# Patient Record
Sex: Female | Born: 1955
Health system: Southern US, Community
[De-identification: ages and names within clinical notes are randomized; demographics above are authoritative.]

## PROBLEM LIST (undated history)

## (undated) DIAGNOSIS — Z0000 Encounter for general adult medical examination without abnormal findings: Secondary | ICD-10-CM

## (undated) DIAGNOSIS — M1990 Unspecified osteoarthritis, unspecified site: Secondary | ICD-10-CM

## (undated) DIAGNOSIS — T4145XA Adverse effect of unspecified anesthetic, initial encounter: Secondary | ICD-10-CM

## (undated) DIAGNOSIS — E785 Hyperlipidemia, unspecified: Secondary | ICD-10-CM

## (undated) DIAGNOSIS — M797 Fibromyalgia: Secondary | ICD-10-CM

## (undated) DIAGNOSIS — K219 Gastro-esophageal reflux disease without esophagitis: Secondary | ICD-10-CM

## (undated) DIAGNOSIS — F32A Depression, unspecified: Secondary | ICD-10-CM

## (undated) DIAGNOSIS — M5430 Sciatica, unspecified side: Secondary | ICD-10-CM

## (undated) DIAGNOSIS — F329 Major depressive disorder, single episode, unspecified: Secondary | ICD-10-CM

## (undated) DIAGNOSIS — K649 Unspecified hemorrhoids: Secondary | ICD-10-CM

## (undated) DIAGNOSIS — I10 Essential (primary) hypertension: Secondary | ICD-10-CM

## (undated) DIAGNOSIS — T8859XA Other complications of anesthesia, initial encounter: Secondary | ICD-10-CM

## (undated) DIAGNOSIS — Z8719 Personal history of other diseases of the digestive system: Secondary | ICD-10-CM

## (undated) DIAGNOSIS — K760 Fatty (change of) liver, not elsewhere classified: Secondary | ICD-10-CM

## (undated) HISTORY — DX: Encounter for general adult medical examination without abnormal findings: Z00.00

## (undated) HISTORY — DX: Major depressive disorder, single episode, unspecified: F32.9

## (undated) HISTORY — PX: SPINE SURGERY: SHX786

## (undated) HISTORY — DX: Essential (primary) hypertension: I10

## (undated) HISTORY — DX: Fibromyalgia: M79.7

## (undated) HISTORY — PX: TONSILLECTOMY: SUR1361

## (undated) HISTORY — DX: Other complications of anesthesia, initial encounter: T88.59XA

## (undated) HISTORY — DX: Unspecified osteoarthritis, unspecified site: M19.90

## (undated) HISTORY — DX: Adverse effect of unspecified anesthetic, initial encounter: T41.45XA

## (undated) HISTORY — DX: Depression, unspecified: F32.A

## (undated) HISTORY — DX: Unspecified hemorrhoids: K64.9

## (undated) HISTORY — PX: ABDOMINAL HYSTERECTOMY: SHX81

## (undated) HISTORY — DX: Sciatica, unspecified side: M54.30

## (undated) HISTORY — DX: Hyperlipidemia, unspecified: E78.5

## (undated) HISTORY — PX: APPENDECTOMY: SHX54

## (undated) HISTORY — PX: RECTAL PROLAPSE REPAIR: SHX759

---

## 1978-06-08 HISTORY — PX: OVARIAN CYST SURGERY: SHX726

## 1985-06-08 HISTORY — PX: TOTAL ABDOMINAL HYSTERECTOMY: SHX209

## 1985-06-08 HISTORY — PX: OTHER SURGICAL HISTORY: SHX169

## 1995-06-09 HISTORY — PX: LUMBAR FUSION: SHX111

## 2000-10-07 ENCOUNTER — Encounter: Admission: RE | Admit: 2000-10-07 | Discharge: 2000-10-07 | Payer: Self-pay | Admitting: Neurosurgery

## 2000-10-07 ENCOUNTER — Encounter: Payer: Self-pay | Admitting: Neurosurgery

## 2000-10-26 ENCOUNTER — Encounter: Payer: Self-pay | Admitting: Neurosurgery

## 2000-10-26 ENCOUNTER — Encounter: Admission: RE | Admit: 2000-10-26 | Discharge: 2000-10-26 | Payer: Self-pay | Admitting: Neurosurgery

## 2000-11-09 ENCOUNTER — Encounter: Admission: RE | Admit: 2000-11-09 | Discharge: 2000-11-09 | Payer: Self-pay | Admitting: Neurosurgery

## 2000-11-09 ENCOUNTER — Encounter: Payer: Self-pay | Admitting: Neurosurgery

## 2000-11-21 ENCOUNTER — Ambulatory Visit (HOSPITAL_COMMUNITY): Admission: RE | Admit: 2000-11-21 | Discharge: 2000-11-21 | Payer: Self-pay | Admitting: Neurosurgery

## 2000-11-21 ENCOUNTER — Encounter: Payer: Self-pay | Admitting: Neurosurgery

## 2003-03-26 ENCOUNTER — Encounter: Payer: Self-pay | Admitting: Neurosurgery

## 2003-03-26 ENCOUNTER — Encounter: Admission: RE | Admit: 2003-03-26 | Discharge: 2003-03-26 | Payer: Self-pay | Admitting: Neurosurgery

## 2003-04-24 ENCOUNTER — Inpatient Hospital Stay (HOSPITAL_COMMUNITY): Admission: RE | Admit: 2003-04-24 | Discharge: 2003-04-27 | Payer: Self-pay | Admitting: Neurosurgery

## 2003-08-26 ENCOUNTER — Ambulatory Visit (HOSPITAL_COMMUNITY): Admission: RE | Admit: 2003-08-26 | Discharge: 2003-08-26 | Payer: Self-pay | Admitting: Neurosurgery

## 2003-10-09 ENCOUNTER — Inpatient Hospital Stay (HOSPITAL_COMMUNITY): Admission: RE | Admit: 2003-10-09 | Discharge: 2003-10-10 | Payer: Self-pay | Admitting: Neurosurgery

## 2004-03-31 ENCOUNTER — Emergency Department (HOSPITAL_COMMUNITY): Admission: EM | Admit: 2004-03-31 | Discharge: 2004-03-31 | Payer: Self-pay | Admitting: Family Medicine

## 2004-04-14 ENCOUNTER — Ambulatory Visit: Payer: Self-pay | Admitting: Internal Medicine

## 2004-04-21 ENCOUNTER — Ambulatory Visit: Payer: Self-pay | Admitting: Internal Medicine

## 2004-06-10 ENCOUNTER — Ambulatory Visit: Payer: Self-pay | Admitting: Internal Medicine

## 2004-06-13 ENCOUNTER — Ambulatory Visit: Payer: Self-pay | Admitting: Internal Medicine

## 2004-06-24 ENCOUNTER — Ambulatory Visit (HOSPITAL_COMMUNITY): Admission: RE | Admit: 2004-06-24 | Discharge: 2004-06-24 | Payer: Self-pay | Admitting: Internal Medicine

## 2004-09-22 ENCOUNTER — Ambulatory Visit: Payer: Self-pay | Admitting: Internal Medicine

## 2004-10-07 ENCOUNTER — Ambulatory Visit (HOSPITAL_COMMUNITY): Admission: RE | Admit: 2004-10-07 | Discharge: 2004-10-07 | Payer: Self-pay | Admitting: Specialist

## 2004-11-05 ENCOUNTER — Ambulatory Visit (HOSPITAL_BASED_OUTPATIENT_CLINIC_OR_DEPARTMENT_OTHER): Admission: RE | Admit: 2004-11-05 | Discharge: 2004-11-05 | Payer: Self-pay | Admitting: Specialist

## 2004-11-05 ENCOUNTER — Ambulatory Visit (HOSPITAL_COMMUNITY): Admission: RE | Admit: 2004-11-05 | Discharge: 2004-11-05 | Payer: Self-pay | Admitting: Specialist

## 2004-11-18 ENCOUNTER — Encounter: Admission: RE | Admit: 2004-11-18 | Discharge: 2004-11-18 | Payer: Self-pay | Admitting: Specialist

## 2004-12-10 ENCOUNTER — Ambulatory Visit: Payer: Self-pay | Admitting: Internal Medicine

## 2004-12-25 ENCOUNTER — Ambulatory Visit: Payer: Self-pay | Admitting: Internal Medicine

## 2005-01-14 ENCOUNTER — Ambulatory Visit: Payer: Self-pay | Admitting: Internal Medicine

## 2005-07-03 ENCOUNTER — Ambulatory Visit: Payer: Self-pay | Admitting: Internal Medicine

## 2005-07-17 ENCOUNTER — Ambulatory Visit: Payer: Self-pay | Admitting: Internal Medicine

## 2005-07-20 ENCOUNTER — Ambulatory Visit (HOSPITAL_COMMUNITY): Admission: RE | Admit: 2005-07-20 | Discharge: 2005-07-20 | Payer: Self-pay | Admitting: Internal Medicine

## 2005-08-19 ENCOUNTER — Ambulatory Visit: Payer: Self-pay | Admitting: Internal Medicine

## 2006-03-11 ENCOUNTER — Ambulatory Visit: Payer: Self-pay | Admitting: Internal Medicine

## 2006-07-01 ENCOUNTER — Telehealth: Payer: Self-pay | Admitting: *Deleted

## 2006-07-22 ENCOUNTER — Telehealth (INDEPENDENT_AMBULATORY_CARE_PROVIDER_SITE_OTHER): Payer: Self-pay | Admitting: *Deleted

## 2006-07-27 ENCOUNTER — Ambulatory Visit (HOSPITAL_COMMUNITY): Admission: RE | Admit: 2006-07-27 | Discharge: 2006-07-27 | Payer: Self-pay | Admitting: Internal Medicine

## 2006-08-11 ENCOUNTER — Encounter: Admission: RE | Admit: 2006-08-11 | Discharge: 2006-09-01 | Payer: Self-pay | Admitting: Orthopedic Surgery

## 2006-08-18 ENCOUNTER — Telehealth: Payer: Self-pay | Admitting: *Deleted

## 2006-09-23 ENCOUNTER — Ambulatory Visit: Payer: Self-pay | Admitting: Internal Medicine

## 2006-09-23 ENCOUNTER — Encounter (INDEPENDENT_AMBULATORY_CARE_PROVIDER_SITE_OTHER): Payer: Self-pay | Admitting: Pulmonary Disease

## 2006-09-23 DIAGNOSIS — M1990 Unspecified osteoarthritis, unspecified site: Secondary | ICD-10-CM | POA: Insufficient documentation

## 2006-09-23 DIAGNOSIS — Z9079 Acquired absence of other genital organ(s): Secondary | ICD-10-CM | POA: Insufficient documentation

## 2006-09-23 DIAGNOSIS — E782 Mixed hyperlipidemia: Secondary | ICD-10-CM | POA: Insufficient documentation

## 2006-09-23 DIAGNOSIS — I10 Essential (primary) hypertension: Secondary | ICD-10-CM | POA: Insufficient documentation

## 2006-09-23 LAB — CONVERTED CEMR LAB
CO2: 26 meq/L (ref 19–32)
Calcium: 9.4 mg/dL (ref 8.4–10.5)
Glucose, Bld: 97 mg/dL (ref 70–99)
Potassium: 4.2 meq/L (ref 3.5–5.3)
Sodium: 142 meq/L (ref 135–145)

## 2006-09-30 ENCOUNTER — Encounter (INDEPENDENT_AMBULATORY_CARE_PROVIDER_SITE_OTHER): Payer: Self-pay | Admitting: Pulmonary Disease

## 2006-09-30 ENCOUNTER — Ambulatory Visit: Payer: Self-pay | Admitting: Internal Medicine

## 2006-09-30 LAB — CONVERTED CEMR LAB
HDL: 47 mg/dL (ref >39)
Total CHOL/HDL Ratio: 3.1
VLDL: 24 mg/dL (ref 0–40)

## 2006-10-07 ENCOUNTER — Telehealth (INDEPENDENT_AMBULATORY_CARE_PROVIDER_SITE_OTHER): Payer: Self-pay | Admitting: Pulmonary Disease

## 2006-10-07 ENCOUNTER — Ambulatory Visit: Payer: Self-pay | Admitting: Internal Medicine

## 2006-10-07 ENCOUNTER — Encounter (INDEPENDENT_AMBULATORY_CARE_PROVIDER_SITE_OTHER): Payer: Self-pay | Admitting: Pulmonary Disease

## 2006-10-07 LAB — CONVERTED CEMR LAB
BUN: 23 mg/dL (ref 6–23)
Calcium: 9.9 mg/dL (ref 8.4–10.5)
Glucose, Bld: 106 mg/dL — ABNORMAL HIGH (ref 70–99)
Sodium: 141 meq/L (ref 135–145)

## 2006-12-27 ENCOUNTER — Telehealth (INDEPENDENT_AMBULATORY_CARE_PROVIDER_SITE_OTHER): Payer: Self-pay | Admitting: *Deleted

## 2007-01-28 ENCOUNTER — Telehealth: Payer: Self-pay | Admitting: *Deleted

## 2007-02-03 ENCOUNTER — Encounter (INDEPENDENT_AMBULATORY_CARE_PROVIDER_SITE_OTHER): Payer: Self-pay | Admitting: *Deleted

## 2007-02-18 ENCOUNTER — Encounter (INDEPENDENT_AMBULATORY_CARE_PROVIDER_SITE_OTHER): Payer: Self-pay | Admitting: *Deleted

## 2007-02-18 ENCOUNTER — Ambulatory Visit: Payer: Self-pay | Admitting: Internal Medicine

## 2007-02-18 LAB — CONVERTED CEMR LAB
ALT: 16 units/L (ref 0–35)
Albumin: 4.9 g/dL (ref 3.5–5.2)
Alkaline Phosphatase: 56 units/L (ref 39–117)
CO2: 27 meq/L (ref 19–32)
Potassium: 3.7 meq/L (ref 3.5–5.3)
Sodium: 140 meq/L (ref 135–145)
Total Bilirubin: 0.5 mg/dL (ref 0.3–1.2)
Total Protein: 7.7 g/dL (ref 6.0–8.3)

## 2007-02-25 ENCOUNTER — Ambulatory Visit: Payer: Self-pay | Admitting: Hospitalist

## 2007-02-25 ENCOUNTER — Encounter (INDEPENDENT_AMBULATORY_CARE_PROVIDER_SITE_OTHER): Payer: Self-pay | Admitting: *Deleted

## 2007-03-01 LAB — CONVERTED CEMR LAB
LDL Cholesterol: 64 mg/dL (ref 0–99)
VLDL: 20 mg/dL (ref 0–40)

## 2007-05-09 ENCOUNTER — Encounter (INDEPENDENT_AMBULATORY_CARE_PROVIDER_SITE_OTHER): Payer: Self-pay | Admitting: *Deleted

## 2007-05-09 ENCOUNTER — Ambulatory Visit: Payer: Self-pay | Admitting: Hospitalist

## 2007-05-09 LAB — CONVERTED CEMR LAB
ALT: 17 units/L (ref 0–35)
AST: 21 units/L (ref 0–37)
Albumin: 4.8 g/dL (ref 3.5–5.2)
Calcium: 9.9 mg/dL (ref 8.4–10.5)
Chloride: 103 meq/L (ref 96–112)
Potassium: 4.3 meq/L (ref 3.5–5.3)
Total Protein: 7.7 g/dL (ref 6.0–8.3)

## 2007-05-13 ENCOUNTER — Ambulatory Visit: Payer: Self-pay | Admitting: Hospitalist

## 2007-05-13 ENCOUNTER — Encounter (INDEPENDENT_AMBULATORY_CARE_PROVIDER_SITE_OTHER): Payer: Self-pay | Admitting: *Deleted

## 2007-05-13 LAB — CONVERTED CEMR LAB
BUN: 20 mg/dL (ref 6–23)
CO2: 26 meq/L (ref 19–32)
Calcium: 9.1 mg/dL (ref 8.4–10.5)
Creatinine, Ser: 0.81 mg/dL (ref 0.40–1.20)
Glucose, Bld: 79 mg/dL (ref 70–99)

## 2007-05-16 ENCOUNTER — Encounter (INDEPENDENT_AMBULATORY_CARE_PROVIDER_SITE_OTHER): Payer: Self-pay | Admitting: *Deleted

## 2007-05-31 ENCOUNTER — Telehealth: Payer: Self-pay | Admitting: *Deleted

## 2007-08-01 ENCOUNTER — Ambulatory Visit (HOSPITAL_COMMUNITY): Admission: RE | Admit: 2007-08-01 | Discharge: 2007-08-01 | Payer: Self-pay | Admitting: Gynecology

## 2007-08-15 ENCOUNTER — Encounter (INDEPENDENT_AMBULATORY_CARE_PROVIDER_SITE_OTHER): Payer: Self-pay | Admitting: *Deleted

## 2007-08-15 ENCOUNTER — Ambulatory Visit: Payer: Self-pay | Admitting: *Deleted

## 2007-08-15 LAB — CONVERTED CEMR LAB
ALT: 21 units/L (ref 0–35)
CO2: 27 meq/L (ref 19–32)
Creatinine, Ser: 0.85 mg/dL (ref 0.40–1.20)
Total Bilirubin: 0.6 mg/dL (ref 0.3–1.2)

## 2007-09-13 ENCOUNTER — Encounter (INDEPENDENT_AMBULATORY_CARE_PROVIDER_SITE_OTHER): Payer: Self-pay | Admitting: *Deleted

## 2007-09-13 ENCOUNTER — Ambulatory Visit (HOSPITAL_COMMUNITY): Admission: RE | Admit: 2007-09-13 | Discharge: 2007-09-13 | Payer: Self-pay | Admitting: *Deleted

## 2007-09-19 ENCOUNTER — Ambulatory Visit: Payer: Self-pay | Admitting: Gastroenterology

## 2007-10-03 ENCOUNTER — Ambulatory Visit: Payer: Self-pay | Admitting: Gastroenterology

## 2007-10-03 ENCOUNTER — Encounter: Payer: Self-pay | Admitting: Internal Medicine

## 2007-10-03 LAB — HM COLONOSCOPY: HM Colonoscopy: NORMAL

## 2007-10-19 ENCOUNTER — Telehealth (INDEPENDENT_AMBULATORY_CARE_PROVIDER_SITE_OTHER): Payer: Self-pay | Admitting: *Deleted

## 2007-10-25 ENCOUNTER — Telehealth (INDEPENDENT_AMBULATORY_CARE_PROVIDER_SITE_OTHER): Payer: Self-pay | Admitting: *Deleted

## 2007-11-08 ENCOUNTER — Encounter (INDEPENDENT_AMBULATORY_CARE_PROVIDER_SITE_OTHER): Payer: Self-pay | Admitting: *Deleted

## 2007-11-08 ENCOUNTER — Ambulatory Visit: Payer: Self-pay | Admitting: Internal Medicine

## 2007-11-08 LAB — CONVERTED CEMR LAB
AST: 23 units/L (ref 0–37)
Alkaline Phosphatase: 58 units/L (ref 39–117)
BUN: 13 mg/dL (ref 6–23)
Creatinine, Ser: 0.77 mg/dL (ref 0.40–1.20)
Potassium: 4.2 meq/L (ref 3.5–5.3)
Total Bilirubin: 0.4 mg/dL (ref 0.3–1.2)

## 2007-12-12 ENCOUNTER — Encounter: Payer: Self-pay | Admitting: Internal Medicine

## 2007-12-19 ENCOUNTER — Encounter: Admission: RE | Admit: 2007-12-19 | Discharge: 2007-12-19 | Payer: Self-pay | Admitting: Neurosurgery

## 2008-01-13 ENCOUNTER — Encounter: Payer: Self-pay | Admitting: Internal Medicine

## 2008-02-23 ENCOUNTER — Encounter: Payer: Self-pay | Admitting: Internal Medicine

## 2008-02-23 ENCOUNTER — Ambulatory Visit: Payer: Self-pay | Admitting: Internal Medicine

## 2008-04-05 LAB — CONVERTED CEMR LAB
HDL: 59 mg/dL (ref >39)
LDL Cholesterol: 93 mg/dL (ref 0–99)
Total CHOL/HDL Ratio: 3.2
Triglycerides: 168 mg/dL — ABNORMAL HIGH (ref <150)
VLDL: 34 mg/dL (ref 0–40)

## 2008-04-25 ENCOUNTER — Encounter: Payer: Self-pay | Admitting: Internal Medicine

## 2008-06-08 HISTORY — PX: CERVICAL FUSION: SHX112

## 2008-07-25 ENCOUNTER — Ambulatory Visit: Payer: Self-pay | Admitting: Internal Medicine

## 2008-07-25 ENCOUNTER — Encounter: Payer: Self-pay | Admitting: Internal Medicine

## 2008-08-02 ENCOUNTER — Encounter: Payer: Self-pay | Admitting: Internal Medicine

## 2008-08-02 ENCOUNTER — Ambulatory Visit (HOSPITAL_COMMUNITY): Admission: RE | Admit: 2008-08-02 | Discharge: 2008-08-02 | Payer: Self-pay | Admitting: Family Medicine

## 2008-08-08 ENCOUNTER — Encounter: Payer: Self-pay | Admitting: Internal Medicine

## 2008-08-08 ENCOUNTER — Encounter (INDEPENDENT_AMBULATORY_CARE_PROVIDER_SITE_OTHER): Payer: Self-pay | Admitting: *Deleted

## 2008-08-08 ENCOUNTER — Ambulatory Visit: Payer: Self-pay | Admitting: *Deleted

## 2008-08-08 LAB — CONVERTED CEMR LAB
Calcium: 9.4 mg/dL (ref 8.4–10.5)
Creatinine, Ser: 0.77 mg/dL (ref 0.40–1.20)
Sodium: 141 meq/L (ref 135–145)

## 2008-09-07 ENCOUNTER — Telehealth: Payer: Self-pay | Admitting: Internal Medicine

## 2008-10-04 ENCOUNTER — Ambulatory Visit: Payer: Self-pay | Admitting: Infectious Disease

## 2008-11-26 ENCOUNTER — Encounter: Payer: Self-pay | Admitting: Internal Medicine

## 2008-12-14 ENCOUNTER — Telehealth: Payer: Self-pay | Admitting: Internal Medicine

## 2008-12-21 ENCOUNTER — Ambulatory Visit: Payer: Self-pay | Admitting: Internal Medicine

## 2008-12-21 ENCOUNTER — Encounter: Payer: Self-pay | Admitting: Internal Medicine

## 2008-12-21 LAB — CONVERTED CEMR LAB
ALT: 22 units/L (ref 0–35)
AST: 36 units/L (ref 0–37)
CO2: 24 meq/L (ref 19–32)
Calcium: 9.9 mg/dL (ref 8.4–10.5)
Chloride: 105 meq/L (ref 96–112)
Cholesterol: 159 mg/dL (ref 0–200)
HCT: 45.9 % (ref 36.0–46.0)
HDL: 53 mg/dL (ref >39)
MCV: 96.8 fL (ref 78.0–100.0)
Platelets: 198 10*3/uL (ref 150–400)
RDW: 12.8 % (ref 11.5–15.5)
Sodium: 143 meq/L (ref 135–145)
TSH: 1.037 microintl units/mL (ref 0.350–4.500)
Total CHOL/HDL Ratio: 3
Total Protein: 7.6 g/dL (ref 6.0–8.3)
WBC: 7.2 10*3/uL (ref 4.0–10.5)

## 2009-01-30 ENCOUNTER — Telehealth: Payer: Self-pay | Admitting: Internal Medicine

## 2009-03-13 ENCOUNTER — Encounter: Payer: Self-pay | Admitting: Internal Medicine

## 2009-04-12 ENCOUNTER — Telehealth: Payer: Self-pay | Admitting: Internal Medicine

## 2009-05-16 ENCOUNTER — Ambulatory Visit: Payer: Self-pay | Admitting: Infectious Diseases

## 2009-05-16 DIAGNOSIS — M797 Fibromyalgia: Secondary | ICD-10-CM | POA: Insufficient documentation

## 2009-05-16 DIAGNOSIS — IMO0001 Reserved for inherently not codable concepts without codable children: Secondary | ICD-10-CM

## 2009-06-19 ENCOUNTER — Encounter: Payer: Self-pay | Admitting: Internal Medicine

## 2009-07-12 ENCOUNTER — Telehealth: Payer: Self-pay | Admitting: Internal Medicine

## 2009-07-15 ENCOUNTER — Telehealth (INDEPENDENT_AMBULATORY_CARE_PROVIDER_SITE_OTHER): Payer: Self-pay | Admitting: *Deleted

## 2009-07-30 ENCOUNTER — Telehealth: Payer: Self-pay | Admitting: Internal Medicine

## 2009-08-05 ENCOUNTER — Ambulatory Visit (HOSPITAL_COMMUNITY): Admission: RE | Admit: 2009-08-05 | Discharge: 2009-08-05 | Payer: Self-pay | Admitting: Internal Medicine

## 2009-09-17 ENCOUNTER — Telehealth: Payer: Self-pay | Admitting: Internal Medicine

## 2009-10-07 ENCOUNTER — Encounter: Payer: Self-pay | Admitting: Internal Medicine

## 2009-10-23 ENCOUNTER — Ambulatory Visit: Payer: Self-pay | Admitting: Infectious Disease

## 2009-10-23 DIAGNOSIS — F341 Dysthymic disorder: Secondary | ICD-10-CM | POA: Insufficient documentation

## 2009-10-23 LAB — CONVERTED CEMR LAB
ALT: 30 units/L (ref 0–35)
CO2: 25 meq/L (ref 19–32)
Calcium: 9.7 mg/dL (ref 8.4–10.5)
Chloride: 101 meq/L (ref 96–112)
Glucose, Bld: 97 mg/dL (ref 70–99)
Sodium: 140 meq/L (ref 135–145)
TSH: 1.439 microintl units/mL (ref 0.350–4.5)
Total Bilirubin: 0.3 mg/dL (ref 0.3–1.2)
Total Protein: 7.1 g/dL (ref 6.0–8.3)

## 2009-10-24 ENCOUNTER — Telehealth: Payer: Self-pay | Admitting: Internal Medicine

## 2009-10-24 ENCOUNTER — Telehealth: Payer: Self-pay | Admitting: *Deleted

## 2009-11-07 ENCOUNTER — Telehealth: Payer: Self-pay | Admitting: Internal Medicine

## 2009-11-26 ENCOUNTER — Telehealth: Payer: Self-pay | Admitting: Internal Medicine

## 2010-01-23 ENCOUNTER — Ambulatory Visit: Payer: Self-pay | Admitting: Internal Medicine

## 2010-01-23 DIAGNOSIS — M5430 Sciatica, unspecified side: Secondary | ICD-10-CM | POA: Insufficient documentation

## 2010-01-23 LAB — CONVERTED CEMR LAB
ALT: 31 units/L (ref 0–35)
AST: 30 units/L (ref 0–37)
Alkaline Phosphatase: 72 units/L (ref 39–117)
CO2: 26 meq/L (ref 19–32)
Creatinine, Ser: 0.81 mg/dL (ref 0.40–1.20)
HCT: 45.9 % (ref 36.0–46.0)
MCHC: 34 g/dL (ref 30.0–36.0)
MCV: 97 fL (ref >=78.0)
Sodium: 141 meq/L (ref 135–145)
Total Bilirubin: 0.6 mg/dL (ref 0.3–1.2)
Total Protein: 7.4 g/dL (ref 6.0–8.3)

## 2010-01-27 ENCOUNTER — Encounter: Payer: Self-pay | Admitting: Internal Medicine

## 2010-01-30 ENCOUNTER — Encounter: Admission: RE | Admit: 2010-01-30 | Discharge: 2010-01-30 | Payer: Self-pay | Admitting: Neurosurgery

## 2010-02-08 ENCOUNTER — Emergency Department (HOSPITAL_COMMUNITY): Admission: EM | Admit: 2010-02-08 | Discharge: 2010-02-09 | Payer: Self-pay | Admitting: Emergency Medicine

## 2010-02-08 ENCOUNTER — Emergency Department (HOSPITAL_COMMUNITY): Admission: EM | Admit: 2010-02-08 | Discharge: 2010-02-08 | Payer: Self-pay

## 2010-02-08 ENCOUNTER — Encounter (INDEPENDENT_AMBULATORY_CARE_PROVIDER_SITE_OTHER): Payer: Self-pay | Admitting: *Deleted

## 2010-02-09 ENCOUNTER — Encounter (INDEPENDENT_AMBULATORY_CARE_PROVIDER_SITE_OTHER): Payer: Self-pay | Admitting: *Deleted

## 2010-02-11 ENCOUNTER — Telehealth: Payer: Self-pay | Admitting: Gastroenterology

## 2010-02-13 ENCOUNTER — Ambulatory Visit: Payer: Self-pay | Admitting: Gastroenterology

## 2010-02-13 DIAGNOSIS — R935 Abnormal findings on diagnostic imaging of other abdominal regions, including retroperitoneum: Secondary | ICD-10-CM | POA: Insufficient documentation

## 2010-02-19 ENCOUNTER — Ambulatory Visit: Payer: Self-pay | Admitting: Internal Medicine

## 2010-02-20 ENCOUNTER — Ambulatory Visit: Payer: Self-pay | Admitting: Gastroenterology

## 2010-02-24 ENCOUNTER — Encounter (INDEPENDENT_AMBULATORY_CARE_PROVIDER_SITE_OTHER): Payer: Self-pay | Admitting: *Deleted

## 2010-02-25 ENCOUNTER — Encounter: Payer: Self-pay | Admitting: Gastroenterology

## 2010-02-28 ENCOUNTER — Telehealth: Payer: Self-pay | Admitting: Gastroenterology

## 2010-03-19 ENCOUNTER — Telehealth (INDEPENDENT_AMBULATORY_CARE_PROVIDER_SITE_OTHER): Payer: Self-pay | Admitting: *Deleted

## 2010-03-31 ENCOUNTER — Ambulatory Visit: Payer: Self-pay | Admitting: Gastroenterology

## 2010-05-23 ENCOUNTER — Encounter: Payer: Self-pay | Admitting: Internal Medicine

## 2010-05-28 ENCOUNTER — Telehealth (INDEPENDENT_AMBULATORY_CARE_PROVIDER_SITE_OTHER): Payer: Self-pay

## 2010-06-24 ENCOUNTER — Telehealth: Payer: Self-pay | Admitting: Internal Medicine

## 2010-07-04 ENCOUNTER — Other Ambulatory Visit (HOSPITAL_COMMUNITY): Payer: Self-pay | Admitting: Emergency Medicine

## 2010-07-04 DIAGNOSIS — Z1239 Encounter for other screening for malignant neoplasm of breast: Secondary | ICD-10-CM

## 2010-07-04 DIAGNOSIS — Z1231 Encounter for screening mammogram for malignant neoplasm of breast: Secondary | ICD-10-CM

## 2010-07-10 NOTE — Letter (Signed)
Summary: Results Letter  [facility]  [address] [place], [place] [address]   Phone: [phone]  Fax: [phone]        February 25, 2010 MRN: [id number]    Misty Decker [address] [place], [place]  [address]    Dear Ms. Goin,   Your biopsies demonstrated inflammatory changes only.    Please follow the recommendations previously discussed.  Should you have any immediate concerns or questions, feel free to contact me at the office.    Sincerely,  Barbette Hair. Arlyce Dice, M.D., [facility]          Sincerely,  Louis Meckel MD  This letter has been electronically signed by your physician.  Appended Document: Results Letter letter mailed.

## 2010-07-10 NOTE — Procedures (Signed)
Summary: Upper Endoscopy  Patient: Misty Decker Note: All result statuses are Final unless otherwise noted.  Tests: (1) Upper Endoscopy (EGD)   EGD Upper Endoscopy       DONE     [facility]     [address]     [place], [place]  [address]           ENDOSCOPY PROCEDURE REPORT           PATIENT:  Misty, Decker  MR#:  [id number]     BIRTHDATE:  Dec 24, 1955, 53 yrs. old  GENDER:  female           ENDOSCOPIST:  Barbette Hair. Arlyce Dice, MD     Referred by:           PROCEDURE DATE:  02/20/2010     PROCEDURE:  EGD with biopsy     ASA CLASS:  Class II     INDICATIONS:  abdominal pain, abnormal imaging CT shows thickened     gastric wall           MEDICATIONS:   Fentanyl 50 mcg IV, Versed 5 mg IV, glycopyrrolate     (Robinal) 0.2 mg IV, 0.6cc simethancone 0.6 cc PO     TOPICAL ANESTHETIC:  Exactacain Spray           DESCRIPTION OF PROCEDURE:   After the risks benefits and     alternatives of the procedure were thoroughly explained, informed     consent was obtained.  The LB GIF-H180 [id number] endoscope was     introduced through the mouth and advanced to the third portion of     the duodenum, without limitations.  The instrument was slowly     withdrawn as the mucosa was fully examined.     <<PROCEDUREIMAGES>>           Multiple erosions were found in the antrum. Multiple superficial     erosions and moderate erythema in antrum. Bxs taken (see image1,     image2, image3, and image4).  Otherwise the examination was     normal.    Retroflexed views revealed no abnormalities.    The     scope was then withdrawn from the patient and the procedure     completed.           COMPLICATIONS:  None           ENDOSCOPIC IMPRESSION:     1) Erosions, multiple in the antrum - probably NSAID-related     2) Otherwise normal examination     RECOMMENDATIONS:     1) continue current medications; hold NSAIDS     2) Call office next 2-3 days to schedule an office appointment     for 1  month           REPEAT EXAM:  No           ______________________________     Barbette Hair. Arlyce Dice, MD           CC:  Gearlean Alf MD           n.     Misty DoctorBarbette Hair. Kaplan at 02/20/2010 03:22 PM           Misty Decker, [id number]  Note: An exclamation mark (!) indicates a result that was not dispersed into the flowsheet. Document Creation Date: 02/20/2010 3:23 PM _______________________________________________________________________  (1) Order result status: Final Collection or observation date-time: 02/20/2010 15:15 Requested date-time:  Receipt  date-time:  Reported date-time:  Referring Physician:   Ordering Physician: Melvia Heaps [phone]) Specimen Source:  Source: Launa Grill Order Number: [id number] Lab site:

## 2010-07-10 NOTE — Progress Notes (Signed)
Summary: med refill/gp  Phone Note Refill Request Message from:  Fax from Pharmacy on March 19, 2010 1:44 PM  Refills Requested: Medication #1:  NEURONTIN 300 MG CAPS Take 2  tablets by mouth three times a day   Last Refilled: 02/18/2010  Medication #2:  AMITRIPTYLINE HCL 50 MG TABS Take 1 tablet by mouth once at bedtime   Last Refilled: 03/21/2010 Last appt. 8/18.   Method Requested: Electronic Initial call taken by: Chinita Pester RN,  March 19, 2010 1:44 PM    Prescriptions: AMITRIPTYLINE HCL 50 MG TABS (AMITRIPTYLINE HCL) Take 1 tablet by mouth once at bedtime  #30 x 3   Entered and Authorized by:   Zoila Shutter MD   Signed by:   Zoila Shutter MD on 03/20/2010   Method used:   [facility]        [facility] [address] [phone]* (retail)       [address]       [place], [place]  [address]       Ph: [phone] or [phone]       Fax: [phone]   RxID:   [id number] NEURONTIN 300 MG CAPS (GABAPENTIN) Take 2  tablets by mouth three times a day  #180 Capsule x 3   Entered and Authorized by:   Zoila Shutter MD   Signed by:   Zoila Shutter MD on 03/20/2010   Method used:   [facility]        [facility]  [address] [phone]* (retail)       [address]       [place], [place]  [id number]       Ph: [phone] or [phone]       Fax: [phone]   RxID:   [id number]

## 2010-07-10 NOTE — Consult Note (Signed)
Summary: [facility]   [facility]   Imported By: Margie Billet 06/09/2010 14:00:52  _____________________________________________________________________  External Attachment:    Type:   Image     Comment:   External Document

## 2010-07-10 NOTE — Progress Notes (Signed)
Summary: Patient Update   Phone Note Other Incoming   Summary of Call: Patient called looking for Milford Cage but call was sent to me. Patient wanted  to let us know that the Omeprazole is working and her stomach is feeling better. I advised patient to continue to take the Omeprazole and to come for her one month follow up appointment and to call back if she has any other problems. Initial call taken by: Ok Anis CMA,  February 28, 2010 10:38 AM

## 2010-07-10 NOTE — Consult Note (Signed)
Summary: [facility]  [facility]   Imported By: Florinda Marker 07/02/2009 14:38:41  _____________________________________________________________________  External Attachment:    Type:   Image     Comment:   External Document

## 2010-07-10 NOTE — Progress Notes (Signed)
Summary: REfill/gh  Phone Note Refill Request Message from:  Pharmacy on July 12, 2009 12:00 PM  Refills Requested: Medication #1:  HYDROCHLOROTHIAZIDE 25 MG TABS take 1 tablet by mouth once daily   Last Refilled: 06/13/2009  Medication #2:  NEURONTIN 300 MG CAPS Take 2  tablets by mouth three times a day   Last Refilled: 12/31/2008  Method Requested: Electronic Initial call taken by: Angelina Ok RN,  July 12, 2009 12:00 PM    Prescriptions: NEURONTIN 300 MG CAPS (GABAPENTIN) Take 2  tablets by mouth three times a day  #180 Capsule x 3   Entered and Authorized by:   Laren Everts MD   Signed by:   Laren Everts MD on 07/12/2009   Method used:   [facility]        [facility]  [address] [phone][facility] (retail)       [address]       [place], [place]  [id number]       Ph: [phone] or [phone]       Fax: [phone]   RxID:   [id number] HYDROCHLOROTHIAZIDE 25 MG TABS (HYDROCHLOROTHIAZIDE) take 1 tablet by mouth once daily  #31 x 6   Entered and Authorized by:   Laren Everts MD   Signed by:   Laren Everts MD on 07/12/2009   Method used:   [facility]        [facility]  [address] [phone]* (retail)       [address]       [place], [place]  [address]       Ph: [phone] or [phone]       Fax: [phone]   RxID:   [id number]

## 2010-07-10 NOTE — Assessment & Plan Note (Signed)
Summary: abd. pain /seen in Er.   History of Present Illness Visit Type: Initial Visit Primary GI MD: Melvia Heaps MD [facility] Primary Provider: Laren Everts MD Chief Complaint: abdominal pain post E.R. visit History of Present Illness:   55 YO FEMALE KNOWN TO DR. KAPLAN. SHE HAD A COLONOSCOPY IN 4/09 FOR SCREENING WHICH WAS NORMAL.  SHE COMES IN NOW WITH  ACUTE ABDOMINAL PAIN ONSET AROUND 02/09/10. SHE WENT TO THE E.R. ON 9/5 . KUB WAS NEGATIVE EXCEPT INCREASED STOOL. LABS UNREMARKABLE.CT ABDOMEN/PELVIS  WAS NEGATIVE EXCEPT FOR GASTRIC ANTRAL WALL THICKENING.  PT DESCRIBES PAIN AS NAGGING,CRAMPY EPIGASTRIC PAIN,,NAUSEA,NO VOMITING. APPETITE DECREASED AND EATING VERY BLAND. BM'S NORMAL, NO MELENA OR HEME.  SHE HAD BEEN ON MELOXICAM, AND ON 8/18 STARTED NAPROXEN INSTEAD. SHE WAS TOLD TO START ON PRILOSEC VIA THE ER, BUT SAYS UNABLE TO AFFORD IT OTC. NO ETOH USE.   GI Review of Systems    Reports abdominal pain, acid reflux, belching, bloating, dysphagia with liquids, and  nausea.     Location of  Abdominal pain: upper abdomen.    Denies chest pain, dysphagia with solids, heartburn, loss of appetite, vomiting, vomiting blood, weight loss, and  weight gain.        Denies anal fissure, black tarry stools, change in bowel habit, constipation, diarrhea, diverticulosis, fecal incontinence, heme positive stool, hemorrhoids, irritable bowel syndrome, jaundice, light color stool, liver problems, rectal bleeding, and  rectal pain. Preventive Screening-Counseling & Management  Alcohol-Tobacco     Smoking Status: quit      Drug Use:  no.      Current Medications (verified): 1)  Neurontin 300 Mg Caps (Gabapentin) .... Take 2  Tablets By Mouth Three Times A Day 2)  Hydrochlorothiazide 25 Mg Tabs (Hydrochlorothiazide) .... Take 1 Tablet By Mouth Once Daily 3)  Flexeril 5 Mg Tabs (Cyclobenzaprine Hcl) .... Take 1 Tablet By Mouth At Bedtime As Needed 4)  Multivitamins  Tabs (Multiple Vitamin)  .... Take 1 By Mouth Once Daily 5)  Fish Oil 300 Mg Caps (Omega-3 Fatty Acids) .... Take 1 Tablet By Mouth Once Daily 6)  Calcium-Vitamin D 250-125 Mg-Unit Tabs (Calcium Carbonate-Vitamin D) .... Take 1 Tablet By Mouth Three Times A Day 7)  Lovastatin 20 Mg Tabs (Lovastatin) .... Take 1 Tablet By Mouth Once Daily 8)  Meloxicam 15 Mg  Tabs (Meloxicam) .... Take 1 Tablet By Mouth Once A Day With Food. 9)  Amitriptyline Hcl 50 Mg Tabs (Amitriptyline Hcl) .... Take 1 Tablet By Mouth Once At Bedtime 10)  Co Q-10 150 Mg Caps (Coenzyme Q10) .Marland Kitchen.. 1 By Mouth Once Daily 11)  Vitamin E .... 1 By Mouth Once Daily  Allergies: 1)  ! Codeine 2)  ! Pcn 3)  ! Morphine 4)  ! * Latex 5)  ! * Tramadol  Past History:  Past Medical History: Degenerative Joint Disease-S/P LUMBAR AND CERVICAL FUSIONS Hypertension Hyperlipidemia  Past Surgical History: Cervical fusion x3 Lumbar fusion x 2 Hysterectomy Tonsillectomy Rectal Repair  Family History: Reviewed history and no changes required. No FH of Colon Cancer: Family History of Breast Cancer:mother Family History of Diabetes:   Social History: Reviewed history and no changes required. Married 2 girls Disabled Patient is a former smoker.  quit 2004 Alcohol Use - no Illicit Drug Use - no Drug Use:  no  Review of Systems       The patient complains of arthritis/joint pain and back pain.  The patient denies allergy/sinus, anemia, anxiety-new, blood in urine,  breast changes/lumps, change in vision, confusion, cough, coughing up blood, depression-new, fainting, fatigue, fever, headaches-new, hearing problems, heart murmur, heart rhythm changes, itching, menstrual pain, muscle pains/cramps, night sweats, nosebleeds, pregnancy symptoms, shortness of breath, skin rash, sleeping problems, sore throat, swelling of feet/legs, swollen lymph glands, thirst - excessive , urination - excessive , urination changes/pain, urine leakage, vision changes, and voice  change.         SEE HPI  Vital Signs:  Patient profile:   55 year old female Height:      63.5 inches Weight:      180 pounds BMI:     31.50 Pulse rate:   76 / minute Pulse rhythm:   irregular BP sitting:   116 / 74  (left arm)  Vitals Entered By: Milford Cage NCMA (February 13, 2010 10:15 AM)  Physical Exam  General:  Well developed, well nourished, no acute distress. Head:  Normocephalic and atraumatic. Eyes:  PERRLA, no icterus. Lungs:  Clear throughout to auscultation. Heart:  Regular rate and rhythm; no murmurs, rubs,  or bruits. Abdomen:  SOFT, TENDER EPIGASTRIUM, NO GUARDING OR REBOUND, NO MASS OR HSM,BS+ Rectal:  NOT DONE Extremities:  No clubbing, cyanosis, edema or deformities noted. Neurologic:  Alert and  oriented x4;  grossly normal neurologically. Psych:  Alert and cooperative. Normal mood and affect.   Impression & Recommendations:  Problem # 1:  ABDOMINAL PAIN, EPIGASTRIC (ICD-789.06) Assessment New 55 YO FEMALE WITH 5 DAY HX OF EPIGASTRIC PAIN AND NAUSEA,ABNORMAL CT OF ABDOMEN 02/10/10 WITH GASTRIC ANTRAL THICKENING. R/O NSAID INDUCED PUD,GASTRIC LESION.  START PRILOSEC 20 MG TWICE DAILY X 2 WEEKS (SAMPLES GIVEN),THEN START RX FOR PRILOSEC 20 MG DAILY IN AM SCHEDULE FOR UPPER ENDOSCOPY WITH DR. KAPLAN. PROCEDURE DISCUSSED IN DETAIL WITH PT AND SHE IS AGREEABLE. STOP NSAID USE Orders: EGD (EGD)  Problem # 2:  ANXIETY DEPRESSION (ICD-300.4) Assessment: Comment Only  Problem # 3:  HYPERLIPIDEMIA (ICD-272.4) Assessment: Comment Only  Problem # 4:  HYPERTENSION, ESSENTIAL NOS (ICD-401.9) Assessment: Comment Only  Problem # 5:  DEGENERATIVE JOINT DISEASE (ICD-715.90) Assessment: Comment Only  Patient Instructions: 1)  We have given  you samples off Prilosec 20 mg twice daily, 30 min prior to breakfast and dinner. Take twice daily until the procedure until 02-20-10. 2)  I will sent refills for the Omeprazole. 3)  We scheduled the Endoscopy with Dr  Arlyce Dice on 02-20-10. 4)  Directions and brochure provided. 5)  [facility] Patient Information Guide given to patient. 6)  Stop the Naproxen and Maloxicam. 7)  The medication list was reviewed and reconciled.  All changed / newly prescribed medications were explained.  A complete medication list was provided to the patient / caregiver. Prescriptions: PRILOSEC 20 MG CPDR (OMEPRAZOLE) take 1 cap twice daily, 30 min  before breakfast and dinner  #30 x 3   Entered by:   Lowry Ram NCMA   Authorized by:   Sammuel Cooper PA-c   Signed by:   Lowry Ram NCMA on 02/13/2010   Method used:   [facility]        [facility]  [address] [phone]* (retail)       [address]       [place], [place]  [address]       Ph: [phone] or [phone]       Fax: [phone]   RxID:   [id number]

## 2010-07-10 NOTE — Assessment & Plan Note (Signed)
Summary: default/cfb   Vital Signs:  Patient profile:   55 year old female Height:      63.5 inches Weight:      179.2 pounds BMI:     31.36 Temp:     97.7 degrees F oral Pulse rate:   85 / minute BP sitting:   140 / 70  (right arm)  Vitals Entered By: Filomena Jungling NT II (Oct 23, 2009 1:46 PM) CC: check up Is Patient Diabetic? No Pain Assessment Patient in pain? no      Nutritional Status BMI of > 30 = obese  Have you ever been in a relationship where you felt threatened, hurt or afraid?No   Does patient need assistance? Functional Status Self care Ambulation Normal   Primary Care Provider:  Laren Everts MD  CC:  check up.  History of Present Illness: 55 yr old woman with pmhx as described below comes to the clinic for follow up. Reports that meloxicam prescription has been taken over by Dr. Channing Mutters. Patient going to some stress/anxiety because of her husband medical conditions. Denies any suicidal or homicidal ideation. No other complains.   Preventive Screening-Counseling & Management  Alcohol-Tobacco     Alcohol type: Holidays - wine     Smoking Status: quit     Year Quit: 2004  Oct. 26     Passive Smoke Exposure: yes  Caffeine-Diet-Exercise     Does Patient Exercise: yes     Type of exercise: WEIGHT / PILATES     Times/week: 5  Problems Prior to Update: 1)  Fibromyalgia  (ICD-729.1) 2)  Preventive Health Care  (ICD-V70.0) 3)  Aftercare, Long-term Use, Medications Nec  (ICD-V58.69) 4)  Hysterectomy, Total, Hx of  (ICD-V45.77) 5)  Hyperlipidemia  (ICD-272.4) 6)  Hypertension, Essential Nos  (ICD-401.9) 7)  Degenerative Joint Disease  (ICD-715.90)  Medications Prior to Update: 1)  Neurontin 300 Mg Caps (Gabapentin) .... Take 2  Tablets By Mouth Three Times A Day 2)  Hydrochlorothiazide 25 Mg Tabs (Hydrochlorothiazide) .... Take 1 Tablet By Mouth Once Daily 3)  Flexeril 5 Mg Tabs (Cyclobenzaprine Hcl) .... Take 1 Tablet By Mouth At Bedtime As  Needed 4)  Multivitamins  Tabs (Multiple Vitamin) .... Take 1 By Mouth Once Daily 5)  Fish Oil 300 Mg Caps (Omega-3 Fatty Acids) .... Take 1 Tablet By Mouth Once Daily 6)  Calcium-Vitamin D 250-125 Mg-Unit Tabs (Calcium Carbonate-Vitamin D) .... Take 1 Tablet By Mouth Three Times A Day 7)  Lovastatin 20 Mg Tabs (Lovastatin) .... Take 1 Tablet By Mouth Once Daily 8)  Meloxicam 15 Mg  Tabs (Meloxicam) .... Take 1 Tablet By Mouth Once A Day With Food. 9)  Ultram 50 Mg Tabs (Tramadol Hcl) .... Take 1 Tablet By Mouth Every 6 Hours As Needed For Pain  Current Medications (verified): 1)  Neurontin 300 Mg Caps (Gabapentin) .... Take 2  Tablets By Mouth Three Times A Day 2)  Hydrochlorothiazide 25 Mg Tabs (Hydrochlorothiazide) .... Take 1 Tablet By Mouth Once Daily 3)  Flexeril 5 Mg Tabs (Cyclobenzaprine Hcl) .... Take 1 Tablet By Mouth At Bedtime As Needed 4)  Multivitamins  Tabs (Multiple Vitamin) .... Take 1 By Mouth Once Daily 5)  Fish Oil 300 Mg Caps (Omega-3 Fatty Acids) .... Take 1 Tablet By Mouth Once Daily 6)  Calcium-Vitamin D 250-125 Mg-Unit Tabs (Calcium Carbonate-Vitamin D) .... Take 1 Tablet By Mouth Three Times A Day 7)  Lovastatin 20 Mg Tabs (Lovastatin) .... Take 1 Tablet By Mouth  Once Daily 8)  Meloxicam 15 Mg  Tabs (Meloxicam) .... Take 1 Tablet By Mouth Once A Day With Food. 9)  Ultram 50 Mg Tabs (Tramadol Hcl) .... Take 1 Tablet By Mouth Every 6 Hours As Needed For Pain  Allergies: 1)  ! Codeine 2)  ! Pcn 3)  ! Morphine 4)  ! * Latex  Past History:  Past Medical History: Last updated: 02/23/2008 Degenerative Joint Disease Hypertension Hyperlipidemia  Past Surgical History: Last updated: 02/23/2008 Cervical fusion Lumbar fusion  Risk Factors: Exercise: yes (10/23/2009)  Risk Factors: Smoking Status: quit (10/23/2009) Passive Smoke Exposure: yes (10/23/2009)  Review of Systems  The patient denies fever, chest pain, dyspnea on exertion, peripheral edema,  prolonged cough, headaches, hemoptysis, abdominal pain, melena, hematochezia, hematuria, incontinence, and difficulty walking.    Physical Exam  General:  NAD Mouth:  MMM Lungs:  Normal respiratory effort, chest expands symmetrically. Lungs are clear to auscultation, no crackles or wheezes. Heart:  Normal rate and regular rhythm. S1 and S2 normal without gallop, murmur, click, rub or other extra sounds. Abdomen:  soft, non-tender, and normal bowel sounds.   Extremities:  no edema Neurologic:  alert & oriented X3.     Impression & Recommendations:  Problem # 1:  ANXIETY DEPRESSION (ICD-300.4) No SSI/HSI. Start patient on Amitriptyline. Reasses on follow up.  Problem # 2:  FIBROMYALGIA (ICD-729.1) Continue current regimen.  Her updated medication list for this problem includes:    Flexeril 5 Mg Tabs (Cyclobenzaprine hcl) .Marland Kitchen... Take 1 tablet by mouth at bedtime as needed    Meloxicam 15 Mg Tabs (Meloxicam) .Marland Kitchen... Take 1 tablet by mouth once a day with food.    Ultram 50 Mg Tabs (Tramadol hcl) .Marland Kitchen... Take 1 tablet by mouth every 6 hours as needed for pain  Problem # 3:  HYPERTENSION, ESSENTIAL NOS (ICD-401.9) Stable. Continue current regimen.  Her updated medication list for this problem includes:    Hydrochlorothiazide 25 Mg Tabs (Hydrochlorothiazide) .Marland Kitchen... Take 1 tablet by mouth once daily  Orders: T-Comprehensive Metabolic Panel ([id number])  BP today: 140/70 Prior BP: 150/83 (05/16/2009)  Labs Reviewed: K+: 4.6 (12/21/2008) Creat: : 0.76 (12/21/2008)   Chol: 159 (12/21/2008)   HDL: 53 (12/21/2008)   LDL: 88 (12/21/2008)   TG: 91 (12/21/2008)  Problem # 4:  HYPERLIPIDEMIA (ICD-272.4) At goal. Recheck FLP on follow up.  Her updated medication list for this problem includes:    Lovastatin 20 Mg Tabs (Lovastatin) .Marland Kitchen... Take 1 tablet by mouth once daily  Labs Reviewed: SGOT: 36 (12/21/2008)   SGPT: 22 (12/21/2008)   HDL:53 (12/21/2008), 59 (02/23/2008)  LDL:88  (12/21/2008), 93 (56/21/3086)  Chol:159 (12/21/2008), 186 (02/23/2008)  Trig:91 (12/21/2008), 168 (02/23/2008)  Problem # 5:  PREVENTIVE HEALTH CARE (ICD-V70.0) TSH wnl.  Complete Medication List: 1)  Neurontin 300 Mg Caps (Gabapentin) .... Take 2  tablets by mouth three times a day 2)  Hydrochlorothiazide 25 Mg Tabs (Hydrochlorothiazide) .... Take 1 tablet by mouth once daily 3)  Flexeril 5 Mg Tabs (Cyclobenzaprine hcl) .... Take 1 tablet by mouth at bedtime as needed 4)  Multivitamins Tabs (Multiple vitamin) .... Take 1 by mouth once daily 5)  Fish Oil 300 Mg Caps (Omega-3 fatty acids) .... Take 1 tablet by mouth once daily 6)  Calcium-vitamin D 250-125 Mg-unit Tabs (Calcium carbonate-vitamin d) .... Take 1 tablet by mouth three times a day 7)  Lovastatin 20 Mg Tabs (Lovastatin) .... Take 1 tablet by mouth once daily 8)  Meloxicam 15 Mg Tabs (  Meloxicam) .... Take 1 tablet by mouth once a day with food. 9)  Ultram 50 Mg Tabs (Tramadol hcl) .... Take 1 tablet by mouth every 6 hours as needed for pain 10)  Amitriptyline Hcl 50 Mg Tabs (Amitriptyline hcl) .... Take 1 tablet by mouth once at bedtime  Other Orders: T-TSH ([id number])  Patient Instructions: 1)  Please schedule a follow-up appointment in 3 months. 2)  Take all medication as directed. 3)  Start taking amitriptyline as directed. 4)  You will be called with any abnormalities in the tests scheduled or performed today.  If you don't hear from [place] within a week from when the test was performed, you can assume that your test was normal.  Prescriptions: AMITRIPTYLINE HCL 50 MG TABS (AMITRIPTYLINE HCL) Take 1 tablet by mouth once at bedtime  #30 x 3   Entered by:   Marin Roberts RN   Authorized by:   Laren Everts MD   Signed by:   Laren Everts MD on 10/26/2009   Method used:   [facility]        [facility]  [address] [phone]* (retail)       [address]       [place],  [place]  [id number]       Ph: [phone] or [phone]       Fax: [phone]   RxID:   [id number]   Prevention & Chronic Care Immunizations   Influenza vaccine: Fluvax MCR  (05/16/2009)    Tetanus booster: Not documented    Pneumococcal vaccine: Not documented  Colorectal Screening   Hemoccult: Not documented    Colonoscopy: Results: Normal.   (10/03/2007)   Colonoscopy action/deferral: Repeat colonoscopy in 10 years.    (10/03/2007)  Other Screening   Pap smear: Not documented   Pap smear action/deferral: Not indicated S/P hysterectomy  (12/21/2008)    Mammogram: ASSESSMENT: Negative - BI-RADS 1^MM DIGITAL SCREENING  (08/05/2009)   Mammogram action/deferral: Screening mammogram in 1 year.     (08/02/2008)   Smoking status: quit  (10/23/2009)  Lipids   Total Cholesterol: 159  (12/21/2008)   Lipid panel action/deferral: Lipid Panel ordered   LDL: 88  (12/21/2008)   LDL Direct: Not documented   HDL: 53  (12/21/2008)   Triglycerides: 91  (12/21/2008)    SGOT (AST): 36  (12/21/2008)   BMP action: Ordered   SGPT (ALT): 22  (12/21/2008) CMP ordered    Alkaline phosphatase: 55  (12/21/2008)   Total bilirubin: 0.8  (12/21/2008)    Lipid flowsheet reviewed?: Yes   Progress toward LDL goal: At goal  Hypertension   Last Blood Pressure: 140 / 70  (10/23/2009)   Serum creatinine: 0.76  (12/21/2008)   BMP action: Ordered   Serum potassium 4.6  (12/21/2008) CMP ordered     Hypertension flowsheet reviewed?: Yes   Progress toward BP goal: Unchanged  Self-Management Support :   Personal Goals (by the next clinic visit) :      Personal blood pressure goal: 140/90  (12/21/2008)     Personal LDL goal: 130  (12/21/2008)    Patient will work on the following items until the next clinic visit to reach self-care goals:     Medications and monitoring: take my medicines every day  (10/23/2009)     Eating: drink diet soda or water instead of juice or soda, eat more  vegetables, use fresh or frozen vegetables, eat foods that are low  in salt, eat baked foods instead of fried foods, eat fruit for snacks and desserts  (10/23/2009)     Activity: take a 30 minute walk every day  (10/23/2009)    Hypertension self-management support: Written self-care plan  (10/23/2009)   Hypertension self-care plan printed.    Lipid self-management support: Written self-care plan  (10/23/2009)   Lipid self-care plan printed.   Process Orders Check Orders Results:     [facility]: Check successful Tests Sent for requisitioning (Oct 26, 2009 11:18 AM):     10/23/2009: [facility] -- T-Comprehensive Metabolic Panel [[id number]] (signed)     10/23/2009: [facility] -- T-TSH [id number] (signed)

## 2010-07-10 NOTE — Assessment & Plan Note (Signed)
Summary: FLU SHOT/CH  Nurse Visit   Allergies: 1)  ! Codeine 2)  ! Pcn 3)  ! Morphine 4)  ! * Latex 5)  ! * Tramadol  Immunizations Administered:  Influenza Vaccine # 1:    Vaccine Type: Fluvax MCR    Site: left deltoid    Mfr: Gl[employer]oSmith[employer]    Dose: 0.5 ml    Route: IM    Given by: Stanton Kidney Ditzler RN    Exp. Date: 12/06/2010    Lot #: [id number]    VIS given: 12/31/09 version given February 19, 2010.  Flu Vaccine Consent Questions:    Do you have a history of severe allergic reactions to this vaccine? no    Any prior history of allergic reactions to egg and/or gelatin? no    Do you have a sensitivity to the preservative Thimersol? no    Do you have a past history of Guillan-Barre Syndrome? no    Do you currently have an acute febrile illness? no    Have you ever had a severe reaction to latex? no    Vaccine information given and explained to patient? yes    Are you currently pregnant? no  Orders Added: 1)  Influenza Vaccine MCR [[id number]]

## 2010-07-10 NOTE — Assessment & Plan Note (Signed)
Summary: f/u from endo--ch.   History of Present Illness Visit Type: Follow-up Visit Primary GI MD: Melvia Heaps MD [facility] Primary Provider: Laren Everts MD Chief Complaint: follow-up from EGD History of Present Illness:   Misty Decker has returned following upper endoscopy for upper abdominal pain.  NSAID-related erosions were seen in the gastric antrum.  Since stopping Naprosyn and Mobic her abdominal complaints have subsided.  She remains on omeprazole twice a day.   GI Review of Systems      Denies abdominal pain, acid reflux, belching, bloating, chest pain, dysphagia with liquids, dysphagia with solids, heartburn, loss of appetite, nausea, vomiting, vomiting blood, weight loss, and  weight gain.        Denies anal fissure, black tarry stools, change in bowel habit, constipation, diarrhea, diverticulosis, fecal incontinence, heme positive stool, hemorrhoids, irritable bowel syndrome, jaundice, light color stool, liver problems, rectal bleeding, and  rectal pain.    Current Medications (verified): 1)  Neurontin 300 Mg Caps (Gabapentin) .... Take 2  Tablets By Mouth Three Times A Day 2)  Hydrochlorothiazide 25 Mg Tabs (Hydrochlorothiazide) .... Take 1 Tablet By Mouth Once Daily 3)  Flexeril 5 Mg Tabs (Cyclobenzaprine Hcl) .... Take 1 Tablet By Mouth At Bedtime As Needed 4)  Multivitamins  Tabs (Multiple Vitamin) .... Take 1 By Mouth Once Daily 5)  Fish Oil 300 Mg Caps (Omega-3 Fatty Acids) .... Take 1 Tablet By Mouth Once Daily 6)  Calcium-Vitamin D 250-125 Mg-Unit Tabs (Calcium Carbonate-Vitamin D) .... Take 1 Tablet By Mouth Three Times A Day 7)  Lovastatin 20 Mg Tabs (Lovastatin) .... Take 1 Tablet By Mouth Once Daily 8)  Amitriptyline Hcl 50 Mg Tabs (Amitriptyline Hcl) .... Take 1 Tablet By Mouth Once At Bedtime 9)  Co Q-10 150 Mg Caps (Coenzyme Q10) .Marland Kitchen.. 1 By Mouth Once Daily 10)  Vitamin E .... 1 By Mouth Once Daily 11)  Prilosec 20 Mg Cpdr (Omeprazole) .... Take 1 Cap  Twice Daily, 30 Min  Before Breakfast and Dinner  Allergies (verified): 1)  ! Codeine 2)  ! Pcn 3)  ! Morphine 4)  ! * Latex 5)  ! * Tramadol  Past History:  Past Medical History: Reviewed history from 02/13/2010 and no changes required. Degenerative Joint Disease-S/P LUMBAR AND CERVICAL FUSIONS Hypertension Hyperlipidemia  Past Surgical History: Reviewed history from 02/13/2010 and no changes required. Cervical fusion x3 Lumbar fusion x 2 Hysterectomy Tonsillectomy Rectal Repair  Family History: Reviewed history from 02/13/2010 and no changes required. No FH of Colon Cancer: Family History of Breast Cancer:mother Family History of Diabetes:   Social History: Reviewed history from 02/13/2010 and no changes required. Married 2 girls Disabled Patient is a former smoker.  quit 2004 Alcohol Use - no Illicit Drug Use - no  Review of Systems       The patient complains of arthritis/joint pain.  The patient denies allergy/sinus, anemia, anxiety-new, back pain, blood in urine, breast changes/lumps, change in vision, confusion, cough, coughing up blood, depression-new, fainting, fatigue, fever, headaches-new, hearing problems, heart murmur, heart rhythm changes, itching, menstrual pain, muscle pains/cramps, night sweats, nosebleeds, pregnancy symptoms, shortness of breath, skin rash, sleeping problems, sore throat, swelling of feet/legs, swollen lymph glands, thirst - excessive , urination - excessive , urination changes/pain, urine leakage, vision changes, and voice change.    Vital Signs:  Patient profile:   55 year old female Height:      63.5 inches Weight:      186 pounds BMI:  32.55 Pulse rate:   88 / minute Pulse rhythm:   regular BP sitting:   102 / 62  (left arm)  Vitals Entered By: Milford Cage NCMA (March 31, 2010 2:38 PM)   Impression & Recommendations:  Problem # 1:  ABDOMINAL PAIN, EPIGASTRIC (ICD-789.06) Dyspepsia was due to her NSAIDs.  Findings  on endoscopy also coroborate with the CT findings.  Recommendations #1 decrease omeprazole to 20 mg a day #2 patient will try Celebrex 100 mg daily for her chronic back pain.  She was carefully instructed to discontinue this if she again developed abdominal pain.  Patient Instructions: 1)  Copy sent to : Laren Everts MD 2)  Your Med will be called into your pharmacy 3)  The medication list was reviewed and reconciled.  All changed / newly prescribed medications were explained.  A complete medication list was provided to the patient / caregiver. Prescriptions: CELEBREX 100 MG CAPS (CELECOXIB) take one tab daily with food  #7 x 5   Entered and Authorized by:   Louis Meckel MD   Signed by:   Louis Meckel MD on 03/31/2010   Method used:   [facility]  [address] [phone]* (retail)       [address]       [place], [place]  [id number]       Ph: [phone] or [phone]       Fax: [phone]   RxID:   [id number]

## 2010-07-10 NOTE — Progress Notes (Signed)
Summary: refill/gg  Phone Note Refill Request  on June 24, 2010 10:42 AM  Refills Requested: Medication #1:  FLEXERIL 5 MG TABS take 1 tablet by mouth at bedtime as needed   Last Refilled: 05/24/2010 Pt has scheduled appointment in Feb.   Method Requested: Electronic Initial call taken by: Merrie Roof RN,  June 24, 2010 10:42 AM    Prescriptions: FLEXERIL 5 MG TABS (CYCLOBENZAPRINE HCL) take 1 tablet by mouth at bedtime as needed  #30 Tablet x 3   Entered and Authorized by:   Ulyess Mort MD   Signed by:   Ulyess Mort MD on 06/24/2010   Method used:   [facility]        [facility]  [address] [phone]* (retail)       [address]       [place], [place]  [id number]       Ph: [phone] or [phone]       Fax: [phone]   RxID:   [id number]

## 2010-07-10 NOTE — Letter (Signed)
Summary: New Patient letter  [facility]  [address] [place], [place] [address]   Phone: [phone]  Fax: [phone]       02/24/2010 MRN: [id number]  Misty Decker [address] [place], [place]  [address]  Dear [name],  Welcome to the Gastroenterology Division at [facility].    You are scheduled to see Dr.  Arlyce Dice on 03-31-10 at 3:00p.m. on the 3rd floor at [facility], [address]  We ask that you try to arrive at our office 15 minutes prior to your appointment time to allow for check-in.  We would like you to complete the enclosed self-administered evaluation form prior to your visit and bring it with you on the day of your appointment.  We will review it with you.  Also, please bring a complete list of all your medications or, if you prefer, bring the medication bottles and we will list them.  Please bring your insurance card so that we may make a copy of it.  If your insurance requires a referral to see a specialist, please bring your referral form from your primary care physician.  Co-payments are due at the time of your visit and may be paid by cash, check or credit card.     Your office visit will consist of a consult with your physician (includes a physical exam), any laboratory testing he/she may order, scheduling of any necessary diagnostic testing (e.g. x-ray, ultrasound, CT-scan), and scheduling of a procedure (e.g. Endoscopy, Colonoscopy) if required.  Please allow enough time on your schedule to allow for any/all of these possibilities.    If you cannot keep your appointment, please call [phone] to cancel or reschedule prior to your appointment date.  This allows [place] the opportunity to schedule an appointment for another patient in need of care.  If you do not cancel or reschedule by 5 p.m. the business day prior to your appointment date, you will be charged a $50.00 late cancellation/no-show fee.    Thank you for choosing   Gastroenterology for your medical needs.  We appreciate the opportunity to care for you.  Please visit [place] at our website  to learn more about our practice.                     Sincerely,                                                             The Gastroenterology Division

## 2010-07-10 NOTE — Assessment & Plan Note (Signed)
Summary: est-ck/fu/meds/cfb   Vital Signs:  Patient Profile:   55 Years Old Female Height:     63.5 inches (161.29 cm) Weight:      174.0 pounds (79.09 kg) BMI:     30.45 Temp:     98.0 degrees F (36.67 degrees C) oral Pulse rate:   91 / minute BP sitting:   123 / 87  (right arm)  Pt. in pain?   no  Vitals Entered By: Filomena Jungling (August 15, 2007 3:48 PM)              Is Patient Diabetic? No  Have you ever been in a relationship where you felt threatened, hurt or afraid?No   Does patient need assistance? Functional Status Self care Ambulation Normal     PCP:  Yetta Barre, MD  Chief Complaint:  check-up.  History of Present Illness: Misty Decker is a 55 y/o white woman with a h/o DJD, HTN and HL presenting to the clinic today for a routine follow up. She had been started on Lisinopril last visit and she reports no adverse events. She still sees Dr. Thurston Hole and Dr. Channing Mutters regularly for her Rt shoulder pain and Left knee pain. She uses a brace for her knee now and it has helped with mobilization and also decrease pain. Pt has regular mammograms (last 2/09) which have consistently been negative. She has no complaints for today.     Prior Medication List:  NEURONTIN 300 MG CAPS (GABAPENTIN) take 1 tablet by mouth every morning, one tablet at lunch and 2 tablets at bedtime HYDROCHLOROTHIAZIDE 25 MG TABS (HYDROCHLOROTHIAZIDE) take 1 tablet by mouth once daily FLEXERIL 5 MG TABS (CYCLOBENZAPRINE HCL) take 1 tablet by mouth at bedtime as needed MULTIVITAMINS  TABS (MULTIPLE VITAMIN) take 1 by mouth once daily ASPIRIN 81 MG TBEC (ASPIRIN) take 1 tablet by mouth once daily FISH OIL 300 MG CAPS (OMEGA-3 FATTY ACIDS) take 1 tablet by mouth once daily CALCIUM-VITAMIN D 250-125 MG-UNIT TABS (CALCIUM CARBONATE-VITAMIN D) take 1 tablet by mouth three times a day LOVASTATIN 20 MG TABS (LOVASTATIN) take 1 tablet by mouth once daily MELOXICAM 15 MG  TABS (MELOXICAM) Take 1 tablet by mouth  once a day with food. LISINOPRIL 2.5 MG  TABS (LISINOPRIL) Take 1 tablet by mouth once a day   Current Allergies: ! CODEINE ! PCN ! MORPHINE    Risk Factors: Tobacco use:  quit    Year quit:  4 years Alcohol use:  yes Exercise:  no Seatbelt use:  100 %   Review of Systems  The patient denies fever, chest pain, syncope, peripheral edema, hemoptysis, and abdominal pain.         Pt has recently recovered from the flu.   Physical Exam  General:     alert, well-developed, and well-nourished.   Head:     atraumatic.   Eyes:     pupils equal, pupils round, and pupils reactive to light.   Mouth:     pharynx pink and moist.   Neck:     supple.   Lungs:     normal respiratory effort, normal breath sounds, no crackles, and no wheezes.   Heart:     normal rate, regular rhythm, no murmur, no gallop, and no rub.   Abdomen:     soft, non-tender, normal bowel sounds, and no distention.   Msk:     Left knee is in a brace with minimal restriction in flexion. Rt knee normal.  Extremities:     No clubbing, cyanosis, edema, or deformity noted with normal full range of motion of all joints.   Skin:     No rashes Cervical Nodes:     No lymphadenopathy noted    Impression & Recommendations:  Problem # 1:  DEGENERATIVE JOINT DISEASE (ICD-715.90) We will check a DEXA scan per pt's request. She has known DJD and is on calcium already. However, the DEXA scan results may not change current management unless osteoporosis is noted.  Her updated medication list for this problem includes:    Aspirin 81 Mg Tbec (Aspirin) .Marland Kitchen... Take 1 tablet by mouth once daily    Meloxicam 15 Mg Tabs (Meloxicam) .Marland Kitchen... Take 1 tablet by mouth once a day with food.  Orders: Dexa scan (Dexa scan)   Problem # 2:  HYPERTENSION, ESSENTIAL NOS (ICD-401.9) Better control after starting on Lisinopril with BMET wnl. Check CMET today. F/u in 3 months.   Her updated medication list for this problem includes:     Hydrochlorothiazide 25 Mg Tabs (Hydrochlorothiazide) .Marland Kitchen... Take 1 tablet by mouth once daily    Lisinopril 2.5 Mg Tabs (Lisinopril) .Marland Kitchen... Take 1 tablet by mouth once a day  Orders: T-Comprehensive Metabolic Panel ([id number])  BP today: 123/87 Prior BP: 144/94 (05/09/2007)  Labs Reviewed: Creat: 0.81 (05/13/2007) Chol: 141 (02/25/2007)   HDL: 57 (02/25/2007)   LDL: 64 (02/25/2007)   TG: 100 (02/25/2007)   Problem # 3:  HYPERLIPIDEMIA (ICD-272.4) Well controlled. Will check LFTs today. F/u FLP 9/09.  Her updated medication list for this problem includes:    Lovastatin 20 Mg Tabs (Lovastatin) .Marland Kitchen... Take 1 tablet by mouth once daily  Labs Reviewed: Chol: 141 (02/25/2007)   HDL: 57 (02/25/2007)   LDL: 64 (02/25/2007)   TG: 100 (02/25/2007) SGOT: 21 (05/09/2007)   SGPT: 17 (05/09/2007)   Problem # 4:  PREVENTIVE HEALTH CARE (ICD-V70.0) Pt had a normal mammogram in 2/09. Pt does not need a Pap smear since she has had a hysterectomy. Pt said only her Lt ovary remains.  Pt will be scheduled for a screening colonoscopy. She currently does not report melena, blood in stools etc, recent change in bowel habits.  Orders: Gastroenterology Referral (GI)  .    Complete Medication List: 1)  Neurontin 300 Mg Caps (Gabapentin) .... Take 1 tablet by mouth every morning, one tablet at lunch and 2 tablets at bedtime 2)  Hydrochlorothiazide 25 Mg Tabs (Hydrochlorothiazide) .... Take 1 tablet by mouth once daily 3)  Flexeril 5 Mg Tabs (Cyclobenzaprine hcl) .... Take 1 tablet by mouth at bedtime as needed 4)  Multivitamins Tabs (Multiple vitamin) .... Take 1 by mouth once daily 5)  Aspirin 81 Mg Tbec (Aspirin) .... Take 1 tablet by mouth once daily 6)  Fish Oil 300 Mg Caps (Omega-3 fatty acids) .... Take 1 tablet by mouth once daily 7)  Calcium-vitamin D 250-125 Mg-unit Tabs (Calcium carbonate-vitamin d) .... Take 1 tablet by mouth three times a day 8)  Lovastatin 20 Mg Tabs (Lovastatin) .... Take  1 tablet by mouth once daily 9)  Meloxicam 15 Mg Tabs (Meloxicam) .... Take 1 tablet by mouth once a day with food. 10)  Lisinopril 2.5 Mg Tabs (Lisinopril) .... Take 1 tablet by mouth once a day   Patient Instructions: 1)  Please schedule a follow-up appointment in 3 months. 2)  Please keep your appointment for the colonoscopy and the bone scan(DEXA scan)    ]

## 2010-07-10 NOTE — Progress Notes (Signed)
Summary: refill/gg  Phone Note Refill Request  on July 30, 2009 2:40 PM  Refills Requested: Medication #1:  ULTRAM 50 MG TABS Take 1 tablet by mouth every 6 hours as needed for pain.   Last Refilled: 05/16/2009  Method Requested: Electronic Initial call taken by: Merrie Roof RN,  July 30, 2009 2:40 PM    Prescriptions: ULTRAM 50 MG TABS (TRAMADOL HCL) Take 1 tablet by mouth every 6 hours as needed for pain  #30 x 0   Entered and Authorized by:   Laren Everts MD   Signed by:   Laren Everts MD on 07/30/2009   Method used:   [facility] to        [facility]  [address] [phone][facility] (retail)       [address]       [place], [place]  [address]       Ph: [phone] or [phone]       Fax: [phone]   RxID:   [id number]

## 2010-07-10 NOTE — Progress Notes (Signed)
Summary: refill/GG  Phone Note Refill Request  on November 07, 2009 11:38 AM  Refills Requested: Medication #1:  FLEXERIL 5 MG TABS take 1 tablet by mouth at bedtime as needed   Last Refilled: 10/09/2009  Method Requested: Electronic Initial call taken by: Merrie Roof RN,  November 07, 2009 11:38 AM    Prescriptions: FLEXERIL 5 MG TABS (CYCLOBENZAPRINE HCL) take 1 tablet by mouth at bedtime as needed  #30 Tablet x 3   Entered and Authorized by:   Laren Everts MD   Signed by:   Laren Everts MD on 11/07/2009   Method used:   [facility]        [facility]  [address] [phone][facility] (retail)       [address]       [place], [place]  [id number]       Ph: [phone] or [phone]       Fax: [phone]   RxID:   [id number]

## 2010-07-10 NOTE — Progress Notes (Signed)
  Phone Note Call from Patient   Summary of Call: pt calls states pharm has not rec'd script for amitryptiline, [facility]  Initial call taken by: Marin Roberts RN,  Oct 24, 2009 2:51 PM

## 2010-07-10 NOTE — Progress Notes (Signed)
Summary: Celebrex refill       Additional Follow-up for Phone Call Additional follow up Details #2::    Dr. Arlyce Dice please refer to office note dated 03/31/10. Patient was given prescription for Celebrex 100mg  #7 with 5 refills. She has used all of the refills and is requesting another prescription. Do you want to refill this, Please advise. Selinda Michaels, RN  Additional Follow-up for Phone Call Additional follow up Details #3:: Details for Additional Follow-up Action Taken: ok  Additional Follow-up by: Louis Meckel MD,  May 28, 2010 9:49 AM  Prescriptions: CELEBREX 100 MG CAPS (CELECOXIB) take one tab daily with food  #7 x 5   Entered by:   Selinda Michaels RN   Authorized by:   Louis Meckel MD   Signed by:   Selinda Michaels RN on 05/28/2010   Method used:   [facility]        [facility]  [address] [phone][facility] (retail)       [address]       [place], [place]  [address]       Ph: [phone] or [phone]       Fax: [phone]   RxID:   [id number]

## 2010-07-10 NOTE — Progress Notes (Signed)
Summary: meloxicam/ hla  Phone Note Call from Patient   Summary of Call: pt calls asking why she didn't get a refill on meloxicam, informed it was too early, that she is to get 15/month. she states that the directions say 1 daily, i told her that  the dr was only prescribing 15/m and had been doing so for quite awhile, she states she can get dr Channing Mutters to prescribe it. i informed her that is up to her who she wants to have prescribe it but both dr's cannot prescribe it and if dr Channing Mutters takes over filling it either his office or she must inform the internal med ctr. she is agreeable. Initial call taken by: Marin Roberts RN,  September 17, 2009 11:43 AM    Noted. Thank you.

## 2010-07-10 NOTE — Progress Notes (Signed)
Summary: Refill/gh  Phone Note Refill Request Message from:  Fax from Pharmacy on July 12, 2009 9:10 AM  Refills Requested: Medication #1:  FLEXERIL 5 MG TABS take 1 tablet by mouth at bedtime as needed   Last Refilled: 06/12/2009  Method Requested: Electronic Initial call taken by: Angelina Ok RN,  July 12, 2009 9:10 AM    Prescriptions: FLEXERIL 5 MG TABS (CYCLOBENZAPRINE HCL) take 1 tablet by mouth at bedtime as needed  #30 Tablet x 3   Entered and Authorized by:   Laren Everts MD   Signed by:   Laren Everts MD on 07/12/2009   Method used:   [facility]        [facility]  [address] [phone]* (retail)       [address]       [place], [place]  [id number]       Ph: [phone] or [phone]       Fax: [phone]   RxID:   [id number]

## 2010-07-10 NOTE — Assessment & Plan Note (Signed)
Summary: FU VISIT/DS   Vital Signs:  Patient profile:   55 year old female Height:      63.5 inches Weight:      181.03 pounds BMI:     31.68 Temp:     98.3 degrees F oral Pulse rate:   94 / minute BP sitting:   141 / 90  (left arm)  Vitals Entered By: Angelina Ok RN (January 23, 2010 1:31 PM) CC: Depression Is Patient Diabetic? No Pain Assessment Patient in pain? yes     Location: neck, shoulder, head Intensity: 9 Type: pressure,aching Onset of pain  Constant Nutritional Status BMI of > 30 = obese  Have you ever been in a relationship where you felt threatened, hurt or afraid?No   Does patient need assistance? Functional Status Self care Ambulation Normal Comments Pain starts in neck and radiates to her arm and hand.  Larey Seat several times this summer.   Primary Care Charlayne Vultaggio:  Laren Everts MD  CC:  Depression.  History of Present Illness: 55 yr old woman with pmhx as described below comes to the clinic for follow up. Patient reports to have sciatica on both lower extremities, which is chronic. Denies any new weakness, bladder or bowel incontinence, or new numbness or tingling. Patient has an appointment with Dr. Channing Mutters on monday. In the past patient has gotten steroids shot but relief has not lasted for more than one week.   Patient says she is responding well to amytryptiline.   She is going through alot of stress.   Depression History:      The patient denies a depressed mood most of the day and a diminished interest in her usual daily activities.         Preventive Screening-Counseling & Management  Alcohol-Tobacco     Alcohol type: Holidays - wine     Smoking Status: quit     Year Quit: 2004  Oct. 26     Passive Smoke Exposure: yes  Problems Prior to Update: 1)  Anxiety Depression  (ICD-300.4) 2)  Fibromyalgia  (ICD-729.1) 3)  Preventive Health Care  (ICD-V70.0) 4)  Aftercare, Long-term Use, Medications Nec  (ICD-V58.69) 5)  Hysterectomy, Total,  Hx of  (ICD-V45.77) 6)  Hyperlipidemia  (ICD-272.4) 7)  Hypertension, Essential Nos  (ICD-401.9) 8)  Degenerative Joint Disease  (ICD-715.90)  Medications Prior to Update: 1)  Neurontin 300 Mg Caps (Gabapentin) .... Take 2  Tablets By Mouth Three Times A Day 2)  Hydrochlorothiazide 25 Mg Tabs (Hydrochlorothiazide) .... Take 1 Tablet By Mouth Once Daily 3)  Flexeril 5 Mg Tabs (Cyclobenzaprine Hcl) .... Take 1 Tablet By Mouth At Bedtime As Needed 4)  Multivitamins  Tabs (Multiple Vitamin) .... Take 1 By Mouth Once Daily 5)  Fish Oil 300 Mg Caps (Omega-3 Fatty Acids) .... Take 1 Tablet By Mouth Once Daily 6)  Calcium-Vitamin D 250-125 Mg-Unit Tabs (Calcium Carbonate-Vitamin D) .... Take 1 Tablet By Mouth Three Times A Day 7)  Lovastatin 20 Mg Tabs (Lovastatin) .... Take 1 Tablet By Mouth Once Daily 8)  Meloxicam 15 Mg  Tabs (Meloxicam) .... Take 1 Tablet By Mouth Once A Day With Food. 9)  Ultram 50 Mg Tabs (Tramadol Hcl) .... Take 1 Tablet By Mouth Every 6 Hours As Needed For Pain 10)  Amitriptyline Hcl 50 Mg Tabs (Amitriptyline Hcl) .... Take 1 Tablet By Mouth Once At Bedtime  Current Medications (verified): 1)  Neurontin 300 Mg Caps (Gabapentin) .... Take 2  Tablets By Mouth Three Times  A Day 2)  Hydrochlorothiazide 25 Mg Tabs (Hydrochlorothiazide) .... Take 1 Tablet By Mouth Once Daily 3)  Flexeril 5 Mg Tabs (Cyclobenzaprine Hcl) .... Take 1 Tablet By Mouth At Bedtime As Needed 4)  Multivitamins  Tabs (Multiple Vitamin) .... Take 1 By Mouth Once Daily 5)  Fish Oil 300 Mg Caps (Omega-3 Fatty Acids) .... Take 1 Tablet By Mouth Once Daily 6)  Calcium-Vitamin D 250-125 Mg-Unit Tabs (Calcium Carbonate-Vitamin D) .... Take 1 Tablet By Mouth Three Times A Day 7)  Lovastatin 20 Mg Tabs (Lovastatin) .... Take 1 Tablet By Mouth Once Daily 8)  Meloxicam 15 Mg  Tabs (Meloxicam) .... Take 1 Tablet By Mouth Once A Day With Food. 9)  Amitriptyline Hcl 50 Mg Tabs (Amitriptyline Hcl) .... Take 1 Tablet By  Mouth Once At Bedtime  Allergies: 1)  ! Codeine 2)  ! Pcn 3)  ! Morphine 4)  ! * Latex  Past History:  Past Medical History: Last updated: 02/23/2008 Degenerative Joint Disease Hypertension Hyperlipidemia  Past Surgical History: Last updated: 02/23/2008 Cervical fusion Lumbar fusion  Risk Factors: Exercise: yes (10/23/2009)  Risk Factors: Smoking Status: quit (01/23/2010) Passive Smoke Exposure: yes (01/23/2010)  Review of Systems  The patient denies fever, chest pain, dyspnea on exertion, peripheral edema, hemoptysis, abdominal pain, melena, hematochezia, hematuria, and unusual weight change.    Physical Exam  General:  NAD Mouth:  MMM Neck:  supple and full ROM.   Lungs:  Normal respiratory effort, chest expands symmetrically. Lungs are clear to auscultation, no crackles or wheezes. Heart:  Normal rate and regular rhythm. S1 and S2 normal without gallop, murmur, click, rub or other extra sounds. Abdomen:  soft, non-tender, and normal bowel sounds.   Msk:  ttp multiple areas of her back Extremities:  no edema Neurologic:  alert & oriented X3.  cranial nerves II-XII intact, strength normal in all extremities, sensation intact to light touch, and gait normal.     Impression & Recommendations:  Problem # 1:  DEGENERATIVE JOINT DISEASE (ICD-715.90) Patient will be given some naproxen for pain. Instructed not to take meloxicam while taking naproxen. Reports that tramadol caused restless leg syndrome, so discontinued. Patient will see Dr. Channing Mutters on monday. Will defer further pain regimen per him.   The following medications were removed from the medication list:    Ultram 50 Mg Tabs (Tramadol hcl) .Marland Kitchen... Take 1 tablet by mouth every 6 hours as needed for pain Her updated medication list for this problem includes:    Meloxicam 15 Mg Tabs (Meloxicam) .Marland Kitchen... Take 1 tablet by mouth once a day with food.    Naproxen Sodium 550 Mg Tabs (Naproxen sodium) .Marland Kitchen... Take 1 tablet by  mouth two times a day as needed pain  Problem # 2:  SCIATICA (ICD-724.3) Continue neurontin. No new symptoms concerning for cauda equina syndrome. Will have patient follow up with Dr. Channing Mutters Spine specialist on monday and follow up.  The following medications were removed from the medication list:    Ultram 50 Mg Tabs (Tramadol hcl) .Marland Kitchen... Take 1 tablet by mouth every 6 hours as needed for pain Her updated medication list for this problem includes:    Flexeril 5 Mg Tabs (Cyclobenzaprine hcl) .Marland Kitchen... Take 1 tablet by mouth at bedtime as needed    Meloxicam 15 Mg Tabs (Meloxicam) .Marland Kitchen... Take 1 tablet by mouth once a day with food.    Naproxen Sodium 550 Mg Tabs (Naproxen sodium) .Marland Kitchen... Take 1 tablet by mouth two times a  day as needed pain  Problem # 3:  HYPERTENSION, ESSENTIAL NOS (ICD-401.9) Continue current regimen. Check labs and reassess.  Her updated medication list for this problem includes:    Hydrochlorothiazide 25 Mg Tabs (Hydrochlorothiazide) .Marland Kitchen... Take 1 tablet by mouth once daily  Orders: T-CMP with Estimated GFR ([id number]) T-CBC No Diff ([id number])  BP today: 141/90 Prior BP: 140/70 (10/23/2009)  Labs Reviewed: K+: 3.8 (10/23/2009) Creat: : 0.70 (10/23/2009)   Chol: 159 (12/21/2008)   HDL: 53 (12/21/2008)   LDL: 88 (12/21/2008)   TG: 91 (12/21/2008)  Problem # 4:  ANXIETY DEPRESSION (ICD-300.4) Continue amitrytyline. Check TSH and reassess.  Orders: T-TSH ([id number])  Complete Medication List: 1)  Neurontin 300 Mg Caps (Gabapentin) .... Take 2  tablets by mouth three times a day 2)  Hydrochlorothiazide 25 Mg Tabs (Hydrochlorothiazide) .... Take 1 tablet by mouth once daily 3)  Flexeril 5 Mg Tabs (Cyclobenzaprine hcl) .... Take 1 tablet by mouth at bedtime as needed 4)  Multivitamins Tabs (Multiple vitamin) .... Take 1 by mouth once daily 5)  Fish Oil 300 Mg Caps (Omega-3 fatty acids) .... Take 1 tablet by mouth once daily 6)  Calcium-vitamin D 250-125 Mg-unit Tabs  (Calcium carbonate-vitamin d) .... Take 1 tablet by mouth three times a day 7)  Lovastatin 20 Mg Tabs (Lovastatin) .... Take 1 tablet by mouth once daily 8)  Meloxicam 15 Mg Tabs (Meloxicam) .... Take 1 tablet by mouth once a day with food. 9)  Amitriptyline Hcl 50 Mg Tabs (Amitriptyline hcl) .... Take 1 tablet by mouth once at bedtime 10)  Naproxen Sodium 550 Mg Tabs (Naproxen sodium) .... Take 1 tablet by mouth two times a day as needed pain  Patient Instructions: 1)  Please schedule a follow-up appointment in 3 months. 2)  Remember not to take Meloxicam if you take Naproxen. 3)  Take medication as directed. Prescriptions: NAPROXEN SODIUM 550 MG TABS (NAPROXEN SODIUM) Take 1 tablet by mouth two times a day as needed pain  #30 x 0   Entered and Authorized by:   Laren Everts MD   Signed by:   Laren Everts MD on 01/23/2010   Method used:   Print then Give to Patient   RxID:   [id number]    Vital Signs:  Patient profile:   55 year old female Height:      63.5 inches Weight:      181.03 pounds BMI:     31.68 Temp:     98.3 degrees F oral Pulse rate:   94 / minute BP sitting:   141 / 90  (left arm)  Vitals Entered By: Angelina Ok RN (January 23, 2010 1:31 PM)   Prevention & Chronic Care Immunizations   Influenza vaccine: Fluvax MCR  (05/16/2009)    Tetanus booster: Not documented    Pneumococcal vaccine: Not documented  Colorectal Screening   Hemoccult: Not documented    Colonoscopy: Results: Normal.   (10/03/2007)   Colonoscopy action/deferral: Repeat colonoscopy in 10 years.    (10/03/2007)  Other Screening   Pap smear: Not documented   Pap smear action/deferral: Not indicated S/P hysterectomy  (12/21/2008)    Mammogram: ASSESSMENT: Negative - BI-RADS 1^MM DIGITAL SCREENING  (08/05/2009)   Mammogram action/deferral: Screening mammogram in 1 year.     (08/02/2008)   Smoking status: quit  (01/23/2010)  Lipids   Total Cholesterol: 159   (12/21/2008)   Lipid panel action/deferral: Lipid Panel ordered   LDL: 88  (12/21/2008)   LDL Direct:  Not documented   HDL: 53  (12/21/2008)   Triglycerides: 91  (12/21/2008)    SGOT (AST): 35  (10/23/2009)   BMP action: Ordered   SGPT (ALT): 30  (10/23/2009)   Alkaline phosphatase: 78  (10/23/2009)   Total bilirubin: 0.3  (10/23/2009)    Lipid flowsheet reviewed?: Yes   Progress toward LDL goal: At goal  Hypertension   Last Blood Pressure: 141 / 90  (01/23/2010)   Serum creatinine: 0.70  (10/23/2009)   BMP action: Ordered   Serum potassium 3.8  (10/23/2009)    Hypertension flowsheet reviewed?: Yes   Progress toward BP goal: Unchanged  Self-Management Support :   Personal Goals (by the next clinic visit) :      Personal blood pressure goal: 140/90  (12/21/2008)     Personal LDL goal: 130  (12/21/2008)    Patient will work on the following items until the next clinic visit to reach self-care goals:     Medications and monitoring: take my medicines every day, bring all of my medications to every visit  (01/23/2010)     Eating: drink diet soda or water instead of juice or soda, eat more vegetables, use fresh or frozen vegetables, eat foods that are low in salt, eat baked foods instead of fried foods, eat fruit for snacks and desserts, limit or avoid alcohol  (01/23/2010)     Activity: take a 30 minute walk every day  (01/23/2010)    Hypertension self-management support: Written self-care plan, Education handout, Pre-printed educational material, Resources for patients handout  (01/23/2010)   Hypertension self-care plan printed.   Hypertension education handout printed    Lipid self-management support: Written self-care plan, Education handout, Pre-printed educational material, Resources for patients handout  (01/23/2010)   Lipid self-care plan printed.   Lipid education handout printed      Resource handout printed.    Process Orders Check Orders Results:     [facility]: Check successful Tests Sent for requisitioning (January 23, 2010 3:33 PM):     01/23/2010: [facility] -- T-CMP with Estimated GFR [[id number]] (signed)     01/23/2010: [facility] -- T-CBC No Diff [[id number]] (signed)     01/23/2010: [facility] -- T-TSH [phone] (signed)

## 2010-07-10 NOTE — Progress Notes (Signed)
Summary: Abd Pain- wants sooner appt  Phone Note Call from Patient Call back at Home Phone [phone]   Call For: Dr Arlyce Dice Summary of Call: Was in the Emergency roomfor severe abd pain and was told she needs to see Dr Arlyce Dice to screen for an Endo. Next available appt is 03-18-10 would like to be seen sooner. Initial call taken by: Leanor Kail [facility],  February 11, 2010 8:47 AM  Follow-up for Phone Call        Given appt. for Thursday with PA as Dr.Kaplan is supervising dr. Follow-up by: Teryl Lucy RN,  February 11, 2010 12:37 PM

## 2010-07-10 NOTE — Progress Notes (Signed)
Summary: med refill/gp  Phone Note Refill Request Message from:  Fax from Pharmacy on July 15, 2009 11:42 AM  Refills Requested: Medication #1:  MELOXICAM 15 MG  TABS Take 1 tablet by mouth once a day with food.   Last Refilled: 06/30/2009 Last appt. 05/16/09.   Method Requested: Electronic Initial call taken by: Chinita Pester RN,  July 15, 2009 11:43 AM    Prescriptions: MELOXICAM 15 MG  TABS (MELOXICAM) Take 1 tablet by mouth once a day with food.  #15 x 2   Entered and Authorized by:   Zoila Shutter MD   Signed by:   Zoila Shutter MD on 07/16/2009   Method used:   [facility]        [facility]  [address] [phone]* (retail)       [address]       [place], [place]  [id number]       Ph: [phone] or [phone]       Fax: [phone]   RxID:   [id number]

## 2010-07-10 NOTE — Letter (Signed)
Summary: EGD Instructions  [facility]  [address] [place], [place] [address]   Phone: [phone]  Fax: [phone]       Misty Decker    [birth year]    MRN: [id number]       Procedure Day /Date:02-20-10     Arrival Time:  2:00 PM      Procedure Time: 3:00 PM     Location of Procedure:                    X     [facility] (4th Floor)   PREPARATION FOR ENDOSCOPY   On 02-20-10 THE DAY OF THE PROCEDURE:  1.   No solid foods, milk or milk products are allowed after midnight the night before your procedure.  2.   Do not drink anything colored red or purple.  Avoid juices with pulp.  No orange juice.  3.  You may drink clear liquids until 1:00 PM , which is 2 hours before your procedure.                                                                                                CLEAR LIQUIDS INCLUDE: Water Jello Ice Popsicles Tea (sugar ok, no milk/cream) Powdered fruit flavored drinks Coffee (sugar ok, no milk/cream) Gatorade Juice: apple, white grape, white cranberry  Lemonade Clear bullion, consomm, broth Carbonated beverages (any kind) Strained chicken noodle soup Hard Candy   MEDICATION INSTRUCTIONS  Unless otherwise instructed, you should take regular prescription medications with a small sip of water as early as possible the morning of your procedure.        OTHER INSTRUCTIONS  You will need a responsible adult at least 55 years of age to accompany you and drive you home.   This person must remain in the waiting room during your procedure.  Wear loose fitting clothing that is easily removed.  Leave jewelry and other valuables at home.  However, you may wish to bring a book to read or an iPod/MP3 player to listen to music as you wait for your procedure to start.  Remove all body piercing jewelry and leave at home.  Total time from sign-in until discharge is approximately 2-3 hours.  You should go home directly after  your procedure and rest.  You can resume normal activities the day after your procedure.  The day of your procedure you should not:   Drive   Make legal decisions   Operate machinery   Drink alcohol   Return to work  You will receive specific instructions about eating, activities and medications before you leave.    The above instructions have been reviewed and explained to me by   _______________________    I fully understand and can verbalize these instructions _____________________________ Date _________

## 2010-07-10 NOTE — Progress Notes (Signed)
Summary: refill/gg  Phone Note Refill Request  on Oct 24, 2009 11:17 AM  Refills Requested: Medication #1:  LOVASTATIN 20 MG TABS take 1 tablet by mouth once daily   Last Refilled: 09/23/2009  Method Requested: Electronic Initial call taken by: Merrie Roof RN,  Oct 24, 2009 11:17 AM

## 2010-07-10 NOTE — Progress Notes (Signed)
Summary: Refill/gh  Phone Note Refill Request Message from:  Fax from Pharmacy on November 26, 2009 5:19 PM  Refills Requested: Medication #1:  NEURONTIN 300 MG CAPS Take 2  tablets by mouth three times a day   Last Refilled: 10/19/2009  Method Requested: Electronic Initial call taken by: Angelina Ok RN,  November 26, 2009 5:20 PM    Prescriptions: NEURONTIN 300 MG CAPS (GABAPENTIN) Take 2  tablets by mouth three times a day  #180 Capsule x 3   Entered and Authorized by:   Laren Everts MD   Signed by:   Laren Everts MD on 11/28/2009   Method used:   Electronically to        [facility]  [address] [phone]* (retail)       [address]       [place], [place]  [id number]       Ph: [phone] or [phone]       Fax: [phone]   RxID:   [id number]

## 2010-07-10 NOTE — Letter (Signed)
Summary: [facility]  [facility]   Imported By: Margie Billet 10/29/2009 11:34:06  _____________________________________________________________________  External Attachment:    Type:   Image     Comment:   External Document

## 2010-07-10 NOTE — Consult Note (Signed)
Summary: [facility]  [facility]   Imported By: Margie Billet 03/27/2010 12:18:05  _____________________________________________________________________  External Attachment:    Type:   Image     Comment:   External Document

## 2010-07-21 ENCOUNTER — Other Ambulatory Visit: Payer: Self-pay | Admitting: Internal Medicine

## 2010-07-21 MED ORDER — GABAPENTIN 600 MG PO TABS: 600.0000 mg | ORAL_TABLET | Freq: Three times a day (TID) | ORAL | Status: DC

## 2010-07-31 ENCOUNTER — Ambulatory Visit (INDEPENDENT_AMBULATORY_CARE_PROVIDER_SITE_OTHER): Payer: Medicaid Other | Admitting: Internal Medicine

## 2010-07-31 ENCOUNTER — Encounter: Payer: Self-pay | Admitting: Internal Medicine

## 2010-07-31 DIAGNOSIS — E785 Hyperlipidemia, unspecified: Secondary | ICD-10-CM

## 2010-07-31 DIAGNOSIS — F341 Dysthymic disorder: Secondary | ICD-10-CM

## 2010-07-31 DIAGNOSIS — K219 Gastro-esophageal reflux disease without esophagitis: Secondary | ICD-10-CM

## 2010-07-31 DIAGNOSIS — I10 Essential (primary) hypertension: Secondary | ICD-10-CM

## 2010-07-31 MED ORDER — OMEPRAZOLE 20 MG PO CPDR: DELAYED_RELEASE_CAPSULE | ORAL | Status: DC

## 2010-07-31 NOTE — Progress Notes (Signed)
  Subjective:    Patient ID: Misty Decker, female    DOB: [birth year], 55 y.o.   MRN: [id number]  HPI  55 yr old woman with  Past Medical History  Diagnosis Date  . Sciatica   . Depression   . Fibromyalgia   . Encounter for preventive health examination     Next mammogram in 07/2009. Next colonoscopy in 09/2017. DEXA 4/09>Normal  . Encounter for screening colonoscopy 10/03/2007    Normal  . Hyperlipidemia   . Hypertension   . Degenerative joint disease     s/p cervical and lumbar fusions   comes to the clinic for 6 month follow up. Patient was feeling bloating and epigastric abdominal pain. Patient went to Dr. Arlyce Dice which instructed her to discontinue meloxicam and naproxen because symptoms was related to GERD. Dr. Arlyce Dice placed patient on celebrex and instructed to continue Omeprazole 20mg  po bid. Denies hematochezia, or melena.   Back pain stable. Will see Dr. Channing Mutters March.  Review of Systems  All other systems reviewed and are negative.       Objective:   Physical Exam  Vitals reviewed. Constitutional: She is oriented to person, place, and time. She appears well-developed and well-nourished.  HENT:  Mouth/Throat: Oropharynx is clear and moist.  Eyes: EOM are normal.  Neck: Neck supple.  Cardiovascular: Normal rate, regular rhythm and normal heart sounds.   Pulmonary/Chest: Effort normal and breath sounds normal.  Abdominal: Soft. Bowel sounds are normal.  Musculoskeletal: Normal range of motion.  Neurological: She is alert and oriented to person, place, and time.  Skin: No rash noted. No erythema.  Psychiatric: She has a normal mood and affect.          Assessment & Plan:

## 2010-07-31 NOTE — Patient Instructions (Signed)
Schedule a follow up appointment 3 months. Take all medications as directed. Stop taking NSAIDs. Please return to clinic at your earliest convenience fasting to check Lipid panel.

## 2010-08-01 ENCOUNTER — Encounter: Payer: Self-pay | Admitting: Internal Medicine

## 2010-08-01 NOTE — Assessment & Plan Note (Signed)
Stable  Continue current regimen  

## 2010-08-01 NOTE — Assessment & Plan Note (Signed)
Stable. Continue current regimen. Future order for Fasting Lipid Panel placed.

## 2010-08-01 NOTE — Assessment & Plan Note (Signed)
CT Abdomen/Pelvis after ultrasound showed: 1. Wall thickening of the gastric antrum may represent focal  gastritis.  2. No other acute or focal abnormality to explain the patient's  symptoms.  3. Status post L3-4 and L4-5 PLIF

## 2010-08-06 ENCOUNTER — Ambulatory Visit (HOSPITAL_COMMUNITY)
Admission: RE | Admit: 2010-08-06 | Discharge: 2010-08-06 | Disposition: A | Discharge status: Home / Self Care | Payer: Medicare Other | Source: Ambulatory Visit | Attending: Emergency Medicine | Admitting: Emergency Medicine

## 2010-08-06 DIAGNOSIS — Z1231 Encounter for screening mammogram for malignant neoplasm of breast: Secondary | ICD-10-CM | POA: Insufficient documentation

## 2010-08-21 LAB — CBC
MCH: 33.3 pg (ref 26.0–34.0)
Platelets: 197 10*3/uL (ref 150–400)
RBC: 4.63 MIL/uL (ref 3.87–5.11)
WBC: 10.3 10*3/uL (ref 4.0–10.5)

## 2010-08-21 LAB — URINALYSIS, ROUTINE W REFLEX MICROSCOPIC
Bilirubin Urine: NEGATIVE
Glucose, UA: NEGATIVE mg/dL
Hgb urine dipstick: NEGATIVE
Specific Gravity, Urine: 1.016 (ref 1.005–1.030)
Urobilinogen, UA: 0.2 mg/dL (ref 0.0–1.0)

## 2010-08-21 LAB — DIFFERENTIAL
Basophils Absolute: 0.1 10*3/uL (ref 0.0–0.1)
Basophils Relative: 1 % (ref 0–1)
Eosinophils Relative: 3 % (ref 0–5)
Monocytes Absolute: 0.7 10*3/uL (ref 0.1–1.0)
Neutro Abs: 7.3 10*3/uL (ref 1.7–7.7)
Neutrophils Relative %: 71 % (ref 43–77)

## 2010-08-21 LAB — COMPREHENSIVE METABOLIC PANEL
ALT: 27 U/L (ref 0–35)
BUN: 19 mg/dL (ref 6–23)
CO2: 25 mEq/L (ref 19–32)
Calcium: 9.5 mg/dL (ref 8.4–10.5)
Creatinine, Ser: 0.76 mg/dL (ref 0.4–1.2)
GFR calc non Af Amer: >60 mL/min (ref >60)
Glucose, Bld: 109 mg/dL — ABNORMAL HIGH (ref 70–99)
Total Protein: 7.2 g/dL (ref 6.0–8.3)

## 2010-09-04 ENCOUNTER — Other Ambulatory Visit: Payer: Medicare Other

## 2010-09-04 DIAGNOSIS — E785 Hyperlipidemia, unspecified: Secondary | ICD-10-CM

## 2010-09-04 LAB — LIPID PANEL
LDL Cholesterol: 102 mg/dL — ABNORMAL HIGH (ref 0–99)
Total CHOL/HDL Ratio: 4.1 Ratio
Triglycerides: 206 mg/dL — ABNORMAL HIGH (ref <150)
VLDL: 41 mg/dL — ABNORMAL HIGH (ref 0–40)

## 2010-09-15 ENCOUNTER — Other Ambulatory Visit: Payer: Self-pay | Admitting: Physician Assistant

## 2010-09-15 NOTE — Telephone Encounter (Signed)
Refill sent in for pt for Omeprazole.

## 2010-09-15 NOTE — Telephone Encounter (Signed)
Amy Esterwood PA saw this pt in 02/13/2010 and prescribed Omeprazole.  She is a Dr. Arlyce Dice patient and Amy said to route it so that it can be filled under Dr. Arlyce Dice.   Dr Arlyce Dice did an EGD 9/15 and saw the pt in the office on 10/24.

## 2010-10-16 ENCOUNTER — Other Ambulatory Visit (INDEPENDENT_AMBULATORY_CARE_PROVIDER_SITE_OTHER): Payer: Medicare Other | Admitting: *Deleted

## 2010-10-16 DIAGNOSIS — I10 Essential (primary) hypertension: Secondary | ICD-10-CM

## 2010-10-16 DIAGNOSIS — M1990 Unspecified osteoarthritis, unspecified site: Secondary | ICD-10-CM

## 2010-10-16 MED ORDER — AMITRIPTYLINE HCL 50 MG PO TABS: 50.0000 mg | ORAL_TABLET | Freq: Every day | ORAL | Status: DC

## 2010-10-22 ENCOUNTER — Encounter: Payer: Self-pay | Admitting: Internal Medicine

## 2010-10-22 ENCOUNTER — Ambulatory Visit (INDEPENDENT_AMBULATORY_CARE_PROVIDER_SITE_OTHER): Payer: Medicare Other | Admitting: Internal Medicine

## 2010-10-22 DIAGNOSIS — E785 Hyperlipidemia, unspecified: Secondary | ICD-10-CM

## 2010-10-22 DIAGNOSIS — I10 Essential (primary) hypertension: Secondary | ICD-10-CM

## 2010-10-22 LAB — BASIC METABOLIC PANEL
Calcium: 9.8 mg/dL (ref 8.4–10.5)
Chloride: 103 mEq/L (ref 96–112)
Creat: 0.76 mg/dL (ref 0.40–1.20)

## 2010-10-22 MED ORDER — LOVASTATIN 20 MG PO TABS: 20.0000 mg | ORAL_TABLET | Freq: Every day | ORAL | Status: DC

## 2010-10-22 MED ORDER — HYDROCHLOROTHIAZIDE 25 MG PO TABS: 25.0000 mg | ORAL_TABLET | Freq: Every day | ORAL | Status: DC

## 2010-10-22 NOTE — Patient Instructions (Addendum)
Stop in the lab to have your blood drawn and if there is anything we need to follow up on I will call you.  Keep your appointment with Dr. Channing Mutters for your back pain follow up.  Follow up here in 3-6 months.

## 2010-10-22 NOTE — Progress Notes (Signed)
  Subjective:    Patient ID: Misty Decker, female    DOB: [birth year], 55 y.o.   MRN: [id number]  HPI  Misty Decker is a 55 year old woman here today for a refill of her HCTZ and lovastatin.  She has been taking her medications as directed and has no side effects that she has noted.  She has no other complaints today.    She last had her FLP panel done in March and her LDL was within goal at that time.  Since she has been at goal for sometime with the same dose of lovastatin we will monitor her lipids on a yearly basis.  Her last lab work for HCTZ was done in September 2011 so she is due for monitoring Bmet at this time.  Other health maintenance items are up to date except for her Tetanus.  She is not a candidate for Pap smears because she had a hysterectomy in 1988 because of uterine prolapse.  She had a mammogram in Feb 2012 and a colonoscopy in 2009.    Review of Systems    Constitutional: Denies fever, chills, diaphoresis, appetite change and fatigue.  HEENT: Denies photophobia, eye pain, redness, hearing loss, ear pain, congestion, sore throat, rhinorrhea, sneezing, mouth sores, trouble swallowing, neck pain, neck stiffness and tinnitus.   Respiratory: Denies SOB, DOE, cough, chest tightness,  and wheezing.   Cardiovascular: Denies chest pain, palpitations and leg swelling.  Gastrointestinal: Denies nausea, vomiting, abdominal pain, diarrhea, constipation, blood in stool and abdominal distention.  Genitourinary: Denies dysuria, urgency, frequency, hematuria, flank pain and difficulty urinating.  Musculoskeletal: Denies myalgias, back pain, joint swelling, arthralgias and gait problem.  Skin: Denies pallor, rash and wound.  Neurological: Denies dizziness, seizures, syncope, weakness, light-headedness, numbness and headaches.  Hematological: Denies adenopathy. Easy bruising, personal or family bleeding history  Psychiatric/Behavioral: Denies suicidal ideation, mood changes, confusion,  nervousness, sleep disturbance and agitation  Objective:   Physical Exam    Constitutional: Vital signs reviewed.  Patient is a well-developed and well-nourished woman in no acute distress and cooperative with exam. Alert and oriented x3.  Head: Normocephalic and atraumatic Ear: TM normal bilaterally Mouth: no erythema or exudates, MMM Eyes: PERRL, EOMI, conjunctivae normal, No scleral icterus.  Neck: Supple, Trachea midline normal ROM, No JVD, mass, thyromegaly, or carotid bruit present.  Cardiovascular: RRR, S1 normal, S2 normal, no MRG, pulses symmetric and intact bilaterally Pulmonary/Chest: CTAB, no wheezes, rales, or rhonchi Abdominal: Soft. Non-tender, non-distended, bowel sounds are normal, no masses, organomegaly, or guarding present.  GU: no CVA tenderness Musculoskeletal: No joint deformities, erythema, or stiffness, ROM full and no nontender.  Scarring in the cervical spine and lumbar spine from previous surgeries noted on exam.   Hematology: no cervical, inginal, or axillary adenopathy.  Neurological: A&O x3, Strength is normal and symmetric bilaterally, cranial nerve II-XII are grossly intact, no focal motor deficit, sensory intact to light touch bilaterally.  Skin: Warm, dry and intact. No rash, cyanosis, or clubbing.  Psychiatric: Normal mood and affect. speech and behavior is normal. Judgment and thought content normal. Cognition and memory are normal.    Assessment & Plan:

## 2010-10-22 NOTE — Assessment & Plan Note (Signed)
Lab Results  Component Value Date   CHOL 189 09/04/2010   CHOL 159 12/21/2008   CHOL 186 02/23/2008   Lab Results  Component Value Date   HDL 46 09/04/2010   HDL 53 01/16/9146   HDL 59 02/04/5620   Lab Results  Component Value Date   LDLCALC 102* 09/04/2010   LDLCALC 88 12/21/2008   LDLCALC 93 02/23/2008   Lab Results  Component Value Date   TRIG 206* 09/04/2010   TRIG 91 12/21/2008   TRIG 168* 02/23/2008   Lab Results  Component Value Date   CHOLHDL 4.1 09/04/2010   CHOLHDL 3.0 Ratio 12/21/2008   CHOLHDL 3.2 Ratio 02/23/2008   No results found for this basename: LDLDIRECT   Her LDL is well controlled and has been since 2009.  She has been on the same dose of lovastatin so she only needs yearly follow up which will be due in March 2013.

## 2010-10-22 NOTE — Assessment & Plan Note (Signed)
BP Readings from Last 3 Encounters:  10/22/10 124/86  07/31/10 139/89  03/31/10 102/62   Blood pressure is under excellent control today.  Given her risk factors her goal is <140/90.  She is due for a Bmet to monitor her electrolytes with the diuretic use and we will do that today.

## 2010-10-24 NOTE — Op Note (Signed)
NAME:  Misty Decker, Misty Decker                        ACCOUNT NO.:  [id number]   MEDICAL RECORD NO.:  [id number]                   PATIENT TYPE:  INP   LOCATION:  NA                                   FACILITY:  [facility]   PHYSICIAN:  Payton Doughty, M.D.                   DATE OF BIRTH:  06-22-1955   DATE OF PROCEDURE:  04/24/2003  DATE OF DISCHARGE:                                 OPERATIVE REPORT   PREOPERATIVE DIAGNOSIS:  Displaced Isola screw on the right side at the L4.   POSTOPERATIVE DIAGNOSIS:  Displaced Isola screw on the right side at the L4.   OPERATION/PROCEDURE:  Replacement of Isola hardware with 90 D  instrumentation.   SURGEON:  Payton Doughty, M.D.   ANESTHESIA:  General endotracheal.   PREP:  General prep with alcohol and wipe.   COMPLICATIONS:  None.   NURSE:  [place].   INDICATIONS/DESCRIPTION OF PROCEDURE:  A 55 year old woman with fusion  numerous years ago with a cut out screw and back pain on the right side at  L4, taken to the operating room and given general anesthesia and intubated.  Placed prone on the operating table.  Following shave, prep and drape in the  usual sterile fashion, the skin was infiltrated with 1% lidocaine, 1:100,000  epinephrine.  The old hardware was dissected free through the old incision.  The left-sided hardware was intact.  It was carefully inspected.  It was  replaced with 90 D instrumentation because it is made of titanium.  On the  right side, instrumentation was also removed.  The right-sided L4 screw had  cut out slightly.  There was no appreciable motion at 4-5.  The hardware was  removed.  New Isola instrumentation was placed at 5 and 1.  At L4, a new  trajectory was placed more within the pedicle.  Intraoperative x-ray showed  good placement of the right L4 screw.  It was all screws were attached to  the rod and locking caps attached and tightened.  Intraoperative x-rays  showed good placement of the screws.  There was no  appreciable motion and  the intertransverse bone appeared to be adequate.  The wound was irrigated  and hemostasis was assured.  The fascia was reapproximated with 0 Vicryl in  an interrupted fashion.  The subcutaneous tissues were reapproximated with 0  Vicryl in an interrupted fashion.  The subcutaneous tissues were  approximated with 3-0 Vicryl in an interrupted fashion.  The skin was closed  with 3-0 nylon in a running locked fashion.  Betadine and Telfa dressing was  applied, __________ with Op-Site and the patient returned to the recovery  room in good condition.  Payton Doughty, M.D.    MWR/MEDQ  D:  04/24/2003  T:  04/24/2003  Job:  [id number]

## 2010-10-24 NOTE — Op Note (Signed)
NAME:  SAHORY, NORDLING              ACCOUNT NO.:  [id number]   MEDICAL RECORD NO.:  [id number]          PATIENT TYPE:  AMB   LOCATION:  [facility]                         FACILITY:  [facility]   PHYSICIAN:  Jene Every, M.D.    DATE OF BIRTH:  21-Dec-1955   DATE OF PROCEDURE:  DATE OF DISCHARGE:                                 OPERATIVE REPORT   PREOPERATIVE DIAGNOSIS:  Medial meniscus tear, left knee.   POSTOPERATIVE DIAGNOSIS:  Medial meniscus tear, left knee.   PROCEDURE PERFORMED:  Left knee arthroscopy with posterior medial  meniscectomy.   ANESTHESIA:  General.   ASSISTANT:  None.   BRIEF HISTORY/INDICATIONS:  This is a 55 year old with pain, locking, and  giving way.  MRI indicating complex tear of the posterior horn of the medial  meniscus.  Operative intervention was indicated for a posterior medial  meniscectomy to alleviate mechanical obstruction.  Risks and benefits were  discussed, including bleeding, infection, DVT, PE, anesthetic complications,  no change in her symptoms, worsening symptoms, etc.   TECHNIQUE:  With the patient in supine position after the induction of  adequate general anesthesia and 1 gm of Kefzol, the left lower extremity was  prepped and draped in the usual sterile fashion.  Lateral parapatellar  portal and a superior medial parapatellar portal was fashioned with a #11  blade.  The cannula was inserted.  Irrigant utilized to insufflate the  joint.  Under direct visualization, an 18 gauge needle was utilized to  localize the medial portal, sparing the medial meniscus, fashioned with a  #11 blade.  Inspection revealed normal patellofemoral tracking.  There are  minor grade changes of the patella.  The gutters are unremarkable.  Lateral  meniscus is without evidence of tear.  Stable to probe palpation.  The  femoral condyle and tibial plateau there were unremarkable as well.  The ACL  and PCL were unremarkable.  Complex tear of the posterior third of the  medial meniscus is noted.  It was a complex tear.  Multiple planes unstable.  Introduced the basket and resected it to a stable base, further contoured  with a #4-2 __________  shaver.  The remnant was stable without displacement  but cleaved.  This was in the inner two-thirds.  __________  resection, the  remainder was stable to probe palpation and copiously lavaged.  The knee was  examined in all compartments, including the gutters, and there was no  residual loose debris or unstable meniscus.  All instrumentation was  removed.  Portals were closed with 4-0 nylon simple suture and 0.25%  Marcaine with epinephrine was infiltrated into the joint.  The wound was  dressed sterilely.  She was awakened without difficulty.  Transported to the  recovery room in satisfactory condition.   The patient tolerated the procedure well without any complications.    JB/MEDQ  D:  11/05/2004  T:  11/05/2004  Job:  [id number]
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
## 2010-10-24 NOTE — Op Note (Signed)
NAME:  Misty Decker, Misty Decker                        ACCOUNT NO.:  [id number]   MEDICAL RECORD NO.:  [id number]                   PATIENT TYPE:  INP   LOCATION:  3011                                 FACILITY:  [facility]   PHYSICIAN:  Payton Doughty, M.D.                   DATE OF BIRTH:  04-Oct-1955   DATE OF PROCEDURE:  10/09/2003  DATE OF DISCHARGE:                                 OPERATIVE REPORT   PREOPERATIVE DIAGNOSIS:  Herniated disk and spondylosis, C5-6 and C6-7.   POSTOPERATIVE DIAGNOSIS:  Herniated disk and spondylosis, C5-6 and C6-7.   OPERATION PERFORMED:  C5-6 and C6-7 anterior cervical decompression and  fusion with a Reflex Hybrid plate.   SURGEON:  Payton Doughty, M.D.   NURSE ASSISTANT:  [facility].   DOCTOR ASSISTANT:  Clydene Fake, M.D.   ANESTHESIA:  General endotracheal.   PREP:  Sterile Betadine prep and scrub with alcohol wipe.   COMPLICATIONS:  None.   INDICATIONS FOR PROCEDURE:  The patient is a 56 year old female with a right  C6 and 7 radiculopathy.   DESCRIPTION OF PROCEDURE:  The patient was taken to the operating room,  smoothly anesthetized, intubated, and placed supine on the operating table  in the halter head traction with the neck extended.  Following shave, prep  and drape in the usual sterile fashion, skin was incised from the midline to  the medial border of the sternocleidomastoid muscle on the left side.  The  platysma was identified, elevated, divided and undermined.  The  sternocleidomastoid was identified and medial dissection revealed the  carotid artery which we retract laterally to the left.  Trachea and  esophagus retracted laterally to the right exposing the bones of the  anterior spine.  A marker was placed.  Intraoperative x-ray was obtained to  confirm correctness of level.  Having confirmed correctness of level, the  longus colli was taken down bilaterally and the Shadowline retractor placed.  The C5-6 and C6-7 disks were then removed  under gross observation.  The  operating microscope was then brought in and we used microdissection  technique to remove the remaining disk, removed the posterior annulus,  divided the posterior longitudinal ligament and dissecting to explore the  anterior epidural space.  At 5-6 and 6-7 there was a fair amount of bone  spur as well as disk.  These were removed without difficulty.  They were  mostly off to the right side.  Having completed decompression of the right  C6 and C7 nerve root and exploring the left side, 7 mm bone grafts were  fashioned from patellar allograft and tapped into place.  A Reflex Hybrid  plate was then placed using 12 mm screws, two in C5, two in C6 and two in  C7.  Intraoperative x-ray showed good placement of bone graft, plate and  screws.  Wound was irrigated and hemostasis assured.  The  platysma was  reapproximated with 3-0 Vicryl in interrupted fashion,  subcutaneous tissues were reapproximated with 3-0 Vicryl in interrupted  fashion.  Skin was closed with 4-0 Vicryl in running subcuticular fashion.  Benzoin and Steri-Strips were placed and made occlusive with Telfa and  OpSite.  The patient was placed in an Aspen collar and returned to recovery  room in good condition.                                               Payton Doughty, M.D.    MWR/MEDQ  D:  10/09/2003  T:  10/10/2003  Job:  [id number]
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
## 2010-10-24 NOTE — Discharge Summary (Signed)
NAME:  Misty Decker, Misty Decker                        ACCOUNT NO.:  [id number]   MEDICAL RECORD NO.:  [id number]                   PATIENT TYPE:  INP   LOCATION:  3001                                 FACILITY:  [facility]   PHYSICIAN:  Payton Doughty, M.D.                   DATE OF BIRTH:  [birth year]   DATE OF ADMISSION:  04/24/2003  DATE OF DISCHARGE:  04/27/2003                                 DISCHARGE SUMMARY   ADMISSION DIAGNOSIS:  Displaced Isola screw on the right at L4.   DISCHARGE DIAGNOSIS:  Displaced Isola screw on the right at L4.   PROCEDURES:  Revision of hardware and replacement with 90D instrumentation.   COMPLICATIONS:  None.   DISCHARGE STATUS:  Alive and well.   HISTORY OF PRESENT ILLNESS:  A 55 year old, right-handed, white girl whose  history and physical has been recounted on the chart.  She had a fusion  numerous years ago with displaced right L4 screw.  She is now admitted for  revision.   PAST MEDICAL HISTORY:  Benign.   NEUROLOGIC EXAMINATION:  Intact.   HOSPITAL COURSE:  She was admitted after ascertainment of normal laboratory  values and underwent revision of her hardware.  Postoperatively she has done  well and convalesced for one day with the Foley out.  One day with the PCA.  She is now up and about on oral pain medications, eating, and voiding  normally.  The incision is dry and well healing.   DISPOSITION:  She is being discharged home in the care of her family.   DISCHARGE MEDICATIONS:  Darvocet for pain and five days of ciprofloxacin.   FOLLOWUP:  Her followup will be in the [facility]  office in about a week for suture removal.                                                Payton Doughty, M.D.    MWR/MEDQ  D:  04/27/2003  T:  04/28/2003  Job:  [id number]

## 2010-11-24 ENCOUNTER — Other Ambulatory Visit: Payer: Self-pay | Admitting: Internal Medicine

## 2010-11-25 MED ORDER — GABAPENTIN 600 MG PO TABS: 600.0000 mg | ORAL_TABLET | Freq: Three times a day (TID) | ORAL | Status: DC

## 2010-12-23 ENCOUNTER — Ambulatory Visit (HOSPITAL_COMMUNITY)
Admission: RE | Admit: 2010-12-23 | Discharge: 2010-12-23 | Disposition: A | Discharge status: Home / Self Care | Payer: Medicare Other | Source: Ambulatory Visit | Attending: Neurosurgery | Admitting: Neurosurgery

## 2010-12-23 ENCOUNTER — Other Ambulatory Visit (HOSPITAL_COMMUNITY): Payer: Self-pay | Admitting: Neurosurgery

## 2010-12-23 DIAGNOSIS — R52 Pain, unspecified: Secondary | ICD-10-CM

## 2010-12-23 DIAGNOSIS — M542 Cervicalgia: Secondary | ICD-10-CM | POA: Insufficient documentation

## 2010-12-24 ENCOUNTER — Other Ambulatory Visit: Payer: Self-pay | Admitting: Internal Medicine

## 2010-12-30 ENCOUNTER — Other Ambulatory Visit: Payer: Self-pay | Admitting: Gastroenterology

## 2011-01-28 ENCOUNTER — Ambulatory Visit (INDEPENDENT_AMBULATORY_CARE_PROVIDER_SITE_OTHER): Payer: Medicare Other | Admitting: Internal Medicine

## 2011-01-28 ENCOUNTER — Encounter: Payer: Medicare Other | Admitting: Internal Medicine

## 2011-01-28 ENCOUNTER — Other Ambulatory Visit: Payer: Self-pay | Admitting: *Deleted

## 2011-01-28 ENCOUNTER — Encounter: Payer: Self-pay | Admitting: Internal Medicine

## 2011-01-28 DIAGNOSIS — E785 Hyperlipidemia, unspecified: Secondary | ICD-10-CM

## 2011-01-28 DIAGNOSIS — IMO0001 Reserved for inherently not codable concepts without codable children: Secondary | ICD-10-CM

## 2011-01-28 DIAGNOSIS — M797 Fibromyalgia: Secondary | ICD-10-CM

## 2011-01-28 DIAGNOSIS — K219 Gastro-esophageal reflux disease without esophagitis: Secondary | ICD-10-CM

## 2011-01-28 DIAGNOSIS — I10 Essential (primary) hypertension: Secondary | ICD-10-CM

## 2011-01-28 MED ORDER — CELECOXIB 100 MG PO CAPS: 100.0000 mg | ORAL_CAPSULE | Freq: Every day | ORAL | Status: DC

## 2011-01-28 MED ORDER — GABAPENTIN 600 MG PO TABS: 600.0000 mg | ORAL_TABLET | Freq: Three times a day (TID) | ORAL | Status: DC

## 2011-01-28 MED ORDER — AMITRIPTYLINE HCL 50 MG PO TABS: 50.0000 mg | ORAL_TABLET | Freq: Every day | ORAL | Status: DC

## 2011-01-28 MED ORDER — CYCLOBENZAPRINE HCL 5 MG PO TABS: ORAL_TABLET | ORAL | Status: DC

## 2011-01-28 MED ORDER — OMEPRAZOLE 20 MG PO CPDR: 20.0000 mg | DELAYED_RELEASE_CAPSULE | Freq: Every day | ORAL | Status: DC

## 2011-01-28 NOTE — Patient Instructions (Signed)
Please schedule a follow up appointment with your PCP in 1-2 months. Please take your medicines as prescribed. 

## 2011-01-28 NOTE — Assessment & Plan Note (Signed)
Continue current regimen

## 2011-01-28 NOTE — Assessment & Plan Note (Signed)
Her prescription for Neurontin and amitriptyline were refilled.

## 2011-01-28 NOTE — Telephone Encounter (Signed)
Pt here for visit with md, medication refilled at visit.Misty Spittle Cassady8/22/20123:25 PM

## 2011-01-28 NOTE — Progress Notes (Signed)
  Subjective:    Patient ID: Misty Decker, female    DOB: [birth year], 55 y.o.   MRN: [id number]  HPI: 55 year old woman with past medical history significant for fibromyalgia, degenerative disc disease comes to the clinic for followup visit.   Denies any complaints including chest pain, palpitations, shortness of breath, nausea, vomiting or diarrhea.  She is requesting medication refill.    Review of Systems  Constitutional: Negative for fever, chills, activity change, appetite change and fatigue.  HENT: Negative for nosebleeds, congestion, sore throat, rhinorrhea, sneezing, trouble swallowing, postnasal drip and sinus pressure.   Eyes: Negative for photophobia and visual disturbance.  Respiratory: Negative for apnea, cough, choking, chest tightness, shortness of breath, wheezing and stridor.   Cardiovascular: Negative for chest pain, palpitations and leg swelling.  Gastrointestinal: Negative for nausea, abdominal pain, diarrhea and constipation.  Genitourinary: Negative for urgency, frequency and hematuria.  Musculoskeletal: Positive for arthralgias.  Neurological: Negative for facial asymmetry, light-headedness, numbness and headaches.  Hematological: Negative for adenopathy.  Psychiatric/Behavioral: Negative for agitation.       Objective:   Physical Exam  Constitutional: She is oriented to person, place, and time. She appears well-developed and well-nourished. No distress.  HENT:  Head: Normocephalic and atraumatic.  Eyes: Conjunctivae and EOM are normal. Pupils are equal, round, and reactive to light.  Neck: Normal range of motion. Neck supple. No JVD present. No tracheal deviation present. No thyromegaly present.  Cardiovascular: Normal rate, regular rhythm, normal heart sounds and intact distal pulses.  Exam reveals no gallop and no friction rub.   No murmur heard. Pulmonary/Chest: Effort normal and breath sounds normal. No stridor. No respiratory distress. She has no  wheezes. She has no rales. She exhibits no tenderness.  Abdominal: Soft. Bowel sounds are normal. She exhibits no distension and no mass. There is no tenderness. There is no rebound and no guarding.  Musculoskeletal: Normal range of motion. She exhibits no edema and no tenderness.  Lymphadenopathy:    She has no cervical adenopathy.  Neurological: She is alert and oriented to person, place, and time. She has normal reflexes. She displays normal reflexes. No cranial nerve deficit. Coordination normal.  Skin: Skin is warm. She is not diaphoretic.          Assessment & Plan:

## 2011-01-28 NOTE — Assessment & Plan Note (Signed)
Lab Results  Component Value Date   NA 141 10/22/2010   K 4.0 10/22/2010   CL 103 10/22/2010   CO2 26 10/22/2010   BUN 19 10/22/2010   CREATININE 0.76 10/22/2010   CREATININE 0.76 02/08/2010    BP Readings from Last 3 Encounters:  01/28/11 130/78  10/22/10 124/86  07/31/10 139/89    Assessment: Hypertension control:  controlled  Progress toward goals:  at goal Barriers to meeting goals:  no barriers identified  Plan: Hypertension treatment:  continue current medications

## 2011-02-27 ENCOUNTER — Ambulatory Visit (INDEPENDENT_AMBULATORY_CARE_PROVIDER_SITE_OTHER): Payer: Medicare Other | Admitting: *Deleted

## 2011-02-27 DIAGNOSIS — Z23 Encounter for immunization: Secondary | ICD-10-CM

## 2011-03-27 ENCOUNTER — Other Ambulatory Visit: Payer: Self-pay | Admitting: Internal Medicine

## 2011-03-27 NOTE — Telephone Encounter (Signed)
Pt was to have F/U this month but was never given appt. Sent to front desk to sch appt with PCP next 60 days.

## 2011-05-05 ENCOUNTER — Encounter: Payer: Self-pay | Admitting: Internal Medicine

## 2011-05-05 ENCOUNTER — Ambulatory Visit (INDEPENDENT_AMBULATORY_CARE_PROVIDER_SITE_OTHER): Payer: Medicare Other | Admitting: Internal Medicine

## 2011-05-05 DIAGNOSIS — M549 Dorsalgia, unspecified: Secondary | ICD-10-CM

## 2011-05-05 DIAGNOSIS — Z9889 Other specified postprocedural states: Secondary | ICD-10-CM | POA: Insufficient documentation

## 2011-05-05 DIAGNOSIS — M1990 Unspecified osteoarthritis, unspecified site: Secondary | ICD-10-CM

## 2011-05-05 DIAGNOSIS — F341 Dysthymic disorder: Secondary | ICD-10-CM

## 2011-05-05 DIAGNOSIS — I10 Essential (primary) hypertension: Secondary | ICD-10-CM

## 2011-05-05 DIAGNOSIS — Z23 Encounter for immunization: Secondary | ICD-10-CM

## 2011-05-05 DIAGNOSIS — E785 Hyperlipidemia, unspecified: Secondary | ICD-10-CM

## 2011-05-05 NOTE — Assessment & Plan Note (Signed)
Currently stable Will continue amitriptyline 50 mg each bedtime. Patient has consulted with Rosendo Gros  in the past and was advised to contact the clinic if we can be of further assistance.

## 2011-05-05 NOTE — Assessment & Plan Note (Signed)
Continue with current therapy of Flexeril and gabapentin.  Pt reports that celebrex causes flares of her GERD thus has stopped using it.  Pt will f/u with Dr. Channing Mutters (Neurosurgeon) if [facility] in January.  Will f/u with me in 3-6 months.

## 2011-05-05 NOTE — Assessment & Plan Note (Signed)
Currently under good control today 127/82 with pulse of 100. Will continue current therapy of hydrochlorothiazide 25 mg daily

## 2011-05-05 NOTE — Assessment & Plan Note (Addendum)
Will continue current therapy of lovastatin 20 mg daily, fish oil capsule daily,  in addition to diet and exercise. Last lipid panel 8 months ago with LDL of 102, HDL of 46, and triglycerides in the 180s. Will have another lipid panel at followup visit in 3-6 months.

## 2011-05-05 NOTE — Patient Instructions (Signed)
It was nice to meet you today.  Please continue to take your medications as prescribed.  Today you will receive the tetanus/whooping cough shot. Return for a follow-up with me in 3-6 months.  At that time we will check your cholesterol and other blood work.

## 2011-05-05 NOTE — Progress Notes (Signed)
  Subjective:    Patient ID: Misty Decker, female    DOB: 1956/04/16, 55 y.o.   MRN: [id number]  HPI  Patient presents with her husband for followup of chronic back pain, hypertension, hyperlipidemia, and depression and  anxiety. She reports no new complaints at this time. She is under the care of Dr. Channing Mutters of [facility] [place]. She has a history of prior cervical and lumbar vertebra fusion and has been advised that she needs repeat fusion surgery on her lower spine. Patient reports this has been delayed due to to her husband's current medical conditions. Of note he also has spinal disease and is currently wearing a hard neck brace. She reports that she is under continued stress due to to her husband's current condition and his inability to read nor write. This is compounded by the fact that they are having issues with their landlord. She reports that she is to meet with [employer] but is concerned they would try to [place] the country[place] into the city of which she is not familiar nor comfortable.  Review of Systems As per history of present illness otherwise negative    Objective:   Physical Exam  Constitutional: She is oriented to person, place, and time. She appears well-developed and well-nourished. No distress.  HENT:  Head: Normocephalic and atraumatic.  Eyes: Conjunctivae and EOM are normal. Pupils are equal, round, and reactive to light.  Neck: Neck supple. No muscular tenderness present. No rigidity. Normal range of motion present.  Neurological: She is alert and oriented to person, place, and time.  Skin: Skin is warm and dry.  Psychiatric: She has a normal mood and affect. Her speech is normal and behavior is normal. Judgment and thought content normal.       Patient expressions much concern over her home stressors.           Assessment & Plan:  #1 back pain: Patient is followed by Dr. Channing Mutters of [facility]  with a scheduled followup in January 2013. Will continue with current management of gabapentin and Flexeril. Of note patient reports that Celebrex causes her to have increased reflux thus has not been taking it  #2 anxiety/depression: Patient reports that her mood has been stable and but is stressed by financial concerns and issues with recurrent landlord. She is to meet with [employer] tomorrow. I told her to let the clinic notes if we can be of service in helping with documentation. Plan to continue with Elavil to assist with anxiety and sleep.  #3 hypertension: Is well-controlled on hydrochlorothiazide 25 mg daily. We'll continue to monitor and have patient followup in 36 months  #4 hyperlipidemia: Last lipid panel with LDL of 102. We'll continue current dosage of lovastatin 20 mg daily. Recheck lipid panel at next followup in 3-6 months.  #5 preventive health care: Patient will receive tDAP today. Received flu vaccination in September 2012.

## 2011-05-22 ENCOUNTER — Other Ambulatory Visit: Payer: Self-pay | Admitting: Internal Medicine

## 2011-05-22 DIAGNOSIS — M797 Fibromyalgia: Secondary | ICD-10-CM

## 2011-05-22 MED ORDER — GABAPENTIN 600 MG PO TABS: 600.0000 mg | ORAL_TABLET | Freq: Three times a day (TID) | ORAL | Status: DC

## 2011-06-10 ENCOUNTER — Other Ambulatory Visit: Payer: Self-pay | Admitting: Internal Medicine

## 2011-06-12 ENCOUNTER — Other Ambulatory Visit: Payer: Self-pay | Admitting: *Deleted

## 2011-06-12 DIAGNOSIS — M797 Fibromyalgia: Secondary | ICD-10-CM

## 2011-06-12 MED ORDER — AMITRIPTYLINE HCL 50 MG PO TABS: 50.0000 mg | ORAL_TABLET | Freq: Every day | ORAL | Status: DC

## 2011-07-08 ENCOUNTER — Other Ambulatory Visit: Payer: Self-pay | Admitting: Internal Medicine

## 2011-07-08 ENCOUNTER — Other Ambulatory Visit: Payer: Self-pay | Admitting: Family Medicine

## 2011-07-08 DIAGNOSIS — Z1231 Encounter for screening mammogram for malignant neoplasm of breast: Secondary | ICD-10-CM

## 2011-07-21 ENCOUNTER — Other Ambulatory Visit: Payer: Self-pay | Admitting: Internal Medicine

## 2011-08-06 ENCOUNTER — Ambulatory Visit (HOSPITAL_COMMUNITY)
Admission: RE | Admit: 2011-08-06 | Discharge: 2011-08-06 | Disposition: A | Discharge status: Home / Self Care | Payer: Medicare Other | Source: Ambulatory Visit | Attending: Family Medicine | Admitting: Family Medicine

## 2011-08-06 DIAGNOSIS — Z1231 Encounter for screening mammogram for malignant neoplasm of breast: Secondary | ICD-10-CM

## 2011-08-11 ENCOUNTER — Encounter: Payer: Self-pay | Admitting: Internal Medicine

## 2011-08-11 ENCOUNTER — Ambulatory Visit (INDEPENDENT_AMBULATORY_CARE_PROVIDER_SITE_OTHER): Payer: Medicare Other | Admitting: Internal Medicine

## 2011-08-11 DIAGNOSIS — Z9889 Other specified postprocedural states: Secondary | ICD-10-CM

## 2011-08-11 DIAGNOSIS — I10 Essential (primary) hypertension: Secondary | ICD-10-CM

## 2011-08-11 DIAGNOSIS — Z5181 Encounter for therapeutic drug level monitoring: Secondary | ICD-10-CM

## 2011-08-11 DIAGNOSIS — E785 Hyperlipidemia, unspecified: Secondary | ICD-10-CM

## 2011-08-11 DIAGNOSIS — M549 Dorsalgia, unspecified: Secondary | ICD-10-CM

## 2011-08-11 DIAGNOSIS — F341 Dysthymic disorder: Secondary | ICD-10-CM

## 2011-08-11 MED ORDER — GABAPENTIN 600 MG PO TABS: 600.0000 mg | ORAL_TABLET | Freq: Three times a day (TID) | ORAL | Status: DC

## 2011-08-11 MED ORDER — CYCLOBENZAPRINE HCL 5 MG PO TABS: 5.0000 mg | ORAL_TABLET | Freq: Every evening | ORAL | Status: DC | PRN

## 2011-08-11 NOTE — Patient Instructions (Signed)
It was nice to see you again , Misty Decker.  We will check your blood work today for cholesterol and electrolytes.  If we need to adjust your medications, we will contact you.  Keep your appointment with Dr. Channing Mutters.  Follow-up with me in 4 months.

## 2011-08-12 LAB — BASIC METABOLIC PANEL
BUN: 16 mg/dL (ref 6–23)
CO2: 28 mEq/L (ref 19–32)
Calcium: 9.9 mg/dL (ref 8.4–10.5)
Creat: 0.89 mg/dL (ref 0.50–1.10)
Glucose, Bld: 87 mg/dL (ref 70–99)

## 2011-08-12 LAB — LIPID PANEL
Cholesterol: 177 mg/dL (ref 0–200)
HDL: 53 mg/dL (ref >39)
LDL Cholesterol: 92 mg/dL (ref 0–99)
Total CHOL/HDL Ratio: 3.3 Ratio
Triglycerides: 162 mg/dL — ABNORMAL HIGH (ref <150)

## 2011-08-12 NOTE — Assessment & Plan Note (Signed)
Stable on flexeril and gabapentin

## 2011-08-12 NOTE — Progress Notes (Signed)
Subjective:     Patient ID: Misty Decker, female   DOB: 16-Nov-1955, 56 y.o.   MRN: [id number]  HPI Pt presents for f/u hypertension, hyperlipidemia, anxiety/depression and back pain.  She reports doing well with improvement in mood since starting OTC glycine.  Her back pain has been stable and she has an upcoming appointment with Orthopaedist Dr. Channing Mutters of [facility] in [place], [place].  No complaints today except feeling like "a bubble is in her head" since falling last July 2012.  Review of Systems  Constitutional: Negative for fever and unexpected weight change.  HENT: Negative for neck pain and neck stiffness.   Eyes: Negative for visual disturbance.  Respiratory: Negative for shortness of breath.   Cardiovascular: Negative for chest pain and leg swelling.  Genitourinary: Negative for dysuria and hematuria.  Musculoskeletal: Positive for back pain.  Neurological: Negative for headaches.  Psychiatric/Behavioral: Negative for dysphoric mood.       Objective:   Physical Exam  Constitutional: She is oriented to person, place, and time. She appears well-developed and well-nourished. No distress.       Pleasant, Husband present  HENT:  Head: Normocephalic and atraumatic.  Eyes: EOM are normal. Pupils are equal, round, and reactive to light.  Neck: Normal range of motion. Neck supple. No thyromegaly present.  Cardiovascular: Normal rate, regular rhythm, normal heart sounds and intact distal pulses.   No murmur heard. Pulmonary/Chest: Effort normal and breath sounds normal.  Abdominal: Soft. Bowel sounds are normal.  Musculoskeletal: Normal range of motion. She exhibits no edema and no tenderness.  Lymphadenopathy:    She has no cervical adenopathy.  Neurological: She is alert and oriented to person, place, and time. No cranial nerve deficit.  Skin: Skin is warm and dry.  Psychiatric: She has a normal mood and affect. Her behavior is normal. Thought content normal.       Assessment:       1. Hypertension: stable and controlled 138/84 pulse 88bpm today on hctz 25 mg qd 2. Hyperlipidemia: at goal with LDL 92 on lovastatin 20 mg qd and fish oil 3. Back pain: stable, cont flexeril and  Gabapentin per Dr. Lucina Mellow 4. Anxiety/Depression: improved and stable on amitriptyline and otc glycine    Plan:     See problem list

## 2011-08-12 NOTE — Assessment & Plan Note (Signed)
Controlled with mild elevation today 138 sbp.  Will continue single therapy with hctz 25 mg qd.

## 2011-08-12 NOTE — Assessment & Plan Note (Signed)
Lipid panel 08/11/2011 chol 177, TG 162, HDL 53, LDL 92, will continue current lovastatin therapy 20 mg qd with fish oil caps

## 2011-08-12 NOTE — Assessment & Plan Note (Signed)
Reports improved "spirits" since adding otc glycine.  Will continue with amitriptyline.

## 2011-08-12 NOTE — Assessment & Plan Note (Signed)
To flu with Dr. Channing Mutters in April

## 2011-09-14 ENCOUNTER — Other Ambulatory Visit: Payer: Self-pay | Admitting: Internal Medicine

## 2011-09-25 ENCOUNTER — Other Ambulatory Visit: Payer: Self-pay | Admitting: Internal Medicine

## 2011-09-28 ENCOUNTER — Other Ambulatory Visit: Payer: Self-pay | Admitting: *Deleted

## 2011-09-28 MED ORDER — GABAPENTIN 600 MG PO TABS: 600.0000 mg | ORAL_TABLET | Freq: Three times a day (TID) | ORAL | Status: DC

## 2011-10-05 ENCOUNTER — Other Ambulatory Visit: Payer: Self-pay | Admitting: Internal Medicine

## 2011-10-20 ENCOUNTER — Other Ambulatory Visit: Payer: Self-pay | Admitting: Internal Medicine

## 2011-11-20 ENCOUNTER — Other Ambulatory Visit: Payer: Self-pay | Admitting: Internal Medicine

## 2011-11-26 ENCOUNTER — Other Ambulatory Visit: Payer: Self-pay | Admitting: Internal Medicine

## 2011-11-27 ENCOUNTER — Other Ambulatory Visit: Payer: Self-pay | Admitting: Internal Medicine

## 2011-11-29 ENCOUNTER — Other Ambulatory Visit: Payer: Self-pay | Admitting: Internal Medicine

## 2011-12-07 ENCOUNTER — Encounter: Payer: Medicare Other | Admitting: Internal Medicine

## 2012-01-11 ENCOUNTER — Ambulatory Visit (INDEPENDENT_AMBULATORY_CARE_PROVIDER_SITE_OTHER): Payer: Medicare Other | Admitting: Internal Medicine

## 2012-01-11 ENCOUNTER — Encounter: Payer: Self-pay | Admitting: Internal Medicine

## 2012-01-11 VITALS — BP 130/79 | HR 101 | Temp 97.2°F | Ht 63.0 in | Wt 216.9 lb

## 2012-01-11 DIAGNOSIS — I10 Essential (primary) hypertension: Secondary | ICD-10-CM

## 2012-01-11 DIAGNOSIS — M549 Dorsalgia, unspecified: Secondary | ICD-10-CM

## 2012-01-11 DIAGNOSIS — F341 Dysthymic disorder: Secondary | ICD-10-CM

## 2012-01-11 MED ORDER — AMITRIPTYLINE HCL 50 MG PO TABS: 50.0000 mg | ORAL_TABLET | Freq: Every day | ORAL | Status: DC

## 2012-01-11 MED ORDER — GABAPENTIN 600 MG PO TABS: 600.0000 mg | ORAL_TABLET | Freq: Three times a day (TID) | ORAL | Status: DC

## 2012-01-11 MED ORDER — OMEPRAZOLE 20 MG PO CPDR: 20.0000 mg | DELAYED_RELEASE_CAPSULE | Freq: Every day | ORAL | Status: DC

## 2012-01-11 MED ORDER — CYCLOBENZAPRINE HCL 5 MG PO TABS: 5.0000 mg | ORAL_TABLET | Freq: Every evening | ORAL | Status: DC | PRN

## 2012-01-11 NOTE — Assessment & Plan Note (Signed)
Managing well with amytripyline qhs.

## 2012-01-11 NOTE — Assessment & Plan Note (Addendum)
Stable on Flexeril and Gabapentin.  States Celebrex irritated her stomach. To see Dr. Channing Mutters in September 2013.  Interested in acupuncture but limited by monetary issues.

## 2012-01-11 NOTE — Patient Instructions (Addendum)
It was good to see you again. I am sorry to hear about all of the passings among your family and friends. Continue to keep your appointment with Dr. Channing Mutters in September. I would like to see you back in 1 year.  Call the clinic if you need to be seen sooner.

## 2012-01-11 NOTE — Assessment & Plan Note (Addendum)
Well controlled today on hctz 25 mg qd. Bp 130/79 today

## 2012-01-11 NOTE — Progress Notes (Signed)
  Subjective:    Patient ID: Misty Decker, female    DOB: August 26, 1955, 56 y.o.   MRN: [id number]  HPI  Presents to clinic for f/u hypertension, depression and back pain. Reports Medicaid will run out this month.  Would like to try acupuncture for back pain. States that the OTC CoQ10 helped and is also using a mixture of cinnamon and honey that improves her overall well being and helps with weight management. Also using aloe vera on her hands to soften. Neighbor, dad and 2 others died recently which has depressed her mood a bit but she feels that she is managing fine without need for counseling or increased medication.  Review of Systems  Constitutional: Negative for fever and unexpected weight change.  HENT: Negative.   Gastrointestinal: Negative.        States that the cinnamon and honey mixture helps keep "smooth" bowel movements  Musculoskeletal: Positive for back pain.  Neurological: Negative for numbness and headaches.  Psychiatric/Behavioral: Positive for dysphoric mood. Negative for confusion and agitation.       Objective:   Physical Exam  Constitutional: She is oriented to person, place, and time. She appears well-developed and well-nourished. No distress.  HENT:  Head: Normocephalic and atraumatic.  Eyes: Conjunctivae and EOM are normal. Pupils are equal, round, and reactive to light.       +eye glasses  Neck: Neck supple.  Cardiovascular: Normal rate, regular rhythm, normal heart sounds and intact distal pulses.   Pulmonary/Chest: Effort normal and breath sounds normal.  Abdominal: Soft. Bowel sounds are normal.  Musculoskeletal: Normal range of motion. She exhibits no edema.  Neurological: She is alert and oriented to person, place, and time.  Skin: Skin is warm and dry.  Psychiatric: She has a normal mood and affect. Her behavior is normal. Judgment and thought content normal.       Seems to be adjusting adequately given the recent deaths          Assessment &  Plan:

## 2012-01-25 ENCOUNTER — Other Ambulatory Visit: Payer: Self-pay | Admitting: Internal Medicine

## 2012-03-22 ENCOUNTER — Other Ambulatory Visit: Payer: Self-pay | Admitting: Internal Medicine

## 2012-03-22 ENCOUNTER — Ambulatory Visit (INDEPENDENT_AMBULATORY_CARE_PROVIDER_SITE_OTHER): Payer: Medicare Other | Admitting: *Deleted

## 2012-03-22 DIAGNOSIS — Z23 Encounter for immunization: Secondary | ICD-10-CM

## 2012-04-05 ENCOUNTER — Other Ambulatory Visit: Payer: Self-pay | Admitting: Internal Medicine

## 2012-04-25 ENCOUNTER — Encounter: Payer: Self-pay | Admitting: Internal Medicine

## 2012-04-25 ENCOUNTER — Ambulatory Visit (INDEPENDENT_AMBULATORY_CARE_PROVIDER_SITE_OTHER): Payer: Medicare Other | Admitting: Internal Medicine

## 2012-04-25 VITALS — BP 132/84 | HR 85 | Temp 98.6°F | Ht 63.0 in | Wt 217.3 lb

## 2012-04-25 DIAGNOSIS — I10 Essential (primary) hypertension: Secondary | ICD-10-CM

## 2012-04-25 DIAGNOSIS — R21 Rash and other nonspecific skin eruption: Secondary | ICD-10-CM

## 2012-04-25 DIAGNOSIS — R51 Headache: Secondary | ICD-10-CM

## 2012-04-25 DIAGNOSIS — M79601 Pain in right arm: Secondary | ICD-10-CM

## 2012-04-25 DIAGNOSIS — M79609 Pain in unspecified limb: Secondary | ICD-10-CM

## 2012-04-25 DIAGNOSIS — Z0000 Encounter for general adult medical examination without abnormal findings: Secondary | ICD-10-CM

## 2012-04-25 DIAGNOSIS — G4486 Cervicogenic headache: Secondary | ICD-10-CM | POA: Insufficient documentation

## 2012-04-25 MED ORDER — HYDROCORTISONE 1 % EX CREA: TOPICAL_CREAM | CUTANEOUS | Status: DC

## 2012-04-25 MED ORDER — DIPHENHYDRAMINE HCL 25 MG PO CAPS: 25.0000 mg | ORAL_CAPSULE | ORAL | Status: DC | PRN

## 2012-04-25 NOTE — Patient Instructions (Addendum)
Please schedule a follow up appointment in 2 months with your PCP or sooner if needed . Please bring your medication bottles with your next appointment. Please take your medicines as prescribed. Call the clinic if your rash gets worse or becomes more painful.

## 2012-04-25 NOTE — Assessment & Plan Note (Signed)
Lab Results  Component Value Date   NA 141 08/11/2011   K 4.1 08/11/2011   CL 101 08/11/2011   CO2 28 08/11/2011   BUN 16 08/11/2011   CREATININE 0.89 08/11/2011   CREATININE 0.76 02/08/2010    BP Readings from Last 3 Encounters:  04/25/12 132/84  01/11/12 130/79  08/11/11 138/84    Assessment: Hypertension control:  controlled  Progress toward goals:  at goal Barriers to meeting goals:  no barriers identified  Plan: Hypertension treatment:  continue current medications

## 2012-04-25 NOTE — Assessment & Plan Note (Signed)
Patient presents with rash on the anterior aspect of her right side of the neck for last 2-3 days associated with itching and burning. On asking in retrospect she did say that she was doing some yard work over the weekend. On exam her rash is maculopapular in appearance. Differentials for her rash include contact dermatitis versus insect bite. The appearance and distribution is not consistent with shingles at this time. -Hydrocortisone cream and Benadryl for itching.

## 2012-04-25 NOTE — Assessment & Plan Note (Signed)
Reports having headaches which she describes as starting in the neck and radiates all the way up to the head associated with some funny sensations. Etiology is not clear but differentials include occipital neuralgia versus headaches related to cervical spine disease versus tension headaches. I think her description is likely consistent with occipital neuralgias. She is already on gabapentin. She has an upcoming appointment coming with her neurosurgeon.She may discuss these headaches with him. -Continue to monitor.

## 2012-04-25 NOTE — Progress Notes (Signed)
Subjective:   Patient ID: Misty Decker female   DOB: 1955-12-13 56 y.o.   MRN: [id number]  HPI: 56 year old woman with past medical history significant for hypertension, hyperlipidemia, fibromyalgia presents to the clinic for a followup visit  Rash: Reports having new onset rash on the right side of her neck starting the Saturday and progressively getting worse over this period of time. She states that she is allergic to poison ivy but didn't think that she had any contact with it. On asking in retrospect she did admit doing some yard work over the weekend. Denies using any new soaps, detergents or lotions. Her rash is associated with itching and burning.  Fell in spring- tripped over a stone and fell on her right arm and wrist. Since then she is having right hand pain, arm pain especially at night. Describes as throbbing pain, rates her pain 7-8/10 when it is worse.  Also reports having headache- describes as pressure like , located all over the head( sometimes worse on right side). Starts from neck and radiates up -feels like when you hit the funny bone. Also reports feeling lightheaded at times.On 11/19- she has appt with her neurosurgeon. Past Medical History  Diagnosis Date  . Sciatica   . Depression   . Fibromyalgia   . Encounter for preventive health examination     Next mammogram in 07/2009. Next colonoscopy in 09/2017. DEXA 4/09>Normal  . Encounter for screening colonoscopy 10/03/2007    Normal  . Hyperlipidemia   . Hypertension   . Degenerative joint disease     s/p cervical and lumbar fusions   Family History  Problem Relation Age of Onset  . Cancer Mother   . Diabetes Mother    History   Social History  . Marital Status: Married    Spouse Name: N/A    Number of Children: N/A  . Years of Education: N/A   Occupational History  . Not on file.   Social History Main Topics  . Smoking status: Former Smoker    Types: Cigarettes    Quit date: 04/03/2003  . Smokeless  tobacco: Not on file  . Alcohol Use: Yes     Comment: special occasions  . Drug Use: No  . Sexually Active: Not on file   Other Topics Concern  . Not on file   Social History Narrative  . No narrative on file   Review of Systems: General: Denies fever, chills, diaphoresis, appetite change and fatigue. HEENT: Denies photophobia, eye pain, redness, hearing loss, ear pain, congestion, sore throat, rhinorrhea, sneezing, mouth sores, trouble swallowing, neck pain, neck stiffness and tinnitus. Respiratory: Denies SOB, DOE, cough, chest tightness, and wheezing. Cardiovascular: Denies to chest pain, palpitations and leg swelling. Gastrointestinal: Denies nausea, vomiting, abdominal pain, diarrhea, constipation, blood in stool and abdominal distention. Genitourinary: Denies dysuria, urgency, frequency, hematuria, flank pain and difficulty urinating. Musculoskeletal: Denies myalgias, back pain, joint swelling, arthralgias and gait problem.  Skin: Denies pallor, + rash and wound. Neurological: Denies dizziness, seizures, syncope, weakness, light-headedness, numbness and headaches. Hematological: Denies adenopathy, easy bruising, personal or family bleeding history. Psychiatric/Behavioral: Denies suicidal ideation, mood changes, confusion, nervousness, sleep disturbance and agitation.     Current Outpatient Medications: Current Outpatient Prescriptions  Medication Sig Dispense Refill  . amitriptyline (ELAVIL) 50 MG tablet Take 1 tablet (50 mg total) by mouth at bedtime.  30 tablet  3  . calcium-vitamin D (OSCAL) 250-125 MG-UNIT per tablet Take 1 tablet by mouth 3 (three) times daily.        Marland Kitchen  celecoxib (CELEBREX) 100 MG capsule Take 1 capsule (100 mg total) by mouth daily.  30 capsule  0  . Coenzyme Q10 (COQ10) 150 MG CAPS Take 1 capsule by mouth daily.        . cyclobenzaprine (FLEXERIL) 5 MG tablet Take 1 tablet (5 mg total) by mouth at bedtime as needed for muscle spasms.  30 tablet  1  .  cyclobenzaprine (FLEXERIL) 5 MG tablet TAKE 1 TABLET (5 MG TOTAL) BY MOUTH AT BEDTIME AS NEEDED FOR MUSCLE SPASMS.  30 tablet  1  . gabapentin (NEURONTIN) 600 MG tablet TAKE 1 TABLET (600 MG TOTAL) BY MOUTH 3 (THREE) TIMES DAILY.  90 tablet  1  . hydrochlorothiazide (HYDRODIURIL) 25 MG tablet TAKE 1 TABLET BY MOUTH DAILY.  90 tablet  3  . lovastatin (MEVACOR) 20 MG tablet TAKE 1 TABLET BY MOUTH ONCE DAILY  31 tablet  11  . Multiple Vitamin (MULTIVITAMIN) capsule Take 1 capsule by mouth daily.        . Omega-3 Fatty Acids (FISH OIL) 300 MG CAPS Take 1 capsule by mouth daily.        Marland Kitchen omeprazole (PRILOSEC) 20 MG capsule Take 1 capsule (20 mg total) by mouth daily.  60 capsule  3    Allergies: Allergies  Allergen Reactions  . Codeine   . Latex     REACTION: rash  . Morphine   . Penicillins   . Tramadol       Objective:   Physical Exam: There were no vitals filed for this visit.  General: Vital signs reviewed and noted. Well-developed, well-nourished, in no acute distress; alert, appropriate and cooperative throughout examination. Head: Normocephalic, atraumatic Lungs: Normal respiratory effort. Clear to auscultation BL without crackles or wheezes. Heart: RRR. S1 and S2 normal without gallop, murmur, or rubs. Abdomen:BS normoactive. Soft, Nondistended, non-tender.  No masses or organomegaly. Extremities: No pretibial edema. Skin: scattered maculopapular rash on the right side of the neck, distributed in 5x 7 cm area    Assessment & Plan:

## 2012-04-25 NOTE — Assessment & Plan Note (Signed)
She reports having return of her right arm and hand pain ( were the symptoms before her cervical fusion surgery) when she fell back in the spring season. She states she has tried heat or ice and physical therapy but nothing is really helping her although she did say that she had difficulty holding objects and moving the arm more in the past but it has improved slightly. She is not willing to to enroll in physical therapy because of financial issues. -Continue to monitor for now with current medicines. -Continue heat or ice.

## 2012-05-12 ENCOUNTER — Other Ambulatory Visit: Payer: Self-pay | Admitting: Internal Medicine

## 2012-06-10 ENCOUNTER — Other Ambulatory Visit: Payer: Self-pay | Admitting: Internal Medicine

## 2012-07-14 ENCOUNTER — Other Ambulatory Visit: Payer: Self-pay | Admitting: Internal Medicine

## 2012-07-14 DIAGNOSIS — Z1231 Encounter for screening mammogram for malignant neoplasm of breast: Secondary | ICD-10-CM

## 2012-08-05 ENCOUNTER — Emergency Department (HOSPITAL_COMMUNITY)
Admission: EM | Admit: 2012-08-05 | Discharge: 2012-08-05 | Disposition: A | Discharge status: Home / Self Care | Payer: Medicare Other | Attending: Emergency Medicine | Admitting: Emergency Medicine

## 2012-08-05 ENCOUNTER — Emergency Department (HOSPITAL_COMMUNITY): Payer: Medicare Other

## 2012-08-05 ENCOUNTER — Encounter (HOSPITAL_COMMUNITY): Payer: Self-pay

## 2012-08-05 DIAGNOSIS — R42 Dizziness and giddiness: Secondary | ICD-10-CM

## 2012-08-05 DIAGNOSIS — Z87891 Personal history of nicotine dependence: Secondary | ICD-10-CM | POA: Insufficient documentation

## 2012-08-05 DIAGNOSIS — F3289 Other specified depressive episodes: Secondary | ICD-10-CM | POA: Insufficient documentation

## 2012-08-05 DIAGNOSIS — E785 Hyperlipidemia, unspecified: Secondary | ICD-10-CM | POA: Insufficient documentation

## 2012-08-05 DIAGNOSIS — Z79899 Other long term (current) drug therapy: Secondary | ICD-10-CM | POA: Insufficient documentation

## 2012-08-05 DIAGNOSIS — I10 Essential (primary) hypertension: Secondary | ICD-10-CM | POA: Insufficient documentation

## 2012-08-05 DIAGNOSIS — F329 Major depressive disorder, single episode, unspecified: Secondary | ICD-10-CM | POA: Insufficient documentation

## 2012-08-05 DIAGNOSIS — M5412 Radiculopathy, cervical region: Secondary | ICD-10-CM

## 2012-08-05 DIAGNOSIS — Z8739 Personal history of other diseases of the musculoskeletal system and connective tissue: Secondary | ICD-10-CM | POA: Insufficient documentation

## 2012-08-05 DIAGNOSIS — R51 Headache: Secondary | ICD-10-CM | POA: Insufficient documentation

## 2012-08-05 MED ORDER — OXYCODONE-ACETAMINOPHEN 5-325 MG PO TABS
2.0000 | ORAL_TABLET | Freq: Once | ORAL | Status: AC
  Administered 2012-08-05: 2 via ORAL
  Filled 2012-08-05: qty 2

## 2012-08-05 MED ORDER — OXYCODONE-ACETAMINOPHEN 5-325 MG PO TABS: 2.0000 | ORAL_TABLET | ORAL | Status: DC | PRN

## 2012-08-05 NOTE — ED Notes (Signed)
Patient transported to CT 

## 2012-08-05 NOTE — ED Notes (Signed)
Time advised

## 2012-08-05 NOTE — ED Provider Notes (Signed)
History     CSN: [id number]  Arrival date & time 08/05/12  1515   First MD Initiated Contact with Patient 08/05/12 2107      Chief Complaint  Patient presents with  . Neck Pain  . Dizziness    (Consider location/radiation/quality/duration/timing/severity/associated sxs/prior treatment) HPI Comments: Patient presents here with complaints of neck pain and headache.  She says that when she turns her head she becomes dizzy and her hands become numb.  She has a history of cervical fusions in the past.  She says that this has been worse since she fell two weeks ago when the snow came through.  No bowel or bladder complaints.  No lower extremity involvement.  Symptoms are worse with turning head and relieved with lying still.    The history is provided by the patient.    Past Medical History  Diagnosis Date  . Sciatica   . Depression   . Fibromyalgia   . Encounter for preventive health examination     Next mammogram in 07/2009. Next colonoscopy in 09/2017. DEXA 4/09>Normal  . Encounter for screening colonoscopy 10/03/2007    Normal  . Hyperlipidemia   . Hypertension   . Degenerative joint disease     s/p cervical and lumbar fusions    Past Surgical History  Procedure Laterality Date  . Total abdominal hysterectomy    . Cervical fusion      X 3  . Lumbar fusion      X 2  . Tonsillectomy    . Rectal prolapse repair      Family History  Problem Relation Age of Onset  . Cancer Mother   . Diabetes Mother     History  Substance Use Topics  . Smoking status: Former Smoker    Types: Cigarettes    Quit date: 04/03/2003  . Smokeless tobacco: Not on file  . Alcohol Use: Yes     Comment: special occasions    OB History   Grav Para Term Preterm Abortions TAB SAB Ect Mult Living                  Review of Systems  All other systems reviewed and are negative.    Allergies  Codeine; Latex; Morphine; Penicillins; and Tramadol  Home Medications   Current Outpatient  Rx  Name  Route  Sig  Dispense  Refill  . amitriptyline (ELAVIL) 50 MG tablet   Oral   Take 50 mg by mouth at bedtime.         . calcium-vitamin D (OSCAL) 250-125 MG-UNIT per tablet   Oral   Take 1 tablet by mouth 3 (three) times daily.           . cyclobenzaprine (FLEXERIL) 5 MG tablet   Oral   Take 1 tablet (5 mg total) by mouth at bedtime as needed for muscle spasms.   30 tablet   1   . gabapentin (NEURONTIN) 600 MG tablet   Oral   Take 600 mg by mouth 3 (three) times daily.         . hydrochlorothiazide (HYDRODIURIL) 25 MG tablet   Oral   Take 25 mg by mouth daily.         Marland Kitchen lovastatin (MEVACOR) 20 MG tablet   Oral   Take 20 mg by mouth at bedtime.         . Multiple Vitamin (MULTIVITAMIN) capsule   Oral   Take 1 capsule by mouth daily.           Marland Kitchen  Omega-3 Fatty Acids (FISH OIL) 300 MG CAPS   Oral   Take 1 capsule by mouth daily.           Marland Kitchen omeprazole (PRILOSEC) 20 MG capsule   Oral   Take 1 capsule (20 mg total) by mouth daily.   60 capsule   3     BP 129/86  Pulse 78  Temp(Src) 98 F (36.7 C) (Oral)  Resp 18  SpO2 96%  Physical Exam  Nursing note and vitals reviewed. Constitutional: She is oriented to person, place, and time. She appears well-developed and well-nourished. No distress.  HENT:  Head: Normocephalic and atraumatic.  Mouth/Throat: Oropharynx is clear and moist.  Neck: Normal range of motion. Neck supple.  Cardiovascular: Normal rate and regular rhythm.   No murmur heard. Pulmonary/Chest: Effort normal and breath sounds normal. No respiratory distress.  Abdominal: Soft. Bowel sounds are normal. She exhibits no distension. There is no tenderness.  Musculoskeletal: Normal range of motion.  There is ttp in the upper cervical spine.  No stepoffs.  She moves all four extremities.  Strength is 5/5 in the bue and ble.    Neurological: She is alert and oriented to person, place, and time.  Skin: Skin is warm and dry. She is not  diaphoretic.    ED Course  Procedures (including critical care time)  Labs Reviewed - No data to display No results found.   No diagnosis found.    MDM  The patient presents here with numbness of the hands, neck pain, and dizziness for the past two weeks.  She has had neck surgery in the past with Dr. Channing Mutters.  She is to see him next week to discuss.         Geoffery Lyons, MD 08/05/12 [phone]

## 2012-08-05 NOTE — ED Notes (Signed)
Pt c/o posterior neck pain and dizziness x2 months, increase dizziness when she turns her head.

## 2012-08-08 ENCOUNTER — Ambulatory Visit (HOSPITAL_COMMUNITY)
Admission: RE | Admit: 2012-08-08 | Discharge: 2012-08-08 | Disposition: A | Discharge status: Home / Self Care | Payer: Medicare Other | Source: Ambulatory Visit | Attending: Internal Medicine | Admitting: Internal Medicine

## 2012-08-08 DIAGNOSIS — Z1231 Encounter for screening mammogram for malignant neoplasm of breast: Secondary | ICD-10-CM | POA: Insufficient documentation

## 2012-08-10 ENCOUNTER — Encounter: Payer: Self-pay | Admitting: Internal Medicine

## 2012-08-10 ENCOUNTER — Ambulatory Visit (INDEPENDENT_AMBULATORY_CARE_PROVIDER_SITE_OTHER): Payer: Medicare Other | Admitting: Internal Medicine

## 2012-08-10 VITALS — BP 110/85 | HR 85 | Temp 99.0°F | Ht 63.0 in | Wt 213.3 lb

## 2012-08-10 DIAGNOSIS — R51 Headache: Secondary | ICD-10-CM

## 2012-08-10 DIAGNOSIS — G4486 Cervicogenic headache: Secondary | ICD-10-CM

## 2012-08-10 NOTE — Assessment & Plan Note (Addendum)
With her history of cervical spondylosis and cervical radiculopathy with associated radiographic findings, I think this is almost certainly cervicogenic headaches. One can certainly have blurred vision and dizziness/imbalance with these types of headaches. Her symptoms are not consistent with tension type, migrainous, or cluster-type headaches. Her symptoms are not consistent with vertigo. It would be nice if we could treat her with NSAIDs, but she says she is intolerant to these with a history of gastritis and ulcers. Other treatments to try include physical therapy, glucocorticoid injections to the cervical spine, and surgery. She is scheduled to see her surgeon, Dr. Trey Sailors, next Tuesday. Treatment will be deferred to him.

## 2012-08-10 NOTE — Patient Instructions (Signed)
Keep your appointment with her spine doctor.  Give [place] a call if symptoms worsen.  SEEK IMMEDIATE MEDICAL CARE IF:  You develop a high fever, chills, or repeated vomiting.  You faint or have difficulty with vision.  You develop unusual numbness or weakness of your arms or legs.  Relief of pain is inadequate with medication, or you develop severe pain.  You develop confusion, or neck stiffness.  You have a worsening of a headache or do not obtain relief.
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
## 2012-08-11 ENCOUNTER — Other Ambulatory Visit: Payer: Self-pay | Admitting: Internal Medicine

## 2012-09-14 ENCOUNTER — Encounter: Payer: Self-pay | Admitting: Internal Medicine

## 2012-09-14 DIAGNOSIS — R269 Unspecified abnormalities of gait and mobility: Secondary | ICD-10-CM | POA: Insufficient documentation

## 2012-09-16 ENCOUNTER — Other Ambulatory Visit: Payer: Self-pay | Admitting: Internal Medicine

## 2012-10-12 ENCOUNTER — Other Ambulatory Visit: Payer: Self-pay | Admitting: Internal Medicine

## 2012-12-08 ENCOUNTER — Other Ambulatory Visit: Payer: Self-pay | Admitting: Internal Medicine

## 2012-12-11 ENCOUNTER — Other Ambulatory Visit: Payer: Self-pay | Admitting: Internal Medicine

## 2012-12-20 ENCOUNTER — Other Ambulatory Visit: Payer: Self-pay | Admitting: Internal Medicine

## 2013-01-07 ENCOUNTER — Other Ambulatory Visit: Payer: Self-pay | Admitting: Internal Medicine

## 2013-02-05 ENCOUNTER — Other Ambulatory Visit: Payer: Self-pay | Admitting: Internal Medicine

## 2013-02-09 ENCOUNTER — Other Ambulatory Visit: Payer: Self-pay | Admitting: Internal Medicine

## 2013-02-17 NOTE — Telephone Encounter (Signed)
Pls make appt with Dr Bosie Clos Vanessa Kick thr March

## 2013-02-17 NOTE — Telephone Encounter (Signed)
Request if from 9/4, can you please refill for pt?

## 2013-02-20 ENCOUNTER — Encounter: Payer: Self-pay | Admitting: Internal Medicine

## 2013-03-07 ENCOUNTER — Other Ambulatory Visit: Payer: Self-pay | Admitting: Internal Medicine

## 2013-03-20 ENCOUNTER — Ambulatory Visit (INDEPENDENT_AMBULATORY_CARE_PROVIDER_SITE_OTHER): Payer: Medicare Other | Admitting: *Deleted

## 2013-03-20 DIAGNOSIS — Z23 Encounter for immunization: Secondary | ICD-10-CM

## 2013-03-22 ENCOUNTER — Other Ambulatory Visit: Payer: Self-pay | Admitting: Internal Medicine

## 2013-05-08 ENCOUNTER — Encounter: Payer: Self-pay | Admitting: Internal Medicine

## 2013-05-08 ENCOUNTER — Ambulatory Visit (INDEPENDENT_AMBULATORY_CARE_PROVIDER_SITE_OTHER): Payer: Medicare Other | Admitting: Internal Medicine

## 2013-05-08 VITALS — BP 138/81 | HR 98 | Temp 97.5°F | Ht 62.5 in | Wt 215.2 lb

## 2013-05-08 DIAGNOSIS — IMO0001 Reserved for inherently not codable concepts without codable children: Secondary | ICD-10-CM

## 2013-05-08 DIAGNOSIS — I10 Essential (primary) hypertension: Secondary | ICD-10-CM

## 2013-05-08 DIAGNOSIS — K219 Gastro-esophageal reflux disease without esophagitis: Secondary | ICD-10-CM | POA: Insufficient documentation

## 2013-05-08 DIAGNOSIS — E785 Hyperlipidemia, unspecified: Secondary | ICD-10-CM

## 2013-05-08 DIAGNOSIS — R51 Headache: Secondary | ICD-10-CM

## 2013-05-08 DIAGNOSIS — M549 Dorsalgia, unspecified: Secondary | ICD-10-CM

## 2013-05-08 DIAGNOSIS — Z9889 Other specified postprocedural states: Secondary | ICD-10-CM

## 2013-05-08 DIAGNOSIS — G4486 Cervicogenic headache: Secondary | ICD-10-CM

## 2013-05-08 DIAGNOSIS — G4700 Insomnia, unspecified: Secondary | ICD-10-CM

## 2013-05-08 MED ORDER — AMITRIPTYLINE HCL 50 MG PO TABS: ORAL_TABLET | ORAL | Status: DC

## 2013-05-08 MED ORDER — CYCLOBENZAPRINE HCL 5 MG PO TABS: ORAL_TABLET | ORAL | Status: DC

## 2013-05-08 MED ORDER — GABAPENTIN 600 MG PO TABS: ORAL_TABLET | ORAL | Status: DC

## 2013-05-08 MED ORDER — OMEPRAZOLE 20 MG PO CPDR: DELAYED_RELEASE_CAPSULE | ORAL | Status: DC

## 2013-05-08 NOTE — Progress Notes (Signed)
   Subjective:    Patient ID: Misty Decker, female    DOB: 06-27-1955, 57 y.o.   MRN: [id number]  HPI  [name] past medical history is significant for lumbar and cervical vertebral fusion (2004 & 2006), cervicogenic headaches, anxiety, depression, and hypertension. She reports that she had recent repeat cervical neck surgery Aug/Sept 2014 by Dr. Channing Mutters.  Pt expressing many family social stressors mainly centering on the distributing of her father's estate after his death and subsequent taxes.  Otherwise no acute medical complaints but states that she is still having neck pressure and cervicogenic headaches. She has concerns that the physical therapist, neurosurgeon, and orthopaedist do not agree on a plan and what is causing her ailments. She is requesting a referral to Dr. Nickola Major of [facility] as she has worked with her many years ago when Dr. Nickola Major was associated with Dr. Temple Pacini practice.  Review of Systems  Constitutional: Negative.   Eyes: Negative.   Respiratory: Negative.   Cardiovascular: Negative.   Gastrointestinal: Negative.   Endocrine: Negative.   Genitourinary: Negative.   Musculoskeletal: Negative.   Skin: Negative.   Allergic/Immunologic: Negative.   Neurological: Positive for dizziness and headaches. Negative for weakness, light-headedness and numbness.  Hematological: Negative.   Psychiatric/Behavioral: Negative.        Objective:   Physical Exam  Constitutional: She is oriented to person, place, and time. She appears well-developed and well-nourished. No distress.  HENT:  Head: Normocephalic and atraumatic.  Right Ear: External ear normal.  Left Ear: External ear normal.  Eyes: Conjunctivae and EOM are normal. Pupils are equal, round, and reactive to light.  Neck: Neck supple. Decreased range of motion present. No thyromegaly present.  Cardiovascular: Normal rate and regular rhythm.   Pulmonary/Chest: Effort normal and breath  sounds normal.  Abdominal: Soft. Bowel sounds are normal.  Musculoskeletal: She exhibits no edema and no tenderness.  Neurological: She is alert and oriented to person, place, and time. She has normal reflexes.  Skin: Skin is warm.          Assessment & Plan:  See separate problem list charting:  # chronic neck pain s/p spinal fusion: intolerant to several analgesics including morphine, codeine, and meloxicam. Currently manged with Percocet per Dr. Channing Mutters.  -Referal to  Dr. Nickola Major:  # hypertension: at goal on HCT -check BMET at next visit  # hyperlipidemia: on pravastain -check lipid panel at next visit

## 2013-05-08 NOTE — Patient Instructions (Signed)
Since there will be no more surgeries, we have referred your to Physical Medicine and Rehabiliation Dr. Nickola Major. We have refilled your medications today.

## 2013-05-11 NOTE — Progress Notes (Signed)
Case discussed with Dr. Schooler soon after the resident saw the patient.  We reviewed the resident's history and exam and pertinent patient test results.  I agree with the assessment, diagnosis, and plan of care documented in the resident's note. 

## 2013-05-11 NOTE — Assessment & Plan Note (Signed)
s/p spinal fusion: intolerant to several analgesics including morphine, codeine, and meloxicam. Currently manged with Percocet per Dr. Roy.  -Referal to  Dr. Dalton-Bethea:   

## 2013-05-11 NOTE — Assessment & Plan Note (Signed)
s/p spinal fusion: intolerant to several analgesics including morphine, codeine, and meloxicam. Currently manged with Percocet per Dr. Channing Mutters.  -Referal to  Dr. Nickola Major:

## 2013-05-11 NOTE — Assessment & Plan Note (Signed)
on pravastain -check lipid panel at next visit and cmet

## 2013-05-11 NOTE — Assessment & Plan Note (Signed)
Stable, States that meloxicam caused her to have worsened insomnia

## 2013-05-11 NOTE — Assessment & Plan Note (Signed)
BP Readings from Last 3 Encounters:  05/08/13 138/81  08/10/12 110/85  08/05/12 95/67    Lab Results  Component Value Date   NA 141 08/11/2011   K 4.1 08/11/2011   CREATININE 0.89 08/11/2011    Assessment: Blood pressure control: controlled Progress toward BP goal:  at goal Comments:   Plan: Medications:  continue current medications Educational resources provided: brochure;handout;video Self management tools provided:   Other plans: check bmet next visit given thiazide tx

## 2013-05-16 ENCOUNTER — Other Ambulatory Visit: Payer: Self-pay | Admitting: Internal Medicine

## 2013-05-29 ENCOUNTER — Other Ambulatory Visit: Payer: Self-pay | Admitting: Internal Medicine

## 2013-06-05 ENCOUNTER — Other Ambulatory Visit: Payer: Self-pay | Admitting: Internal Medicine

## 2013-06-05 DIAGNOSIS — M549 Dorsalgia, unspecified: Secondary | ICD-10-CM

## 2013-06-05 DIAGNOSIS — G4486 Cervicogenic headache: Secondary | ICD-10-CM

## 2013-07-05 ENCOUNTER — Other Ambulatory Visit: Payer: Self-pay | Admitting: Internal Medicine

## 2013-07-05 DIAGNOSIS — Z1231 Encounter for screening mammogram for malignant neoplasm of breast: Secondary | ICD-10-CM

## 2013-07-10 ENCOUNTER — Other Ambulatory Visit: Payer: Self-pay | Admitting: Internal Medicine

## 2013-07-10 ENCOUNTER — Ambulatory Visit
Admission: RE | Admit: 2013-07-10 | Discharge: 2013-07-10 | Disposition: A | Discharge status: Home / Self Care | Payer: Medicare Other | Source: Ambulatory Visit | Attending: Neurosurgery | Admitting: Neurosurgery

## 2013-07-10 ENCOUNTER — Other Ambulatory Visit: Payer: Self-pay | Admitting: Neurosurgery

## 2013-07-10 DIAGNOSIS — M47812 Spondylosis without myelopathy or radiculopathy, cervical region: Secondary | ICD-10-CM

## 2013-08-11 ENCOUNTER — Ambulatory Visit (HOSPITAL_COMMUNITY)
Admission: RE | Admit: 2013-08-11 | Discharge: 2013-08-11 | Disposition: A | Discharge status: Home / Self Care | Payer: Medicare Other | Source: Ambulatory Visit | Attending: Internal Medicine | Admitting: Internal Medicine

## 2013-08-11 DIAGNOSIS — Z1231 Encounter for screening mammogram for malignant neoplasm of breast: Secondary | ICD-10-CM | POA: Insufficient documentation

## 2013-09-03 ENCOUNTER — Other Ambulatory Visit: Payer: Self-pay | Admitting: Internal Medicine

## 2013-10-31 ENCOUNTER — Other Ambulatory Visit: Payer: Self-pay | Admitting: Internal Medicine

## 2013-11-21 ENCOUNTER — Ambulatory Visit (INDEPENDENT_AMBULATORY_CARE_PROVIDER_SITE_OTHER): Payer: Medicare Other | Admitting: Internal Medicine

## 2013-11-21 VITALS — BP 122/78 | HR 85 | Temp 97.5°F | Wt 229.8 lb

## 2013-11-21 DIAGNOSIS — L02419 Cutaneous abscess of limb, unspecified: Secondary | ICD-10-CM

## 2013-11-21 DIAGNOSIS — L03119 Cellulitis of unspecified part of limb: Secondary | ICD-10-CM

## 2013-11-21 DIAGNOSIS — L03116 Cellulitis of left lower limb: Secondary | ICD-10-CM

## 2013-11-21 MED ORDER — CLINDAMYCIN HCL 300 MG PO CAPS: 300.0000 mg | ORAL_CAPSULE | Freq: Three times a day (TID) | ORAL | Status: DC

## 2013-11-21 MED ORDER — DOXYCYCLINE HYCLATE 50 MG PO CAPS: 100.0000 mg | ORAL_CAPSULE | Freq: Two times a day (BID) | ORAL | Status: DC

## 2013-11-21 NOTE — Progress Notes (Signed)
Patient ID: Misty Decker, female   DOB: 1956-03-06, 58 y.o.   MRN: [id number]  Subjective:   Patient ID: Misty Decker female   DOB: 11/14/1955 58 y.o.   MRN: [id number]  HPI: [name] is a 58 y.o. F w/ PMH lumbar and cervical vertebral fusion (2004 & 2006), cervicogenic headaches, anxiety, depression, and HTN presents for an acute visit after a tick bite.   The patient states that she began with itching of her left inner thigh and noticed a tick on her leg 2 days ago. She states that it was about the size of her small finger nail. She removed the tick and states that she feels that it was completely removed. Since that time, she developed redness and itching at the site. She was afraid to take Benadryl b/c she was concerned that it may affect her blood pressure. She states that the redness has been increasing each day, and the site is now tender.    She denies fevers, myalgias, rash, or change in her chronic pain and headaches.   She did just shave her legs 3 days ago.     Past Medical History  Diagnosis Date  . Sciatica   . Depression   . Fibromyalgia   . Encounter for preventive health examination     Next mammogram in 07/2009. Next colonoscopy in 09/2017. DEXA 4/09>Normal  . Encounter for screening colonoscopy 10/03/2007    Normal  . Hyperlipidemia   . Hypertension   . Degenerative joint disease     s/p cervical and lumbar fusions   Current Outpatient Prescriptions  Medication Sig Dispense Refill  . amitriptyline (ELAVIL) 50 MG tablet TAKE 1 TABLET BY MOUTH EVERY DAY AT BEDTIME  30 tablet  1  . calcium-vitamin D (OSCAL) 250-125 MG-UNIT per tablet Take 1 tablet by mouth 3 (three) times daily.        . cyclobenzaprine (FLEXERIL) 5 MG tablet TAKE 1 TABLET (5 MG TOTAL) BY MOUTH AT BEDTIME AS NEEDED FOR MUSCLE SPASMS.  30 tablet  5  . gabapentin (NEURONTIN) 600 MG tablet Take 1 tablet (600 mg total) by mouth 3 (three) times daily.  90 tablet  11  . hydrochlorothiazide  (HYDRODIURIL) 25 MG tablet TAKE 1 TABLET BY MOUTH DAILY.  90 tablet  3  . lovastatin (MEVACOR) 20 MG tablet TAKE 1 TABLET BY MOUTH ONCE DAILY  31 tablet  11  . Multiple Vitamin (MULTIVITAMIN) capsule Take 1 capsule by mouth daily.        . Omega-3 Fatty Acids (FISH OIL) 300 MG CAPS Take 1 capsule by mouth daily.        Marland Kitchen omeprazole (PRILOSEC) 20 MG capsule TAKE 1 CAPSULE (20 MG TOTAL) BY MOUTH DAILY.  90 capsule  1  . oxyCODONE-acetaminophen (PERCOCET) 5-325 MG per tablet Take 2 tablets by mouth every 4 (four) hours as needed for pain.  20 tablet  0   No current facility-administered medications for this visit.   Family History  Problem Relation Age of Onset  . Cancer Mother   . Diabetes Mother    History   Social History  . Marital Status: Married    Spouse Name: N/A    Number of Children: N/A  . Years of Education: N/A   Social History Main Topics  . Smoking status: Former Smoker    Types: Cigarettes    Quit date: 04/03/2003  . Smokeless tobacco: Not on file  . Alcohol Use: Yes  Comment: special occasions  . Drug Use: No  . Sexual Activity: Not on file   Other Topics Concern  . Not on file   Social History Narrative  . No narrative on file   Review of Systems: Constitutional: Denies fever, chills, or fatigue.  Respiratory: Denies SOB, DOE Cardiovascular: Denies chest pain Gastrointestinal: Denies nausea, vomiting, abdominal pain, diarrhea, constipation Genitourinary: Denies dysuria,  Musculoskeletal: + chronic back pain, denies any change in her pain Skin: + redness and tenderness at her left medial thigh. Denies rash or redness on any other part of the body Neurological: + chronic dizziness and headache, denies any acute changes Psychiatric/Behavioral: Denies mood changes  Objective:  Physical Exam: Filed Vitals:   11/21/13 1035  BP: 126/81  Pulse: 85  Temp: 97 F (36.1 C)  TempSrc: Oral  Weight: 229 lb 12.8 oz (104.237 kg)  SpO2: 98%    Constitutional: Vital signs reviewed.  Patient is a well-developed and well-nourished female in no acute distress and cooperative with exam.   Head: Normocephalic and atraumatic Eyes: PERRL, EOMI, conjunctivae normal.  Cardiovascular: RRR, no MRG Pulmonary/Chest: normal respiratory effort, CTAB Abdominal: Soft. Non-tender, non-distended Musculoskeletal: No joint deformities or stiffness Neurological: A&O x3, cranial nerve II-XII are grossly intact, no focal motor deficit Skin: Left medial thigh with significant erythema, measuring 8x14cm with central excoriation with a small amount of serous drainage from the site and induration about 1-2cm in circumference around the excoriation. No fluctuance or petechiae, and non-papular. Psychiatric: Normal mood and affect.   Assessment & Plan:   Please refer to Problem List based Assessment and Plan

## 2013-11-21 NOTE — Addendum Note (Signed)
Addended by: Otho Bellows on: 11/21/2013 02:36 PM   Modules accepted: Orders

## 2013-11-21 NOTE — Assessment & Plan Note (Addendum)
Pt with what appears to be nonpurulent cellulitis of the left medial thigh. She does have recent tick exposure, which raises concerns for tick-related infection, including RMSF. However, she is not having any fevers, myalgias, or change in her chronic headaches or pain. Given that this does appear to be cellulitis, will treat as such. She does have a PCN allergy, but with her recent tick exposure, will treat with single agent of Doxycycline for 7 days, which will also cover for empirically RMFS, and have her return to the clinic later this week to see how she is doing.   She was advised to call the clinic or go to the ED if she developed a fever, a rash on her hands or other parts of her body, worsening of the redness on her thigh, or increased pain.   Addendum: Pt took the Doxycycline this morning after leaving the clinic and 15 minutes later became ill with nausea and vomiting. She has not had any SOB, swelling of the tongue or throat, or hives/rash occur. She arrived at the clinic today at 2pm and her vomiting has resolved. I suspect that this a reaction to the Doxycycline and have added it to her allergy list.  My suspicion for a tick-associated infection is low, so I am switching antibiotics to Clindamycin to treat for cellulitis, which will cover for MRSA. This was discussed with Dr. Lynnae January. - Stopping Doxycycline - Start Clindamycin 300mg  po TID x7 days

## 2013-11-21 NOTE — Patient Instructions (Addendum)
The redness and pain at your inner thigh seems to be an infection of the skin.   **STOP taking Doxycycline and begin taking the Clindamycin 3 times a day for the next 7 days. Please return to the clinic on Thursday (we are closed this Friday), so we can see how your infection is doing.   **If you begin to develop a fever, a rash on other parts of your body, worsening of the redness on your thigh, or increased pain, please call the clinic or go to the Emergency Room immediately.   General Instructions:   Please bring your medicines with you each time you come to clinic.  Medicines may include prescription medications, over-the-counter medications, herbal remedies, eye drops, vitamins, or other pills.   Progress Toward Treatment Goals:  Treatment Goal 05/08/2013  Blood pressure at goal    Self Care Goals & Plans:  Self Care Goal 05/08/2013  Manage my medications take my medicines as prescribed; bring my medications to every visit; refill my medications on time; follow the sick day instructions if I am sick  Monitor my health -  Eat healthy foods eat more vegetables; eat fruit for snacks and desserts; eat baked foods instead of fried foods; eat foods that are low in salt; eat smaller portions  Be physically active find an activity I enjoy    No flowsheet data found.   Care Management & Community Referrals:  No flowsheet data found.     Cellulitis Cellulitis is an infection of the skin and the tissue beneath it. The infected area is usually red and tender. Cellulitis occurs most often in the arms and lower legs.  CAUSES  Cellulitis is caused by bacteria that enter the skin through cracks or cuts in the skin. The most common types of bacteria that cause cellulitis are Staphylococcus and Streptococcus. SYMPTOMS   Redness and warmth.  Swelling.  Tenderness or pain.  Fever. DIAGNOSIS  Your caregiver can usually determine what is wrong based on a physical exam. Blood tests may  also be done. TREATMENT  Treatment usually involves taking an antibiotic medicine. HOME CARE INSTRUCTIONS   Take your antibiotics as directed. Finish them even if you start to feel better.  Keep the infected arm or leg elevated to reduce swelling.  Apply a warm cloth to the affected area up to 4 times per day to relieve pain.  Only take over-the-counter or prescription medicines for pain, discomfort, or fever as directed by your caregiver.  Keep all follow-up appointments as directed by your caregiver. SEEK MEDICAL CARE IF:   You notice red streaks coming from the infected area.  Your red area gets larger or turns dark in color.  Your bone or joint underneath the infected area becomes painful after the skin has healed.  Your infection returns in the same area or another area.  You notice a swollen bump in the infected area.  You develop new symptoms. SEEK IMMEDIATE MEDICAL CARE IF:   You have a fever.  You feel very sleepy.  You develop vomiting or diarrhea.  You have a general ill feeling (malaise) with muscle aches and pains. MAKE SURE YOU:   Understand these instructions.  Will watch your condition.  Will get help right away if you are not doing well or get worse. Document Released: 03/04/2005 Document Revised: 11/24/2011 Document Reviewed: 08/10/2011 [facility] Patient Information 2014 [place].

## 2013-11-22 NOTE — Progress Notes (Signed)
I saw and evaluated the patient.  I personally confirmed the key portions of the history and exam documented by Dr. Glenn and I reviewed pertinent patient test results.  The assessment, diagnosis, and plan were formulated together and I agree with the documentation in the resident's note. 

## 2013-11-23 ENCOUNTER — Ambulatory Visit (INDEPENDENT_AMBULATORY_CARE_PROVIDER_SITE_OTHER): Payer: Medicare Other | Admitting: Internal Medicine

## 2013-11-23 ENCOUNTER — Encounter: Payer: Self-pay | Admitting: Internal Medicine

## 2013-11-23 VITALS — BP 127/80 | HR 88 | Temp 97.0°F | Ht 63.0 in | Wt 228.6 lb

## 2013-11-23 DIAGNOSIS — L03116 Cellulitis of left lower limb: Secondary | ICD-10-CM

## 2013-11-23 DIAGNOSIS — L02419 Cutaneous abscess of limb, unspecified: Secondary | ICD-10-CM

## 2013-11-23 DIAGNOSIS — L03119 Cellulitis of unspecified part of limb: Secondary | ICD-10-CM

## 2013-11-23 NOTE — Assessment & Plan Note (Signed)
The patient's cellulitis is improving. She is to continue the clindamycin to complete a 7 day course. She was asked to return to the clinic if the redness worsens, or if she begins to experience fevers, chills or worsening of her chronic pain or change in her chronic headaches.

## 2013-11-23 NOTE — Progress Notes (Signed)
Patient ID: Misty Decker, female   DOB: 06/23/1955, 58 y.o.   MRN: [id number]  Subjective:   Patient ID: Misty Decker female   DOB: 19-Oct-1955 58 y.o.   MRN: [id number]  HPI: [name] is a 58 y.o. F w/ PMH lumbar and cervical vertebral fusion (2004 & 2006), cervicogenic headaches, anxiety, depression, and HTN presents for a f/u visit after being treated for cellulitis associated with a tick bite.   The pt has been taking her Clindamycin as prescribed and is on day 3/7. She denies any further nausea/vomiting after stopping the Doxycycline. The redness has improved on her left posteromedial thigh, but it is still itchy.   She denies any fevers or chills, or change in her chronic pain or chronic headaches.   She has been feeling more fatigued with the antibiotics, but overall is doing better.    Past Medical History  Diagnosis Date  . Sciatica   . Depression   . Fibromyalgia   . Encounter for preventive health examination     Next mammogram in 07/2009. Next colonoscopy in 09/2017. DEXA 4/09>Normal  . Encounter for screening colonoscopy 10/03/2007    Normal  . Hyperlipidemia   . Hypertension   . Degenerative joint disease     s/p cervical and lumbar fusions   Current Outpatient Prescriptions  Medication Sig Dispense Refill  . amitriptyline (ELAVIL) 50 MG tablet TAKE 1 TABLET BY MOUTH EVERY DAY AT BEDTIME  30 tablet  1  . calcium-vitamin D (OSCAL) 250-125 MG-UNIT per tablet Take 1 tablet by mouth 3 (three) times daily.        . clindamycin (CLEOCIN) 300 MG capsule Take 1 capsule (300 mg total) by mouth 3 (three) times daily.  21 capsule  0  . cyclobenzaprine (FLEXERIL) 5 MG tablet TAKE 1 TABLET (5 MG TOTAL) BY MOUTH AT BEDTIME AS NEEDED FOR MUSCLE SPASMS.  30 tablet  5  . gabapentin (NEURONTIN) 600 MG tablet Take 1 tablet (600 mg total) by mouth 3 (three) times daily.  90 tablet  11  . hydrochlorothiazide (HYDRODIURIL) 25 MG tablet TAKE 1 TABLET BY MOUTH DAILY.  90 tablet   3  . lovastatin (MEVACOR) 20 MG tablet TAKE 1 TABLET BY MOUTH ONCE DAILY  31 tablet  11  . Multiple Vitamin (MULTIVITAMIN) capsule Take 1 capsule by mouth daily.        . Omega-3 Fatty Acids (FISH OIL) 300 MG CAPS Take 1 capsule by mouth daily.        Marland Kitchen omeprazole (PRILOSEC) 20 MG capsule TAKE 1 CAPSULE (20 MG TOTAL) BY MOUTH DAILY.  90 capsule  1   No current facility-administered medications for this visit.   Family History  Problem Relation Age of Onset  . Cancer Mother   . Diabetes Mother    History   Social History  . Marital Status: Married    Spouse Name: N/A    Number of Children: N/A  . Years of Education: N/A   Social History Main Topics  . Smoking status: Former Smoker    Types: Cigarettes    Quit date: 04/03/2003  . Smokeless tobacco: None  . Alcohol Use: Yes     Comment: special occasions  . Drug Use: No  . Sexual Activity: None   Other Topics Concern  . None   Social History Narrative  . None   Review of Systems: A 12 point ROS was performed; pertinent positives and negatives were noted in the HPI  Objective:  Physical Exam: Filed Vitals:   11/23/13 1322  BP: 127/80  Pulse: 88  Temp: 97 F (36.1 C)  TempSrc: Oral  Height: 5\' 3"  (1.6 m)  Weight: 228 lb 9.6 oz (103.692 kg)  SpO2: 99%   Constitutional: Vital signs reviewed. Patient is a well-developed and well-nourished female in no acute distress and cooperative with exam.  Head: Normocephalic and atraumatic  Eyes: PERRL, EOMI, conjunctivae normal.  Cardiovascular: RRR, no MRG  Pulmonary/Chest: normal respiratory effort, CTAB  Abdominal: Non-distended  Musculoskeletal: No joint deformities or stiffness Neurological: A&O x3, no focal neurological deficits  Skin: Left medial thigh with improved erythema with central excoriation now w/o drainage from the site and improved induration about 1/2cm in circumference around the excoriation. The area is w/o fluctuance or petechiae, and non-papular. No  rash or petechia on extremities.  Psychiatric: Normal mood and affect.   Assessment & Plan:   Please refer to Problem List based Assessment and Plan

## 2013-11-23 NOTE — Patient Instructions (Signed)
Continue to take the Clindamycin as prescribed.   **If you begin to develop a fever, a rash on other parts of your body, worsening of the redness on your thigh, or increased pain, please call the clinic or go to the Emergency Room immediately.

## 2013-11-24 NOTE — Progress Notes (Signed)
Attending physician note: Presenting complaints, physical findings, medications, medical history, reviewed with resident physician Dr. Lennart Pall on the day of the patient's visit and I concur with the management. Murriel Hopper, M.D., [place]

## 2013-11-30 ENCOUNTER — Other Ambulatory Visit: Payer: Self-pay | Admitting: Internal Medicine

## 2013-12-26 ENCOUNTER — Other Ambulatory Visit: Payer: Self-pay | Admitting: *Deleted

## 2013-12-26 MED ORDER — AMITRIPTYLINE HCL 50 MG PO TABS: 50.0000 mg | ORAL_TABLET | Freq: Every day | ORAL | Status: DC

## 2014-01-08 ENCOUNTER — Other Ambulatory Visit: Payer: Self-pay | Admitting: *Deleted

## 2014-01-08 MED ORDER — HYDROCHLOROTHIAZIDE 25 MG PO TABS: 25.0000 mg | ORAL_TABLET | Freq: Every day | ORAL | Status: DC

## 2014-01-30 ENCOUNTER — Other Ambulatory Visit: Payer: Self-pay | Admitting: *Deleted

## 2014-01-30 DIAGNOSIS — K219 Gastro-esophageal reflux disease without esophagitis: Secondary | ICD-10-CM

## 2014-01-30 MED ORDER — OMEPRAZOLE 20 MG PO CPDR
20.0000 mg | DELAYED_RELEASE_CAPSULE | Freq: Every day | ORAL | Status: DC
Start: 2014-01-30 — End: 2014-11-20

## 2014-02-02 ENCOUNTER — Other Ambulatory Visit: Payer: Self-pay | Admitting: *Deleted

## 2014-02-02 DIAGNOSIS — E785 Hyperlipidemia, unspecified: Secondary | ICD-10-CM

## 2014-02-02 MED ORDER — LOVASTATIN 20 MG PO TABS: 20.0000 mg | ORAL_TABLET | Freq: Every day | ORAL | Status: DC

## 2014-02-02 NOTE — Telephone Encounter (Addendum)
Please schedule an appointment with patient's PCP.  Also, please have patient come in for a lipid panel and CMP.

## 2014-02-02 NOTE — Telephone Encounter (Signed)
Please send in a 3 month supply

## 2014-02-02 NOTE — Telephone Encounter (Signed)
Pt scheduled with PCP and for labs.

## 2014-02-06 ENCOUNTER — Other Ambulatory Visit (INDEPENDENT_AMBULATORY_CARE_PROVIDER_SITE_OTHER): Payer: Medicare Other

## 2014-02-06 DIAGNOSIS — E785 Hyperlipidemia, unspecified: Secondary | ICD-10-CM

## 2014-02-06 LAB — COMPLETE METABOLIC PANEL WITH GFR
ALK PHOS: 106 U/L (ref 39–117)
ALT: 107 U/L — ABNORMAL HIGH (ref 0–35)
AST: 103 U/L — ABNORMAL HIGH (ref 0–37)
Albumin: 4.2 g/dL (ref 3.5–5.2)
BUN: 12 mg/dL (ref 6–23)
CALCIUM: 9.5 mg/dL (ref 8.4–10.5)
CHLORIDE: 102 meq/L (ref 96–112)
CO2: 25 mEq/L (ref 19–32)
CREATININE: 0.8 mg/dL (ref 0.50–1.10)
GFR, Est African American: >89 mL/min
GFR, Est Non African American: 82 mL/min
Glucose, Bld: 107 mg/dL — ABNORMAL HIGH (ref 70–99)
Potassium: 3.9 mEq/L (ref 3.5–5.3)
Sodium: 138 mEq/L (ref 135–145)
Total Bilirubin: 0.6 mg/dL (ref 0.2–1.2)
Total Protein: 7 g/dL (ref 6.0–8.3)

## 2014-02-06 LAB — LIPID PANEL
CHOL/HDL RATIO: 4.1 ratio
Cholesterol: 146 mg/dL (ref 0–200)
HDL: 36 mg/dL — ABNORMAL LOW (ref >39)
LDL CALC: 54 mg/dL (ref 0–99)
Triglycerides: 280 mg/dL — ABNORMAL HIGH (ref <150)
VLDL: 56 mg/dL — ABNORMAL HIGH (ref 0–40)

## 2014-02-27 ENCOUNTER — Encounter: Payer: Self-pay | Admitting: Internal Medicine

## 2014-02-27 DIAGNOSIS — K760 Fatty (change of) liver, not elsewhere classified: Secondary | ICD-10-CM | POA: Insufficient documentation

## 2014-03-06 ENCOUNTER — Ambulatory Visit: Payer: Medicare Other | Admitting: Internal Medicine

## 2014-03-09 ENCOUNTER — Other Ambulatory Visit: Payer: Self-pay | Admitting: *Deleted

## 2014-03-09 DIAGNOSIS — G4486 Cervicogenic headache: Secondary | ICD-10-CM

## 2014-03-09 DIAGNOSIS — R51 Headache: Principal | ICD-10-CM

## 2014-03-09 MED ORDER — CYCLOBENZAPRINE HCL 5 MG PO TABS: ORAL_TABLET | ORAL | Status: DC

## 2014-03-27 ENCOUNTER — Other Ambulatory Visit: Payer: Self-pay | Admitting: *Deleted

## 2014-03-27 MED ORDER — AMITRIPTYLINE HCL 50 MG PO TABS: 50.0000 mg | ORAL_TABLET | Freq: Every day | ORAL | Status: DC

## 2014-04-06 ENCOUNTER — Other Ambulatory Visit: Payer: Self-pay | Admitting: Internal Medicine

## 2014-04-13 ENCOUNTER — Telehealth: Payer: Self-pay | Admitting: Licensed Clinical Social Worker

## 2014-04-13 NOTE — Telephone Encounter (Signed)
Patient was getting her 3rd dose of vanc this morning and the RN called stating that the patient's hands starting itching, and a rash appeared on her back and tail bone. Would like to know the plan going forward. Please advise

## 2014-04-16 NOTE — Telephone Encounter (Signed)
Change over the daptomycin per our discussion. Orders signed and faxed

## 2014-04-23 ENCOUNTER — Ambulatory Visit: Payer: Medicare Other | Admitting: Internal Medicine

## 2014-04-26 ENCOUNTER — Ambulatory Visit (INDEPENDENT_AMBULATORY_CARE_PROVIDER_SITE_OTHER): Payer: Medicare Other | Admitting: Internal Medicine

## 2014-04-26 ENCOUNTER — Encounter: Payer: Self-pay | Admitting: Internal Medicine

## 2014-04-26 ENCOUNTER — Ambulatory Visit (INDEPENDENT_AMBULATORY_CARE_PROVIDER_SITE_OTHER): Payer: Medicare Other | Admitting: *Deleted

## 2014-04-26 VITALS — BP 136/79 | HR 84 | Temp 98.3°F | Ht 63.0 in | Wt 230.4 lb

## 2014-04-26 DIAGNOSIS — R945 Abnormal results of liver function studies: Secondary | ICD-10-CM

## 2014-04-26 DIAGNOSIS — Z23 Encounter for immunization: Secondary | ICD-10-CM

## 2014-04-26 DIAGNOSIS — E785 Hyperlipidemia, unspecified: Secondary | ICD-10-CM

## 2014-04-26 DIAGNOSIS — F418 Other specified anxiety disorders: Secondary | ICD-10-CM

## 2014-04-26 DIAGNOSIS — I10 Essential (primary) hypertension: Secondary | ICD-10-CM | POA: Diagnosis not present

## 2014-04-26 DIAGNOSIS — R7989 Other specified abnormal findings of blood chemistry: Secondary | ICD-10-CM | POA: Diagnosis not present

## 2014-04-26 DIAGNOSIS — F341 Dysthymic disorder: Secondary | ICD-10-CM

## 2014-04-26 DIAGNOSIS — F411 Generalized anxiety disorder: Secondary | ICD-10-CM

## 2014-04-26 DIAGNOSIS — L989 Disorder of the skin and subcutaneous tissue, unspecified: Secondary | ICD-10-CM

## 2014-04-26 DIAGNOSIS — Z0000 Encounter for general adult medical examination without abnormal findings: Secondary | ICD-10-CM

## 2014-04-26 LAB — HEPATIC FUNCTION PANEL
ALT: 125 U/L — ABNORMAL HIGH (ref 0–35)
AST: 132 U/L — AB (ref 0–37)
Albumin: 4.4 g/dL (ref 3.5–5.2)
Alkaline Phosphatase: 119 U/L — ABNORMAL HIGH (ref 39–117)
BILIRUBIN TOTAL: 0.6 mg/dL (ref 0.2–1.2)
Bilirubin, Direct: 0.1 mg/dL (ref 0.0–0.3)
Indirect Bilirubin: 0.5 mg/dL (ref 0.2–1.2)
Total Protein: 7.3 g/dL (ref 6.0–8.3)

## 2014-04-26 NOTE — Assessment & Plan Note (Addendum)
Patient with elevated liver enzymes in September 2015: AST 103, ALT 107, total bilirubin 0.6, alkaline phosphatase 106. Mildly elevated liver enzymes and a hepatocellular pattern. Patient denies any significant use of alcohol or any family history of liver disease. Patient is on lovastatin, and the computer system states that she was started on this in January 2012. Her last normal liver function tests were in 2011, which brings up the possibility of a statin-related elevation in liver enzymes. However, upon further questioning, patient states that she has been on lovastatin for much longer than this. Assuming that this is true, patient had normal LFTs status post significant course of lovastatin in 2011, which makes the possibility of lovastatin as the offending agents lower.   - Repeat LFTs today  - Consider further workup including hepatitis panel and liver ultrasound further down the road should the liver enzymes remain elevated.  Addendum 04/27/14: Persistently elevated LFTs.   - hepatitis panel (hepatitis B core and surface Ab, hep B surface Ag, hep C Ab)  - Fe and TIBC to rule out hemachromatosis  - RUQ ultrasound to evaluate for fatty liver disease  Addendum 04/30/2014: Hepatitis panel negative (hepatitis B core and surface antibody, hepatitis B surface antigen, hepatitis C antibody) ruling out hepatitis as an etiology for the elevated liver enzymes. However, this indicates that the patient has not been immunized to hepatitis B.  - If the ultrasound indicates nonalcoholic fatty liver disease, would suggest weight loss as well as vaccination for hepatitis A and B, Haemophilus influenza type B, pneumococcal (PPSV23), herpes zoster, MMR.

## 2014-04-26 NOTE — Progress Notes (Signed)
Internal Medicine Clinic Attending  Questionable melanoma like lesion on breast - urgent referral to derm.  I saw and evaluated the patient.  I personally confirmed the key portions of the history and exam documented by Dr. Raelene Bott and I reviewed pertinent patient test results.  The assessment, diagnosis, and plan were formulated together and I agree with the documentation in the resident's note.

## 2014-04-26 NOTE — Assessment & Plan Note (Addendum)
Patient reports noticing a lesion on her right breast. It is 1 cm in diameter, has a regular border, is asymmetric, and has multiple tinges of red. Patient denies any history of melanoma. This is concerning for melanoma. Patient also notes some persistent induration and pruritus around the site of her tick bite along the medial aspect of her left knee from June 2015. She states that the surrounding cellulitis had significantly resolved status post antibiotic treatment during that time. She states that the area is no longer as erythematous or tender as before. Patient denies any fevers, chills or progression of the lesion.  - Referral for dermatology for further evaluation.

## 2014-04-26 NOTE — Assessment & Plan Note (Addendum)
Patient states that she has been dealing with a lot of stressors, including all problems for her and her husband. She states that she has also been struggling with some cultural differences with her husband's family. She reports that the amitriptyline has been helping her adequately.  - Continue amitriptyline 50 mg daily at bedtime

## 2014-04-26 NOTE — Assessment & Plan Note (Signed)
BP Readings from Last 3 Encounters:  04/26/14 136/79  11/23/13 127/80  11/21/13 122/78    Lab Results  Component Value Date   NA 138 02/06/2014   K 3.9 02/06/2014   CREATININE 0.80 02/06/2014    Assessment: Blood pressure control:   Progress toward BP goal:    Comments: Patient has historically had good blood pressure control on hydrochlorothiazide 25 mg.  Plan: Medications:  continue current medications: Continue hydrochlorothiazide 25 mg daily

## 2014-04-26 NOTE — Patient Instructions (Signed)
We have given you a flu shot today. We have given you a referral for dermatology for your skin lesion. We will draw some blood tests today and I will update you on the results.   General Instructions:   Thank you for bringing your medicines today. This helps [place] keep you safe from mistakes.   Progress Toward Treatment Goals:  Treatment Goal 05/08/2013  Blood pressure at goal    Self Care Goals & Plans:  Self Care Goal 04/26/2014  Manage my medications take my medicines as prescribed; bring my medications to every visit; refill my medications on time  Monitor my health -  Eat healthy foods drink diet soda or water instead of juice or soda; eat more vegetables; eat foods that are low in salt; eat baked foods instead of fried foods; eat fruit for snacks and desserts  Be physically active -    No flowsheet data found.   Care Management & Community Referrals:  No flowsheet data found.

## 2014-04-26 NOTE — Assessment & Plan Note (Signed)
Lipid Panel     Component Value Date/Time   CHOL 146 02/06/2014 0932   TRIG 280* 02/06/2014 0932   HDL 36* 02/06/2014 0932   CHOLHDL 4.1 02/06/2014 0932   VLDL 56* 02/06/2014 0932   LDLCALC 54 02/06/2014 0932    Patient currently on lovastatin 20 mg. Patient states that she has been on this therapy for longer than an our computerized records may indicate (January 2012 at the earliest). There was some initial concern that her lovastatin may be contributing to her elevated LFTs, which was found September 2015. Patient's last normal LFTs were 2011, which the initiation of her lovastatin does predate. Patient with a persistent hypertriglyceridemia of 280 from September 2015.   - Will hold on further initiation of fibroids for hypertriglyceridemia at this point, given patient's low risk profile.  - Continue on lovastatin 20 mg.

## 2014-04-26 NOTE — Progress Notes (Signed)
   Subjective:    Patient ID: Misty Decker, female    DOB: 1956-04-19, 58 y.o.   MRN: [id number]  HPI  Patient is a 58 year old woman with a history of anxiety, depression, chronic back pain, fibromyalgia, GERD, hypertriglyceridemia, hypertension, and recently found to have elevated LFTs who presents to clinic for follow-up.   Please refer to separate problem-list charting for more details.  Review of Systems  Constitutional: Negative for fever and chills.  HENT: Negative for rhinorrhea and sore throat.   Eyes: Negative for visual disturbance.  Respiratory: Negative for cough and shortness of breath.   Cardiovascular: Negative for chest pain and palpitations.  Gastrointestinal: Negative for nausea, vomiting, abdominal pain, diarrhea, constipation and blood in stool.  Genitourinary: Negative for dysuria and hematuria.  Neurological: Negative for syncope.      Objective:   Physical Exam  Constitutional: She is oriented to person, place, and time. She appears well-developed and well-nourished. No distress.  HENT:  Head: Normocephalic and atraumatic.  Eyes: EOM are normal. Pupils are equal, round, and reactive to light. Left eye exhibits no discharge.  Neck: Normal range of motion. Neck supple. No thyromegaly present.  Cardiovascular: Normal rate and regular rhythm.  Exam reveals no gallop and no friction rub.   No murmur heard. Pulmonary/Chest: Effort normal and breath sounds normal. No respiratory distress. She has no wheezes. She has no rales.  Abdominal: Soft. Bowel sounds are normal. She exhibits no distension. There is no tenderness. There is no rebound.  Musculoskeletal: She exhibits no edema.  Neurological: She is alert and oriented to person, place, and time. No cranial nerve deficit.  Skin: Skin is warm and dry.  Skin lesion on right breast 1 cm in diameter, asymmetric, irregular border, red with darker tinge in central aspect.   Psychiatric: She has a normal mood and  affect. Thought content normal.      Assessment & Plan:  Please refer to separate problem-list charting for more details.

## 2014-04-26 NOTE — Assessment & Plan Note (Addendum)
Patient asking for a flu vaccine today. Also inquiring about a pneumonia vaccine.  - Flu vaccine today  - Patient counseled on the fact that she is below age 58 and does not have any risk factors that would warrant a pneumonia vaccine before that age at this point. Patient has no history of diabetes, immunosuppression, or other factors associated with early vaccination. She is agreeable to this plan.  - Patient is up-to-date on mammograms and colonoscopies.

## 2014-04-27 ENCOUNTER — Other Ambulatory Visit: Payer: Self-pay | Admitting: Internal Medicine

## 2014-04-27 ENCOUNTER — Telehealth: Payer: Self-pay | Admitting: *Deleted

## 2014-04-27 DIAGNOSIS — R945 Abnormal results of liver function studies: Secondary | ICD-10-CM

## 2014-04-27 DIAGNOSIS — R7989 Other specified abnormal findings of blood chemistry: Secondary | ICD-10-CM

## 2014-04-27 LAB — IRON AND TIBC
%SAT: 25 % (ref 20–55)
IRON: 82 ug/dL (ref 42–145)
TIBC: 329 ug/dL (ref 250–470)
UIBC: 247 ug/dL (ref 125–400)

## 2014-04-27 LAB — HEPATITIS B SURFACE ANTIBODY,QUALITATIVE: HEP B S AB: NEGATIVE

## 2014-04-27 LAB — HEPATITIS B CORE ANTIBODY, TOTAL: Hep B Core Total Ab: NONREACTIVE

## 2014-04-27 LAB — HEPATITIS B SURFACE ANTIGEN: HEP B S AG: NEGATIVE

## 2014-04-27 LAB — HEPATITIS C ANTIBODY: HCV Ab: NEGATIVE

## 2014-04-27 NOTE — Telephone Encounter (Signed)
Call to pt to given appointment for [place] on 05/07/2014 at 8:30 AM to arrive by 8:15 AM.  Pt to be NPO 6 hours prior to test.  Patient voiced understanding of plan and will come to the Clinics after [place] for lab work per DR. Ngo.  Sander Nephew, RN 04/27/2014 8:59 AM.

## 2014-04-27 NOTE — Addendum Note (Signed)
Addended by: Truddie Crumble on: 04/27/2014 11:23 AM   Modules accepted: Orders

## 2014-04-27 NOTE — Addendum Note (Signed)
Addended by: Truddie Crumble on: 04/27/2014 11:45 AM   Modules accepted: Orders

## 2014-04-30 ENCOUNTER — Telehealth: Payer: Self-pay | Admitting: Internal Medicine

## 2014-04-30 NOTE — Telephone Encounter (Signed)
Patient notified of negative hepatitis panel. Patient due for liver ultrasound in a week.

## 2014-05-07 ENCOUNTER — Ambulatory Visit: Payer: Medicare Other

## 2014-05-07 ENCOUNTER — Ambulatory Visit (HOSPITAL_COMMUNITY)
Admission: RE | Admit: 2014-05-07 | Discharge: 2014-05-07 | Disposition: A | Discharge status: Home / Self Care | Payer: Medicare Other | Source: Ambulatory Visit | Attending: Internal Medicine | Admitting: Internal Medicine

## 2014-05-07 ENCOUNTER — Other Ambulatory Visit: Payer: Self-pay | Admitting: Internal Medicine

## 2014-05-07 ENCOUNTER — Telehealth: Payer: Self-pay | Admitting: Internal Medicine

## 2014-05-07 DIAGNOSIS — R945 Abnormal results of liver function studies: Secondary | ICD-10-CM

## 2014-05-07 DIAGNOSIS — K760 Fatty (change of) liver, not elsewhere classified: Secondary | ICD-10-CM

## 2014-05-07 NOTE — Assessment & Plan Note (Signed)
Liver ultrasound remarkable for fatty infiltrate. Patient denies any history of alcohol use, CONSISTENT with nonalcoholic fatty liver disease.  - Encouraged diet and exercise  - Encourage continued avoidance of alcohol.  - Consider hepatitis a and B vaccinations at next visit.

## 2014-05-07 NOTE — Telephone Encounter (Signed)
Patient was notified of her ultrasound showing evidence consistent with fatty infiltration, consistent with nonalcoholic fatty liver disease. Patient was counseled on the importance of diet and exercise and losing weight to help address this problem. Patient voiced understanding.

## 2014-05-22 ENCOUNTER — Other Ambulatory Visit: Payer: Self-pay | Admitting: Internal Medicine

## 2014-05-23 ENCOUNTER — Other Ambulatory Visit: Payer: Self-pay | Admitting: Internal Medicine

## 2014-06-21 ENCOUNTER — Other Ambulatory Visit: Payer: Self-pay | Admitting: Internal Medicine

## 2014-07-02 ENCOUNTER — Telehealth: Payer: Self-pay | Admitting: *Deleted

## 2014-07-02 NOTE — Telephone Encounter (Signed)
Received message from pt that she will no longer be using [facility] [address]due to changes in her insurance coverage, she will now be using [facility] on [address]  Aletha Halim update pt's chart, phone call complete.Regenia Skeeter, [place] Hotz Cassady1/25/201612:00 PM

## 2014-07-13 ENCOUNTER — Other Ambulatory Visit: Payer: Self-pay | Admitting: Internal Medicine

## 2014-07-13 DIAGNOSIS — Z1231 Encounter for screening mammogram for malignant neoplasm of breast: Secondary | ICD-10-CM

## 2014-08-14 ENCOUNTER — Other Ambulatory Visit: Payer: Self-pay | Admitting: *Deleted

## 2014-08-14 ENCOUNTER — Ambulatory Visit (HOSPITAL_COMMUNITY)
Admission: RE | Admit: 2014-08-14 | Discharge: 2014-08-14 | Disposition: A | Discharge status: Home / Self Care | Payer: Medicare HMO | Source: Ambulatory Visit | Attending: Internal Medicine | Admitting: Internal Medicine

## 2014-08-14 DIAGNOSIS — Z1231 Encounter for screening mammogram for malignant neoplasm of breast: Secondary | ICD-10-CM

## 2014-08-15 ENCOUNTER — Other Ambulatory Visit: Payer: Self-pay | Admitting: Internal Medicine

## 2014-08-15 MED ORDER — GABAPENTIN 600 MG PO TABS: 600.0000 mg | ORAL_TABLET | Freq: Three times a day (TID) | ORAL | Status: DC

## 2014-08-17 ENCOUNTER — Other Ambulatory Visit: Payer: Self-pay | Admitting: Internal Medicine

## 2014-08-17 NOTE — Telephone Encounter (Signed)
Refill sent 08/15/14.

## 2014-10-04 ENCOUNTER — Ambulatory Visit (INDEPENDENT_AMBULATORY_CARE_PROVIDER_SITE_OTHER): Payer: Medicare HMO | Admitting: Internal Medicine

## 2014-10-04 ENCOUNTER — Encounter: Payer: Self-pay | Admitting: Internal Medicine

## 2014-10-04 VITALS — BP 132/70 | HR 85 | Temp 98.2°F | Ht 63.0 in | Wt 227.3 lb

## 2014-10-04 DIAGNOSIS — K760 Fatty (change of) liver, not elsewhere classified: Secondary | ICD-10-CM

## 2014-10-04 DIAGNOSIS — T7840XA Allergy, unspecified, initial encounter: Secondary | ICD-10-CM

## 2014-10-04 DIAGNOSIS — I10 Essential (primary) hypertension: Secondary | ICD-10-CM

## 2014-10-04 DIAGNOSIS — L910 Hypertrophic scar: Secondary | ICD-10-CM | POA: Insufficient documentation

## 2014-10-04 DIAGNOSIS — R6889 Other general symptoms and signs: Secondary | ICD-10-CM

## 2014-10-04 DIAGNOSIS — Z23 Encounter for immunization: Secondary | ICD-10-CM | POA: Diagnosis not present

## 2014-10-04 DIAGNOSIS — E785 Hyperlipidemia, unspecified: Secondary | ICD-10-CM

## 2014-10-04 DIAGNOSIS — Z0000 Encounter for general adult medical examination without abnormal findings: Secondary | ICD-10-CM

## 2014-10-04 LAB — HEPATIC FUNCTION PANEL
ALT: 156 U/L — ABNORMAL HIGH (ref 0–35)
AST: 145 U/L — AB (ref 0–37)
Albumin: 4.3 g/dL (ref 3.5–5.2)
Alkaline Phosphatase: 113 U/L (ref 39–117)
BILIRUBIN INDIRECT: 0.5 mg/dL (ref 0.2–1.2)
Bilirubin, Direct: 0.2 mg/dL (ref 0.0–0.3)
Total Bilirubin: 0.7 mg/dL (ref 0.2–1.2)
Total Protein: 7.7 g/dL (ref 6.0–8.3)

## 2014-10-04 NOTE — Assessment & Plan Note (Addendum)
Patient states that she has been working hard on diet and exercise modifications. It seems that patient has been on lovastatin throughout her workup. -Discontinue lovastatin (especially given patient only has history of hypertriglyceridemia) -Repeat LFTs today. -Plan to repeat LFTs in 3 months to assess for improvement after diet and exercise modification as well as discontinuation of statin. -Vaccinated for hepatitis A and hepatitis B today. -Consider further vaccinations with PCV 23, HiB, MMR, herpes zoster in the future.

## 2014-10-04 NOTE — Assessment & Plan Note (Signed)
Interestingly, patient does note that she may have been bitten by a [place] tick. She has also noted some gastrointestinal symptoms including nausea and vomiting after eating beef and pork since this bite last year. This brings up the possibility for alpha galactosidase allergy which can happen after a [place] tick inoculates a human host with a mammalian carbohydrate, alpha galactosidase. This can lead to the development of a hypersensitivity to the carbohydrate which leads to an allergy to ingestion of mammalian tissue. Patient denies having any reactions to the chicken or fish. -Counseled patient to avoid mammalian meat products.

## 2014-10-04 NOTE — Progress Notes (Signed)
   Subjective:    Patient ID: Misty Decker, female    DOB: 1955/10/13, 59 y.o.   MRN: [id number]  HPI  Patient is a 59 year old female with a history of nonalcoholic fatty liver disease, depression, hypertension, GERD, hypertriglyceridemia who presents to clinic for a routine follow-up.  Please see problem based charting for more details.  Review of Systems  Constitutional: Negative for fever and chills.  HENT: Negative for rhinorrhea and sore throat.   Eyes: Negative for visual disturbance.  Respiratory: Negative for cough and shortness of breath.   Cardiovascular: Negative for chest pain and palpitations.  Gastrointestinal: Positive for vomiting ( After eating beef). Negative for nausea, abdominal pain, diarrhea, constipation and blood in stool.  Genitourinary: Negative for dysuria and hematuria.  Skin:       Scar on the posterior medial left knee after tick bite  Neurological: Negative for syncope.       Objective:   Physical Exam  Constitutional: She is oriented to person, place, and time. She appears well-developed and well-nourished. No distress.  HENT:  Head: Normocephalic and atraumatic.  Eyes: EOM are normal. Pupils are equal, round, and reactive to light.  Neck: Normal range of motion. Neck supple. No thyromegaly present.  Cardiovascular: Normal rate and regular rhythm.  Exam reveals no gallop and no friction rub.   No murmur heard. Pulmonary/Chest: Effort normal and breath sounds normal. No respiratory distress. She has no wheezes. She has no rales.  Abdominal: Soft. Bowel sounds are normal. She exhibits no distension. There is no tenderness. There is no rebound.  Musculoskeletal: She exhibits no edema.  Neurological: She is alert and oriented to person, place, and time. No cranial nerve deficit.  Skin: Skin is warm and dry. No rash noted.  Slightly hyperpigmented scar on the medial posterior knee with no surrounding erythema.  Psychiatric: She has a normal mood  and affect. Thought content normal.          Assessment & Plan:  Please see problem based charting for more details.

## 2014-10-04 NOTE — Assessment & Plan Note (Signed)
Hepatitis B and a vaccines administered today as patient has no memory or evidence of vaccination in the past (no hepatitis A on file, hepatitis B surface antibody negative). History of nonalcoholic fatty liver disease increases the importance of these vaccines.

## 2014-10-04 NOTE — Assessment & Plan Note (Signed)
BP Readings from Last 3 Encounters:  10/04/14 132/70  04/26/14 136/79  11/23/13 127/80    Lab Results  Component Value Date   NA 138 02/06/2014   K 3.9 02/06/2014   CREATININE 0.80 02/06/2014    Assessment: Blood pressure control: controlled Progress toward BP goal:  at goal Comments: Patient states that she is compliant with her hydrochlorothiazide 25 mg daily.  Plan: Medications:  continue current medications Educational resources provided: brochure (denies) Self management tools provided:   Other plans: none

## 2014-10-04 NOTE — Assessment & Plan Note (Signed)
Patient with a keloid on the posterior medial left knee after a tick bite last year. There is very low suspicion that there is any retained foreign body 1 year out from initial incident. Patient states that she remembered being a multiple take out completely during the initial incident. She states that it is occasionally pruritic. -Advised that the patient recontact her dermatologist for consideration for intralesional corticosteroid injection should her symptoms persist.

## 2014-10-04 NOTE — Patient Instructions (Addendum)
You have received the hepatitis vaccines today to help protect her liver. I will call you with updates on your lab results from clinic. I have discontinued your lovastatin since this may affect your liver. Please try to avoid pork and beef if you are having reactions to it. Please contact her dermatologist again if your scar on her leg continues to bother you as they can potentially give you an injection for it.

## 2014-10-04 NOTE — Assessment & Plan Note (Addendum)
Patient noted to have a isolated hypertriglyceridemia and has been on fish oil and lovastatin.  -Discontinue lovastatin, especially in the setting of elevated liver enzymes. -Recheck LFTs in 3 months to see if discontinuation of lovastatin may have affected her LFTs.

## 2014-10-05 NOTE — Progress Notes (Signed)
Internal Medicine Clinic Attending  Case discussed with Dr. Raelene Bott at the time of the visit.  We reviewed the resident's history and exam and pertinent patient test results.  I agree with the assessment, diagnosis, and plan of care documented in the resident's note.

## 2014-10-16 ENCOUNTER — Encounter: Payer: Self-pay | Admitting: *Deleted

## 2014-11-02 ENCOUNTER — Ambulatory Visit (INDEPENDENT_AMBULATORY_CARE_PROVIDER_SITE_OTHER): Payer: Medicare HMO | Admitting: *Deleted

## 2014-11-02 DIAGNOSIS — K760 Fatty (change of) liver, not elsewhere classified: Secondary | ICD-10-CM

## 2014-11-02 DIAGNOSIS — Z23 Encounter for immunization: Secondary | ICD-10-CM | POA: Diagnosis not present

## 2014-11-06 ENCOUNTER — Other Ambulatory Visit: Payer: Self-pay | Admitting: Internal Medicine

## 2014-11-20 ENCOUNTER — Other Ambulatory Visit: Payer: Self-pay | Admitting: *Deleted

## 2014-11-20 DIAGNOSIS — K219 Gastro-esophageal reflux disease without esophagitis: Secondary | ICD-10-CM

## 2014-11-21 MED ORDER — OMEPRAZOLE 20 MG PO CPDR: 20.0000 mg | DELAYED_RELEASE_CAPSULE | Freq: Every day | ORAL | Status: DC

## 2014-12-20 ENCOUNTER — Other Ambulatory Visit: Payer: Self-pay | Admitting: Internal Medicine

## 2014-12-20 NOTE — Telephone Encounter (Signed)
Aug or sept appt with PCP pls - F/U lipids

## 2014-12-20 NOTE — Telephone Encounter (Signed)
Message sent to front desk

## 2015-01-08 ENCOUNTER — Other Ambulatory Visit: Payer: Self-pay | Admitting: Internal Medicine

## 2015-01-10 ENCOUNTER — Encounter: Payer: Medicare HMO | Admitting: Internal Medicine

## 2015-01-16 ENCOUNTER — Encounter: Payer: Self-pay | Admitting: Internal Medicine

## 2015-01-16 ENCOUNTER — Ambulatory Visit (INDEPENDENT_AMBULATORY_CARE_PROVIDER_SITE_OTHER): Payer: Medicare HMO | Admitting: Internal Medicine

## 2015-01-16 VITALS — BP 123/92 | HR 80 | Temp 97.8°F | Ht 63.0 in | Wt 228.2 lb

## 2015-01-16 DIAGNOSIS — Z9229 Personal history of other drug therapy: Secondary | ICD-10-CM

## 2015-01-16 DIAGNOSIS — I10 Essential (primary) hypertension: Secondary | ICD-10-CM | POA: Diagnosis not present

## 2015-01-16 DIAGNOSIS — D582 Other hemoglobinopathies: Secondary | ICD-10-CM

## 2015-01-16 DIAGNOSIS — Z0000 Encounter for general adult medical examination without abnormal findings: Secondary | ICD-10-CM

## 2015-01-16 DIAGNOSIS — M542 Cervicalgia: Secondary | ICD-10-CM

## 2015-01-16 DIAGNOSIS — E785 Hyperlipidemia, unspecified: Secondary | ICD-10-CM

## 2015-01-16 DIAGNOSIS — R718 Other abnormality of red blood cells: Secondary | ICD-10-CM | POA: Diagnosis not present

## 2015-01-16 MED ORDER — CYCLOBENZAPRINE HCL 5 MG PO TABS: ORAL_TABLET | ORAL | Status: DC

## 2015-01-16 NOTE — Patient Instructions (Addendum)
Ms. Donovan it was nice meeting you. I have refilled your Flexeril prescription today. You may get drowsy or dizzy while taking this medication. Do not drive, use machinery, or do anything that may be dangerous until you know how the medicine affects you. Stand or sit up slowly. Please come back for a follow up visit in 11 weeks so that you can receive your final dose of Hepatitis B vaccination.

## 2015-01-17 DIAGNOSIS — D582 Other hemoglobinopathies: Secondary | ICD-10-CM | POA: Insufficient documentation

## 2015-01-17 DIAGNOSIS — M542 Cervicalgia: Secondary | ICD-10-CM | POA: Insufficient documentation

## 2015-01-17 LAB — BASIC METABOLIC PANEL
BUN/Creatinine Ratio: 17 (ref 9–23)
BUN: 14 mg/dL (ref 6–24)
CALCIUM: 9.5 mg/dL (ref 8.7–10.2)
CO2: 22 mmol/L (ref 18–29)
Chloride: 100 mmol/L (ref 97–108)
Creatinine, Ser: 0.83 mg/dL (ref 0.57–1.00)
GFR calc Af Amer: 90 mL/min/{1.73_m2} (ref >59)
GFR calc non Af Amer: 78 mL/min/{1.73_m2} (ref >59)
Glucose: 111 mg/dL — ABNORMAL HIGH (ref 65–99)
POTASSIUM: 4 mmol/L (ref 3.5–5.2)
Sodium: 142 mmol/L (ref 134–144)

## 2015-01-17 LAB — HEPATIC FUNCTION PANEL
ALK PHOS: 118 IU/L — AB (ref 39–117)
ALT: 176 IU/L — AB (ref 0–32)
AST: 178 IU/L — AB (ref 0–40)
Albumin: 4.6 g/dL (ref 3.5–5.5)
Bilirubin Total: 0.6 mg/dL (ref 0.0–1.2)
Bilirubin, Direct: 0.2 mg/dL (ref 0.00–0.40)
Total Protein: 7.2 g/dL (ref 6.0–8.5)

## 2015-01-17 LAB — CBC
HEMATOCRIT: 44.9 % (ref 34.0–46.6)
Hemoglobin: 15.4 g/dL (ref 11.1–15.9)
MCH: 32.6 pg (ref 26.6–33.0)
MCHC: 34.3 g/dL (ref 31.5–35.7)
MCV: 95 fL (ref 79–97)
PLATELETS: 209 10*3/uL (ref 150–379)
RBC: 4.72 x10E6/uL (ref 3.77–5.28)
RDW: 13.6 % (ref 12.3–15.4)
WBC: 5.6 10*3/uL (ref 3.4–10.8)

## 2015-01-17 NOTE — Assessment & Plan Note (Signed)
Patient is a former smoker and has a hx of H/H >15/45 since 2010. This is concerning for polycythemia.  -f/u CBC

## 2015-01-17 NOTE — Assessment & Plan Note (Signed)
3rd dose of Hep B vaccination due in end of October. -f.u appt booked around that time

## 2015-01-17 NOTE — Progress Notes (Signed)
Internal Medicine Clinic Attending  I saw and evaluated the patient.  I personally confirmed the key portions of the history and exam documented by Dr. Rathore and I reviewed pertinent patient test results.  The assessment, diagnosis, and plan were formulated together and I agree with the documentation in the resident's note.  

## 2015-01-17 NOTE — Assessment & Plan Note (Addendum)
Muscle spasms/ pressure-like pain in her neck that extends to the parietal region of her skull bilaterally. Hx of cervical and lumbar fusions and chronic pain. Pain is intermittent and worse with movement. No new focal neurological symptoms such as weakness, numbness, tingling, or paralysis. She is currently on Gabapentin 600 mg TID at home and continues to have neck pain. Muscle spasms most likely due to chronic cervical instability.  -Flexeril 5 mg 1 tab qhs prn muscle spasms. Patient states that she has taken this medication in the past and understands it makes a person drowsy. She agrees to take it at bedtime only.  -F/u visit in end of October with PCP Dr. Benjamine Mola

## 2015-01-17 NOTE — Assessment & Plan Note (Addendum)
BP stable, 123/92 today. -cont current mgmt with HCTZ 25 mg qd -F/u BMP

## 2015-01-17 NOTE — Assessment & Plan Note (Signed)
As per 10/04/14 note from Dr. Raelene Bott, patient has isolated hypertriglyceridemia and Lovastatin was discontinued in the setting of elevated LFTs. -LFTs repeated today, please f/u results

## 2015-01-17 NOTE — Progress Notes (Signed)
Patient ID: NALAH MACIOCE, female   DOB: 05-28-1956, 59 y.o.   MRN: [id number]   Subjective:   Patient ID: KILA GODINA female   DOB: [birth year] 59 y.o.   MRN: [id number]  HPI: Ms.Tywanna NERIA PROCTER is a 59 y.o. F with a PMHx of HTN, HLD, degenerative joint dx s/p cervical and lumbar fusions, sciatica, and fibromyalgia presenting to the clinic today with a chief complaint of muscle spasms. Patient states she has muscle spasms/ pressure-like pain in her neck that extends to the parietal region of her skull bilaterally. States she has a history of cervical and lumbar fusions and has been having muscle spasms for the past 2 years. States the pain is intermittent, 8/10 in intensity, and worse with movement. As per patient, she has chronic numbness in her arms bilaterally since her surgery for cervical fusions. Denies any changes in vision, nausea, or vomiting. Denies any new focal neurological symptoms such as weakness, numbness, tingling, or paralysis. Patient states she has seen PT in the past and is currently following up with a pain clinic and Dr. Carloyn Manner from neurosurgery.     Past Medical History  Diagnosis Date  . Sciatica   . Depression   . Fibromyalgia   . Encounter for preventive health examination     Next mammogram in 07/2009. Next colonoscopy in 09/2017. DEXA 4/09>Normal  . Encounter for screening colonoscopy 10/03/2007    Normal  . Hyperlipidemia   . Hypertension   . Degenerative joint disease     s/p cervical and lumbar fusions   Current Outpatient Prescriptions  Medication Sig Dispense Refill  . amitriptyline (ELAVIL) 50 MG tablet TAKE 1 TABLET (50 MG TOTAL) BY MOUTH AT BEDTIME. 90 tablet 3  . calcium-vitamin D (OSCAL) 250-125 MG-UNIT per tablet Take 1 tablet by mouth 3 (three) times daily.      . clindamycin (CLEOCIN) 300 MG capsule Take 1 capsule (300 mg total) by mouth 3 (three) times daily. 21 capsule 0  . cyclobenzaprine (FLEXERIL) 5 MG tablet TAKE ONE TABLET BY MOUTH AT  BEDTIME AS NEEDED FOR MUSCLE SPASM 30 tablet 5  . gabapentin (NEURONTIN) 600 MG tablet TAKE ONE TABLET BY MOUTH THREE TIMES DAILY 270 tablet 5  . hydrochlorothiazide (HYDRODIURIL) 25 MG tablet Take 1 tablet (25 mg total) by mouth daily. 90 tablet 4  . Multiple Vitamin (MULTIVITAMIN) capsule Take 1 capsule by mouth daily.      . Omega-3 Fatty Acids (FISH OIL) 300 MG CAPS Take 1 capsule by mouth daily.      Marland Kitchen omeprazole (PRILOSEC) 20 MG capsule Take 1 capsule (20 mg total) by mouth daily. 90 capsule 3   No current facility-administered medications for this visit.   Family History  Problem Relation Age of Onset  . Cancer Mother   . Diabetes Mother    Social History   Social History  . Marital Status: Married    Spouse Name: N/A  . Number of Children: N/A  . Years of Education: N/A   Social History Main Topics  . Smoking status: Former Smoker    Types: Cigarettes    Quit date: 04/03/2003  . Smokeless tobacco: None  . Alcohol Use: 0.0 oz/week    0 Standard drinks or equivalent per week     Comment: special occasions  . Drug Use: No  . Sexual Activity: Not Asked   Other Topics Concern  . None   Social History Narrative   Review of Systems: Review of Systems  Constitutional: Negative for fever, chills and malaise/fatigue.  HENT: Negative for congestion and ear pain.   Eyes: Negative for blurred vision and pain.  Respiratory: Negative for cough, shortness of breath and wheezing.   Cardiovascular: Negative for chest pain and leg swelling.  Gastrointestinal: Negative for nausea, vomiting, abdominal pain, diarrhea and constipation.  Genitourinary: Negative.   Musculoskeletal: Positive for neck pain.       Neck pain extending to the sides of her skull (parietal region) bilaterally   Skin: Negative for itching and rash.  Neurological: Negative for dizziness, sensory change and focal weakness.    Objective:  Physical Exam: Filed Vitals:   01/16/15 1047  BP: 123/92  Pulse:  80  Temp: 97.8 F (36.6 C)  TempSrc: Oral  Height: '5\' 3"'$  (1.6 m)  Weight: 228 lb 3.2 oz (103.511 kg)  SpO2: 96%   Physical Exam  Constitutional: She is oriented to person, place, and time. She appears well-developed and well-nourished. No distress.  HENT:  Head: Normocephalic and atraumatic.  Eyes: EOM are normal. Pupils are equal, round, and reactive to light.  Cardiovascular: Normal rate, regular rhythm and intact distal pulses.   Pulmonary/Chest: Effort normal and breath sounds normal. No respiratory distress. She has no wheezes. She has no rales.  Abdominal: Soft. Bowel sounds are normal. She exhibits no distension. There is no tenderness.  Musculoskeletal: She exhibits tenderness.  Decreased ROM and discomfort upon active extension, lateral rotation, and lateral flexion of the neck. Passive motions not performed due to patient discomfort.   Neurological: She is alert and oriented to person, place, and time. No cranial nerve deficit.  Patient has chronic numbness in her upper extremities bilaterally. Strength grossly intact in bilateral upper and lower extremities. Sensation grossly intact in lower extremities.   Skin: Skin is warm and dry.     Assessment & Plan:

## 2015-02-20 ENCOUNTER — Encounter: Payer: Self-pay | Admitting: Gastroenterology

## 2015-04-04 ENCOUNTER — Ambulatory Visit (INDEPENDENT_AMBULATORY_CARE_PROVIDER_SITE_OTHER): Payer: Medicare HMO | Admitting: Internal Medicine

## 2015-04-04 ENCOUNTER — Ambulatory Visit (HOSPITAL_COMMUNITY)
Admission: RE | Admit: 2015-04-04 | Discharge: 2015-04-04 | Disposition: A | Discharge status: Home / Self Care | Payer: Medicare HMO | Source: Ambulatory Visit | Attending: Internal Medicine | Admitting: Internal Medicine

## 2015-04-04 ENCOUNTER — Encounter: Payer: Self-pay | Admitting: Internal Medicine

## 2015-04-04 VITALS — BP 138/77 | HR 90 | Temp 97.7°F | Ht 63.0 in | Wt 230.4 lb

## 2015-04-04 DIAGNOSIS — E785 Hyperlipidemia, unspecified: Secondary | ICD-10-CM | POA: Diagnosis not present

## 2015-04-04 DIAGNOSIS — R002 Palpitations: Secondary | ICD-10-CM | POA: Diagnosis not present

## 2015-04-04 DIAGNOSIS — Z23 Encounter for immunization: Secondary | ICD-10-CM

## 2015-04-04 DIAGNOSIS — F341 Dysthymic disorder: Secondary | ICD-10-CM

## 2015-04-04 DIAGNOSIS — Z0000 Encounter for general adult medical examination without abnormal findings: Secondary | ICD-10-CM

## 2015-04-04 DIAGNOSIS — R69 Illness, unspecified: Secondary | ICD-10-CM | POA: Diagnosis not present

## 2015-04-04 DIAGNOSIS — F418 Other specified anxiety disorders: Secondary | ICD-10-CM

## 2015-04-04 MED ORDER — HYDROCHLOROTHIAZIDE 25 MG PO TABS: 25.0000 mg | ORAL_TABLET | Freq: Every day | ORAL | Status: DC

## 2015-04-04 NOTE — Patient Instructions (Signed)
Ms. Mccampbell it was nice seeing you today.  -I have ordered some labs and will call you when the results come back.

## 2015-04-05 LAB — CMP14 + ANION GAP
ALBUMIN: 4.6 g/dL (ref 3.5–5.5)
ALK PHOS: 124 IU/L — AB (ref 39–117)
ALT: 173 IU/L — AB (ref 0–32)
AST: 157 IU/L — ABNORMAL HIGH (ref 0–40)
Albumin/Globulin Ratio: 1.6 (ref 1.1–2.5)
Anion Gap: 22 mmol/L — ABNORMAL HIGH (ref 10.0–18.0)
BILIRUBIN TOTAL: 0.7 mg/dL (ref 0.0–1.2)
BUN / CREAT RATIO: 16 (ref 9–23)
BUN: 14 mg/dL (ref 6–24)
CHLORIDE: 100 mmol/L (ref 97–106)
CO2: 20 mmol/L (ref 18–29)
Calcium: 9.9 mg/dL (ref 8.7–10.2)
Creatinine, Ser: 0.89 mg/dL (ref 0.57–1.00)
GFR calc Af Amer: 83 mL/min/{1.73_m2} (ref >59)
GFR calc non Af Amer: 72 mL/min/{1.73_m2} (ref >59)
Globulin, Total: 2.9 g/dL (ref 1.5–4.5)
Glucose: 113 mg/dL — ABNORMAL HIGH (ref 65–99)
POTASSIUM: 4 mmol/L (ref 3.5–5.2)
SODIUM: 142 mmol/L (ref 136–144)
Total Protein: 7.5 g/dL (ref 6.0–8.5)

## 2015-04-05 LAB — TSH: TSH: 2.94 u[IU]/mL (ref 0.450–4.500)

## 2015-04-07 NOTE — Assessment & Plan Note (Addendum)
Patient reports having occasional palpitations lasting a few seconds since childhood. States she gets these episodes whenever she is stressed and overwhelmed by chores at home. As per patient, episodes happen once every few months with most recent being 1 month ago. States she drinks 1 cup of coffee in the morning and no other caffeinated beverages during the day. Denies any associated CP, diaphoresis, or SOB. However, does endorse lightheadedness during these episodes. Denies any skin or hair changes. Patient's symptoms are likely from anxiety/ panic attacks, however, she does report having associated lightheadedness which is concerning for a cardiogenic cause. EKG at this visit showing normal sinus rhythm with a rate of 71 bpm. TSH normal.  -If she continues to have these symptoms in the future, consider cardiology referral for a possible Holter monitor.

## 2015-04-07 NOTE — Progress Notes (Signed)
Patient ID: Misty Decker, female   DOB: 1955/06/27, 59 y.o.   MRN: [id number]   Subjective:   Patient ID: Misty Decker female   DOB: 1955-10-20 59 y.o.   MRN: [id number]  HPI: [name] is a 59 y.o. F with a PMHx of conditions listed below presenting for a follow up of her anxiety and HLD. Please see assessment and plan for details about the patient's chronic medical conditions.     Past Medical History  Diagnosis Date  . Sciatica   . Depression   . Fibromyalgia   . Encounter for preventive health examination     Next mammogram in 07/2009. Next colonoscopy in 09/2017. DEXA 4/09>Normal  . Encounter for screening colonoscopy 10/03/2007    Normal  . Hyperlipidemia   . Hypertension   . Degenerative joint disease     s/p cervical and lumbar fusions   Current Outpatient Prescriptions  Medication Sig Dispense Refill  . amitriptyline (ELAVIL) 50 MG tablet TAKE 1 TABLET (50 MG TOTAL) BY MOUTH AT BEDTIME. 90 tablet 3  . calcium-vitamin D (OSCAL) 250-125 MG-UNIT per tablet Take 1 tablet by mouth 3 (three) times daily.      . cyclobenzaprine (FLEXERIL) 5 MG tablet TAKE ONE TABLET BY MOUTH AT BEDTIME AS NEEDED FOR MUSCLE SPASM 30 tablet 5  . gabapentin (NEURONTIN) 600 MG tablet TAKE ONE TABLET BY MOUTH THREE TIMES DAILY 270 tablet 5  . hydrochlorothiazide (HYDRODIURIL) 25 MG tablet Take 1 tablet (25 mg total) by mouth daily. 90 tablet 3  . Multiple Vitamin (MULTIVITAMIN) capsule Take 1 capsule by mouth daily.      . Omega-3 Fatty Acids (FISH OIL) 300 MG CAPS Take 1 capsule by mouth daily.      Marland Kitchen omeprazole (PRILOSEC) 20 MG capsule Take 1 capsule (20 mg total) by mouth daily. 90 capsule 3  . clindamycin (CLEOCIN) 300 MG capsule Take 1 capsule (300 mg total) by mouth 3 (three) times daily. (Patient not taking: Reported on 04/04/2015) 21 capsule 0   No current facility-administered medications for this visit.   Family History  Problem Relation Age of Onset  . Cancer Mother   .  Diabetes Mother    Social History   Social History  . Marital Status: Married    Spouse Name: N/A  . Number of Children: N/A  . Years of Education: N/A   Social History Main Topics  . Smoking status: Former Smoker    Types: Cigarettes    Quit date: 04/03/2003  . Smokeless tobacco: None  . Alcohol Use: 0.0 oz/week    0 Standard drinks or equivalent per week     Comment: special occasions  . Drug Use: No  . Sexual Activity: Not Asked   Other Topics Concern  . None   Social History Narrative   Review of Systems: Review of Systems  Constitutional: Negative for fever and chills.  HENT: Negative for ear pain.   Eyes: Negative for blurred vision and pain.  Respiratory: Negative for cough, shortness of breath and wheezing.   Cardiovascular: Positive for palpitations. Negative for chest pain and leg swelling.  Gastrointestinal: Negative for nausea, vomiting and abdominal pain.  Musculoskeletal: Negative for myalgias.  Skin: Negative for itching and rash.  Neurological: Negative for sensory change, focal weakness and headaches.  Psychiatric/Behavioral: The patient is nervous/anxious.    Objective:  Physical Exam: Filed Vitals:   04/04/15 1010  BP: 138/77  Pulse: 90  Temp: 97.7 F (36.5 C)  TempSrc:  Oral  Height: '5\' 3"'$  (1.6 m)  Weight: 230 lb 6.4 oz (104.509 kg)  SpO2: 96%   Physical Exam  Constitutional: She is oriented to person, place, and time. She appears well-developed and well-nourished.  HENT:  Head: Normocephalic and atraumatic.  Eyes: EOM are normal. Pupils are equal, round, and reactive to light.  Neck: Neck supple. No tracheal deviation present.  Cardiovascular: Normal rate, regular rhythm and intact distal pulses.   Pulmonary/Chest: Effort normal. No respiratory distress. She has no wheezes. She has no rales.  Abdominal: Soft. Bowel sounds are normal. She exhibits no distension. There is no tenderness.  Musculoskeletal: She exhibits no edema.    Neurological: She is alert and oriented to person, place, and time.  Skin: Skin is warm and dry.   Assessment & Plan:

## 2015-04-07 NOTE — Assessment & Plan Note (Addendum)
3rd dose of Hep B vaccine and flu vaccine administered at this visit.

## 2015-04-07 NOTE — Assessment & Plan Note (Addendum)
Lovastatin was discontinued in 09/2014 in the setting of elevated LFTs. LFTs continue to be elevated as per labs from 01/2015 (AST 178, ALT 176, Alk phos 118). Hepatitis panel negative and iron studies normal in 04/2014. Patient denies any symptoms of jaundice, excessive itching, nausea, vomiting, or abdominal pain. Denies any history of regular alcohol use. Repeat LFTS at this visit showing AST 157, ALT 173, and Alk phos 124.  -RUQ ultrasound -Repeat hepatitis panel at next visit    Addendum (04/08/15 at 1:47pm) - informed patient of the labs and need for an ultrasound. She expressed her understanding and agreed with the plan.

## 2015-04-09 NOTE — Progress Notes (Signed)
Internal Medicine Clinic Attending  I saw and evaluated the patient.  I personally confirmed the key portions of the history and exam documented by Dr. Rathore and I reviewed pertinent patient test results.  The assessment, diagnosis, and plan were formulated together and I agree with the documentation in the resident's note.  

## 2015-04-12 ENCOUNTER — Other Ambulatory Visit: Payer: Self-pay | Admitting: Internal Medicine

## 2015-04-12 NOTE — Telephone Encounter (Signed)
This script was sent in and rec'd by [facility] 10/27 and put on hold, they are filling it now

## 2015-04-12 NOTE — Telephone Encounter (Signed)
Pt requesting hydrochlorothiazide to be filled @ [facility].

## 2015-04-29 ENCOUNTER — Ambulatory Visit (HOSPITAL_COMMUNITY)
Admission: RE | Admit: 2015-04-29 | Discharge: 2015-04-29 | Disposition: A | Discharge status: Home / Self Care | Payer: Medicare HMO | Source: Ambulatory Visit | Attending: Internal Medicine | Admitting: Internal Medicine

## 2015-04-29 DIAGNOSIS — R7989 Other specified abnormal findings of blood chemistry: Secondary | ICD-10-CM | POA: Diagnosis not present

## 2015-04-29 DIAGNOSIS — E785 Hyperlipidemia, unspecified: Secondary | ICD-10-CM | POA: Diagnosis not present

## 2015-05-14 ENCOUNTER — Ambulatory Visit (INDEPENDENT_AMBULATORY_CARE_PROVIDER_SITE_OTHER): Payer: Medicare HMO | Admitting: Internal Medicine

## 2015-05-14 DIAGNOSIS — K760 Fatty (change of) liver, not elsewhere classified: Secondary | ICD-10-CM

## 2015-05-14 DIAGNOSIS — R002 Palpitations: Secondary | ICD-10-CM

## 2015-05-14 NOTE — Patient Instructions (Signed)
Ms. Rosen it was nice seeing you today.  -I have ordered a cardiac monitor for you. Our staff will call you to set up an appointment with cardiology as we discussed today.  -I encourage you to eat healthy and exercise.   -Return for a follow-up visit in 1 month.

## 2015-05-16 DIAGNOSIS — R002 Palpitations: Secondary | ICD-10-CM | POA: Insufficient documentation

## 2015-05-16 NOTE — Assessment & Plan Note (Signed)
04/04/2015 Office Visit Edited 04/08/2015 1:48 PM by Shela Leff, MD   Lovastatin was discontinued in 09/2014 in the setting of elevated LFTs. LFTs continue to be elevated as per labs from 01/2015 (AST 178, ALT 176, Alk phos 118). Hepatitis panel negative and iron studies normal in 04/2014. Patient denies any symptoms of jaundice, excessive itching, nausea, vomiting, or abdominal pain. Denies any history of regular alcohol use. Repeat LFTS at this visit showing AST 157, ALT 173, and Alk phos 124.  -RUQ ultrasound -Repeat hepatitis panel at next visit   Addendum (04/08/15 at 1:47pm) - informed patient of the labs and need for an ultrasound. She expressed her understanding and agreed with the plan.            At present, patient again denies having any symptoms of jaundice, pruritis, nausea, vomiting, or abdominal pain. RUQ US done 04/29/15 showing fatty infiltration of the liver, tiny amount of sludge, and common bile duct mildly prominent at 7.3 mm. No prominent intrahepatic biliary ductal dilation.  -Counseled patient on diet, exercise, and weight loss.  -Repeat LFTs in 6 months

## 2015-05-16 NOTE — Assessment & Plan Note (Addendum)
04/04/2015 Office Visit Edited 04/07/2015 11:03 PM by Shela Leff, MD   Patient reports having occasional palpitations lasting a few seconds since childhood. States she gets these episodes whenever she is stressed and overwhelmed by chores at home. As per patient, episodes happen once every few months with most recent being 1 month ago. States she drinks 1 cup of coffee in the morning and no other caffeinated beverages during the day. Denies any associated CP, diaphoresis, or SOB. However, does endorse lightheadedness during these episodes. Denies any skin or hair changes. Patient's symptoms are likely from anxiety/ panic attacks, however, she does report having associated lightheadedness which is concerning for a cardiogenic cause. EKG at this visit showing normal sinus rhythm with a rate of 71 bpm. TSH normal.  -If she continues to have these symptoms in the future, consider cardiology referral for a possible Holter monitor.            At present, patient still reports having heart palpitations which now occur every 2 weeks. States the palpitations only occur when she is stressed/ overwhelmed by chores. At this visit patient states the palpitations are not associated with lightheadedness. Instead she reports having associated SOB and a "choking sensation." States her symptoms improve after she oraganizes items at home.   -Order for ambulatory cardiac monitor placed today to r/o cardiac cause for her palpitations  -F/u visit in 1 month - if the cardiac monitor does not show any events, consider ordering an Echo.

## 2015-05-16 NOTE — Progress Notes (Signed)
Patient ID: Misty Decker, female   DOB: [birth year], 59 y.o.   MRN: [id number]   Subjective:   Patient ID: Misty Decker female   DOB: [birth year] 59 y.o.   MRN: [id number]  HPI: Ms.Drema YVONNIE SCHINKE is a 59 y.o. F with a PMHx of conditions listed below presenting to the clinic for a follow-up of her heart palpitations and hyperlipidemia. Please see assessment and plan for the status of the patient's chronic medical conditions.     Past Medical History  Diagnosis Date  . Sciatica   . Depression   . Fibromyalgia   . Encounter for preventive health examination     Next mammogram in 07/2009. Next colonoscopy in 09/2017. DEXA 4/09>Normal  . Encounter for screening colonoscopy 10/03/2007    Normal  . Hyperlipidemia   . Hypertension   . Degenerative joint disease     s/p cervical and lumbar fusions   Current Outpatient Prescriptions  Medication Sig Dispense Refill  . amitriptyline (ELAVIL) 50 MG tablet TAKE 1 TABLET (50 MG TOTAL) BY MOUTH AT BEDTIME. 90 tablet 3  . calcium-vitamin D (OSCAL) 250-125 MG-UNIT per tablet Take 1 tablet by mouth 3 (three) times daily.      . cyclobenzaprine (FLEXERIL) 5 MG tablet TAKE ONE TABLET BY MOUTH AT BEDTIME AS NEEDED FOR MUSCLE SPASM 30 tablet 5  . gabapentin (NEURONTIN) 600 MG tablet TAKE ONE TABLET BY MOUTH THREE TIMES DAILY 270 tablet 5  . hydrochlorothiazide (HYDRODIURIL) 25 MG tablet Take 1 tablet (25 mg total) by mouth daily. 90 tablet 3  . Multiple Vitamin (MULTIVITAMIN) capsule Take 1 capsule by mouth daily.      . Omega-3 Fatty Acids (FISH OIL) 300 MG CAPS Take 1 capsule by mouth daily.      Marland Kitchen omeprazole (PRILOSEC) 20 MG capsule Take 1 capsule (20 mg total) by mouth daily. 90 capsule 3  . clindamycin (CLEOCIN) 300 MG capsule Take 1 capsule (300 mg total) by mouth 3 (three) times daily. (Patient not taking: Reported on 04/04/2015) 21 capsule 0   No current facility-administered medications for this visit.   Family History  Problem Relation  Age of Onset  . Cancer Mother   . Diabetes Mother    Social History   Social History  . Marital Status: Married    Spouse Name: N/A  . Number of Children: N/A  . Years of Education: N/A   Social History Main Topics  . Smoking status: Former Smoker    Types: Cigarettes    Quit date: 04/03/2003  . Smokeless tobacco: Not on file  . Alcohol Use: 0.0 oz/week    0 Standard drinks or equivalent per week     Comment: special occasions  . Drug Use: No  . Sexual Activity: Not on file   Other Topics Concern  . Not on file   Social History Narrative   Review of Systems: Constitutional: Negative for fever and chills.  HENT: Negative for ear pain.  Eyes: Negative for blurred vision and pain.  Respiratory: Negative for cough, shortness of breath and wheezing.  Cardiovascular: Positive for palpitations. Negative for chest pain and leg swelling.  Gastrointestinal: Negative for nausea, vomiting and abdominal pain.  Musculoskeletal: Negative for myalgias.  Skin: Negative for itching and rash.  Neurological: Negative for sensory change, focal weakness and headaches.   Objective:  Physical Exam: There were no vitals filed for this visit. Constitutional: She is oriented to person, place, and time. She appears well-developed and well-nourished.  HENT:  Head: Normocephalic and atraumatic.  Eyes: EOM are normal. Pupils are equal, round, and reactive to light.  Neck: Neck supple. No tracheal deviation present.  Cardiovascular: Normal rate, regular rhythm and intact distal pulses.  Pulmonary/Chest: Effort normal. No respiratory distress. She has no wheezes. She has no rales.  Abdominal: Soft. Bowel sounds are normal. She exhibits no distension. There is no tenderness.  Musculoskeletal: She exhibits no edema.  Neurological: She is alert and oriented to person, place, and time.  Skin: Skin is warm and dry.   Assessment & Plan:

## 2015-05-17 NOTE — Progress Notes (Signed)
Internal Medicine Clinic Attending  I saw and evaluated the patient.  I personally confirmed the key portions of the history and exam documented by Dr. Rathore and I reviewed pertinent patient test results.  The assessment, diagnosis, and plan were formulated together and I agree with the documentation in the resident's note.  

## 2015-05-17 NOTE — Addendum Note (Signed)
Addended by: Lalla Brothers T on: 05/17/2015 01:46 PM   Modules accepted: Level of Service

## 2015-06-11 ENCOUNTER — Ambulatory Visit: Payer: Medicare HMO | Admitting: Internal Medicine

## 2015-07-09 ENCOUNTER — Other Ambulatory Visit: Payer: Self-pay | Admitting: Internal Medicine

## 2015-07-10 ENCOUNTER — Other Ambulatory Visit: Payer: Self-pay

## 2015-07-10 DIAGNOSIS — Z1231 Encounter for screening mammogram for malignant neoplasm of breast: Secondary | ICD-10-CM

## 2015-07-10 DIAGNOSIS — Z803 Family history of malignant neoplasm of breast: Secondary | ICD-10-CM

## 2015-07-30 DIAGNOSIS — M47816 Spondylosis without myelopathy or radiculopathy, lumbar region: Secondary | ICD-10-CM | POA: Diagnosis not present

## 2015-07-30 DIAGNOSIS — E669 Obesity, unspecified: Secondary | ICD-10-CM | POA: Diagnosis not present

## 2015-07-30 DIAGNOSIS — M4302 Spondylolysis, cervical region: Secondary | ICD-10-CM | POA: Diagnosis not present

## 2015-08-07 ENCOUNTER — Other Ambulatory Visit: Payer: Self-pay | Admitting: Internal Medicine

## 2015-08-15 ENCOUNTER — Ambulatory Visit
Admission: RE | Admit: 2015-08-15 | Discharge: 2015-08-15 | Disposition: A | Discharge status: Home / Self Care | Payer: Medicare HMO | Source: Ambulatory Visit

## 2015-08-15 DIAGNOSIS — Z803 Family history of malignant neoplasm of breast: Secondary | ICD-10-CM

## 2015-08-15 DIAGNOSIS — Z1231 Encounter for screening mammogram for malignant neoplasm of breast: Secondary | ICD-10-CM

## 2015-09-09 ENCOUNTER — Other Ambulatory Visit: Payer: Self-pay | Admitting: Student in an Organized Health Care Education/Training Program

## 2015-09-21 ENCOUNTER — Emergency Department (HOSPITAL_COMMUNITY)
Admission: EM | Admit: 2015-09-21 | Discharge: 2015-09-21 | Disposition: A | Discharge status: Home / Self Care | Payer: Medicare HMO | Attending: Emergency Medicine | Admitting: Emergency Medicine

## 2015-09-21 ENCOUNTER — Encounter (HOSPITAL_COMMUNITY): Payer: Self-pay | Admitting: *Deleted

## 2015-09-21 ENCOUNTER — Emergency Department (HOSPITAL_COMMUNITY): Payer: Medicare HMO

## 2015-09-21 DIAGNOSIS — Z91040 Latex allergy status: Secondary | ICD-10-CM | POA: Diagnosis not present

## 2015-09-21 DIAGNOSIS — M797 Fibromyalgia: Secondary | ICD-10-CM | POA: Insufficient documentation

## 2015-09-21 DIAGNOSIS — W010XXA Fall on same level from slipping, tripping and stumbling without subsequent striking against object, initial encounter: Secondary | ICD-10-CM | POA: Diagnosis not present

## 2015-09-21 DIAGNOSIS — E785 Hyperlipidemia, unspecified: Secondary | ICD-10-CM | POA: Diagnosis not present

## 2015-09-21 DIAGNOSIS — Z79899 Other long term (current) drug therapy: Secondary | ICD-10-CM | POA: Diagnosis not present

## 2015-09-21 DIAGNOSIS — Y9289 Other specified places as the place of occurrence of the external cause: Secondary | ICD-10-CM | POA: Diagnosis not present

## 2015-09-21 DIAGNOSIS — Y9389 Activity, other specified: Secondary | ICD-10-CM | POA: Diagnosis not present

## 2015-09-21 DIAGNOSIS — S62316A Displaced fracture of base of fifth metacarpal bone, right hand, initial encounter for closed fracture: Secondary | ICD-10-CM | POA: Diagnosis not present

## 2015-09-21 DIAGNOSIS — S62396A Other fracture of fifth metacarpal bone, right hand, initial encounter for closed fracture: Secondary | ICD-10-CM | POA: Diagnosis not present

## 2015-09-21 DIAGNOSIS — Z87891 Personal history of nicotine dependence: Secondary | ICD-10-CM | POA: Insufficient documentation

## 2015-09-21 DIAGNOSIS — F329 Major depressive disorder, single episode, unspecified: Secondary | ICD-10-CM | POA: Insufficient documentation

## 2015-09-21 DIAGNOSIS — Y990 Civilian activity done for income or pay: Secondary | ICD-10-CM | POA: Diagnosis not present

## 2015-09-21 DIAGNOSIS — Z880 Allergy status to penicillin: Secondary | ICD-10-CM | POA: Diagnosis not present

## 2015-09-21 DIAGNOSIS — I10 Essential (primary) hypertension: Secondary | ICD-10-CM | POA: Insufficient documentation

## 2015-09-21 DIAGNOSIS — S6991XA Unspecified injury of right wrist, hand and finger(s), initial encounter: Secondary | ICD-10-CM | POA: Diagnosis present

## 2015-09-21 DIAGNOSIS — R69 Illness, unspecified: Secondary | ICD-10-CM | POA: Diagnosis not present

## 2015-09-21 DIAGNOSIS — S62306A Unspecified fracture of fifth metacarpal bone, right hand, initial encounter for closed fracture: Secondary | ICD-10-CM

## 2015-09-21 MED ORDER — HYDROCODONE-ACETAMINOPHEN 5-325 MG PO TABS
1.0000 | ORAL_TABLET | Freq: Once | ORAL | Status: AC
  Administered 2015-09-21: 1 via ORAL
  Filled 2015-09-21: qty 1

## 2015-09-21 MED ORDER — HYDROCODONE-ACETAMINOPHEN 5-325 MG PO TABS: 1.0000 | ORAL_TABLET | ORAL | Status: DC | PRN

## 2015-09-21 NOTE — ED Provider Notes (Signed)
CSN: [id number]     Arrival date & time 09/21/15  4268 History  By signing my name below, I, Julien Nordmann, attest that this documentation has been prepared under the direction and in the presence of Bastian Andreoli Y Jovontae Banko, [place]. Electronically Signed: Julien Nordmann, ED Scribe. 09/21/2015. 10:30 AM.    Chief Complaint  Patient presents with  . Hand Injury  . Wrist Pain      The history is provided by the patient. No language interpreter was used.   HPI Comments: Misty Decker is a 60 y.o. female who has a PMHx of HTN, HLD, and fibromyalgia presents to the Emergency Department complaining of a fall that occurred yesterday. She complains of sudden onset, gradual worsening, right wrist pain with associated swelling. Pt states she was working outside when she tripped and fell slightly downhill. She notes falling on her right hand and injuring her right wrist. Pt says that twisting of her wrist increases the pain. Pt is unable to take NSAIDs due to having abdominal lesions so she has not taken any medication to alleviate her pain. Denies numbness and tingling.   Past Medical History  Diagnosis Date  . Sciatica   . Depression   . Fibromyalgia   . Encounter for preventive health examination     Next mammogram in 07/2009. Next colonoscopy in 09/2017. DEXA 4/09>Normal  . Encounter for screening colonoscopy 10/03/2007    Normal  . Hyperlipidemia   . Hypertension   . Degenerative joint disease     s/p cervical and lumbar fusions   Past Surgical History  Procedure Laterality Date  . Total abdominal hysterectomy    . Cervical fusion      X 3  . Lumbar fusion      X 2  . Tonsillectomy    . Rectal prolapse repair     Family History  Problem Relation Age of Onset  . Cancer Mother   . Diabetes Mother    Social History  Substance Use Topics  . Smoking status: Former Smoker    Types: Cigarettes    Quit date: 04/03/2003  . Smokeless tobacco: Not on file  . Alcohol Use: 0.0 oz/week    0  Standard drinks or equivalent per week     Comment: special occasions   OB History    No data available     Review of Systems  Musculoskeletal: Positive for arthralgias (right wrist pain).  Neurological: Negative for numbness.  All other systems reviewed and are negative.     Allergies  Codeine; Doxycycline; Latex; Morphine; Penicillins; and Tramadol  Home Medications   Prior to Admission medications   Medication Sig Start Date End Date Taking? Authorizing Provider  amitriptyline (ELAVIL) 50 MG tablet TAKE ONE TABLET BY MOUTH AT BEDTIME 07/10/15   Shela Leff, MD  calcium-vitamin D (OSCAL) 250-125 MG-UNIT per tablet Take 1 tablet by mouth 3 (three) times daily.      Historical Provider, MD  clindamycin (CLEOCIN) 300 MG capsule Take 1 capsule (300 mg total) by mouth 3 (three) times daily. Patient not taking: Reported on 04/04/2015 11/21/13   Otho Bellows, MD  cyclobenzaprine (FLEXERIL) 5 MG tablet TAKE ONE TABLET BY MOUTH AT BEDTIME AS NEEDED FOR MUSCLE SPASM 09/09/15   Shela Leff, MD  gabapentin (NEURONTIN) 600 MG tablet TAKE ONE TABLET BY MOUTH THREE TIMES DAILY 12/20/14   Bartholomew Crews, MD  hydrochlorothiazide (HYDRODIURIL) 25 MG tablet Take 1 tablet (25 mg total) by mouth daily. 04/04/15  Shela Leff, MD  Multiple Vitamin (MULTIVITAMIN) capsule Take 1 capsule by mouth daily.      Historical Provider, MD  Omega-3 Fatty Acids (FISH OIL) 300 MG CAPS Take 1 capsule by mouth daily.      Historical Provider, MD  omeprazole (PRILOSEC) 20 MG capsule Take 1 capsule (20 mg total) by mouth daily. 11/21/14   Luan Moore, MD   Triage vitals: BP 115/58 mmHg  Pulse 81  Temp(Src) 98 F (36.7 C) (Oral)  Resp 16  Ht '5\' 5"'$  (1.651 m)  Wt 220 lb (99.791 kg)  BMI 36.61 kg/m2  SpO2 98% Physical Exam  Constitutional: She appears well-developed and well-nourished. No distress.  HENT:  Head: Normocephalic and atraumatic.  Eyes: Right eye exhibits no discharge. Left eye  exhibits no discharge.  Pulmonary/Chest: Effort normal. No respiratory distress.  Musculoskeletal:  Right hand has bruising on medial dorsum and palm. Focal tenderness at the base of 5th MCP. Tenderness on radial wrist. Full ROM of wrist. Brisk cap refill x5. Sensation intact. No stuff box tenderness.   Neurological: She is alert. Coordination normal.  Skin: No rash noted. She is not diaphoretic.  Psychiatric: She has a normal mood and affect. Her behavior is normal.  Nursing note and vitals reviewed.   ED Course  Procedures  DIAGNOSTIC STUDIES: Oxygen Saturation is 98% on RA, normal by my interpretation.  COORDINATION OF CARE:  10:30 AM Will order x-ray of right wrist. Discussed treatment plan with pt at bedside and pt agreed to plan.  Labs Review Labs Reviewed - No data to display  Imaging Review Dg Wrist Complete Right  09/21/2015  CLINICAL DATA:  Initial encounter for Patient states she fell on wrist. Bruising and swelling by 5th digit and wrist area. Best possible film on navicular patient unable to move wrist EXAM: RIGHT WRIST - COMPLETE 3+ VIEW COMPARISON:  Hand films, dictated separately. FINDINGS: Soft tissue swelling about the carpal bones, primarily ulnarly. A minimally comminuted fracture at the base of the fifth metacarpal is better visualized on hand films. No other fracture identified. Scaphoid intact. IMPRESSION: Minimally comminuted fracture at the base of the fifth metacarpal. Electronically Signed   By: Abigail Miyamoto M.D.   On: 09/21/2015 11:30   Dg Hand Complete Right  09/21/2015  CLINICAL DATA:  Initial encounter for Patient states she fell on wrist. Bruising and swelling by 5th digit and wrist area. Best possible film on navicular patient unable to move wrist EXAM: RIGHT HAND - COMPLETE 3+ VIEW COMPARISON:  Wrist films same date FINDINGS: Transverse, minimally comminuted fracture at the base of the fifth metacarpal. No intra-articular extension. Soft tissue swelling.  IMPRESSION: Fracture at the base of the fifth metacarpal, with minimal comminution. Electronically Signed   By: Abigail Miyamoto M.D.   On: 09/21/2015 11:31   I have personally reviewed and evaluated these images and lab results as part of my medical decision-making.   EKG Interpretation None      MDM   Final diagnoses:  Fracture of fifth metacarpal bone of right hand, closed, initial encounter   X-ray reveals closed, minimally comminuted transverse fracture at base of the fifth metacarpal with associated soft tissue swelling. Pt is neurovascularly intact. Placed in a splint with instructions to f/u with hand/Dr. Fredna Dow. Rx given for pain meds as needed. Pt has multiple medication intolerances but states she can take norco. ER return precautions given.   I personally performed the services described in this documentation, which was scribed in my  presence. The recorded information has been reviewed and is accurate.   Anne Ng, PA-C 09/21/15 [place], MD 09/24/15 [phone]

## 2015-09-21 NOTE — Progress Notes (Signed)
Orthopedic Tech Progress Note Patient Details:  Misty Decker 15-Jul-1955 [id number]  Ortho Devices Type of Ortho Device: Ace wrap, Ulna gutter splint Ortho Device/Splint Location: rue Ortho Device/Splint Interventions: Application   [name] 09/21/2015, 12:15 PM

## 2015-09-21 NOTE — Discharge Instructions (Signed)
Please call Dr. Levell July office to schedule a follow up appointment. In the meantime you may take Norco as needed for pain. Keep your hand elevated when possible. Return to the ER for new or worsening symptoms.   Metacarpal Fracture A metacarpal fracture is a break (fracture) of a bone in the hand. Metacarpals are the bones that extend from your knuckles to your wrist. In each hand, you have five metacarpal bones that connect your fingers and your thumb to your wrist. Some hand fractures have bone pieces that are close together and stable (simple). These fractures may be treated with only a splint or cast. Hand fractures that have many pieces of broken bone (comminuted), unstable bone pieces (displaced), or a bone that breaks through the skin (compound) usually require surgery. CAUSES This injury may be caused by:  A fall.  A hard, direct hit to your hand.  An injury that squeezes your knuckle, stretches your finger out of place, or crushes your hand. RISK FACTORS This injury is more likely to occur if:  You play contact sports.  You have certain bone diseases. SYMPTOMS  Symptoms of this type of fracture develop soon after the injury. Symptoms may include:  Swelling.  Pain.  Stiffness.  Increased pain with movement.  Bruising.  Inability to move a finger.  A shortened finger.  A finger knuckle that looks sunken in.  Unusual appearance of the hand or finger (deformity). DIAGNOSIS  This injury may be diagnosed based on your signs and symptoms, especially if you had a recent hand injury. Your health care provider will perform a physical exam. He or she may also order X-rays to confirm the diagnosis.  TREATMENT  Treatment for this injury depends on the type of fracture you have and how severe it is. Possible treatments include:  Non-reduction. This can be done if the bone does not need to be moved back into place. The fracture can be casted or splinted as it is.   Closed  reduction. If your bone is stable and can be moved back into place, you may only need to wear a cast or splint or have buddy taping.  Closed reduction with internal fixation (CRIF). This is the most common treatment. You may have this procedure if your bone can be moved back into place but needs more support. Wires, pins, or screws may be inserted through your skin to stabilize the fracture.  Open reduction with internal fixation (ORIF). This may be needed if your fracture is severe and unstable. It involves surgery to move your bone back into the right position. Screws, wires, or plates are used to stabilize the fracture. After all procedures, you may need to wear a cast or a splint for several weeks. You will also need to have follow-up X-rays to make sure that the bone is healing well and staying in position. After you no longer need your cast or splint, you may need physical therapy. This will help you to regain full movement and strength in your hand.  HOME CARE INSTRUCTIONS  If You Have a Cast:  Do not stick anything inside the cast to scratch your skin. Doing that increases your risk of infection.  Check the skin around the cast every day. Report any concerns to your health care provider. You may put lotion on dry skin around the edges of the cast. Do not apply lotion to the skin underneath the cast. If You Have a Splint:  Wear it as directed by your health  care provider. Remove it only as directed by your health care provider.  Loosen the splint if your fingers become numb and tingle, or if they turn cold and blue. Bathing  Cover the cast or splint with a watertight plastic bag to protect it from water while you take a bath or a shower. Do not let the cast or splint get wet. Managing Pain, Stiffness, and Swelling  If directed, apply ice to the injured area (if you have a splint, not a cast):  Put ice in a plastic bag.  Place a towel between your skin and the bag.  Leave the ice on  for 20 minutes, 2-3 times a day.  Move your fingers often to avoid stiffness and to lessen swelling.  Raise the injured area above the level of your heart while you are sitting or lying down. Driving  Do not drive or operate heavy machinery while taking pain medicine.  Do not drive while wearing a cast or splint on a hand that you use for driving. Activity  Return to your normal activities as directed by your health care provider. Ask your health care provider what activities are safe for you. General Instructions  Do not put pressure on any part of the cast or splint until it is fully hardened. This may take several hours.  Keep the cast or splint clean and dry.  Do not use any tobacco products, including cigarettes, chewing tobacco, or electronic cigarettes. Tobacco can delay bone healing. If you need help quitting, ask your health care provider.  Take medicines only as directed by your health care provider.  Keep all follow-up visits as directed by your health care provider. This is important. SEEK MEDICAL CARE IF:   Your pain is getting worse.  You have redness, swelling, or pain in the injured area.   You have fluid, blood, or pus coming from under your cast or splint.   You notice a bad smell coming from under your cast or splint.   You have a fever.  SEEK IMMEDIATE MEDICAL CARE IF:   You develop a rash.   You have trouble breathing.   Your skin or nails on your injured hand turn blue or gray even after you loosen your splint.  Your injured hand feels cold or becomes numb even after you loosen your splint.   You develop severe pain under the cast or in your hand.   This information is not intended to replace advice given to you by your health care provider. Make sure you discuss any questions you have with your health care provider.   Document Released: 05/25/2005 Document Revised: 02/13/2015 Document Reviewed: 03/14/2014 Elsevier Interactive Patient  Education [employer].

## 2015-09-21 NOTE — ED Notes (Signed)
Declined W/C at D/C and was escorted to lobby by RN. 

## 2015-09-21 NOTE — ED Notes (Signed)
PT reports she tripped and fell on Friday while working outside. Pt fell onto the RT hand and wrist.

## 2015-09-28 DIAGNOSIS — S62366A Nondisplaced fracture of neck of fifth metacarpal bone, right hand, initial encounter for closed fracture: Secondary | ICD-10-CM | POA: Diagnosis not present

## 2015-09-28 DIAGNOSIS — M79644 Pain in right finger(s): Secondary | ICD-10-CM | POA: Diagnosis not present

## 2015-10-07 ENCOUNTER — Ambulatory Visit (INDEPENDENT_AMBULATORY_CARE_PROVIDER_SITE_OTHER): Payer: Medicare HMO | Admitting: Internal Medicine

## 2015-10-07 ENCOUNTER — Encounter: Payer: Self-pay | Admitting: Internal Medicine

## 2015-10-07 ENCOUNTER — Ambulatory Visit (HOSPITAL_COMMUNITY)
Admission: RE | Admit: 2015-10-07 | Discharge: 2015-10-07 | Disposition: A | Discharge status: Home / Self Care | Payer: Medicare HMO | Source: Ambulatory Visit | Attending: Internal Medicine | Admitting: Internal Medicine

## 2015-10-07 VITALS — Temp 92.2°F | Wt 231.7 lb

## 2015-10-07 DIAGNOSIS — R42 Dizziness and giddiness: Secondary | ICD-10-CM | POA: Diagnosis not present

## 2015-10-07 DIAGNOSIS — R51 Headache: Secondary | ICD-10-CM

## 2015-10-07 DIAGNOSIS — G4486 Cervicogenic headache: Secondary | ICD-10-CM

## 2015-10-07 LAB — BASIC METABOLIC PANEL
Anion gap: 12 (ref 5–15)
BUN: 18 mg/dL (ref 6–20)
CALCIUM: 9.7 mg/dL (ref 8.9–10.3)
CO2: 23 mmol/L (ref 22–32)
CREATININE: 0.78 mg/dL (ref 0.44–1.00)
Chloride: 105 mmol/L (ref 101–111)
GFR calc Af Amer: >60 mL/min (ref >60)
GLUCOSE: 120 mg/dL — AB (ref 65–99)
POTASSIUM: 3.6 mmol/L (ref 3.5–5.1)
SODIUM: 140 mmol/L (ref 135–145)

## 2015-10-07 MED ORDER — GABAPENTIN 400 MG PO CAPS: 400.0000 mg | ORAL_CAPSULE | Freq: Three times a day (TID) | ORAL | Status: DC

## 2015-10-07 MED ORDER — AMITRIPTYLINE HCL 50 MG PO TABS: 50.0000 mg | ORAL_TABLET | Freq: Every day | ORAL | Status: DC

## 2015-10-07 MED ORDER — CYCLOBENZAPRINE HCL 5 MG PO TABS: ORAL_TABLET | ORAL | Status: DC

## 2015-10-07 NOTE — Assessment & Plan Note (Signed)
A: Patient requesting cyclobenzaprine and amitriptyline refill.  P: Refilled these medications.

## 2015-10-07 NOTE — Progress Notes (Signed)
Subjective:    Patient ID: Misty Decker, female    DOB: 01/05/1956, 60 y.o.   MRN: [id number]  HPI  Misty Decker is 60 year old woman with a PMH as below who presents with a dizziness sensation after a recent fall. Her fall occurred two weeks ago. She denies any prodromal symptoms, did not lose consciousness, and attributed the fall due to a simple trip over a mound of dirt. The patient fell and hurt her right hand, which was found to be a comminuted fracture seen on Xray in the ED. She also hit the back of her head, but did not mention this in the ED visit. She's has an occipital headache since then. Since last Friday, she also describes a new onset "swimmy" sensation whenever she has a quick movement or stands from rest. She also notices that she walks slower. She is more irritable and feels easily confused. She denies any vision changes. She has never felt this sensation before. She takes multiple anticholinergic medications at home. She is also requesting refills on her medications.   Active Ambulatory Problems    Diagnosis Date Noted  . Hyperlipidemia 09/23/2006  . ANXIETY DEPRESSION 10/23/2009  . Essential hypertension 09/23/2006  . DEGENERATIVE JOINT DISEASE 09/23/2006  . SCIATICA 01/23/2010  . FIBROMYALGIA 05/16/2009  . HYSTERECTOMY, TOTAL, HX OF 09/23/2006  . History of spinal surgery 05/05/2011  . Back pain 05/05/2011  . Rash of neck 04/25/2012  . Right arm pain 04/25/2012  . Cervicogenic headache 04/25/2012  . Abnormality of gait 09/14/2012  . Gastroesophageal reflux disease without esophagitis 05/08/2013  . Nonalcoholic fatty liver disease 02/27/2014  . Health care maintenance 04/26/2014  . Keloid 10/04/2014  . Allergy 10/04/2014  . Neck pain 01/17/2015  . Elevated hemoglobin ([place]) 01/17/2015  . Heart palpitations 05/16/2015  . Dizziness and giddiness 10/07/2015   Resolved Ambulatory Problems    Diagnosis Date Noted  . ABDOMINAL ULTRASOUND, ABNORMAL 02/13/2010  .  Cellulitis of leg, left 11/21/2013  . Skin lesion 04/26/2014   Past Medical History  Diagnosis Date  . Depression   . Fibromyalgia   . Encounter for preventive health examination   . Encounter for screening colonoscopy 10/03/2007  . Hypertension   . Degenerative joint disease      Review of Systems  Constitutional: Negative for chills and fatigue.  HENT: Negative for congestion, sore throat and tinnitus.   Eyes: Positive for photophobia. Negative for visual disturbance.  Respiratory: Negative for shortness of breath and wheezing.   Cardiovascular: Negative for chest pain and palpitations.  Genitourinary: Negative for dysuria.  Musculoskeletal: Positive for back pain and arthralgias.  Neurological: Positive for dizziness and headaches.       Objective:   Physical Exam  Constitutional: She appears well-developed and well-nourished. No distress.  HENT:  Head: Normocephalic and atraumatic.  Mouth/Throat: Oropharynx is clear and moist. No oropharyngeal exudate.  Eyes: EOM are normal. Pupils are equal, round, and reactive to light. No scleral icterus.  No nystagmus.  Neck: Neck supple.  Limited rotation to left.  Cardiovascular: Normal rate, regular rhythm and normal heart sounds.   Pulmonary/Chest: Effort normal and breath sounds normal. No respiratory distress. She has no wheezes.  Abdominal: Soft. Bowel sounds are normal. She exhibits no distension. There is no tenderness.  Musculoskeletal:  Right wrist in cast.  Neurological: She is alert.  Negative Dix-Hallpike maneuver. Normal sensation in hands bilaterally.  Skin: Skin is warm and dry.  Psychiatric: She has a normal mood  and affect. Her behavior is normal.  Vitals reviewed.         Assessment & Plan:   Please see problem based assessment and plan for details.

## 2015-10-07 NOTE — Assessment & Plan Note (Signed)
A: DDx includes post-concussive syndrome, SDH, electrolyte abnormalities, polypharmacy. She had negative orthostatics in the clinic. BMET was reassuring. We will obtain a CT head to rule out a posttraumatic SDH, if negative, this likely represents a post-concussive syndrome exacerbated by her polypharmacy. Patient unwilling to decrease amitriptyline and cyclobenzaprine at this time bu she is agreeable to decreasing Gabapentin.   P: - CT Head - Decrease Gabapentin 600 mg TID to 400 mg TID - Referral to neurology if not improved in 2 months.

## 2015-10-07 NOTE — Patient Instructions (Signed)
Misty Decker,  It was a joy meeting you today.  For your dizziness and headache, we will obtain a CAT scan of your head. We will check some blood as well. We are also decreasing your Gabapentin dose from 600 mg to 400 mg three times a day. It is possible your symptoms are from concussion, which should resolve on its own. If it does not, we will set you up with a neurologist. We are investigating other causes, too, as described above.   Please follow up with [place] in 2 months to see if your symptoms have resolved.   Concussion, Adult A concussion is a brain injury. It is caused by:  A hit to the head.  A quick and sudden movement (jolt) of the head or neck. A concussion is usually not life threatening. Even so, it can cause serious problems. If you had a concussion before, you may have concussion-like problems after a hit to your head. HOME CARE General Instructions  Follow your doctor's directions carefully.  Take medicines only as told by your doctor.  Only take medicines your doctor says are safe.  Do not drink alcohol until your doctor says it is okay. Alcohol and some drugs can slow down healing. They can also put you at risk for further injury.  If you are having trouble remembering things, write them down.  Try to do one thing at a time if you get distracted easily. For example, do not watch TV while making dinner.  Talk to your family members or close friends when making important decisions.  Follow up with your doctor as told.  Watch your symptoms. Tell others to do the same. Serious problems can sometimes happen after a concussion. Older adults are more likely to have these problems.  Tell your teachers, school nurse, school counselor, coach, Product/process development scientist, or work Freight forwarder about your concussion. Tell them about what you can or cannot do. They should watch to see if:  It gets even harder for you to pay attention or concentrate.  It gets even harder for you to remember  things or learn new things.  You need more time than normal to finish things.  You become annoyed (irritable) more than before.  You are not able to deal with stress as well.  You have more problems than before.  Rest. Make sure you:  Get plenty of sleep at night.  Go to sleep early.  Go to bed at the same time every day. Try to wake up at the same time.  Rest during the day.  Take naps when you feel tired.  Limit activities where you have to think a lot or concentrate. These include:  Doing homework.  Doing work related to a job.  Watching TV.  Using the computer. Returning To Your Regular Activities Return to your normal activities slowly, not all at once. You must give your body and brain enough time to heal.   Do not play sports or do other athletic activities until your doctor says it is okay.  Ask your doctor when you can drive, ride a bicycle, or work other vehicles or machines. Never do these things if you feel dizzy.  Ask your doctor about when you can return to work or school. Preventing Another Concussion It is very important to avoid another brain injury, especially before you have healed. In rare cases, another injury can lead to permanent brain damage, brain swelling, or death. The risk of this is greatest during the first  7-10 days after your injury. Avoid injuries by:   Wearing a seat belt when riding in a car.  Not drinking too much alcohol.  Avoiding activities that could lead to a second concussion (such as contact sports).  Wearing a helmet when doing activities like:  Biking.  Skiing.  Skateboarding.  Skating.  Making your home safer by:  Removing things from the floor or stairways that could make you trip.  Using grab bars in bathrooms and handrails by stairs.  Placing non-slip mats on floors and in bathtubs.  Improve lighting in dark areas. GET HELP IF:  It gets even harder for you to pay attention or concentrate.  It gets  even harder for you to remember things or learn new things.  You need more time than normal to finish things.  You become annoyed (irritable) more than before.  You are not able to deal with stress as well.  You have more problems than before.  You have problems keeping your balance.  You are not able to react quickly when you should. Get help if you have any of these problems for more than 2 weeks:   Lasting (chronic) headaches.  Dizziness or trouble balancing.  Feeling sick to your stomach (nausea).  Seeing (vision) problems.  Being affected by noises or light more than normal.  Feeling sad, low, down in the dumps, blue, gloomy, or empty (depressed).  Mood changes (mood swings).  Feeling of fear or nervousness about what may happen (anxiety).  Feeling annoyed.  Memory problems.  Problems concentrating or paying attention.  Sleep problems.  Feeling tired all the time. GET HELP RIGHT AWAY IF:   You have bad headaches or your headaches get worse.  You have weakness (even if it is in one hand, leg, or part of the face).  You have loss of feeling (numbness).  You feel off balance.  You keep throwing up (vomiting).  You feel tired.  One black center of your eye (pupil) is larger than the other.  You twitch or shake violently (convulse).  Your speech is not clear (slurred).  You are more confused, easily angered (agitated), or annoyed than before.  You have more trouble resting than before.  You are unable to recognize people or places.  You have neck pain.  It is difficult to wake you up.  You have unusual behavior changes.  You pass out (lose consciousness). MAKE SURE YOU:   Understand these instructions.  Will watch your condition.  Will get help right away if you are not doing well or get worse.   This information is not intended to replace advice given to you by your health care provider. Make sure you discuss any questions you have  with your health care provider.   Document Released: 05/13/2009 Document Revised: 06/15/2014 Document Reviewed: 12/15/2012 Elsevier Interactive Patient Education [employer].

## 2015-10-07 NOTE — Progress Notes (Signed)
Internal Medicine Clinic Attending  Case discussed with Dr. Ford at the time of the visit.  We reviewed the resident's history and exam and pertinent patient test results.  I agree with the assessment, diagnosis, and plan of care documented in the resident's note.  

## 2015-10-14 DIAGNOSIS — S62366D Nondisplaced fracture of neck of fifth metacarpal bone, right hand, subsequent encounter for fracture with routine healing: Secondary | ICD-10-CM | POA: Diagnosis not present

## 2015-11-01 DIAGNOSIS — G5602 Carpal tunnel syndrome, left upper limb: Secondary | ICD-10-CM | POA: Diagnosis not present

## 2015-11-01 DIAGNOSIS — M79644 Pain in right finger(s): Secondary | ICD-10-CM | POA: Diagnosis not present

## 2015-11-01 DIAGNOSIS — S62366D Nondisplaced fracture of neck of fifth metacarpal bone, right hand, subsequent encounter for fracture with routine healing: Secondary | ICD-10-CM | POA: Diagnosis not present

## 2015-11-07 ENCOUNTER — Other Ambulatory Visit: Payer: Self-pay | Admitting: Internal Medicine

## 2015-12-06 DIAGNOSIS — S62366D Nondisplaced fracture of neck of fifth metacarpal bone, right hand, subsequent encounter for fracture with routine healing: Secondary | ICD-10-CM | POA: Diagnosis not present

## 2015-12-09 ENCOUNTER — Other Ambulatory Visit: Payer: Self-pay | Admitting: Internal Medicine

## 2015-12-17 ENCOUNTER — Encounter: Payer: Self-pay | Admitting: Internal Medicine

## 2015-12-17 ENCOUNTER — Ambulatory Visit (INDEPENDENT_AMBULATORY_CARE_PROVIDER_SITE_OTHER): Payer: Medicare HMO | Admitting: Internal Medicine

## 2015-12-17 VITALS — BP 145/67 | HR 86 | Temp 97.5°F | Ht 63.0 in | Wt 235.4 lb

## 2015-12-17 DIAGNOSIS — I10 Essential (primary) hypertension: Secondary | ICD-10-CM

## 2015-12-17 DIAGNOSIS — Z87891 Personal history of nicotine dependence: Secondary | ICD-10-CM | POA: Diagnosis not present

## 2015-12-17 DIAGNOSIS — R42 Dizziness and giddiness: Secondary | ICD-10-CM | POA: Diagnosis not present

## 2015-12-17 DIAGNOSIS — K760 Fatty (change of) liver, not elsewhere classified: Secondary | ICD-10-CM

## 2015-12-17 DIAGNOSIS — R002 Palpitations: Secondary | ICD-10-CM

## 2015-12-17 MED ORDER — HYDROCHLOROTHIAZIDE 25 MG PO TABS: 25.0000 mg | ORAL_TABLET | Freq: Every day | ORAL | Status: DC

## 2015-12-17 MED ORDER — SERTRALINE HCL 25 MG PO TABS: 25.0000 mg | ORAL_TABLET | Freq: Every day | ORAL | Status: DC

## 2015-12-17 MED ORDER — SERTRALINE HCL 50 MG PO TABS: 50.0000 mg | ORAL_TABLET | Freq: Every day | ORAL | Status: DC

## 2015-12-17 NOTE — Patient Instructions (Signed)
STOP taking Elavil  Start taking Zoloft:  Take 25 mg daily for 1 week, then take 50 mg daily thereafter.   You may take over-the-counter Melatonin for sleep.   Return for a follow-up visit in 1 month.

## 2015-12-18 LAB — CMP14 + ANION GAP
ALBUMIN: 4.5 g/dL (ref 3.5–5.5)
ALT: 158 IU/L — AB (ref 0–32)
ANION GAP: 23 mmol/L — AB (ref 10.0–18.0)
AST: 159 IU/L — ABNORMAL HIGH (ref 0–40)
Albumin/Globulin Ratio: 1.6 (ref 1.2–2.2)
Alkaline Phosphatase: 130 IU/L — ABNORMAL HIGH (ref 39–117)
BILIRUBIN TOTAL: 0.5 mg/dL (ref 0.0–1.2)
BUN/Creatinine Ratio: 14 (ref 9–23)
BUN: 12 mg/dL (ref 6–24)
CALCIUM: 9.8 mg/dL (ref 8.7–10.2)
CHLORIDE: 96 mmol/L (ref 96–106)
CO2: 21 mmol/L (ref 18–29)
CREATININE: 0.86 mg/dL (ref 0.57–1.00)
GFR, EST AFRICAN AMERICAN: 86 mL/min/{1.73_m2} (ref >59)
GFR, EST NON AFRICAN AMERICAN: 74 mL/min/{1.73_m2} (ref >59)
Globulin, Total: 2.8 g/dL (ref 1.5–4.5)
Glucose: 106 mg/dL — ABNORMAL HIGH (ref 65–99)
Potassium: 3.8 mmol/L (ref 3.5–5.2)
Sodium: 140 mmol/L (ref 134–144)
TOTAL PROTEIN: 7.3 g/dL (ref 6.0–8.5)

## 2015-12-19 NOTE — Assessment & Plan Note (Signed)
BP Readings from Last 3 Encounters:  12/17/15 145/67  09/21/15 122/46  04/04/15 138/77    Lab Results  Component Value Date   NA 140 12/17/2015   K 3.8 12/17/2015   CREATININE 0.86 12/17/2015    Assessment: Blood pressure control:  above goal (less than 140/90) Comments: Blood pressure was under control during the previous 2 visits, however, it is elevated at this visit. Patient is currently on hydrochlorothiazide 25 mg daily.  Plan: Medications:  continue current medications Educational resources provided:  educated patient about healthy eating and exercise Other plans:  -F/u visit in 1 month  -If blood pressure continues to be elevated, consider increasing dose of hydrochlorothiazide or adding a calcium channel blocker to her regimen

## 2015-12-19 NOTE — Progress Notes (Signed)
Internal Medicine Clinic Attending  I saw and evaluated the patient.  I personally confirmed the key portions of the history and exam documented by Dr. Marlowe Sax and I reviewed pertinent patient test results.  The assessment, diagnosis, and plan were formulated together and I agree with the documentation in the resident's note. Misty Decker was positive on right side, patient did have improvement of veritgo sensation following Eply maneuver suggesting likely BPPV as the cause, we printed home exercises for her to try.

## 2015-12-19 NOTE — Assessment & Plan Note (Signed)
HPI Patient continues to complain of dizziness that lasts a few seconds and occurs at least once a day. States it occurs whenever she moves her head and she also experiences pressure around her head. However, she does have a history of cervicogenic headaches. Denies any recent falls or loss of consciousness. Denies having any associated chest pain, palpitations, shortness breath, or diaphoresis. During her previous visit orthostatic vitals were checked and were negative and the BMET was reassuring. CT of head was ordered during previous visit and did not show any acute abnormality. The dose of her gabapentin was decreased. Patient states she is now taking gabapentin 400 mg 3 times a day and continues to use cyclobenzaprine 5 mg at bedtime only.  A Since her dizziness is associated with moving her head, BPPV is on the differential. Epley maneuver was performed during this visit - patient reported experiencing dizziness when her head was rotated to the right. Patient appeared anxious at this visit, and as such, anxiety could also be playing a role; she has a history of depression and anxiety.  P -Started patient on Zoloft (25 mg daily for 1 week, then 50 mg daily) which would address both her depression and anxiety -Continue lower dose of gabapentin and once daily cyclobenzaprine -Consider doing Epley maneuver again at future visits if she continues to have dizziness

## 2015-12-19 NOTE — Assessment & Plan Note (Addendum)
A Patient denies having any jaundice, pruritus, nausea, vomiting, or abdominal pain. Right upper quadrant ultrasound done in November 2016 showed fatty infiltration of liver, tiny amount of sludge, and common bile duct mildly prominent at 7.3 mm. No prominent intrahepatic biliary ductal dilation. LFTs ordered at this visit showing AST 159, ALT 158, and alkaline phosphatase 130. Labs from 03/2015 showing AST 157, ALT 173, and ALP 124.  P -Counseled patient on diet and exercise. Emphasized the importance of weight loss.

## 2015-12-19 NOTE — Progress Notes (Signed)
Patient ID: [name], female   DOB: [birth year], 60 y.o.   MRN: [id number]   CC: Dizziness  HPI:  [name] is a 60 y.o. female with a past medical history of conditions listed below presenting to the clinic to discuss her chronic medical conditions including dizziness, hypertension, and non-alcoholic fatty liver disease.  Please see assessment and plan for the status of the patient's chronic medical conditions  Past Medical History  Diagnosis Date  . Sciatica   . Depression   . Fibromyalgia   . Encounter for preventive health examination     Next mammogram in 07/2009. Next colonoscopy in 09/2017. DEXA 4/09>Normal  . Encounter for screening colonoscopy 10/03/2007    Normal  . Hyperlipidemia   . Hypertension   . Degenerative joint disease     s/p cervical and lumbar fusions    Review of Systems: All review of systems negative except as per history of present illness.  Physical Exam:  Filed Vitals:   12/17/15 1352 12/17/15 1353  BP:  145/67  Pulse:  86  Temp: 97.5 F (36.4 C)   TempSrc: Oral   Height: '5\' 3"'$  (1.6 m)   Weight: 235 lb 6.4 oz (106.777 kg)   SpO2:  97%   Physical Exam  Constitutional: She is oriented to person, place, and time. She appears well-developed and well-nourished.  HENT:  Head: Normocephalic and atraumatic.  Mouth/Throat: Oropharynx is clear and moist.  Eyes: EOM are normal. Pupils are equal, round, and reactive to light.  Neck: Neck supple. No tracheal deviation present.  Cardiovascular: Normal rate, regular rhythm and intact distal pulses.  Exam reveals no gallop and no friction rub.   No murmur heard. Pulmonary/Chest: Effort normal and breath sounds normal. No respiratory distress. She has no wheezes. She has no rales.  Abdominal: Soft. Bowel sounds are normal. She exhibits no distension. There is no tenderness. There is no guarding.  Musculoskeletal: She exhibits no edema.  Neurological: She is alert and oriented to person,  place, and time.  Skin: Skin is warm and dry.  Psychiatric:  Anxious, pressured speech    Assessment & Plan:   See encounters tab for problem based medical decision making.   Patient was seen with Dr. Angelia Mould

## 2015-12-19 NOTE — Assessment & Plan Note (Signed)
A Patient denies having any heart palpitations at this visit. During her previous visit she was given a referral for ambulatory cardiac monitoring and states she has not gone because she is not able to afford it.   P -Please refer to the note on dizziness and giddiness

## 2015-12-20 ENCOUNTER — Other Ambulatory Visit: Payer: Self-pay | Admitting: Internal Medicine

## 2015-12-20 NOTE — Telephone Encounter (Signed)
Last appt 12/17/15.  F/U appt 01/21/16.

## 2016-01-08 ENCOUNTER — Other Ambulatory Visit: Payer: Self-pay | Admitting: Internal Medicine

## 2016-01-08 DIAGNOSIS — R51 Headache: Principal | ICD-10-CM

## 2016-01-08 DIAGNOSIS — G4486 Cervicogenic headache: Secondary | ICD-10-CM

## 2016-01-08 NOTE — Telephone Encounter (Signed)
Last appt 12/17/15 Next appt 01/21/16.

## 2016-01-20 ENCOUNTER — Other Ambulatory Visit: Payer: Self-pay | Admitting: Internal Medicine

## 2016-01-21 ENCOUNTER — Encounter: Payer: Self-pay | Admitting: Internal Medicine

## 2016-01-21 ENCOUNTER — Encounter (INDEPENDENT_AMBULATORY_CARE_PROVIDER_SITE_OTHER): Payer: Self-pay

## 2016-01-21 ENCOUNTER — Ambulatory Visit (INDEPENDENT_AMBULATORY_CARE_PROVIDER_SITE_OTHER): Payer: Medicare HMO | Admitting: Internal Medicine

## 2016-01-21 VITALS — BP 137/71 | HR 76 | Temp 97.7°F | Ht 63.0 in | Wt 231.4 lb

## 2016-01-21 DIAGNOSIS — K219 Gastro-esophageal reflux disease without esophagitis: Secondary | ICD-10-CM

## 2016-01-21 DIAGNOSIS — D582 Other hemoglobinopathies: Secondary | ICD-10-CM

## 2016-01-21 DIAGNOSIS — G4486 Cervicogenic headache: Secondary | ICD-10-CM

## 2016-01-21 DIAGNOSIS — Z23 Encounter for immunization: Secondary | ICD-10-CM

## 2016-01-21 DIAGNOSIS — Z87891 Personal history of nicotine dependence: Secondary | ICD-10-CM

## 2016-01-21 DIAGNOSIS — R51 Headache: Secondary | ICD-10-CM | POA: Diagnosis not present

## 2016-01-21 DIAGNOSIS — R002 Palpitations: Secondary | ICD-10-CM

## 2016-01-21 DIAGNOSIS — I10 Essential (primary) hypertension: Secondary | ICD-10-CM

## 2016-01-21 DIAGNOSIS — K760 Fatty (change of) liver, not elsewhere classified: Secondary | ICD-10-CM

## 2016-01-21 DIAGNOSIS — F341 Dysthymic disorder: Secondary | ICD-10-CM

## 2016-01-21 DIAGNOSIS — F418 Other specified anxiety disorders: Secondary | ICD-10-CM

## 2016-01-21 NOTE — Patient Instructions (Signed)
I have given you a referral for a cardiac monitor. Our office will set it up for you.   Return for a follow-up visit in 1 month.

## 2016-01-21 NOTE — Progress Notes (Signed)
   CC: Patient is here to chronic medical problems including HTN, GERD, NAFLD, dizziness, and palpitations.   HPI:  [name] is a 60 y.o. F with a PMHx of conditions listed below presenting to the clinic to discuss her HTN, GERD, NAFLD, dizziness, and palpitations. Please see assessment and plan for the status of the patient's chronic medical conditions.   Past Medical History:  Diagnosis Date  . Degenerative joint disease    s/p cervical and lumbar fusions  . Depression   . Encounter for preventive health examination    Next mammogram in 07/2009. Next colonoscopy in 09/2017. DEXA 4/09>Normal  . Encounter for screening colonoscopy 10/03/2007   Normal  . Fibromyalgia   . Hyperlipidemia   . Hypertension   . Sciatica     Review of Systems:  Pertinent positives mentioned in HPI. Remainder of all ROS negative.  Physical Exam:  There were no vitals filed for this visit. Physical Exam  Constitutional: She is oriented to person, place, and time. She appears well-developed and well-nourished. No distress.  HENT:  Head: Normocephalic and atraumatic.  Eyes: EOM are normal. Pupils are equal, round, and reactive to light.  Neck: Neck supple. No tracheal deviation present.  Cardiovascular: Normal rate, regular rhythm and intact distal pulses.   Pulmonary/Chest: Effort normal and breath sounds normal. No respiratory distress. She has no wheezes. She has no rales.  Abdominal: Soft. Bowel sounds are normal. She exhibits no distension. There is no tenderness. There is no guarding.  Musculoskeletal: She exhibits no edema or deformity.  Neurological: She is alert and oriented to person, place, and time.  Skin: Skin is warm and dry.    Assessment & Plan:   See Encounters Tab for problem based charting.  Patient discussed with Dr. Eppie Gibson

## 2016-01-21 NOTE — Progress Notes (Signed)
Attempted to call [facility] to schedule Holter monitor. Stayed on hold for 6 minutes. Will try again later.

## 2016-01-22 ENCOUNTER — Telehealth: Payer: Self-pay | Admitting: *Deleted

## 2016-01-22 NOTE — Assessment & Plan Note (Addendum)
A Reports having a history of heart palpitations since childhood. She is not sure how often and when these episodes happen. Denies having any associated CP or dizziness. She was previously given a referral for ambulatory cardiac monitoring but patient has not gone for the study yet. However, she is now interested in going.  P -Refer to cardiology again  Addendum 03/09/16: 30 day event monitor did not show any arrhythmias or pauses.

## 2016-01-22 NOTE — Assessment & Plan Note (Signed)
A Patient states her dizziness and headaches have now resolved since she was started on Zoloft and dose of neurontin and flexeril were decreased. In addition, epley maneuver was conducted during previous visit.   P -Continue current mgmt and continue to monitor

## 2016-01-22 NOTE — Assessment & Plan Note (Signed)
A Former smoker (quit in 2004). Has history of H/H >15/45. CBC ordered during previous visit was not done. Patient appears stable - not having headaches and is not hypoxic (SpO2 95% on RA).  P -CTM or now. If Hgb >16 or patient becomes hypoxic, pursue further workup.

## 2016-01-22 NOTE — Telephone Encounter (Signed)
Spoke with Misty Decker who is going to contact patient to schedule, but she needs a new order stating where you would like the patient to go. Typically it is [facility] on [address]. Thanks!

## 2016-01-22 NOTE — Telephone Encounter (Signed)
Left voicemail with Davy Pique at [facility] with patient information and order. Secretary said that Davy Pique would contact patient to arrange.

## 2016-01-22 NOTE — Assessment & Plan Note (Signed)
BP Readings from Last 3 Encounters:  01/21/16 137/71  12/17/15 (!) 145/67  09/21/15 (!) 122/46    Lab Results  Component Value Date   NA 140 12/17/2015   K 3.8 12/17/2015   CREATININE 0.86 12/17/2015    Assessment: Blood pressure control:  well controlled  Comments: Currently on HCTZ 25 mg daily.   Plan: Medications:  continue current medications Educational resources provided:   Educated patient about healthy eating and exercise. Emphasized the importance of weight loss.

## 2016-01-22 NOTE — Assessment & Plan Note (Signed)
A Currently taking Zoloft 50 mg daily. Patient states her mood is good. She was accompanied by her husband and both seemed excited to leave for a vacation to the beach this week.   P -Continue current mgmt

## 2016-01-22 NOTE — Assessment & Plan Note (Signed)
A Stable; does not have any complaints. Currently on Omeprazole 20 mg daily.  P -Continue current mgmt

## 2016-01-22 NOTE — Assessment & Plan Note (Signed)
A LFTs checked a month ago were stable. Patient denies having any nausea, vomiting, abdominal pain, or jaundice. Reports having occasional pruritis which has now resolved since she started using soap/ lotion for sensitive skin.   P -Educated patient about healthy eating and exercise. Emphasized the importance of weight loss.  -PPSV 23 at this visit

## 2016-01-23 NOTE — Progress Notes (Signed)
Case discussed with Dr. Marlowe Sax at the time of the visit.  We reviewed the resident's history and exam and pertinent patient test results.  I agree with the assessment, diagnosis and plan of care documented in the resident's note.

## 2016-01-28 DIAGNOSIS — M47816 Spondylosis without myelopathy or radiculopathy, lumbar region: Secondary | ICD-10-CM | POA: Diagnosis not present

## 2016-01-28 DIAGNOSIS — M4302 Spondylolysis, cervical region: Secondary | ICD-10-CM | POA: Diagnosis not present

## 2016-01-29 ENCOUNTER — Encounter (INDEPENDENT_AMBULATORY_CARE_PROVIDER_SITE_OTHER): Payer: Self-pay

## 2016-01-29 ENCOUNTER — Ambulatory Visit (INDEPENDENT_AMBULATORY_CARE_PROVIDER_SITE_OTHER): Payer: Medicare HMO

## 2016-01-29 ENCOUNTER — Other Ambulatory Visit: Payer: Self-pay | Admitting: Internal Medicine

## 2016-01-29 DIAGNOSIS — R42 Dizziness and giddiness: Secondary | ICD-10-CM

## 2016-01-29 DIAGNOSIS — R002 Palpitations: Secondary | ICD-10-CM

## 2016-02-11 ENCOUNTER — Other Ambulatory Visit: Payer: Self-pay | Admitting: *Deleted

## 2016-02-11 DIAGNOSIS — K219 Gastro-esophageal reflux disease without esophagitis: Secondary | ICD-10-CM

## 2016-02-11 MED ORDER — OMEPRAZOLE 20 MG PO CPDR: 20.0000 mg | DELAYED_RELEASE_CAPSULE | Freq: Every day | ORAL | 0 refills | Status: DC

## 2016-02-18 ENCOUNTER — Other Ambulatory Visit: Payer: Self-pay | Admitting: Internal Medicine

## 2016-03-10 ENCOUNTER — Telehealth: Payer: Self-pay | Admitting: Internal Medicine

## 2016-03-10 ENCOUNTER — Encounter: Payer: Self-pay | Admitting: Internal Medicine

## 2016-03-10 ENCOUNTER — Ambulatory Visit (INDEPENDENT_AMBULATORY_CARE_PROVIDER_SITE_OTHER): Payer: Medicare HMO | Admitting: Internal Medicine

## 2016-03-10 VITALS — BP 130/70 | HR 84 | Temp 98.3°F | Ht 63.0 in | Wt 227.2 lb

## 2016-03-10 DIAGNOSIS — Z9889 Other specified postprocedural states: Secondary | ICD-10-CM

## 2016-03-10 DIAGNOSIS — Z79899 Other long term (current) drug therapy: Secondary | ICD-10-CM

## 2016-03-10 DIAGNOSIS — I10 Essential (primary) hypertension: Secondary | ICD-10-CM

## 2016-03-10 DIAGNOSIS — K219 Gastro-esophageal reflux disease without esophagitis: Secondary | ICD-10-CM

## 2016-03-10 DIAGNOSIS — G8928 Other chronic postprocedural pain: Secondary | ICD-10-CM | POA: Diagnosis not present

## 2016-03-10 DIAGNOSIS — Z23 Encounter for immunization: Secondary | ICD-10-CM

## 2016-03-10 DIAGNOSIS — M542 Cervicalgia: Secondary | ICD-10-CM

## 2016-03-10 DIAGNOSIS — Z87891 Personal history of nicotine dependence: Secondary | ICD-10-CM

## 2016-03-10 DIAGNOSIS — Z981 Arthrodesis status: Secondary | ICD-10-CM

## 2016-03-10 DIAGNOSIS — Z0000 Encounter for general adult medical examination without abnormal findings: Secondary | ICD-10-CM

## 2016-03-10 MED ORDER — GABAPENTIN 600 MG PO TABS: 600.0000 mg | ORAL_TABLET | Freq: Three times a day (TID) | ORAL | 2 refills | Status: DC

## 2016-03-10 MED ORDER — CYCLOBENZAPRINE HCL 5 MG PO TABS: ORAL_TABLET | ORAL | 0 refills | Status: DC

## 2016-03-10 NOTE — Patient Instructions (Signed)
Misty Decker it was nice seeing you today.   -Dose of Gabapentin has been increased: take 600 mg by mouth three times a day  -Continue Flexeril as needed

## 2016-03-10 NOTE — Telephone Encounter (Signed)
APT. REMINDER CALL, LMTCB °

## 2016-03-11 LAB — HIV ANTIBODY (ROUTINE TESTING W REFLEX): HIV Screen 4th Generation wRfx: NONREACTIVE

## 2016-03-11 NOTE — Assessment & Plan Note (Signed)
HIV screen negative. Patient received influenza vaccine at this visit.

## 2016-03-11 NOTE — Assessment & Plan Note (Signed)
BP Readings from Last 3 Encounters:  03/10/16 130/70  01/21/16 137/71  12/17/15 (!) 145/67    Lab Results  Component Value Date   NA 140 12/17/2015   K 3.8 12/17/2015   CREATININE 0.86 12/17/2015    Assessment: Blood pressure control:  below goal (less than 140/90) Progress toward BP goal:   improved Comments: Patient is currently taking hydrochlorothiazide 25 mg daily.  Plan: Medications:  Continue current management Educational resources provided:   Educated patient about healthy eating and exercise. Emphasized the importance of weight loss.

## 2016-03-11 NOTE — Assessment & Plan Note (Signed)
Assessment Symptoms are well controlled with omeprazole 20 mg daily and Tums as needed.  Plan -Continue current management

## 2016-03-11 NOTE — Assessment & Plan Note (Signed)
Assessment Patient currently takes gabapentin 400 mg 3 times a day and Flexeril 5 mg at bedtime as needed for neck pain which is secondary to history of cervical vertebra fusion surgeries in the past. Dose of gabapentin used to be 600 mg 3 times a day in the past and was reduced during a previous visit as patient was complaining of dizziness at that time. Her dizziness has not resolved and patient is requesting a higher dose of gabapentin because this dose is not adequately controlling her neck pain.  Plan -Increase gabapentin to 600 mg 3 times a day -Continue Flexeril 5 mg at bedtime as needed -Reassess symptoms at next visit

## 2016-03-11 NOTE — Progress Notes (Signed)
   CC: Patient is here to request a higher dose of her current medication for neck pain.   HPI:  [name] is a 60 y.o. female with a past medical history of conditions listed below presenting to the clinic requesting a higher dose of gabapentin for her neck pain which is secondary to history of cervical vertebra fusion surgeries. Her other medical conditions including GERD and hypertension were also discussed during this visit. Please see problem based charting for the status of the patient's chronic medical conditions.   Past Medical History:  Diagnosis Date  . Degenerative joint disease    s/p cervical and lumbar fusions  . Depression   . Encounter for preventive health examination    Next mammogram in 07/2009. Next colonoscopy in 09/2017. DEXA 4/09>Normal  . Encounter for screening colonoscopy 10/03/2007   Normal  . Fibromyalgia   . Hyperlipidemia   . Hypertension   . Sciatica     Review of Systems:  Pertinent positives mentioned in HPI. Remainder of all ROS negative.   Physical Exam:  Vitals:   03/10/16 1455  BP: 130/70  Pulse: 84  Temp: 98.3 F (36.8 C)  TempSrc: Oral  SpO2: 97%  Weight: 227 lb 3.2 oz (103.1 kg)  Height: '5\' 3"'$  (1.6 m)   Physical Exam  Constitutional: She is oriented to person, place, and time. She appears well-developed and well-nourished. No distress.  HENT:  Head: Normocephalic and atraumatic.  Eyes: EOM are normal.  Neck: Normal range of motion.  Cardiovascular: Normal rate, regular rhythm and intact distal pulses.   Pulmonary/Chest: Effort normal and breath sounds normal. No respiratory distress.  Abdominal: Soft. Bowel sounds are normal. She exhibits no distension. There is no tenderness. There is no guarding.  Musculoskeletal: She exhibits no edema or tenderness.  Neurological: She is alert and oriented to person, place, and time.  Skin: Skin is warm and dry.    Assessment & Plan:   See Encounters Tab for problem based  chartin2g.  Patient discussed with Dr. Lynnae January

## 2016-03-13 NOTE — Progress Notes (Signed)
Internal Medicine Clinic Attending  Case discussed with Dr. Rathoreat the time of the visit. We reviewed the resident's history and exam and pertinent patient test results. I agree with the assessment, diagnosis, and plan of care documented in the resident's note.  

## 2016-03-20 ENCOUNTER — Other Ambulatory Visit: Payer: Self-pay | Admitting: Internal Medicine

## 2016-04-18 DIAGNOSIS — E7800 Pure hypercholesterolemia, unspecified: Secondary | ICD-10-CM | POA: Diagnosis not present

## 2016-04-18 DIAGNOSIS — K219 Gastro-esophageal reflux disease without esophagitis: Secondary | ICD-10-CM | POA: Diagnosis not present

## 2016-04-18 DIAGNOSIS — Z0000 Encounter for general adult medical examination without abnormal findings: Secondary | ICD-10-CM | POA: Diagnosis not present

## 2016-04-18 DIAGNOSIS — I129 Hypertensive chronic kidney disease with stage 1 through stage 4 chronic kidney disease, or unspecified chronic kidney disease: Secondary | ICD-10-CM | POA: Diagnosis not present

## 2016-04-18 DIAGNOSIS — R69 Illness, unspecified: Secondary | ICD-10-CM | POA: Diagnosis not present

## 2016-04-20 ENCOUNTER — Other Ambulatory Visit: Payer: Self-pay | Admitting: Internal Medicine

## 2016-05-11 ENCOUNTER — Other Ambulatory Visit: Payer: Self-pay | Admitting: Internal Medicine

## 2016-05-11 DIAGNOSIS — K219 Gastro-esophageal reflux disease without esophagitis: Secondary | ICD-10-CM

## 2016-05-27 ENCOUNTER — Other Ambulatory Visit: Payer: Self-pay | Admitting: Internal Medicine

## 2016-06-09 ENCOUNTER — Telehealth: Payer: Self-pay

## 2016-06-09 NOTE — Telephone Encounter (Signed)
Pt states Gabapentin is now Tier 3 from tier 2 and will cost $47.00. And cannot afford b/c she and her husband are on same medication. Said the pharmacy told her to call his doctor. ?Prior Authorization. Will send message to Ulis Rias and Mannie Stabile also.

## 2016-06-09 NOTE — Telephone Encounter (Signed)
Needs to speak with a nurse regarding med.

## 2016-06-10 NOTE — Telephone Encounter (Signed)
Spoke to her pharmacist. He informed me this medication is covered by her insurance and does not require a prior authorization. The cost is 42.31 for a 30 day supply. Switching her to Lyrica would not a be a good option because it is more expensive. The pharmacist informed me that the patient needs to call her insurance company to discuss the cost further. She has medicare part D. Thanks.

## 2016-06-11 ENCOUNTER — Telehealth: Payer: Self-pay | Admitting: Pharmacist

## 2016-06-11 DIAGNOSIS — M797 Fibromyalgia: Secondary | ICD-10-CM

## 2016-06-11 MED ORDER — DULOXETINE HCL 30 MG PO CPEP: 30.0000 mg | ORAL_CAPSULE | Freq: Every day | ORAL | 3 refills | Status: DC

## 2016-06-11 NOTE — Telephone Encounter (Signed)
Called pt to inform her of Dr Elon Jester respond of Gabapentin cost of $42.31; switching to Lyrica is not an option d/t cost and calling her insurance co. Stated she had already called/talked to them.

## 2016-06-11 NOTE — Progress Notes (Signed)
Spoke to pharmacist and patient and explained to patient the gabapentin cost is high due to an insurance deductible. Once deductible is met, price should come down. She will need to contact insurance for further clarification and specifics. Contact clinic if further questions or concerns. Patient verbalized understanding.

## 2016-06-11 NOTE — Telephone Encounter (Signed)
I was able to resolve: Spoke to pharmacist and patient and explained to patient the gabapentin cost is high due to an insurance deductible. Once deductible is met, price should come down. She will need to contact insurance for further clarification and specifics. Contact clinic if further questions or concerns. Patient verbalized understanding.

## 2016-06-12 NOTE — Telephone Encounter (Signed)
Thanks Glenda and Dr. Maudie Mercury. I really appreciate your help.

## 2016-06-16 DIAGNOSIS — M4302 Spondylolysis, cervical region: Secondary | ICD-10-CM | POA: Diagnosis not present

## 2016-06-16 DIAGNOSIS — M47816 Spondylosis without myelopathy or radiculopathy, lumbar region: Secondary | ICD-10-CM | POA: Diagnosis not present

## 2016-06-23 ENCOUNTER — Other Ambulatory Visit: Payer: Self-pay | Admitting: Internal Medicine

## 2016-07-03 ENCOUNTER — Other Ambulatory Visit: Payer: Self-pay | Admitting: Internal Medicine

## 2016-07-03 DIAGNOSIS — Z1231 Encounter for screening mammogram for malignant neoplasm of breast: Secondary | ICD-10-CM

## 2016-08-10 ENCOUNTER — Other Ambulatory Visit: Payer: Self-pay | Admitting: Internal Medicine

## 2016-08-10 DIAGNOSIS — K219 Gastro-esophageal reflux disease without esophagitis: Secondary | ICD-10-CM

## 2016-08-17 ENCOUNTER — Ambulatory Visit: Payer: Medicare HMO

## 2016-09-04 ENCOUNTER — Ambulatory Visit
Admission: RE | Admit: 2016-09-04 | Discharge: 2016-09-04 | Disposition: A | Discharge status: Home / Self Care | Payer: Medicare HMO | Source: Ambulatory Visit | Attending: Family Medicine | Admitting: Family Medicine

## 2016-09-04 DIAGNOSIS — Z1231 Encounter for screening mammogram for malignant neoplasm of breast: Secondary | ICD-10-CM

## 2016-09-21 ENCOUNTER — Other Ambulatory Visit: Payer: Self-pay | Admitting: Internal Medicine

## 2016-09-22 ENCOUNTER — Telehealth: Payer: Self-pay | Admitting: *Deleted

## 2016-09-22 NOTE — Telephone Encounter (Signed)
Pt has May appt with new provider / clinic. Will fill but provide or refills,.

## 2016-09-22 NOTE — Telephone Encounter (Signed)
Pt calling nurse back

## 2016-09-22 NOTE — Telephone Encounter (Signed)
Spoke w/ pt she states her and spouse are going to try going to a [facility] office closer to home due to spouse getting worse. She does not want to leave [facility] but needs to see if this will be less stressful on spouse as he can get quite angry and impatient now about some things. If it doesn't work well then she states they will come back because she knows the care here is the best. I thanked her and told her we understood and that [facility] would fill their meds for 30 days, she was agreeable

## 2016-10-26 ENCOUNTER — Ambulatory Visit (INDEPENDENT_AMBULATORY_CARE_PROVIDER_SITE_OTHER): Payer: Medicare HMO | Admitting: Family Medicine

## 2016-10-26 ENCOUNTER — Encounter: Payer: Self-pay | Admitting: Family Medicine

## 2016-10-26 VITALS — BP 133/73 | HR 84 | Ht 63.0 in | Wt 220.5 lb

## 2016-10-26 DIAGNOSIS — I10 Essential (primary) hypertension: Secondary | ICD-10-CM | POA: Diagnosis not present

## 2016-10-26 DIAGNOSIS — R739 Hyperglycemia, unspecified: Secondary | ICD-10-CM | POA: Diagnosis not present

## 2016-10-26 DIAGNOSIS — K760 Fatty (change of) liver, not elsewhere classified: Secondary | ICD-10-CM

## 2016-10-26 DIAGNOSIS — R7303 Prediabetes: Secondary | ICD-10-CM | POA: Insufficient documentation

## 2016-10-26 DIAGNOSIS — E785 Hyperlipidemia, unspecified: Secondary | ICD-10-CM

## 2016-10-26 DIAGNOSIS — Z833 Family history of diabetes mellitus: Secondary | ICD-10-CM

## 2016-10-26 DIAGNOSIS — F341 Dysthymic disorder: Secondary | ICD-10-CM

## 2016-10-26 DIAGNOSIS — R69 Illness, unspecified: Secondary | ICD-10-CM | POA: Diagnosis not present

## 2016-10-26 DIAGNOSIS — Z789 Other specified health status: Secondary | ICD-10-CM

## 2016-10-26 DIAGNOSIS — K259 Gastric ulcer, unspecified as acute or chronic, without hemorrhage or perforation: Secondary | ICD-10-CM

## 2016-10-26 DIAGNOSIS — IMO0001 Reserved for inherently not codable concepts without codable children: Secondary | ICD-10-CM

## 2016-10-26 DIAGNOSIS — E669 Obesity, unspecified: Secondary | ICD-10-CM | POA: Diagnosis not present

## 2016-10-26 NOTE — Assessment & Plan Note (Signed)
Asked pt when last GYn exam was--> said not for 3-4 yrs.  Only 1 person- husband and no h/o abn PAP and h/o HYST

## 2016-10-26 NOTE — Assessment & Plan Note (Signed)
Will obtain records from pt's GI doc.  Per pt, has been worked up for elevated LFT's- unknown etiology.  ABD [place] results from recent past noted

## 2016-10-26 NOTE — Assessment & Plan Note (Addendum)
We'll obtain fasting lipid profile.  We will work on intensive lifestyle modifications

## 2016-10-26 NOTE — Assessment & Plan Note (Addendum)
Bp well controlled  Cont meds  Lifestyle mod d/c pt

## 2016-10-26 NOTE — Assessment & Plan Note (Signed)
Screen for DM near future as she has h/o elevated FBS

## 2016-10-26 NOTE — Assessment & Plan Note (Signed)
Patient understands what types of medicines are NSAIDs and what she needs to stay away from.

## 2016-10-26 NOTE — Assessment & Plan Note (Addendum)
Cont sertraline- current dose; denies need for dose change  Pt declines counseling due to cost;  I recommend free support groups for caregivers

## 2016-10-26 NOTE — Progress Notes (Signed)
New patient office visit note:  Impression and Recommendations:    1. Essential hypertension   2. Hyperlipidemia, unspecified hyperlipidemia type   3. Family history of diabetes mellitus (DM)- Mom and sister   68. Elevated blood sugar level   5. ANXIETY DEPRESSION   6. Nonalcoholic fatty liver disease- elevated LFT's   7. Obesity, Class II, BMI 35-39.9, with comorbidity   8. Statin intolerance   9. Gastric erosion, unspecified chronicity      Hyperlipidemia Obtain fasting labs prior to next office visit.  Handouts for weight loss and prudent diet were given and discussed with patient.  She knows to stay away from saturated and Transfats as well as sweets.  AHA exercise guidelines d/c pt  ANXIETY DEPRESSION Cont sertraline- current dose; denies need for dose change  Pt declines counseling due to cost;  I recommend free support groups for caregivers  Essential hypertension Bp well controlled  Cont meds  Lifestyle mod d/c pt  Statin intolerance- elevated LFT's and severe myalgias We'll obtain fasting lipid profile.  We will work on intensive lifestyle modifications  Gastric erosions--- NSAID-related erosions Patient understands what types of medicines are NSAIDs and what she needs to stay away from.  Family history of diabetes mellitus (DM)- Mom and sister Screen for DM near future as she has h/o elevated FBS  HYSTERECTOMY, TOTAL, HX OF Asked pt when last GYn exam was--> said not for 3-4 yrs.  Only 1 person- husband and no h/o abn PAP and h/o HYST  Nonalcoholic fatty liver disease- elevated LFT's Will obtain records from pt's GI doc.  Per pt, has been worked up for elevated LFT's- unknown etiology.  ABD [place] results from recent past noted  Obesity, Class II, BMI 35-39.9, with comorbidity Rec Weight [employer]chers  RTC if she wishes to work on diet/ start regimen of tracking foods, creating goals etc.    The patient was counseled, risk factors were discussed,  anticipatory guidance given.   Meds ordered this encounter  Medications  . B Complex-Biotin-FA (SUPER B-100 PO)    Sig: Take 1 tablet by mouth daily.  . APPLE CIDER VINEGAR PO    Sig: Take 2 tablets by mouth daily.  . Magnesium 125 MG CAPS    Sig: Take 0.5 capsules by mouth daily.     Discontinued Medications   GABAPENTIN (NEURONTIN) 600 MG TABLET    TAKE ONE TABLET BY MOUTH THREE TIMES DAILY    Gross side effects, risk and benefits, and alternatives of medications discussed with patient.  Patient is aware that all medications have potential side effects and we are unable to predict every side effect or drug-drug interaction that may occur.  Expresses verbal understanding and consents to current therapy plan and treatment regimen.  Return in about 4 months (around 02/26/2017) for f/up fasting labs - h/o elevated LFT's, HLD, FBS elevated.  Please see AVS handed out to patient at the end of our visit for further patient instructions/ counseling done pertaining to today's office visit.    Note: This document was prepared using Dragon voice recognition software and may include unintentional dictation errors.  ----------------------------------------------------------------------------------------------------------------------    Subjective:    Chief complaint:   Chief Complaint  Patient presents with  . Establish Care     HPI: Misty Decker is a pleasant 61 y.o. female who presents to [place] at [facility] today to review their medical history with me and establish care.   I asked  the patient to review their chronic problem list with me to ensure everything was updated and accurate.    All recent office visits with other providers, any medical records that patient brought in etc  - I reviewed today.     Also asked pt to get me medical records from [facility] providers/ specialists that they had seen within the past 3-5 years- if they are in private practice  and/or do not work for a [employer], [facility], [facility] or [employer] owned practice.  Told them to call their specialists to clarify this if they are not sure.    --> pt does not work and is "disabled".    1. HTN HPI:  -  dx with it roughly 2006.  Doesn't check it at home.  Exercise - walking 48mn 4-5 d/wk   - Patient reports good compliance with blood pressure medications  - Denies medication S-E   - Smoking Status noted   - She denies new onset of: chest pain, exercise intolerance, shortness of breath, dizziness, visual changes, headache, lower extremity swelling or claudication.   Today their BP is BP: 133/73   Last 3 blood pressure readings in our office are as follows: BP Readings from Last 3 Encounters:  10/26/16 133/73  03/10/16 130/70  01/21/16 137/71     Filed Weights   10/26/16 1034  Weight: 220 lb 8 oz (100 kg)      --> HLD-  dx chol prob yrs ago.  Can't tolerate statins b/c of fatty liver, stiff as a boars/ muscle aches etc.  Eats chicken and beans mostly-- got rid red meats   CHOL HPI:  - unable to tol statins -  She  is currently managed with:  See med list from today  Last lipid panel as follows:  Lab Results  Component Value Date   CHOL 146 02/06/2014   HDL 36 (L) 02/06/2014   LDLCALC 54 02/06/2014   TRIG 280 (H) 02/06/2014   CHOLHDL 4.1 02/06/2014    Hepatic Function Latest Ref Rng & Units 12/17/2015 04/04/2015 01/16/2015  Total Protein 6.0 - 8.5 g/dL 7.3 7.5 7.2  Albumin 3.5 - 5.5 g/dL 4.5 4.6 4.6  AST 0 - 40 IU/L 159(H) 157(H) 178(H)  ALT 0 - 32 IU/L 158(H) 173(H) 176(H)  Alk Phosphatase 39 - 117 IU/L 130(H) 124(H) 118(H)  Total Bilirubin 0.0 - 1.2 mg/dL 0.5 0.7 0.6  Bilirubin, Direct 0.00 - 0.40 mg/dL - - 0.20       --> GAD-  Been on meds due to excessive worry about husband.   zoloft- been on for about 1 yr now; on it for sleep/ anxiousness.       ( was on elavil prior).    No Counselor or support group    --> Sleep:  Used  ambien in past--> no help.   Elavil tried as well.  Zoloft helps her sleep.     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     GAD 7 : Generalized Anxiety Score 10/26/2016  Nervous, Anxious, on Edge 1  Control/stop worrying 1  Worry too much - different things 1  Trouble relaxing 0  Restless 0  Easily annoyed or irritable 1  Afraid - awful might happen 1  Total GAD 7 Score 5  Anxiety Difficulty Somewhat  difficult     Depression screen [facility] 2/9 10/26/2016 03/10/2016 03/10/2016 01/21/2016 12/17/2015  Decreased Interest 0 0 0 0 0  Down, Depressed, Hopeless 1 0 0 0 0  PHQ - 2 Score 1 0 0 0 0  Altered sleeping 1 - - - -  Tired, decreased energy 1 - - - -  Change in appetite 0 - - - -  Feeling bad or failure about yourself  0 - - - -  Trouble concentrating 0 - - - -  Moving slowly or fidgety/restless 0 - - - -  Suicidal thoughts 0 - - - -  PHQ-9 Score 3 - - - -      Wt Readings from Last 3 Encounters:  10/26/16 220 lb 8 oz (100 kg)  03/10/16 227 lb 3.2 oz (103.1 kg)  01/21/16 231 lb 6.4 oz (105 kg)   BP Readings from Last 3 Encounters:  10/26/16 133/73  03/10/16 130/70  01/21/16 137/71   Pulse Readings from Last 3 Encounters:  10/26/16 84  03/10/16 84  01/21/16 76   BMI Readings from Last 3 Encounters:  10/26/16 39.06 kg/m  03/10/16 40.25 kg/m  01/21/16 40.99 kg/m    Patient Care Team    Relationship Specialty Notifications Start End  Mellody Dance, DO PCP - General Family Medicine  10/26/16    Roseanne Kaufman, MD Consulting Physician Orthopedic Surgery  10/26/16      Druscilla Brownie, MD Consulting Physician Dermatology  10/26/16      Drema [name], MD  - cervical injections, lower back  -txs for chronic M-Sk pain  Physical Medicine and Rehabilitation  10/26/16       Glenna Fellows, MD Attending  Physician Neurosurgery  10/26/16         Patient Active Problem List   Diagnosis Date Noted  . Gastric erosions--- NSAID-related erosions 10/26/2016    Priority: High  . Statin intolerance- elevated LFT's and severe myalgias 10/26/2016    Priority: High  . ANXIETY DEPRESSION 10/23/2009    Priority: High  . Hyperlipidemia 09/23/2006    Priority: High  . Essential hypertension 09/23/2006    Priority: High  . Family history of diabetes mellitus (DM)- Mom and sister 10/26/2016    Priority: Medium  . FIBROMYALGIA 05/16/2009    Priority: Medium  . HYSTERECTOMY, TOTAL, HX OF 09/23/2006    Priority: Medium  . Elevated blood sugar level 10/26/2016    Priority: Low  . Nonalcoholic fatty liver disease- elevated LFT's 02/27/2014    Priority: Low  . Gastroesophageal reflux disease without esophagitis 05/08/2013    Priority: Low  . DEGENERATIVE JOINT DISEASE 09/23/2006    Priority: Low  . Obesity, Class II, BMI 35-39.9, with comorbidity 10/26/2016  . Elevated hemoglobin ([place]) 01/17/2015  . Health care maintenance 04/26/2014  . Cervicogenic headache 04/25/2012  . History of spinal surgery 05/05/2011  . Back pain 05/05/2011     Past Medical History:  Diagnosis Date  . Degenerative joint disease    s/p cervical and lumbar fusions  . Depression   . Encounter for preventive health examination    Next mammogram in 07/2009. Next colonoscopy in 09/2017. DEXA 4/09>Normal  . Encounter for screening colonoscopy 10/03/2007   Normal  . Fibromyalgia   . Hyperlipidemia   . Hypertension   . Sciatica      Past Medical History:  Diagnosis Date  . Degenerative joint disease    s/p cervical and lumbar fusions  . Depression   . Encounter for  preventive health examination    Next mammogram in 07/2009. Next colonoscopy in 09/2017. DEXA 4/09>Normal  . Encounter for screening colonoscopy 10/03/2007   Normal  . Fibromyalgia   . Hyperlipidemia   . Hypertension   . Sciatica      Past  Surgical History:  Procedure Laterality Date  . CERVICAL FUSION     X 3  . LUMBAR FUSION     X 2  . pelvic prolapse    . RECTAL PROLAPSE REPAIR    . TONSILLECTOMY    . TOTAL ABDOMINAL HYSTERECTOMY       Family History  Problem Relation Age of Onset  . Cancer Mother   . Diabetes Mother   . Breast cancer Mother   . Heart attack Mother   . Hyperlipidemia Mother   . Hyperlipidemia Father   . Hypertension Father   . Aneurysm Father   . Dementia Maternal Grandfather   . Cancer Paternal Grandmother        intestinal     History  Drug Use No     History  Alcohol Use  . 0.0 oz/week    Comment: special occasions     History  Smoking Status  . Former Smoker  . Packs/day: 2.00  . Years: 30.00  . Types: Cigarettes  . Quit date: 04/03/2003  Smokeless Tobacco  . Never Used     Outpatient Encounter Prescriptions as of 10/26/2016  Medication Sig  . APPLE CIDER VINEGAR PO Take 2 tablets by mouth daily.  . B Complex-Biotin-FA (SUPER B-100 PO) Take 1 tablet by mouth daily.  . calcium-vitamin D (OSCAL) 250-125 MG-UNIT per tablet Take 1 tablet by mouth 3 (three) times daily.    . cyclobenzaprine (FLEXERIL) 5 MG tablet TAKE ONE TABLET BY MOUTH AT BEDTIME AS NEEDED FOR MUSCLE SPASM  . hydrochlorothiazide (HYDRODIURIL) 25 MG tablet Take 1 tablet (25 mg total) by mouth daily.  . Magnesium 125 MG CAPS Take 0.5 capsules by mouth daily.  . Multiple Vitamin (MULTIVITAMIN) capsule Take 1 capsule by mouth daily.    . Omega-3 Fatty Acids (FISH OIL) 300 MG CAPS Take 1 capsule by mouth daily.    Marland Kitchen omeprazole (PRILOSEC) 20 MG capsule TAKE ONE CAPSULE BY MOUTH ONCE DAILY  . sertraline (ZOLOFT) 50 MG tablet TAKE ONE TABLET BY MOUTH ONCE DAILY  . [DISCONTINUED] gabapentin (NEURONTIN) 600 MG tablet TAKE ONE TABLET BY MOUTH THREE TIMES DAILY   No facility-administered encounter medications on file as of 10/26/2016.     Allergies: Beef-derived products; Pork-derived products; Codeine;  Doxycycline; Latex; Morphine; Nsaids; Penicillins; and Tramadol   ROS   Objective:   Blood pressure 133/73, pulse 84, height '5\' 3"'$  (1.6 m), weight 220 lb 8 oz (100 kg). Body mass index is 39.06 kg/m. General: Well Developed, well nourished, and in no acute distress.  Neuro: Alert and oriented x3, extra-ocular muscles intact, sensation grossly intact.  HEENT:[place]/AT, PERRLA, neck supple, No carotid bruits Skin: no gross rashes  Cardiac: Regular rate and rhythm Respiratory: Essentially clear to auscultation bilaterally. Not using accessory muscles, speaking in full sentences.  Abdominal: not grossly distended Musculoskeletal: Ambulates w/o diff, FROM * 4 ext.  Vasc: less 2 sec cap RF, warm and pink  Psych:  No HI/SI, judgement and insight good, Euthymic mood. Full Affect.    No results found for this or any previous visit (from the past 2160 hour(s)).

## 2016-10-26 NOTE — Assessment & Plan Note (Signed)
Obtain fasting labs prior to next office visit.  Handouts for weight loss and prudent diet were given and discussed with patient.  She knows to stay away from saturated and Transfats as well as sweets.  AHA exercise guidelines d/c pt

## 2016-10-26 NOTE — Patient Instructions (Addendum)
[facility]    Guidelines for Losing Weight   We want weight loss that will last so you should lose 1-2 pounds a week.  THAT IS IT! Please pick THREE things a month to change. Once it is a habit check off the item. Then pick another three items off the list to become habits.  If you are already doing a habit on the list GREAT!  Cross that item off!  Don't drink your calories. Ie, alcohol, soda, fruit juice, and sweet tea.   Drink more water. Drink a glass when you feel hungry or before each meal.   Eat breakfast - Complex carb and protein (likeDannon light and fit yogurt, oatmeal, fruit, eggs, [place] bacon).  Measure your cereal.  Eat no more than one cup a day. (ie Kashi)  Eat an apple a day.  Add a vegetable a day.  Try a new vegetable a month.  Use Pam! Stop using oil or butter to cook.  Don't finish your plate or use smaller plates.  Share your dessert.  Eat sugar free Jello for dessert or frozen grapes.  Don't eat 2-3 hours before bed.  Switch to whole wheat bread, pasta, and brown rice.  Make healthier choices when you eat out. No fries!  Pick baked chicken, NOT fried.  Don't forget to SLOW DOWN when you eat. It is not going anywhere.   Take the stairs.  Park far away in the parking lot  Lift soup cans (or weights) for 10 minutes while watching TV.  Walk at work for 10 minutes during break.  Walk outside 1 time a week with your friend, kids, dog, or significant other.  Start a walking group at church.  Walk the mall as much as you can tolerate.   Keep a food diary.  Weigh yourself daily.  Walk for 15 minutes 3 days per week.  Cook at home more often and eat out less. If life happens and you go back to old habits, it is okay.  Just start over. You can do it!  If you experience chest pain, get short of breath, or tired during the exercise, please stop immediately and inform your doctor.    Before you even begin to attack a weight-loss  plan, it pays to remember this: You are not fat. You have fat. Losing weight isn't about blame or shame; it's simply another achievement to accomplish. Dieting is like any other skill-you have to buckle down and work at it. As long as you act in a smart, reasonable way, you'll ultimately get where you want to be. Here are some weight loss pearls for you.   1. It's Not a Diet. It's a Lifestyle Thinking of a diet as something you're on and suffering through only for the short term doesn't work. To shed weight and keep it off, you need to make permanent changes to the way you eat. It's OK to indulge occasionally, of course, but if you cut calories temporarily and then revert to your old way of eating, you'll gain back the weight quicker than you can say yo-yo. Use it to lose it. Research shows that one of the best predictors of long-term weight loss is how many pounds you drop in the first month. For that reason, nutritionists often suggest being stricter for the first two weeks of your new eating strategy to build momentum. Cut out added sugar and alcohol and avoid unrefined carbs. After that, figure out how you can reincorporate them  in a way that's healthy and maintainable.  2. There's a Right Way to Exercise Working out burns calories and fat and boosts your metabolism by building muscle. But those trying to lose weight are notorious for overestimating the number of calories they burn and underestimating the amount they take in. Unfortunately, your system is biologically programmed to hold on to extra pounds and that means when you start exercising, your body senses the deficit and ramps up its hunger signals. If you're not diligent, you'll eat everything you burn and then some. Use it, to lose it. Cardio gets all the exercise glory, but strength and interval training are the real heroes. They help you build lean muscle, which in turn increases your metabolism and calorie-burning ability 3. Don't Overreact  to Mild Hunger Some people have a hard time losing weight because of hunger anxiety. To them, being hungry is bad-something to be avoided at all costs-so they carry snacks with them and eat when they don't need to. Others eat because they're stressed out or bored. While you never want to get to the point of being ravenous (that's when bingeing is likely to happen), a hunger pang, a craving, or the fact that it's 3:00 p.m. should not send you racing for the vending machine or obsessing about the energy bar in your purse. Ideally, you should put off eating until your stomach is growling and it's difficult to concentrate.  Use it to lose it. When you feel the urge to eat, use the HALT method. Ask yourself, Am I really hungry? Or am I angry or anxious, lonely or bored, or tired? If you're still not certain, try the apple test. If you're truly hungry, an apple should seem delicious; if it doesn't, something else is going on. Or you can try drinking water and making yourself busy, if you are still hungry try a healthy snack.  4. Not All Calories Are Created Equal The mechanics of weight loss are pretty simple: Take in fewer calories than you use for energy. But the kind of food you eat makes all the difference. Processed food that's high in saturated fat and refined starch or sugar can cause inflammation that disrupts the hormone signals that tell your brain you're full. The result: You eat a lot more.  Use it to lose it. Clean up your diet. Swap in whole, unprocessed foods, including vegetables, lean protein, and healthy fats that will fill you up and give you the biggest nutritional bang for your calorie buck. In a few weeks, as your brain starts receiving regular hunger and fullness signals once again, you'll notice that you feel less hungry overall and naturally start cutting back on the amount you eat.  5. Protein, Produce, and Plant-Based Fats Are Your Weight-Loss Trinity Here's why eating the three Ps  regularly will help you drop pounds. Protein fills you up. You need it to build lean muscle, which keeps your metabolism humming so that you can torch more fat. People in a weight-loss program who ate double the recommended daily allowance for protein (about 110 grams for a 150-pound woman) lost 70 percent of their weight from fat, while people who ate the RDA lost only about 40 percent, one study found. Produce is packed with filling fiber. "It's very difficult to consume too many calories if you're eating a lot of vegetables. Example: Three cups of broccoli is a lot of food, yet only 93 calories. (Fruit is another story. It can be easy to overeat and  can contain a lot of calories from sugar, so be sure to monitor your intake.) Plant-based fats like olive oil and those in avocados and nuts are healthy and extra satiating.  Use it to lose it. Aim to incorporate each of the three Ps into every meal and snack. People who eat protein throughout the day are able to keep weight off, according to a study in the [place] of Clinical Nutrition. In addition to meat, poultry and seafood, good sources are beans, lentils, eggs, tofu, and yogurt. As for fat, keep portion sizes in check by measuring out salad dressing, oil, and nut butters (shoot for one to two tablespoons). Finally, eat veggies or a little fruit at every meal. People who did that consumed 308 fewer calories but didn't feel any hungrier than when they didn't eat more produce.  7. How You Eat Is As Important As What You Eat In order for your brain to register that you're full, you need to focus on what you're eating. Sit down whenever you eat, preferably at a table. Turn off the TV or computer, put down your phone, and look at your food. Smell it. Chew slowly, and don't put another bite on your fork until you swallow. When women ate lunch this attentively, they consumed 30 percent less when snacking later than those who listened to an audiobook at  lunchtime, according to a study in the [place] of Nutrition. 8. Weighing Yourself Really Works The scale provides the best evidence about whether your efforts are paying off. Seeing the numbers tick up or down or stagnate is motivation to keep going-or to rethink your approach. A 2015 study at [facility] found that daily weigh-ins helped people lose more weight, keep it off, and maintain that loss, even after two years. Use it to lose it. Step on the scale at the same time every day for the best results. If your weight shoots up several pounds from one weigh-in to the next, don't freak out. Eating a lot of salt the night before or having your period is the likely culprit. The number should return to normal in a day or two. It's a steady climb that you need to do something about. 9. Too Much Stress and Too Little Sleep Are Your Enemies When you're tired and frazzled, your body cranks up the production of cortisol, the stress hormone that can cause carb cravings. Not getting enough sleep also boosts your levels of ghrelin, a hormone associated with hunger, while suppressing leptin, a hormone that signals fullness and satiety. People on a diet who slept only five and a half hours a night for two weeks lost 55 percent less fat and were hungrier than those who slept eight and a half hours, according to a study in the [place]. Use it to lose it. Prioritize sleep, aiming for seven hours or more a night, which research shows helps lower stress. And make sure you're getting quality zzz's. If a snoring spouse or a fidgety cat wakes you up frequently throughout the night, you may end up getting the equivalent of just four hours of sleep, according to a study from [facility]. Keep pets out of the bedroom, and use a white-noise app to drown out snoring. 10. You Will Hit a plateau-And You Can Bust Through It As you slim down, your body releases much less leptin, the  fullness hormone.  If you're not strength training, start right now. Building muscle can raise your  metabolism to help you overcome a plateau. To keep your body challenged and burning calories, incorporate new moves and more intense intervals into your workouts or add another sweat session to your weekly routine. Alternatively, cut an extra 100 calories or so a day from your diet. Now that you've lost weight, your body simply doesn't need as much fuel.      Since food equals calories, in order to lose weight you must either eat fewer calories, exercise more to burn off calories with activity, or both. Food that is not used to fuel the body is stored as fat. A major component of losing weight is to make smarter food choices. Here's how:  1)   Limit non-nutritious foods, such as: Sugar, honey, syrups and candy Pastries, donuts, pies, cakes and cookies Soft drinks, sweetened juices and alcoholic beverages  2)  Cut down on high-fat foods by: - Choosing poultry, fish or lean red meat - Choosing low-fat cooking methods, such as baking, broiling, steaming, grilling and boiling - Using low-fat or non-fat dairy products - Using vinaigrette, herbs, lemon or fat-free salad dressings - Avoiding fatty meats, such as bacon, sausage, franks, ribs and luncheon meats - Avoiding high-fat snacks like nuts, chips and chocolate - Avoiding fried foods - Using less butter, margarine, oil and mayonnaise - Avoiding high-fat gravies, cream sauces and cream-based soups  3) Eat a variety of foods, including: - Fruit and vegetables that are raw, steamed or baked - Whole grains, breads, cereal, rice and pasta - Dairy products, such as low-fat or non-fat milk or yogurt, low-fat cottage cheese and low-fat cheese - Protein-rich foods like chicken, [place], fish, lean meat and legumes, or beans  4) Change your eating habits by: - Eat three balanced meals a day to help control your hunger - Watch portion sizes and eat  small servings of a variety of foods - Choose low-calorie snacks - Eat only when you are hungry and stop when you are satisfied - Eat slowly and try not to perform other tasks while eating - Find other activities to distract you from food, such as walking, taking up a hobby or being involved in the community - Include regular exercise in your daily routine ( minimum of 20 min of moderate-intensity exercise at least 5 days/week)  - Find a support group, if necessary, for emotional support in your weight loss journey         Easy ways to cut 100 calories   1. Eat your eggs with hot sauce OR salsa instead of cheese.  Eggs are great for breakfast, but many people consider eggs and cheese to be BFFs. Instead of cheese-1 oz. of cheddar has 114 calories-top your eggs with hot sauce, which contains no calories and helps with satiety and metabolism. Salsa is also a great option!!  2. Top your toast, waffles or pancakes with fresh berries instead of jelly or syrup. Half a cup of berries-fresh, frozen or thawed-has about 40 calories, compared with 2 tbsp. of maple syrup or jelly, which both have about 100 calories. The berries will also give you a good punch of fiber, which helps keep you full and satisfied and won't spike blood sugar quickly like the jelly or syrup. 3. Swap the non-fat latte for black coffee with a splash of half-and-half. Contrary to its name, that non-fat latte has 130 calories and a startling 19g of carbohydrates per 16 oz. serving. Replacing that 'light' drinkable dessert with a black coffee with a splash of  half-and-half saves you more than 100 calories per 16 oz. serving. 4. Sprinkle salads with freeze-dried raspberries instead of dried cranberries. If you want a sweet addition to your nutritious salad, stay away from dried cranberries. They have a whopping 130 calories per  cup and 30g carbohydrates. Instead, sprinkle freeze-dried raspberries guilt-free and save more than 100  calories per  cup serving, adding 3g of belly-filling fiber. 5. Go for mustard in place of mayo on your sandwich. Mustard can add really nice flavor to any sandwich, and there are tons of varieties, from spicy to honey. A serving of mayo is 95 calories, versus 10 calories in a serving of mustard.  Or try an avocado mayo spread: You can find the recipe few click this link: https://www.californiaavocado.com/recipes/recipe-container/[place]-avocado-mayo 6. Choose a DIY salad dressing instead of the store-bought kind. Mix Dijon or whole grain mustard with low-fat Kefir or red wine vinegar and garlic. 7. Use hummus as a spread instead of a dip. Use hummus as a spread on a high-fiber cracker or tortilla with a sandwich and save on calories without sacrificing taste. 8. Pick just one salad "accessory." Salad isn't automatically a calorie winner. It's easy to over-accessorize with toppings. Instead of topping your salad with nuts, avocado and cranberries (all three will clock in at 313 calories), just pick one. The next day, choose a different accessory, which will also keep your salad interesting. You don't wear all your jewelry every day, right? 9. Ditch the white pasta in favor of spaghetti squash. One cup of cooked spaghetti squash has about 40 calories, compared with traditional spaghetti, which comes with more than 200. Spaghetti squash is also nutrient-dense. It's a good source of fiber and Vitamins A and C, and it can be eaten just like you would eat pasta-with a great tomato sauce and [place] meatballs or with pesto, tofu and spinach, for example. 10. Dress up your chili, soups and stews with non-fat [place] yogurt instead of sour cream. Just a 'dollop' of sour cream can set you back 115 calories and a whopping 12g of fat-seven of which are of the artery-clogging variety. Added bonus: [place] yogurt is packed with muscle-building protein, calcium and B Vitamins. 11. Mash cauliflower instead of mashed  potatoes. One cup of traditional mashed potatoes-in all their creamy goodness-has more than 200 calories, compared to mashed cauliflower, which you can typically eat for less than 100 calories per 1 cup serving. Cauliflower is a great source of the antioxidant indole-3-carbinol (I3C), which may help reduce the risk of some cancers, like breast cancer. 12. Ditch the ice cream sundae in favor of a [place] yogurt parfait. Instead of a cup of ice cream or fro-yo for dessert, try 1 cup of nonfat Greek yogurt topped with fresh berries and a sprinkle of cacao nibs. Both toppings are packed with antioxidants, which can help reduce cellular inflammation and oxidative damage. And the comparison is a no-brainer: One cup of ice cream has about 275 calories; one cup of frozen yogurt has about 230; and a cup of Greek yogurt has just 130, plus twice the protein, so you're less likely to return to the freezer for a second helping. 13. Put olive oil in a spray container instead of using it directly from the bottle. Each tablespoon of olive oil is 120 calories and 15g of fat. Use a mister instead of pouring it straight into the pan or onto a salad. This allows for portion control and will save you more than 100 calories. 14.  When baking, substitute canned pumpkin for butter or oil. Canned pumpkin-not pumpkin pie mix-is loaded with Vitamin A, which is important for skin and eye health, as well as immunity. And the comparisons are pretty crazy:  cup of canned pumpkin has about 40 calories, compared to butter or oil, which has more than 800 calories. Yes, 800 calories. Applesauce and mashed banana can also serve as good substitutions for butter or oil, usually in a 1:1 ratio. 15. Top casseroles with high-fiber cereal instead of breadcrumbs. Breadcrumbs are typically made with white bread, while breakfast cereals contain 5-9g of fiber per serving. Not only will you save more than 150 calories per  cup serving, the swap will also  keep you more full and you'll get a metabolism boost from the added fiber. 16. Snack on pistachios instead of macadamia nuts. Believe it or not, you get the same amount of calories from 35 pistachios (100 calories) as you would from only five macadamia nuts. 17. Chow down on kale chips rather than potato chips. This is my favorite 'don't knock it 'till you try it' swap. Kale chips are so easy to make at home, and you can spice them up with a little grated parmesan or chili powder. Plus, they're a mere fraction of the calories of potato chips, but with the same crunch factor we crave so often. 18. Add seltzer and some fruit slices to your cocktail instead of soda or fruit juice. One cup of soda or fruit juice can pack on as much as 140 calories. Instead, use seltzer and fruit slices. The fruit provides valuable phytochemicals, such as flavonoids and anthocyanins, which help to combat cancer and stave off the aging process.

## 2016-10-28 NOTE — Assessment & Plan Note (Signed)
[facility]  RTC if she wishes to work on diet/ start regimen of tracking foods, creating goals etc.

## 2016-12-04 DIAGNOSIS — K08409 Partial loss of teeth, unspecified cause, unspecified class: Secondary | ICD-10-CM | POA: Diagnosis not present

## 2016-12-04 DIAGNOSIS — G4700 Insomnia, unspecified: Secondary | ICD-10-CM | POA: Diagnosis not present

## 2016-12-04 DIAGNOSIS — R69 Illness, unspecified: Secondary | ICD-10-CM | POA: Diagnosis not present

## 2016-12-04 DIAGNOSIS — G5601 Carpal tunnel syndrome, right upper limb: Secondary | ICD-10-CM | POA: Diagnosis not present

## 2016-12-04 DIAGNOSIS — K219 Gastro-esophageal reflux disease without esophagitis: Secondary | ICD-10-CM | POA: Diagnosis not present

## 2016-12-04 DIAGNOSIS — M542 Cervicalgia: Secondary | ICD-10-CM | POA: Diagnosis not present

## 2016-12-04 DIAGNOSIS — M5440 Lumbago with sciatica, unspecified side: Secondary | ICD-10-CM | POA: Diagnosis not present

## 2016-12-04 DIAGNOSIS — M62830 Muscle spasm of back: Secondary | ICD-10-CM | POA: Diagnosis not present

## 2016-12-04 DIAGNOSIS — Z0000 Encounter for general adult medical examination without abnormal findings: Secondary | ICD-10-CM | POA: Diagnosis not present

## 2016-12-04 DIAGNOSIS — E7800 Pure hypercholesterolemia, unspecified: Secondary | ICD-10-CM | POA: Diagnosis not present

## 2016-12-04 DIAGNOSIS — R32 Unspecified urinary incontinence: Secondary | ICD-10-CM | POA: Diagnosis not present

## 2016-12-04 DIAGNOSIS — I10 Essential (primary) hypertension: Secondary | ICD-10-CM | POA: Diagnosis not present

## 2016-12-04 DIAGNOSIS — H6122 Impacted cerumen, left ear: Secondary | ICD-10-CM | POA: Diagnosis not present

## 2016-12-21 ENCOUNTER — Other Ambulatory Visit: Payer: Self-pay

## 2016-12-21 ENCOUNTER — Telehealth: Payer: Self-pay | Admitting: Family Medicine

## 2016-12-21 DIAGNOSIS — G8929 Other chronic pain: Secondary | ICD-10-CM

## 2016-12-21 DIAGNOSIS — F341 Dysthymic disorder: Secondary | ICD-10-CM

## 2016-12-21 DIAGNOSIS — I10 Essential (primary) hypertension: Secondary | ICD-10-CM

## 2016-12-21 DIAGNOSIS — M549 Dorsalgia, unspecified: Secondary | ICD-10-CM

## 2016-12-21 MED ORDER — CYCLOBENZAPRINE HCL 5 MG PO TABS: ORAL_TABLET | ORAL | 0 refills | Status: DC

## 2016-12-21 MED ORDER — SERTRALINE HCL 50 MG PO TABS: 50.0000 mg | ORAL_TABLET | Freq: Every day | ORAL | 0 refills | Status: DC

## 2016-12-21 NOTE — Telephone Encounter (Signed)
Patient is requesting a refill of her cyclobenzaprine and sertraline sent to [facility] on [address]

## 2016-12-21 NOTE — Telephone Encounter (Signed)
Medication sent into [facility] called patient and notified. MPulliam, CMA/RT(R)

## 2016-12-21 NOTE — Telephone Encounter (Signed)
Patient called for refills on Sertraline and Cyclobenzaprine - sent into [facility].  MPulliam, CMA/RT(R)

## 2016-12-30 ENCOUNTER — Other Ambulatory Visit: Payer: Self-pay

## 2016-12-30 ENCOUNTER — Other Ambulatory Visit: Payer: Self-pay | Admitting: Internal Medicine

## 2016-12-30 DIAGNOSIS — I10 Essential (primary) hypertension: Secondary | ICD-10-CM

## 2016-12-30 MED ORDER — HYDROCHLOROTHIAZIDE 25 MG PO TABS: 25.0000 mg | ORAL_TABLET | Freq: Every day | ORAL | 1 refills | Status: DC

## 2016-12-30 NOTE — Telephone Encounter (Signed)
Pharmacy sent over refill request for Hydrochlorothiazide  - refill sent in.  MPulliam, CMA/RT(R)

## 2017-02-18 ENCOUNTER — Other Ambulatory Visit: Payer: Medicare HMO

## 2017-02-18 DIAGNOSIS — Z1321 Encounter for screening for nutritional disorder: Secondary | ICD-10-CM | POA: Diagnosis not present

## 2017-02-18 DIAGNOSIS — Z833 Family history of diabetes mellitus: Secondary | ICD-10-CM | POA: Diagnosis not present

## 2017-02-18 DIAGNOSIS — I10 Essential (primary) hypertension: Secondary | ICD-10-CM | POA: Diagnosis not present

## 2017-02-18 DIAGNOSIS — K760 Fatty (change of) liver, not elsewhere classified: Secondary | ICD-10-CM | POA: Diagnosis not present

## 2017-02-18 DIAGNOSIS — R739 Hyperglycemia, unspecified: Secondary | ICD-10-CM | POA: Diagnosis not present

## 2017-02-18 DIAGNOSIS — K259 Gastric ulcer, unspecified as acute or chronic, without hemorrhage or perforation: Secondary | ICD-10-CM | POA: Diagnosis not present

## 2017-02-18 DIAGNOSIS — Z789 Other specified health status: Secondary | ICD-10-CM | POA: Diagnosis not present

## 2017-02-18 DIAGNOSIS — D582 Other hemoglobinopathies: Secondary | ICD-10-CM

## 2017-02-18 DIAGNOSIS — E784 Other hyperlipidemia: Secondary | ICD-10-CM | POA: Diagnosis not present

## 2017-02-18 DIAGNOSIS — E7849 Other hyperlipidemia: Secondary | ICD-10-CM

## 2017-02-18 DIAGNOSIS — R5383 Other fatigue: Secondary | ICD-10-CM | POA: Diagnosis not present

## 2017-02-19 LAB — CBC WITH DIFFERENTIAL/PLATELET
BASOS: 1 %
Basophils Absolute: 0.1 10*3/uL (ref 0.0–0.2)
EOS (ABSOLUTE): 0.3 10*3/uL (ref 0.0–0.4)
EOS: 4 %
HEMATOCRIT: 46.2 % (ref 34.0–46.6)
HEMOGLOBIN: 15.6 g/dL (ref 11.1–15.9)
IMMATURE GRANS (ABS): 0 10*3/uL (ref 0.0–0.1)
Immature Granulocytes: 0 %
LYMPHS: 28 %
Lymphocytes Absolute: 1.8 10*3/uL (ref 0.7–3.1)
MCH: 32.7 pg (ref 26.6–33.0)
MCHC: 33.8 g/dL (ref 31.5–35.7)
MCV: 97 fL (ref 79–97)
Monocytes Absolute: 0.5 10*3/uL (ref 0.1–0.9)
Monocytes: 8 %
NEUTROS ABS: 3.7 10*3/uL (ref 1.4–7.0)
Neutrophils: 59 %
Platelets: 228 10*3/uL (ref 150–379)
RBC: 4.77 x10E6/uL (ref 3.77–5.28)
RDW: 13.4 % (ref 12.3–15.4)
WBC: 6.3 10*3/uL (ref 3.4–10.8)

## 2017-02-19 LAB — COMPREHENSIVE METABOLIC PANEL
A/G RATIO: 1.6 (ref 1.2–2.2)
ALBUMIN: 4.4 g/dL (ref 3.6–4.8)
ALT: 48 IU/L — ABNORMAL HIGH (ref 0–32)
AST: 53 IU/L — ABNORMAL HIGH (ref 0–40)
Alkaline Phosphatase: 97 IU/L (ref 39–117)
BUN / CREAT RATIO: 30 — AB (ref 12–28)
BUN: 24 mg/dL (ref 8–27)
Bilirubin Total: 0.6 mg/dL (ref 0.0–1.2)
CALCIUM: 9.5 mg/dL (ref 8.7–10.3)
CO2: 22 mmol/L (ref 20–29)
Chloride: 102 mmol/L (ref 96–106)
Creatinine, Ser: 0.81 mg/dL (ref 0.57–1.00)
GFR, EST AFRICAN AMERICAN: 91 mL/min/{1.73_m2} (ref >59)
GFR, EST NON AFRICAN AMERICAN: 79 mL/min/{1.73_m2} (ref >59)
GLOBULIN, TOTAL: 2.8 g/dL (ref 1.5–4.5)
Glucose: 128 mg/dL — ABNORMAL HIGH (ref 65–99)
Potassium: 3.9 mmol/L (ref 3.5–5.2)
Sodium: 143 mmol/L (ref 134–144)
TOTAL PROTEIN: 7.2 g/dL (ref 6.0–8.5)

## 2017-02-19 LAB — LIPID PANEL
CHOLESTEROL TOTAL: 213 mg/dL — AB (ref 100–199)
Chol/HDL Ratio: 5.2 ratio — ABNORMAL HIGH (ref 0.0–4.4)
HDL: 41 mg/dL (ref >39)
LDL CALC: 132 mg/dL — AB (ref 0–99)
TRIGLYCERIDES: 198 mg/dL — AB (ref 0–149)
VLDL Cholesterol Cal: 40 mg/dL (ref 5–40)

## 2017-02-19 LAB — TSH: TSH: 3.57 u[IU]/mL (ref 0.450–4.500)

## 2017-02-19 LAB — HEMOGLOBIN A1C
ESTIMATED AVERAGE GLUCOSE: 117 mg/dL
HEMOGLOBIN A1C: 5.7 % — AB (ref 4.8–5.6)

## 2017-02-19 LAB — VITAMIN D 25 HYDROXY (VIT D DEFICIENCY, FRACTURES): VIT D 25 HYDROXY: 51 ng/mL (ref 30.0–100.0)

## 2017-02-19 LAB — VITAMIN B12: Vitamin B-12: 800 pg/mL (ref 232–1245)

## 2017-02-26 ENCOUNTER — Ambulatory Visit (INDEPENDENT_AMBULATORY_CARE_PROVIDER_SITE_OTHER): Payer: Medicare HMO | Admitting: Family Medicine

## 2017-02-26 ENCOUNTER — Encounter: Payer: Self-pay | Admitting: Family Medicine

## 2017-02-26 VITALS — BP 134/83 | HR 82 | Ht 63.0 in | Wt 208.4 lb

## 2017-02-26 DIAGNOSIS — E7849 Other hyperlipidemia: Secondary | ICD-10-CM

## 2017-02-26 DIAGNOSIS — Z87891 Personal history of nicotine dependence: Secondary | ICD-10-CM

## 2017-02-26 DIAGNOSIS — K760 Fatty (change of) liver, not elsewhere classified: Secondary | ICD-10-CM

## 2017-02-26 DIAGNOSIS — F341 Dysthymic disorder: Secondary | ICD-10-CM

## 2017-02-26 DIAGNOSIS — R7303 Prediabetes: Secondary | ICD-10-CM

## 2017-02-26 DIAGNOSIS — K259 Gastric ulcer, unspecified as acute or chronic, without hemorrhage or perforation: Secondary | ICD-10-CM

## 2017-02-26 DIAGNOSIS — Z789 Other specified health status: Secondary | ICD-10-CM

## 2017-02-26 DIAGNOSIS — I10 Essential (primary) hypertension: Secondary | ICD-10-CM

## 2017-02-26 DIAGNOSIS — E784 Other hyperlipidemia: Secondary | ICD-10-CM

## 2017-02-26 NOTE — Assessment & Plan Note (Addendum)
Pt declines nutritional counseling/ referral  - handouts given  Recheck A1c in 6 months.

## 2017-02-26 NOTE — Assessment & Plan Note (Addendum)
Still waiting for GI doc records.  She is being worked up and is currently seeing Dr. Havery Moros but was seeing Dr. Deatra Ina in the past.     Asked patient to please get me medical records from her physician.

## 2017-02-26 NOTE — Assessment & Plan Note (Signed)
Patient is unable to tolerate statins.    We will try intensive dietary and lifestyle modification.

## 2017-02-26 NOTE — Patient Instructions (Addendum)
Follow-up with your gastroenterologist Dr. Havery Moros regarding your elevated liver function tests.  Please make sure they send me your medical records after you are seen.  Last Pap smear was done in 2006 and was normal.  Patient had a full hysterectomy age 61 or so.  The only one ovary.  No need for further testing she was told.    Guidelines for a Low Cholesterol, Low Saturated Fat Diet   Fats - Limit total intake of fats and oils. - Avoid butter, stick margarine, shortening, lard, palm and coconut oils. - Limit mayonnaise, salad dressings, gravies and sauces, unless they are homemade with low-fat ingredients. - Limit chocolate. - Choose low-fat and nonfat products, such as low-fat mayonnaise, low-fat or non-hydrogenated peanut butter, low-fat or fat-free salad dressings and nonfat gravy. - Use vegetable oil, such as canola or olive oil. - Look for margarine that does not contain trans fatty acids. - Use nuts in moderate amounts. - Read ingredient labels carefully to determine both amount and type of fat present in foods. Limit saturated and trans fats! - Avoid high-fat processed and convenience foods.  Meats and Meat Alternatives - Choose fish, chicken, [place] and lean meats. - Use dried beans, peas, lentils and tofu. - Limit egg yolks to three to four per week. - If you eat red meat, limit to no more than three servings per week and choose loin or round cuts. - Avoid fatty meats, such as bacon, sausage, franks, luncheon meats and ribs. - Avoid all organ meats, including liver.  Dairy - Choose nonfat or low-fat milk, yogurt and cottage cheese. - Most cheeses are high in fat. Choose cheeses made from non-fat milk, such as mozzarella and ricotta cheese. - Choose light or fat-free cream cheese and sour cream. - Avoid cream and sauces made with cream.  Fruits and Vegetables - Eat a wide variety of fruits and vegetables. - Use lemon juice, vinegar or "mist" olive oil on  vegetables. - Avoid adding sauces, fat or oil to vegetables.  Breads, Cereals and Grains - Choose whole-grain breads, cereals, pastas and rice. - Avoid high-fat snack foods, such as granola, cookies, pies, pastries, doughnuts and croissants.  Cooking Tips - Avoid deep fried foods. - Trim visible fat off meats and remove skin from poultry before cooking. - Bake, broil, boil, poach or roast poultry, fish and lean meats. - Drain and discard fat that drains out of meat as you cook it. - Add little or no fat to foods. - Use vegetable oil sprays to grease pans for cooking or baking. - Steam vegetables. - Use herbs or no-oil marinades to flavor foods.      Risk factors for prediabetes and type 2 diabetes  Researchers don't fully understand why some people develop prediabetes and type 2 diabetes and others don't.  It's clear that certain factors increase the risk, however, including:  Weight. The more fatty tissue you have, the more resistant your cells become to insulin.  Inactivity. The less active you are, the greater your risk. Physical activity helps you control your weight, uses up glucose as energy and makes your cells more sensitive to insulin.  Family history. Your risk increases if a parent or sibling has type 2 diabetes.  Race. Although it's unclear why, people of certain races - including blacks, Hispanics, American Indians and Asian-Americans - are at higher risk.  Age. Your risk increases as you get older. This may be because you tend to exercise less, lose muscle mass and  gain weight as you age. But type 2 diabetes is also increasing dramatically among children, adolescents and younger adults.  Gestational diabetes. If you developed gestational diabetes when you were pregnant, your risk of developing prediabetes and type 2 diabetes later increases. If you gave birth to a baby weighing more than 9 pounds (4 kilograms), you're also at risk of type 2 diabetes.  Polycystic ovary  syndrome. For women, having polycystic ovary syndrome - a common condition characterized by irregular menstrual periods, excess hair growth and obesity - increases the risk of diabetes.  High blood pressure. Having blood pressure over 140/90 millimeters of mercury (mm Hg) is linked to an increased risk of type 2 diabetes.  Abnormal cholesterol and triglyceride levels. If you have low levels of high-density lipoprotein (HDL), or "good," cholesterol, your risk of type 2 diabetes is higher. Triglycerides are another type of fat carried in the blood. People with high levels of triglycerides have an increased risk of type 2 diabetes. Your doctor can let you know what your cholesterol and triglyceride levels are.  A good guide to good carbs: The glycemic index ---If you have diabetes, or at risk for diabetes, you know all too well that when you eat carbohydrates, your blood sugar goes up. The total amount of carbs you consume at a meal or in a snack mostly determines what your blood sugar will do. But the food itself also plays a role. A serving of white rice has almost the same effect as eating pure table sugar - a quick, high spike in blood sugar. A serving of lentils has a slower, smaller effect.  ---Picking good sources of carbs can help you control your blood sugar and your weight. Even if you don't have diabetes, eating healthier carbohydrate-rich foods can help ward off a host of chronic conditions, from heart disease to various cancers to, well, diabetes.  ---One way to choose foods is with the glycemic index (GI). This tool measures how much a food boosts blood sugar.  The glycemic index rates the effect of a specific amount of a food on blood sugar compared with the same amount of pure glucose. A food with a glycemic index of 28 boosts blood sugar only 28% as much as pure glucose. One with a GI of 95 acts like pure glucose.    High glycemic foods result in a quick spike in insulin and blood sugar  (also known as blood glucose).  Low glycemic foods have a slower, smaller effect- these are healthier for you.   Using the glycemic index Using the glycemic index is easy: choose foods in the low GI category instead of those in the high GI category (see below), and go easy on those in between. Low glycemic index (GI of 55 or less): Most fruits and vegetables, beans, minimally processed grains, pasta, low-fat dairy foods, and nuts.  Moderate glycemic index (GI 56 to 69): White and sweet potatoes, corn, white rice, couscous, breakfast cereals such as Cream of Wheat and Mini Wheats.  High glycemic index (GI of 70 or higher): White bread, rice cakes, most crackers, bagels, cakes, doughnuts, croissants, most packaged breakfast cereals. You can see the values for 100 commons foods and get links to more at www.health.CheapToothpicks.si.  Swaps for lowering glycemic index  Instead of this high-glycemic index food Eat this lower-glycemic index food  White rice Brown rice or converted rice  Instant oatmeal Steel-cut oats  Cornflakes Bran flakes  Baked potato Pasta, bulgur  White bread Whole-grain  bread  Corn Peas or leafy greens       Prediabetes Eating Plan  Prediabetes--also called impaired glucose tolerance or impaired fasting glucose--is a condition that causes blood sugar (blood glucose) levels to be higher than normal. Following a healthy diet can help to keep prediabetes under control. It can also help to lower the risk of type 2 diabetes and heart disease, which are increased in people who have prediabetes. Along with regular exercise, a healthy diet:  Promotes weight loss.  Helps to control blood sugar levels.  Helps to improve the way that the body uses insulin.   WHAT DO I NEED TO KNOW ABOUT THIS EATING PLAN?   Use the glycemic index (GI) to plan your meals. The index tells you how quickly a food will raise your blood sugar. Choose low-GI foods. These foods take a longer time  to raise blood sugar.  Pay close attention to the amount of carbohydrates in the food that you eat. Carbohydrates increase blood sugar levels.  Keep track of how many calories you take in. Eating the right amount of calories will help you to achieve a healthy weight. Losing about 7 percent of your starting weight can help to prevent type 2 diabetes.  You may want to follow a Mediterranean diet. This diet includes a lot of vegetables, lean meats or fish, whole grains, fruits, and healthy oils and fats.   WHAT FOODS CAN I EAT?  Grains Whole grains, such as whole-wheat or whole-grain breads, crackers, cereals, and pasta. Unsweetened oatmeal. Bulgur. Barley. Quinoa. Brown rice. Corn or whole-wheat flour tortillas or taco shells. Vegetables Lettuce. Spinach. Peas. Beets. Cauliflower. Cabbage. Broccoli. Carrots. Tomatoes. Squash. Eggplant. Herbs. Peppers. Onions. Cucumbers. Brussels sprouts. Fruits Berries. Bananas. Apples. Oranges. Grapes. Papaya. Mango. Pomegranate. Kiwi. Grapefruit. Cherries. Meats and Other Protein Sources Seafood. Lean meats, such as chicken and [place] or lean cuts of pork and beef. Tofu. Eggs. Nuts. Beans. Dairy Low-fat or fat-free dairy products, such as yogurt, cottage cheese, and cheese. Beverages Water. Tea. Coffee. Sugar-free or diet soda. Seltzer water. Milk. Milk alternatives, such as soy or almond milk. Condiments Mustard. Relish. Low-fat, low-sugar ketchup. Low-fat, low-sugar barbecue sauce. Low-fat or fat-free mayonnaise. Sweets and Desserts Sugar-free or low-fat pudding. Sugar-free or low-fat ice cream and other frozen treats. Fats and Oils Avocado. Walnuts. Olive oil. The items listed above may not be a complete list of recommended foods or beverages. Contact your dietitian for more options.    WHAT FOODS ARE NOT RECOMMENDED?  Grains Refined white flour and flour products, such as bread, pasta, snack foods, and cereals. Beverages Sweetened drinks,  such as sweet iced tea and soda. Sweets and Desserts Baked goods, such as cake, cupcakes, pastries, cookies, and cheesecake. The items listed above may not be a complete list of foods and beverages to avoid. Contact your dietitian for more information.   This information is not intended to replace advice given to you by your health care provider. Make sure you discuss any questions you have with your health care provider.   Document Released: 10/09/2014 Document Reviewed: 10/09/2014 Elsevier Interactive Patient Education [employer].

## 2017-02-26 NOTE — Progress Notes (Signed)
Assessment and plan:  1. Essential hypertension   2. ANXIETY DEPRESSION   3. Gastric erosion, unspecified chronicity   4. Other hyperlipidemia   5. Nonalcoholic fatty liver disease- elevated LFT's   6. Pre-diabetes   7. Stopped smoking with greater than 30 pack year history   8. Statin intolerance- elevated LFT's and severe myalgias      Essential hypertension  ANXIETY DEPRESSION  Gastric erosion, unspecified chronicity  Other hyperlipidemia  Nonalcoholic fatty liver disease- elevated LFT's  Pre-diabetes  Stopped smoking with greater than 30 pack year history  Statin intolerance- elevated LFT's and severe myalgias  Nonalcoholic fatty liver disease- elevated LFT's Still waiting for GI doc records.  She is being worked up and is currently seeing Dr. Havery Moros but was seeing Dr. Deatra Ina in the past.     Asked patient to please get me medical records from her physician.  Pre-diabetes Pt declines nutritional counseling/ referral  - handouts given  Recheck A1c in 6 months.  Hyperlipidemia Patient is unable to tolerate statins.    We will try intensive dietary and lifestyle modification.     Meds ordered this encounter  Medications  . TURMERIC PO    Sig: Take 1 tablet by mouth daily.  . Calcium Polycarbophil (FIBER-CAPS PO)    Sig: Take 1 capsule by mouth daily.    Discontinued Medications   No medications on file    Modified Medications   No medications on file     No orders of the defined types were placed in this encounter.    Return for f/up 7mo- chronic issues.  Anticipatory guidance and routine counseling done re: condition, txmnt options and need for follow up. All questions of patient's were answered.   Gross side effects, risk and benefits, and alternatives of medications discussed with patient.  Patient is aware that all medications have potential side effects and we are  unable to predict every sideeffect or drug-drug interaction that may occur.  Expresses verbal understanding and consents to current therapy plan and treatment regiment.  Please see AVS handed out to patient at the end of our visit for additional patient instructions/ counseling done pertaining to today's office visit.  Note: This document was prepared using Dragon voice recognition software and may include unintentional dictation errors.   ----------------------------------------------------------------------------------------------------------------------  Subjective:   CC:   Misty Decker is a 61 y.o. female who presents to [place] [facility] today for review and discussion of recent bloodwork that was done.  1. All recent blood work that we ordered was reviewed with patient today.  Patient was counseled on all abnormalities and we discussed dietary and lifestyle changes that could help those values (also medications when appropriate).  Extensive health counseling performed and all patient's concerns/ questions were addressed.  2. Obese >er 35:   Pt lost 12 lbs since last seen.  More active lately- making patio.   She and her husband have changed the way they eat secondary to what the labs showed.  She is here today to review her labs. 3. Pt and husband don't have a lot of money-- eats from [employer] only.  4. Mood- zoloft. Takes for anxiety/ sleep problems.    Wt Readings from Last 3 Encounters:  02/26/17 208 lb 6.4 oz (94.5 kg)  10/26/16 220 lb 8 oz (100 kg)  03/10/16 227 lb 3.2 oz (103.1 kg)   BP Readings from Last 3 Encounters:  02/26/17 134/83  10/26/16 133/73  03/10/16 130/70   Pulse Readings from Last 3 Encounters:  02/26/17 82  10/26/16 84  03/10/16 84   BMI Readings from Last 3 Encounters:  02/26/17 36.92 kg/m  10/26/16 39.06 kg/m  03/10/16 40.25 kg/m     Patient Care Team    Relationship Specialty Notifications Start End    Mellody Dance, DO PCP - General Family Medicine  10/26/16   Roseanne Kaufman, MD Consulting Physician Orthopedic Surgery  10/26/16   Druscilla Brownie, MD Consulting Physician Dermatology  10/26/16   Drema [name], MD  Physical Medicine and Rehabilitation  10/26/16   Glenna Fellows, MD Attending Physician Neurosurgery  10/26/16   Shela Leff, MD Resident Internal Medicine  10/26/16   Armbruster, Carlota Raspberry, MD Consulting Physician Gastroenterology  02/26/17     Full medical history updated and reviewed in the office today  Patient Active Problem List   Diagnosis Date Noted  . Stopped smoking with greater than 30 pack year history 02/26/2017    Priority: High  . Gastric erosions--- NSAID-related erosions 10/26/2016    Priority: High  . Pre-diabetes 10/26/2016    Priority: High  . Statin intolerance- elevated LFT's and severe myalgias 10/26/2016    Priority: High  . ANXIETY DEPRESSION 10/23/2009    Priority: High  . Hyperlipidemia 09/23/2006    Priority: High  . Essential hypertension 09/23/2006    Priority: High  . Family history of diabetes mellitus (DM)- Mom and sister 10/26/2016    Priority: Medium  . Obesity, Class II, BMI 35-39.9, with comorbidity 10/26/2016    Priority: Medium  . FIBROMYALGIA 05/16/2009    Priority: Medium  . HYSTERECTOMY, TOTAL, HX OF 09/23/2006    Priority: Medium  . Nonalcoholic fatty liver disease- elevated LFT's 02/27/2014    Priority: Low  . Gastroesophageal reflux disease without esophagitis 05/08/2013    Priority: Low  . DEGENERATIVE JOINT DISEASE 09/23/2006    Priority: Low  . Elevated hemoglobin ([place]) 01/17/2015  . Health care maintenance 04/26/2014  . Cervicogenic headache 04/25/2012  . History of spinal surgery 05/05/2011  . Back pain 05/05/2011    Past Medical History:  Diagnosis Date  . Degenerative joint disease    s/p cervical and lumbar fusions  . Depression   . Encounter for preventive health examination    Next  mammogram in 07/2009. Next colonoscopy in 09/2017. DEXA 4/09>Normal  . Encounter for screening colonoscopy 10/03/2007   Normal  . Fibromyalgia   . Hyperlipidemia   . Hypertension   . Sciatica     Past Surgical History:  Procedure Laterality Date  . CERVICAL FUSION     X 3  . LUMBAR FUSION     X 2  . pelvic prolapse    . RECTAL PROLAPSE REPAIR    . TONSILLECTOMY    . TOTAL ABDOMINAL HYSTERECTOMY      Social History  Substance Use Topics  . Smoking status: Former Smoker    Packs/day: 2.00    Years: 30.00    Types: Cigarettes    Quit date: 04/03/2003  . Smokeless tobacco: Never Used  . Alcohol use 0.0 oz/week     Comment: special occasions    Family Hx: Family History  Problem Relation Age of Onset  . Cancer Mother   . Diabetes Mother   . Breast cancer Mother   . Heart attack Mother   . Hyperlipidemia Mother   . Hyperlipidemia Father   . Hypertension Father   . Aneurysm  Father   . Dementia Maternal Grandfather   . Cancer Paternal Grandmother        intestinal     Medications: Current Outpatient Prescriptions  Medication Sig Dispense Refill  . APPLE CIDER VINEGAR PO Take 10 Units/mL by mouth daily.     . B Complex-Biotin-FA (SUPER B-100 PO) Take 1 tablet by mouth daily.    . Calcium Polycarbophil (FIBER-CAPS PO) Take 1 capsule by mouth daily.    . calcium-vitamin D (OSCAL) 250-125 MG-UNIT per tablet Take 2 tablets by mouth daily.     . cyclobenzaprine (FLEXERIL) 5 MG tablet TAKE ONE TABLET BY MOUTH AT BEDTIME AS NEEDED FOR MUSCLE SPASM 90 tablet 0  . hydrochlorothiazide (HYDRODIURIL) 25 MG tablet Take 1 tablet (25 mg total) by mouth daily. 90 tablet 1  . Magnesium 125 MG CAPS Take 0.5 capsules by mouth daily.    . Multiple Vitamin (MULTIVITAMIN) capsule Take 1 capsule by mouth daily.      . Omega-3 Fatty Acids (FISH OIL) 300 MG CAPS Take 1 capsule by mouth daily.      Marland Kitchen omeprazole (PRILOSEC) 20 MG capsule TAKE ONE CAPSULE BY MOUTH ONCE DAILY 90 capsule 1  .  sertraline (ZOLOFT) 50 MG tablet Take 1 tablet (50 mg total) by mouth daily. 90 tablet 0  . TURMERIC PO Take 1 tablet by mouth daily.     No current facility-administered medications for this visit.     Allergies:  Allergies  Allergen Reactions  . Beef-Derived Products Nausea And Vomiting  . Pork-Derived Products Nausea And Vomiting  . Codeine   . Doxycycline Nausea And Vomiting  . Latex     REACTION: rash  . Morphine   . Nsaids Other (See Comments)    PT has hx of ulcers.  . Penicillins   . Tramadol      Review of Systems: General:   No F/C, wt loss Pulm:   No DIB, SOB, pleuritic chest pain Card:  No CP, palpitations Abd:  No n/v/d or pain Ext:  No inc edema from baseline  Objective:  Blood pressure 134/83, pulse 82, height 5\' 3"  (1.6 m), weight 208 lb 6.4 oz (94.5 kg). Body mass index is 36.92 kg/m. Gen:   Well NAD, A and O *3 HEENT:    [place]/AT, EOMI,  MMM Lungs:   Normal work of breathing. CTA B/L, no Wh, rhonchi Heart:   RRR, S1, S2 WNL's, no MRG Abd:   No gross distention Exts:    warm, pink,  Brisk capillary refill, warm and well perfused.  Psych:    No HI/SI, judgement and insight good, Euthymic mood. Full Affect.   Recent Results (from the past 2160 hour(s))  CBC with Differential/Platelet     Status: None   Collection Time: 02/18/17  8:51 AM  Result Value Ref Range   WBC 6.3 3.4 - 10.8 x10E3/uL   RBC 4.77 3.77 - 5.28 x10E6/uL   Hemoglobin 15.6 11.1 - 15.9 g/dL   Hematocrit 46.2 34.0 - 46.6 %   MCV 97 79 - 97 fL   MCH 32.7 26.6 - 33.0 pg   MCHC 33.8 31.5 - 35.7 g/dL   RDW 13.4 12.3 - 15.4 %   Platelets 228 150 - 379 x10E3/uL   Neutrophils 59 Not Estab. %   Lymphs 28 Not Estab. %   Monocytes 8 Not Estab. %   Eos 4 Not Estab. %   Basos 1 Not Estab. %   Neutrophils Absolute 3.7 1.4 -  7.0 x10E3/uL   Lymphocytes Absolute 1.8 0.7 - 3.1 x10E3/uL   Monocytes Absolute 0.5 0.1 - 0.9 x10E3/uL   EOS (ABSOLUTE) 0.3 0.0 - 0.4 x10E3/uL   Basophils Absolute 0.1  0.0 - 0.2 x10E3/uL   Immature Granulocytes 0 Not Estab. %   Immature Grans (Abs) 0.0 0.0 - 0.1 x10E3/uL  Comprehensive metabolic panel     Status: Abnormal   Collection Time: 02/18/17  8:51 AM  Result Value Ref Range   Glucose 128 (H) 65 - 99 mg/dL   BUN 24 8 - 27 mg/dL   Creatinine, Ser 0.81 0.57 - 1.00 mg/dL   GFR calc non Af Amer 79 >59 mL/min/1.73   GFR calc Af Amer 91 >59 mL/min/1.73   BUN/Creatinine Ratio 30 (H) 12 - 28   Sodium 143 134 - 144 mmol/L   Potassium 3.9 3.5 - 5.2 mmol/L   Chloride 102 96 - 106 mmol/L   CO2 22 20 - 29 mmol/L   Calcium 9.5 8.7 - 10.3 mg/dL   Total Protein 7.2 6.0 - 8.5 g/dL   Albumin 4.4 3.6 - 4.8 g/dL   Globulin, Total 2.8 1.5 - 4.5 g/dL   Albumin/Globulin Ratio 1.6 1.2 - 2.2   Bilirubin Total 0.6 0.0 - 1.2 mg/dL   Alkaline Phosphatase 97 39 - 117 IU/L   AST 53 (H) 0 - 40 IU/L   ALT 48 (H) 0 - 32 IU/L  Hemoglobin A1c     Status: Abnormal   Collection Time: 02/18/17  8:51 AM  Result Value Ref Range   Hgb A1c MFr Bld 5.7 (H) 4.8 - 5.6 %    Comment:          Prediabetes: 5.7 - 6.4          Diabetes: >6.4          Glycemic control for adults with diabetes: <7.0    Est. average glucose Bld gHb Est-mCnc 117 mg/dL  VITAMIN D 25 Hydroxy (Vit-D Deficiency, Fractures)     Status: None   Collection Time: 02/18/17  8:51 AM  Result Value Ref Range   Vit D, 25-Hydroxy 51.0 30.0 - 100.0 ng/mL    Comment: Vitamin D deficiency has been defined by the Institute of Medicine and an Endocrine Society practice guideline as a level of serum 25-OH vitamin D less than 20 ng/mL (1,2). The Endocrine Society went on to further define vitamin D insufficiency as a level between 21 and 29 ng/mL (2). 1. IOM (Institute of Medicine). 2010. Dietary reference    intakes for calcium and D. [place]: The    [employer]. 2. Holick MF, [place], Bischoff-Ferrari HA, et al.    Evaluation, treatment, and prevention of vitamin D    deficiency: an Endocrine  Society clinical practice    guideline. JCEM. 2011 Jul; 96(7):1911-30.   TSH     Status: None   Collection Time: 02/18/17  8:51 AM  Result Value Ref Range   TSH 3.570 0.450 - 4.500 uIU/mL  Lipid panel     Status: Abnormal   Collection Time: 02/18/17  8:51 AM  Result Value Ref Range   Cholesterol, Total 213 (H) 100 - 199 mg/dL   Triglycerides 198 (H) 0 - 149 mg/dL   HDL 41 >39 mg/dL   VLDL Cholesterol Cal 40 5 - 40 mg/dL   LDL Calculated 132 (H) 0 - 99 mg/dL   Chol/HDL Ratio 5.2 (H) 0.0 - 4.4 ratio    Comment:  T. Chol/HDL Ratio                                             Men  Women                               1/2 Avg.Risk  3.4    3.3                                   Avg.Risk  5.0    4.4                                2X Avg.Risk  9.6    7.1                                3X Avg.Risk 23.4   11.0   Vitamin B12     Status: None   Collection Time: 02/18/17  8:51 AM  Result Value Ref Range   Vitamin B-12 800 232 - 1,245 pg/mL

## 2017-03-15 ENCOUNTER — Other Ambulatory Visit: Payer: Self-pay | Admitting: Family Medicine

## 2017-03-15 DIAGNOSIS — G8929 Other chronic pain: Secondary | ICD-10-CM

## 2017-03-15 DIAGNOSIS — M549 Dorsalgia, unspecified: Principal | ICD-10-CM

## 2017-03-23 ENCOUNTER — Ambulatory Visit (INDEPENDENT_AMBULATORY_CARE_PROVIDER_SITE_OTHER): Payer: Medicare HMO

## 2017-03-23 VITALS — BP 104/70 | HR 85 | Temp 98.5°F

## 2017-03-23 DIAGNOSIS — Z23 Encounter for immunization: Secondary | ICD-10-CM | POA: Diagnosis not present

## 2017-03-29 ENCOUNTER — Other Ambulatory Visit: Payer: Self-pay | Admitting: Family Medicine

## 2017-03-29 DIAGNOSIS — F341 Dysthymic disorder: Secondary | ICD-10-CM

## 2017-06-24 ENCOUNTER — Other Ambulatory Visit: Payer: Self-pay | Admitting: Family Medicine

## 2017-06-29 DIAGNOSIS — M4316 Spondylolisthesis, lumbar region: Secondary | ICD-10-CM | POA: Diagnosis not present

## 2017-07-22 ENCOUNTER — Other Ambulatory Visit: Payer: Self-pay | Admitting: Family Medicine

## 2017-07-22 DIAGNOSIS — Z1231 Encounter for screening mammogram for malignant neoplasm of breast: Secondary | ICD-10-CM

## 2017-08-25 ENCOUNTER — Encounter: Payer: Self-pay | Admitting: Gastroenterology

## 2017-09-06 ENCOUNTER — Ambulatory Visit
Admission: RE | Admit: 2017-09-06 | Discharge: 2017-09-06 | Disposition: A | Discharge status: Home / Self Care | Payer: Medicare HMO | Source: Ambulatory Visit | Attending: Family Medicine | Admitting: Family Medicine

## 2017-09-06 DIAGNOSIS — Z1231 Encounter for screening mammogram for malignant neoplasm of breast: Secondary | ICD-10-CM | POA: Diagnosis not present

## 2017-09-27 ENCOUNTER — Encounter: Payer: Self-pay | Admitting: Family Medicine

## 2017-09-27 ENCOUNTER — Ambulatory Visit (INDEPENDENT_AMBULATORY_CARE_PROVIDER_SITE_OTHER): Payer: Medicare HMO | Admitting: Family Medicine

## 2017-09-27 VITALS — BP 129/83 | HR 60 | Ht 63.0 in | Wt 179.2 lb

## 2017-09-27 DIAGNOSIS — K259 Gastric ulcer, unspecified as acute or chronic, without hemorrhage or perforation: Secondary | ICD-10-CM

## 2017-09-27 DIAGNOSIS — I10 Essential (primary) hypertension: Secondary | ICD-10-CM | POA: Diagnosis not present

## 2017-09-27 DIAGNOSIS — E669 Obesity, unspecified: Secondary | ICD-10-CM | POA: Diagnosis not present

## 2017-09-27 DIAGNOSIS — F341 Dysthymic disorder: Secondary | ICD-10-CM | POA: Diagnosis not present

## 2017-09-27 DIAGNOSIS — E782 Mixed hyperlipidemia: Secondary | ICD-10-CM

## 2017-09-27 DIAGNOSIS — R69 Illness, unspecified: Secondary | ICD-10-CM | POA: Diagnosis not present

## 2017-09-27 DIAGNOSIS — R7303 Prediabetes: Secondary | ICD-10-CM

## 2017-09-27 DIAGNOSIS — K219 Gastro-esophageal reflux disease without esophagitis: Secondary | ICD-10-CM | POA: Diagnosis not present

## 2017-09-27 NOTE — Patient Instructions (Signed)
Great job with the dietary and lifestyle changes that have been able to do to lose so much weight.  Your BMI is almost under 30 and into the overweight category which would significantly decrease your risk for disease.  Continue the great work.  We will see you in the near future for complete physical exam.  Come fasting as we will do blood work at that time    Please realize, EXERCISE IS MEDICINE!  -  American Heart Association [facility]) guidelines for exercise : If you are in good health, without any medical conditions, you should engage in 150 minutes of moderate intensity aerobic activity per week.  This means you should be huffing and puffing throughout your workout.   Engaging in regular exercise will improve brain function and memory, as well as improve mood, boost immune system and help with weight management.  As well as the other, more well-known effects of exercise such as decreasing blood sugar levels, decreasing blood pressure,  and decreasing bad cholesterol levels/ increasing good cholesterol levels.     -  The AHA strongly endorses consumption of a diet that contains a variety of foods from all the food categories with an emphasis on fruits and vegetables; fat-free and low-fat dairy products; cereal and grain products; legumes and nuts; and fish, poultry, and/or extra lean meats.    Excessive food intake, especially of foods high in saturated and trans fats, sugar, and salt, should be avoided.    Adequate water intake of roughly 1/2 of your weight in pounds, should equal the ounces of water per day you should drink.  So for instance, if you're 200 pounds, that would be 100 ounces of water per day.         Mediterranean Diet  Why follow it? Research shows. . Those who follow the Mediterranean diet have a reduced risk of heart disease  . The diet is associated with a reduced incidence of Parkinson's and Alzheimer's diseases . People following the diet may have longer life  expectancies and lower rates of chronic diseases  . The Dietary Guidelines for Americans recommends the Mediterranean diet as an eating plan to promote health and prevent disease  What Is the Mediterranean Diet?  . Healthy eating plan based on typical foods and recipes of Mediterranean-style cooking . The diet is primarily a plant based diet; these foods should make up a majority of meals   Starches - Plant based foods should make up a majority of meals - They are an important sources of vitamins, minerals, energy, antioxidants, and fiber - Choose whole grains, foods high in fiber and minimally processed items  - Typical grain sources include wheat, oats, barley, corn, brown rice, bulgar, farro, millet, polenta, couscous  - Various types of beans include chickpeas, lentils, fava beans, black beans, white beans   Fruits  Veggies - Large quantities of antioxidant rich fruits & veggies; 6 or more servings  - Vegetables can be eaten raw or lightly drizzled with oil and cooked  - Vegetables common to the traditional Mediterranean Diet include: artichokes, arugula, beets, broccoli, brussel sprouts, cabbage, carrots, celery, collard greens, cucumbers, eggplant, kale, leeks, lemons, lettuce, mushrooms, okra, onions, peas, peppers, potatoes, pumpkin, radishes, rutabaga, shallots, spinach, sweet potatoes, turnips, zucchini - Fruits common to the Mediterranean Diet include: apples, apricots, avocados, cherries, clementines, dates, figs, grapefruits, grapes, melons, nectarines, oranges, peaches, pears, pomegranates, strawberries, tangerines  Fats - Replace butter and margarine with healthy oils, such as olive oil, canola oil,  and tahini  - Limit nuts to no more than a handful a day  - Nuts include walnuts, almonds, pecans, pistachios, pine nuts  - Limit or avoid candied, honey roasted or heavily salted nuts - Olives are central to the Mediterranean diet - can be eaten whole or used in a variety of dishes    Meats Protein - Limiting red meat: no more than a few times a month - When eating red meat: choose lean cuts and keep the portion to the size of deck of cards - Eggs: approx. 0 to 4 times a week  - Fish and lean poultry: at least 2 a week  - Healthy protein sources include, chicken, [place], lean beef, lamb - Increase intake of seafood such as tuna, salmon, trout, mackerel, shrimp, scallops - Avoid or limit high fat processed meats such as sausage and bacon  Dairy - Include moderate amounts of low fat dairy products  - Focus on healthy dairy such as fat free yogurt, skim milk, low or reduced fat cheese - Limit dairy products higher in fat such as whole or 2% milk, cheese, ice cream  Alcohol - Moderate amounts of red wine is ok  - No more than 5 oz daily for women (all ages) and men older than age 90  - No more than 10 oz of wine daily for men younger than 67  Other - Limit sweets and other desserts  - Use herbs and spices instead of salt to flavor foods  - Herbs and spices common to the traditional Mediterranean Diet include: basil, bay leaves, chives, cloves, cumin, fennel, garlic, lavender, marjoram, mint, oregano, parsley, pepper, rosemary, sage, savory, sumac, tarragon, thyme   It's not just a diet, it's a lifestyle:  . The Mediterranean diet includes lifestyle factors typical of those in the region  . Foods, drinks and meals are best eaten with others and savored . Daily physical activity is important for overall good health . This could be strenuous exercise like running and aerobics . This could also be more leisurely activities such as walking, housework, yard-work, or taking the stairs . Moderation is the key; a balanced and healthy diet accommodates most foods and drinks . Consider portion sizes and frequency of consumption of certain foods   Meal Ideas & Options:  . Breakfast:  o Whole wheat toast or whole wheat English muffins with peanut butter & hard boiled egg o Steel cut  oats topped with apples & cinnamon and skim milk  o Fresh fruit: banana, strawberries, melon, berries, peaches  o Smoothies: strawberries, bananas, greek yogurt, peanut butter o Low fat greek yogurt with blueberries and granola  o Egg white omelet with spinach and mushrooms o Breakfast couscous: whole wheat couscous, apricots, skim milk, cranberries  . Sandwiches:  o Hummus and grilled vegetables (peppers, zucchini, squash) on whole wheat bread   o Grilled chicken on whole wheat pita with lettuce, tomatoes, cucumbers or tzatziki  o Tuna salad on whole wheat bread: tuna salad made with greek yogurt, olives, red peppers, capers, green onions o Garlic rosemary lamb pita: lamb sauted with garlic, rosemary, salt & pepper; add lettuce, cucumber, greek yogurt to pita - flavor with lemon juice and black pepper  . Seafood:  o Mediterranean grilled salmon, seasoned with garlic, basil, parsley, lemon juice and black pepper o Shrimp, lemon, and spinach whole-grain pasta salad made with low fat greek yogurt  o Seared scallops with lemon orzo  o Seared tuna steaks seasoned salt, pepper,  coriander topped with tomato mixture of olives, tomatoes, olive oil, minced garlic, parsley, green onions and cappers  . Meats:  o Herbed greek chicken salad with kalamata olives, cucumber, feta  o Red bell peppers stuffed with spinach, bulgur, lean ground beef (or lentils) & topped with feta   o Kebabs: skewers of chicken, tomatoes, onions, zucchini, squash  o [place] burgers: made with red onions, mint, dill, lemon juice, feta cheese topped with roasted red peppers . Vegetarian o Cucumber salad: cucumbers, artichoke hearts, celery, red onion, feta cheese, tossed in olive oil & lemon juice  o Hummus and whole grain pita points with a greek salad (lettuce, tomato, feta, olives, cucumbers, red onion) o Lentil soup with celery, carrots made with vegetable broth, garlic, salt and pepper  o Tabouli salad: parsley, bulgur,  mint, scallions, cucumbers, tomato, radishes, lemon juice, olive oil, salt and pepper.

## 2017-09-27 NOTE — Progress Notes (Signed)
Impression and Recommendations:    1. Essential hypertension   2. Obesity, Class I, BMI 30-34.9   3. Mixed hyperlipidemia   4. Pre-diabetes   5. ANXIETY DEPRESSION   6. Gastric erosion, unspecified chronicity   7. Gastroesophageal reflux disease, esophagitis presence not specified     1. Essential HTN -BP well-controlled at home. Pt is stable and asymptomatic. Continue meds as listed below.  -refill meds.  2. Obesity -Continue losing weight. She is down 30 lbs since last OV in Sept. 2018.  -Encouraged pt to lower her BMI to <30.  3. Mixed HLD -Pt could not afford krill oil and these gave her reflux. She has worked on cholesterol issues with diet/exercise. She recalls when she was 150 lb in the past she had HLD issues. Will recheck labs in near future.   4. Prediabetes -will check A1c at CPE. Continue diet/exercise.  5. Anxiety/depression -Per pt, she has stopped zoloft by herself. Mood is stable and improved from prior.  6. Gastric erosion/GERD -Pt reports after dietary changes her symptoms have resolved.    -Switch from benadryl to a daily OTC allergy medication like xyzal or zyrtec.   -Check labs and CPE in near future.  No orders of the defined types were placed in this encounter.   No orders of the defined types were placed in this encounter.   Gross side effects, risk and benefits, and alternatives of medications and treatment plan in general discussed with patient.  Patient is aware that all medications have potential side effects and we are unable to predict every side effect or drug-drug interaction that may occur.   Patient will call with any questions prior to using medication if they have concerns.  Expresses verbal understanding and consents to current therapy and treatment regimen.  No barriers to understanding were identified.  Red flag symptoms and signs discussed in detail.  Patient expressed understanding regarding what to do in case of  emergency\urgent symptoms  Please see AVS handed out to patient at the end of our visit for further patient instructions/ counseling done pertaining to today's office visit.   Return for next available CPE/ yrly physical, come fasting.    Note: This note was prepared with assistance of Dragon voice recognition software. Occasional wrong-word or sound-a-like substitutions may have occurred due to the inherent limitations of voice recognition software.  This document serves as a record of services personally performed by Mellody Dance, DO. It was created on her behalf by Mayer Masker, a trained medical scribe. The creation of this record is based on the scribe's personal observations and the provider's statements to them.   I have reviewed the above medical documentation for accuracy and completeness and I concur.  Mellody Dance 09/27/17 11:57 AM  ------------------------------------------------------------------------------------------------------------------------------------------------------------------------   Subjective:     HPI: Misty Decker is a 62 y.o. female who presents to [place] at [facility] today for follow up of HTN, prediabetes, and mood, as well as other issues as discussed below.   Mood She did not like the zoloft. She states she feels better and decided to come off it. She has no concerns.   Diet/exercise She has been working on diet/exercise. She walks 4-5 miles a day 4-5 times a week. She also works in the yard. She is down almost 30 lbs from 208 lb on 02-26-17. Today she is 179.    Env allergies She takes a benadryl at night because she is  itchy at the end of the day. She also states this occurs even hours after she bathes. She does not take a daily OTC medications.    Vaginal exam Today she complains of two nodules she feels on the inside of her vagina. She is unsure if they have been there as a result of her surgery. She states she  had a surgery for rectal and vaginal prolapse in the past with partial hysterectomy (age 69, she has one ovary). She does not have a gynecologist. She has lost 30 lbs since last OV.   GI She has a colonoscopy scheduled for next month.  She states she has a hemorrhoid and has been taking a stool softener. She also states her GERD symptoms have also improved after changing her diet.   HTN HPI:  -  Her blood pressure has been controlled at home.  Pt is checking it at home.   - Patient reports good compliance with blood pressure medications  - Denies medication S-E   - Smoking Status noted   - She denies new onset of: chest pain, exercise intolerance, shortness of breath, dizziness, visual changes, headache, lower extremity swelling or claudication.   Last 3 blood pressure readings in our office are as follows: BP Readings from Last 3 Encounters:  09/27/17 129/83  03/23/17 104/70  02/26/17 134/83    Filed Weights   09/27/17 0902  Weight: 179 lb 3.2 oz (81.3 kg)    PreDM HPI:  -  She has been working on diet and exercise for diabetes.   Denies polyuria/polydipsia.  Denies hypo/ hyperglycemia symptoms  Last A1C in the office was:  Lab Results  Component Value Date   HGBA1C 5.7 (H) 02/18/2017    Lab Results  Component Value Date   MICROALBUR 0.20 05/13/2007   LDLCALC 132 (H) 02/18/2017   CREATININE 0.81 02/18/2017    Wt Readings from Last 3 Encounters:  09/27/17 179 lb 3.2 oz (81.3 kg)  02/26/17 208 lb 6.4 oz (94.5 kg)  10/26/16 220 lb 8 oz (100 kg)    BP Readings from Last 3 Encounters:  09/27/17 129/83  03/23/17 104/70  02/26/17 134/83     Wt Readings from Last 3 Encounters:  09/27/17 179 lb 3.2 oz (81.3 kg)  02/26/17 208 lb 6.4 oz (94.5 kg)  10/26/16 220 lb 8 oz (100 kg)   BP Readings from Last 3 Encounters:  09/27/17 129/83  03/23/17 104/70  02/26/17 134/83   Pulse Readings from Last 3 Encounters:  09/27/17 60  03/23/17 85  02/26/17 82   BMI  Readings from Last 3 Encounters:  09/27/17 31.74 kg/m  02/26/17 36.92 kg/m  10/26/16 39.06 kg/m     Patient Care Team    Relationship Specialty Notifications Start End  Mellody Dance, DO PCP - General Family Medicine  10/26/16   Roseanne Kaufman, MD Consulting Physician Orthopedic Surgery  10/26/16   Druscilla Brownie, MD Consulting Physician Dermatology  10/26/16   Drema [name], MD  Physical Medicine and Rehabilitation  10/26/16   Glenna Fellows, MD Attending Physician Neurosurgery  10/26/16   Shela Leff, MD Resident Internal Medicine  10/26/16   Armbruster, Carlota Raspberry, MD Consulting Physician Gastroenterology  02/26/17      Patient Active Problem List   Diagnosis Date Noted  . Stopped smoking with greater than 30 pack year history 02/26/2017    Priority: High  . Gastric erosions--- NSAID-related erosions 10/26/2016    Priority: High  . Pre-diabetes 10/26/2016  Priority: High  . Statin intolerance- elevated LFT's and severe myalgias 10/26/2016    Priority: High  . ANXIETY DEPRESSION 10/23/2009    Priority: High  . Hyperlipidemia 09/23/2006    Priority: High  . Essential hypertension 09/23/2006    Priority: High  . Family history of diabetes mellitus (DM)- Mom and sister 10/26/2016    Priority: Medium  . Obesity, Class II, BMI 35-39.9, with comorbidity 10/26/2016    Priority: Medium  . FIBROMYALGIA 05/16/2009    Priority: Medium  . HYSTERECTOMY, TOTAL, HX OF 09/23/2006    Priority: Medium  . Nonalcoholic fatty liver disease- elevated LFT's 02/27/2014    Priority: Low  . Gastroesophageal reflux disease without esophagitis 05/08/2013    Priority: Low  . DEGENERATIVE JOINT DISEASE 09/23/2006    Priority: Low  . Gastroesophageal reflux disease 09/27/2017  . Obesity, Class I, BMI 30-34.9 09/27/2017  . Elevated hemoglobin ([place]) 01/17/2015  . Health care maintenance 04/26/2014  . Cervicogenic headache 04/25/2012  . History of spinal surgery 05/05/2011  .  Back pain 05/05/2011    Past Medical history, Surgical history, Family history, Social history, Allergies and Medications have been entered into the medical record, reviewed and changed as needed.    Current Meds  Medication Sig  . Calcium Carbonate-Vitamin D (CALCIUM-VITAMIN D) 600-125 MG-UNIT TABS Take 2 tablets by mouth daily.   . Cinnamon 500 MG capsule Take 1,000 mg by mouth daily.  . COLLAGEN-VITAMIN C PO Take 1 tablet by mouth daily.  . diphenhydrAMINE (BENADRYL) 25 MG tablet Take 25 mg by mouth at bedtime.  . hydrochlorothiazide (HYDRODIURIL) 25 MG tablet TAKE 1 TABLET BY MOUTH ONCE DAILY  . Magnesium 125 MG CAPS Take 0.5 capsules by mouth daily.  . Melatonin 5 MG CAPS Take 1 capsule by mouth at bedtime.  . Multiple Vitamin (MULTIVITAMIN) capsule Take 1 capsule by mouth daily.    . TURMERIC PO Take 1 tablet by mouth daily.  . Vitamins-Lipotropics (BALANCED B-50 COMPLEX PO) Take 1 tablet by mouth daily.    Allergies:  Allergies  Allergen Reactions  . Beef-Derived Products Nausea And Vomiting  . Pork-Derived Products Nausea And Vomiting  . Codeine   . Doxycycline Nausea And Vomiting  . Latex     REACTION: rash  . Morphine   . Nsaids Other (See Comments)    PT has hx of ulcers.  . Penicillins   . Tramadol      Review of Systems:  A fourteen system review of systems was performed and found to be positive as per HPI.   Objective:   Blood pressure 129/83, pulse 60, height 5\' 3"  (1.6 m), weight 179 lb 3.2 oz (81.3 kg), SpO2 98 %. Body mass index is 31.74 kg/m. General:  Well Developed, well nourished, appropriate for stated age.  Neuro:  Alert and oriented,  extra-ocular muscles intact  HEENT:  Normocephalic, atraumatic, neck supple, no carotid bruits appreciated  Skin:  no gross rash, warm, pink. Cardiac:  RRR, S1 S2 Respiratory:  ECTA B/L and A/P, Not using accessory muscles, speaking in full sentences- unlabored. Vascular:  Ext warm, no cyanosis apprec.; cap RF  less 2 sec. Psych:  No HI/SI, judgement and insight good, Euthymic mood. Full Affect.

## 2017-10-06 ENCOUNTER — Ambulatory Visit (AMBULATORY_SURGERY_CENTER): Payer: Self-pay

## 2017-10-06 VITALS — Ht 63.0 in | Wt 181.0 lb

## 2017-10-06 DIAGNOSIS — Z1211 Encounter for screening for malignant neoplasm of colon: Secondary | ICD-10-CM

## 2017-10-06 MED ORDER — PEG-KCL-NACL-NASULF-NA ASC-C 140 G PO SOLR: 1.0000 | Freq: Once | ORAL | Status: AC

## 2017-10-06 NOTE — Progress Notes (Signed)
Per pt, no allergies to soy or egg products.Pt not taking any weight loss meds or using  O2 at home.  Pt refused emmi video. 

## 2017-10-08 ENCOUNTER — Encounter: Payer: Self-pay | Admitting: Gastroenterology

## 2017-10-20 ENCOUNTER — Encounter: Payer: Self-pay | Admitting: Gastroenterology

## 2017-10-20 ENCOUNTER — Other Ambulatory Visit: Payer: Self-pay

## 2017-10-20 ENCOUNTER — Ambulatory Visit (AMBULATORY_SURGERY_CENTER): Payer: Medicare HMO | Admitting: Gastroenterology

## 2017-10-20 VITALS — BP 111/61 | HR 65 | Temp 97.5°F | Resp 9 | Ht 63.0 in | Wt 179.0 lb

## 2017-10-20 DIAGNOSIS — Z1211 Encounter for screening for malignant neoplasm of colon: Secondary | ICD-10-CM | POA: Diagnosis not present

## 2017-10-20 DIAGNOSIS — I10 Essential (primary) hypertension: Secondary | ICD-10-CM | POA: Diagnosis not present

## 2017-10-20 DIAGNOSIS — D123 Benign neoplasm of transverse colon: Secondary | ICD-10-CM

## 2017-10-20 DIAGNOSIS — M797 Fibromyalgia: Secondary | ICD-10-CM | POA: Diagnosis not present

## 2017-10-20 DIAGNOSIS — D128 Benign neoplasm of rectum: Secondary | ICD-10-CM

## 2017-10-20 DIAGNOSIS — K635 Polyp of colon: Secondary | ICD-10-CM | POA: Diagnosis not present

## 2017-10-20 MED ORDER — SODIUM CHLORIDE 0.9 % IV SOLN: 500.0000 mL | Freq: Once | INTRAVENOUS | Status: DC

## 2017-10-20 NOTE — Op Note (Signed)
[place] Patient Name: Misty Decker Procedure Date: 10/20/2017 7:54 AM MRN: [id number] Endoscopist: Remo Lipps P. Francelia Mclaren MD, MD Age: 62 Referring MD:  Date of Birth: [birth year] Gender: Female Account #: [id number] Procedure:                Colonoscopy Indications:              Screening for colorectal malignant neoplasm Medicines:                Monitored Anesthesia Care Procedure:                Pre-Anesthesia Assessment:                           - Prior to the procedure, a History and Physical                            was performed, and patient medications and                            allergies were reviewed. The patient's tolerance of                            previous anesthesia was also reviewed. The risks                            and benefits of the procedure and the sedation                            options and risks were discussed with the patient.                            All questions were answered, and informed consent                            was obtained. Prior Anticoagulants: The patient has                            taken no previous anticoagulant or antiplatelet                            agents. ASA Grade Assessment: II - A patient with                            mild systemic disease. After reviewing the risks                            and benefits, the patient was deemed in                            satisfactory condition to undergo the procedure.                           After obtaining informed consent, the colonoscope  was passed under direct vision. Throughout the                            procedure, the patient's blood pressure, pulse, and                            oxygen saturations were monitored continuously. The                            Model PCF-H190DL [id number]) scope was introduced                            through the anus and advanced to the the cecum,   identified by appendiceal orifice and ileocecal                            valve. The colonoscopy was performed without                            difficulty. The patient tolerated the procedure                            well. The quality of the bowel preparation was                            good. The ileocecal valve, appendiceal orifice, and                            rectum were photographed. Scope In: 8:02:02 AM Scope Out: 8:25:04 AM Scope Withdrawal Time: 0 hours 19 minutes 53 seconds  Total Procedure Duration: 0 hours 23 minutes 2 seconds  Findings:                 Skin tags were found on perianal exam.                           Three sessile polyps were found in the transverse                            colon. The polyps were 3 to 6 mm in size. These                            polyps were removed with a cold snare. Resection                            and retrieval were complete.                           Two sessile polyps were found in the rectum. The                            polyps were 3 to 4 mm in size. These polyps were  removed with a cold snare. Resection and retrieval                            were complete.                           Internal hemorrhoids were found during                            retroflexion. The hemorrhoids were small.                           The exam was otherwise without abnormality. Complications:            No immediate complications. Estimated blood loss:                            Minimal. Estimated Blood Loss:     Estimated blood loss was minimal. Impression:               - Perianal skin tags found on perianal exam.                           - Three 3 to 6 mm polyps in the transverse colon,                            removed with a cold snare. Resected and retrieved.                           - Two 3 to 4 mm polyps in the rectum, removed with                            a cold snare. Resected and retrieved.                            - Internal hemorrhoids.                           - The examination was otherwise normal. Recommendation:           - Patient has a contact number available for                            emergencies. The signs and symptoms of potential                            delayed complications were discussed with the                            patient. Return to normal activities tomorrow.                            Written discharge instructions were provided to the                            patient.                           -  Resume previous diet.                           - Continue present medications.                           - Await pathology results.                           - Repeat colonoscopy for surveillance based on                            pathology results. Remo Lipps P. Byren Pankow MD, MD 10/20/2017 8:28:37 AM This report has been signed electronically.

## 2017-10-20 NOTE — Progress Notes (Signed)
Pt's states no medical or surgical changes since previsit or office visit.  No egg or soy allergy  

## 2017-10-20 NOTE — Progress Notes (Signed)
Called to room to assist during endoscopic procedure.  Patient ID and intended procedure confirmed with present staff. Received instructions for my participation in the procedure from the performing physician.  

## 2017-10-20 NOTE — Patient Instructions (Signed)
YOU HAD AN ENDOSCOPIC PROCEDURE TODAY AT THE [facility]:   Refer to the procedure report that was given to you for any specific questions about what was found during the examination.  If the procedure report does not answer your questions, please call your gastroenterologist to clarify.  If you requested that your care partner not be given the details of your procedure findings, then the procedure report has been included in a sealed envelope for you to review at your convenience later.  YOU SHOULD EXPECT: Some feelings of bloating in the abdomen. Passage of more gas than usual.  Walking can help get rid of the air that was put into your GI tract during the procedure and reduce the bloating. If you had a lower endoscopy (such as a colonoscopy or flexible sigmoidoscopy) you may notice spotting of blood in your stool or on the toilet paper. If you underwent a bowel prep for your procedure, you may not have a normal bowel movement for a few days.  Please Note:  You might notice some irritation and congestion in your nose or some drainage.  This is from the oxygen used during your procedure.  There is no need for concern and it should clear up in a day or so.  SYMPTOMS TO REPORT IMMEDIATELY:   Following lower endoscopy (colonoscopy or flexible sigmoidoscopy):  Excessive amounts of blood in the stool  Significant tenderness or worsening of abdominal pains  Swelling of the abdomen that is new, acute  Fever of 100F or higher  Please see handouts given to you on Polyps and Hemorrhoids.  For urgent or emergent issues, a gastroenterologist can be reached at any hour by calling [phone].   DIET:  We do recommend a small meal at first, but then you may proceed to your regular diet.  Drink plenty of fluids but you should avoid alcoholic beverages for 24 hours.  ACTIVITY:  You should plan to take it easy for the rest of today and you should NOT DRIVE or use heavy machinery until tomorrow  (because of the sedation medicines used during the test).    FOLLOW UP: Our staff will call the number listed on your records the next business day following your procedure to check on you and address any questions or concerns that you may have regarding the information given to you following your procedure. If we do not reach you, we will leave a message.  However, if you are feeling well and you are not experiencing any problems, there is no need to return our call.  We will assume that you have returned to your regular daily activities without incident.  If any biopsies were taken you will be contacted by phone or by letter within the next 1-3 weeks.  Please call us at [phone] if you have not heard about the biopsies in 3 weeks.    SIGNATURES/CONFIDENTIALITY: You and/or your care partner have signed paperwork which will be entered into your electronic medical record.  These signatures attest to the fact that that the information above on your After Visit Summary has been reviewed and is understood.  Full responsibility of the confidentiality of this discharge information lies with you and/or your care-partner.  Thank you for letting us take care of your healthcare needs today. 

## 2017-10-21 ENCOUNTER — Telehealth: Payer: Self-pay

## 2017-10-21 NOTE — Telephone Encounter (Signed)
  Follow up Call-  Call back number 10/20/2017  Post procedure Call Back phone  # [phone]  Permission to leave phone message Yes  Some recent data might be hidden     Patient questions:  Do you have a fever, pain , or abdominal swelling? No. Pain Score  0 *  Have you tolerated food without any problems? Yes.    Have you been able to return to your normal activities? Yes.    Do you have any questions about your discharge instructions: Diet   No. Medications  No. Follow up visit  No.  Do you have questions or concerns about your Care? No.  Actions: * If pain score is 4 or above: No action needed, pain <4.

## 2017-10-26 ENCOUNTER — Encounter: Payer: Self-pay | Admitting: Gastroenterology

## 2017-11-02 ENCOUNTER — Encounter: Payer: Medicare HMO | Admitting: Family Medicine

## 2017-11-12 ENCOUNTER — Ambulatory Visit (INDEPENDENT_AMBULATORY_CARE_PROVIDER_SITE_OTHER): Payer: Medicare HMO | Admitting: Family Medicine

## 2017-11-12 ENCOUNTER — Encounter: Payer: Self-pay | Admitting: Family Medicine

## 2017-11-12 ENCOUNTER — Other Ambulatory Visit (HOSPITAL_COMMUNITY)
Admission: RE | Admit: 2017-11-12 | Discharge: 2017-11-12 | Disposition: A | Discharge status: Home / Self Care | Payer: Medicare HMO | Source: Ambulatory Visit | Attending: Family Medicine | Admitting: Family Medicine

## 2017-11-12 VITALS — BP 115/72 | HR 63 | Ht 63.0 in | Wt 173.5 lb

## 2017-11-12 DIAGNOSIS — E782 Mixed hyperlipidemia: Secondary | ICD-10-CM

## 2017-11-12 DIAGNOSIS — R739 Hyperglycemia, unspecified: Secondary | ICD-10-CM | POA: Diagnosis not present

## 2017-11-12 DIAGNOSIS — Z833 Family history of diabetes mellitus: Secondary | ICD-10-CM | POA: Diagnosis not present

## 2017-11-12 DIAGNOSIS — E538 Deficiency of other specified B group vitamins: Secondary | ICD-10-CM

## 2017-11-12 DIAGNOSIS — D582 Other hemoglobinopathies: Secondary | ICD-10-CM

## 2017-11-12 DIAGNOSIS — E66811 Obesity, class 1: Secondary | ICD-10-CM

## 2017-11-12 DIAGNOSIS — I10 Essential (primary) hypertension: Secondary | ICD-10-CM | POA: Diagnosis not present

## 2017-11-12 DIAGNOSIS — Z789 Other specified health status: Secondary | ICD-10-CM

## 2017-11-12 DIAGNOSIS — K760 Fatty (change of) liver, not elsewhere classified: Secondary | ICD-10-CM

## 2017-11-12 DIAGNOSIS — Z124 Encounter for screening for malignant neoplasm of cervix: Secondary | ICD-10-CM | POA: Diagnosis present

## 2017-11-12 DIAGNOSIS — E669 Obesity, unspecified: Secondary | ICD-10-CM

## 2017-11-12 DIAGNOSIS — R7303 Prediabetes: Secondary | ICD-10-CM

## 2017-11-12 DIAGNOSIS — Z87891 Personal history of nicotine dependence: Secondary | ICD-10-CM

## 2017-11-12 DIAGNOSIS — R69 Illness, unspecified: Secondary | ICD-10-CM | POA: Diagnosis not present

## 2017-11-12 DIAGNOSIS — F341 Dysthymic disorder: Secondary | ICD-10-CM | POA: Diagnosis not present

## 2017-11-12 DIAGNOSIS — Z803 Family history of malignant neoplasm of breast: Secondary | ICD-10-CM

## 2017-11-12 NOTE — Progress Notes (Signed)
Impression and Recommendations:    1. Stopped smoking with greater than 30 pack year history   2. Statin intolerance- elevated LFT's and severe myalgias   3. Pre-diabetes   4. Mixed hyperlipidemia   5. Essential hypertension   6. ANXIETY DEPRESSION   7. Obesity, Class I, BMI 30-34.9   8. Nonalcoholic fatty liver disease- elevated LFT's   9. Family history of diabetes mellitus (DM)- Mom and sister   72. Elevated hemoglobin (HCC)   11. Elevated blood sugar level   12. Papanicolaou smear for cervical cancer screening   13. Family history of breast cancer in mother   63. Screening for cervical cancer   15. B12 deficiency      1) Anticipatory Guidance: Discussed importance of wearing a seatbelt while driving, not texting while driving; sunscreen when outside along with yearly skin surveillance; eating a well balanced and modest diet; physical activity at least 25 minutes per day or 150 min/ week of moderate to intense activity.  2) Immunizations / Screenings / Labs:  All immunizations and screenings that patient agrees to, are up-to-date per recommendations or will be updated today.  Patient understands the needs for q 46mo dental and yearly vision screens which pt will schedule independently. Obtain CBC, CMP, HgA1c, Lipid panel, TSH and vit D when fasting if not already done recently.   3) Weight:   Discussed goal of losing even 5-10% of current body weight which would improve overall feelings of well being and improve objective health data significantly.   Improve nutrient density of diet through increasing intake of fruits and vegetables and decreasing saturated/trans fats, white flour products and refined sugar products.   Stopped smoking park year >30.  -continue not smoking. No CT chest necessary at this time.  Statin intolerance Prediabetes Mixed HLD HTN -check labs today  Anxiety/depression -stable at this time. Check labs  Obesity Pt is down 62 lbs since  12/2015. Keep up the good work! Continue losing weight  -Pap smear obtained today. Will await results  -stressed the importance of yearly mammograms  -declines shingrix due to cost.   Orders Placed This Encounter  Procedures  . CBC with Differential/Platelet  . Comprehensive metabolic panel  . Hemoglobin A1c  . Lipid panel  . TSH  . VITAMIN D 25 Hydroxy (Vit-D Deficiency, Fractures)  . T4, free  . Vitamin B12    Gross side effects, risk and benefits, and alternatives of medications discussed with patient.  Patient is aware that all medications have potential side effects and we are unable to predict every side effect or drug-drug interaction that may occur.  Expresses verbal understanding and consents to current therapy plan and treatment regimen.  F-up preventative CPE in 1 year. F/up sooner for chronic care management as discussed and/or prn.  Please see orders placed and AVS handed out to patient at the end of our visit for further patient instructions/ counseling done pertaining to today's office visit.   This document serves as a record of services personally performed by Mellody Dance, DO. It was created on her behalf by Mayer Masker, a trained medical scribe. The creation of this record is based on the scribe's personal observations and the provider's statements to them.   I have reviewed the above medical documentation for accuracy and completeness and I concur.  Mellody Dance 11/14/17 10:14 PM    Subjective:    Chief Complaint  Patient presents with  . Gynecologic Exam   CC: CPE  HPI: Misty Decker is a 62 y.o. female who presents to [place] [facility] today a yearly health maintenance exam.  Health Maintenance Summary Reviewed and updated, unless pt declines services.  Colonoscopy:  Last done May 2019 UTD. Results showed polyps, repeat 5 years.  Tobacco History Reviewed:   She quit smoking in 2004. She has >30 pack year history.  She does not need CT chest. CT scan for screening lung CA:   NA- unnecessary due to 15 year smoking cessation. Abdominal Ultrasound:   NA Alcohol:    No concerns, no excessive use Exercise Habits:   Not meeting AHA guidelines STD concerns:   none Drug Use:   None Birth control method:   n/a Menses regular:     n/a Lumps or breast concerns:      no Breast Cancer Family History:      Yes- age 37s Bone/ DEXA scan:   Last done 2009  Vaccinationes Declines shingrix due to cost.  Dental Last exam several years prior- cannot go due to cost.  Vision Wears glasses, has trouble with bifocals.   Skin Dr. Allyson Sabal is his dermatologist.   GI Since taking the probiotics, it has helped her bowels some. She eats a lot of fiber and thinks probiotics work better than fiber.  She denies CP, SOB, and dizziness.   Women's health Mammogram UTD.   She had hysterectomy (they took the cervix, she has one ovary) done 30 years ago. Last pap was 10-15 years ago.   She had a hysterectomy at age 53 because "it looked like I had 10 kids and my uterus was destroyed". She denies h/o cancer or precancerous conditions that led to the surgery.   She gets medicare wellness at home yearly from her insurance. She has a nurse come out to her house who does this.    Skin She complains of itchy areas on her skin on R arm and legs. She has been using gold bond which improves her itch. She has been using OTC hydrocortisone. She does not scratch the area.   Weight 12-17-2015 she was 235 lb, she is down to 173 today.   Immunization History  Administered Date(s) Administered  . Hepatitis A, Adult 10/04/2014, 11/02/2014  . Hepatitis B, ped/adol 10/04/2014, 11/02/2014, 04/04/2015  . Influenza Split 02/27/2011, 03/22/2012  . Influenza Whole 02/23/2008, 05/16/2009, 02/19/2010  . Influenza,inj,Quad PF,6+ Mos 03/20/2013, 04/26/2014, 04/04/2015, 03/10/2016, 03/23/2017  . Pneumococcal Polysaccharide-23 01/21/2016  .  Tdap 05/05/2011    Health Maintenance  Topic Date Due  . PAP SMEAR  10/21/2020 (Originally 05/15/1977)  . INFLUENZA VACCINE  01/06/2018  . MAMMOGRAM  09/07/2019  . TETANUS/TDAP  05/04/2021  . COLONOSCOPY  10/21/2022  . Hepatitis C Screening  Completed  . HIV Screening  Completed     Wt Readings from Last 3 Encounters:  11/12/17 173 lb 8 oz (78.7 kg)  10/20/17 179 lb (81.2 kg)  10/06/17 181 lb (82.1 kg)   BP Readings from Last 3 Encounters:  11/12/17 115/72  10/20/17 111/61  09/27/17 129/83   Pulse Readings from Last 3 Encounters:  11/12/17 63  10/20/17 65  09/27/17 60     Past Medical History:  Diagnosis Date  . Complication of anesthesia    "Hard to sedate", per pt  . Degenerative joint disease    s/p cervical and lumbar fusions  . Depression   . Encounter for preventive health examination    Next mammogram in 07/2009. Next colonoscopy  in 09/2017. DEXA 4/09>Normal  . Encounter for screening colonoscopy 10/03/2007   Normal  . Fibromyalgia   . Hemorrhoid    hx of  . Hyperlipidemia   . Hypertension   . Sciatica       Past Surgical History:  Procedure Laterality Date  . CERVICAL FUSION  2010   X 2  . LUMBAR FUSION  1997   X 2/has plate and screws and cage  . OVARIAN CYST SURGERY  1980  . pelvic prolapse  1987  . RECTAL PROLAPSE REPAIR    . TONSILLECTOMY     and adenoids  . TOTAL ABDOMINAL HYSTERECTOMY  1987      Family History  Problem Relation Age of Onset  . Cancer Mother   . Diabetes Mother   . Breast cancer Mother   . Heart attack Mother   . Hyperlipidemia Mother   . Hyperlipidemia Father   . Hypertension Father   . Aneurysm Father   . Dementia Maternal Grandfather   . Cancer Paternal Grandmother        intestinal      Social History   Substance and Sexual Activity  Drug Use No  ,   Social History   Substance and Sexual Activity  Alcohol Use Not Currently  . Alcohol/week: 0.0 oz  ,   Social History   Tobacco Use    Smoking Status Former Smoker  . Packs/day: 2.00  . Years: 30.00  . Pack years: 60.00  . Types: Cigarettes  . Last attempt to quit: 04/03/2003  . Years since quitting: 14.6  Smokeless Tobacco Never Used  ,   Social History   Substance and Sexual Activity  Sexual Activity Not Currently    Current Outpatient Medications on File Prior to Visit  Medication Sig Dispense Refill  . Calcium Carbonate-Vitamin D (CALCIUM-VITAMIN D) 600-125 MG-UNIT TABS Take 2 tablets by mouth 2 (two) times daily.     . Cinnamon 500 MG capsule Take 1,000 mg by mouth daily.    . COLLAGEN-VITAMIN C PO Take 1 tablet by mouth. Take 2 pills in am and 1 pill at night    . diphenhydrAMINE (BENADRYL) 25 MG tablet Take 25 mg by mouth at bedtime.    . hydrochlorothiazide (HYDRODIURIL) 25 MG tablet TAKE 1 TABLET BY MOUTH ONCE DAILY 90 tablet 1  . Magnesium 125 MG CAPS Take by mouth daily. Take  250 mg daily    . Melatonin 5 MG CAPS Take 1 capsule by mouth at bedtime as needed.     . Multiple Vitamin (MULTIVITAMIN) capsule Take 1 capsule by mouth daily.      . Probiotic Product (PROBIOTIC DAILY PO) Take 1 tablet by mouth daily.    . psyllium (METAMUCIL) 58.6 % powder Take 1 packet by mouth. Take 1 tsp daily    . TURMERIC PO Take 1 tablet by mouth daily.    . Vitamins-Lipotropics (BALANCED B-50 COMPLEX PO) Take 1 tablet by mouth daily.     Current Facility-Administered Medications on File Prior to Visit  Medication Dose Route Frequency Provider Last Rate Last Dose  . 0.9 %  sodium chloride infusion  500 mL Intravenous Once Armbruster, Carlota Raspberry, MD        Allergies: Codeine; Morphine; Beef-derived products; Doxycycline; Ibuprofen; Latex; Nsaids; Penicillins; Pork-derived products; and Tramadol  Review of Systems: General:   Denies fever, chills, unexplained weight loss.  Optho/Auditory:   Denies visual changes, blurred vision/LOV Respiratory:   Denies SOB, DOE more than baseline  levels.  Cardiovascular:   Denies  chest pain, palpitations, new onset peripheral edema  Gastrointestinal:   Denies nausea, vomiting, diarrhea.  Genitourinary: Denies dysuria, freq/ urgency, flank pain or discharge from genitals.  Endocrine:     Denies hot or cold intolerance, polyuria, polydipsia. Musculoskeletal:   Denies unexplained myalgias, joint swelling, unexplained arthralgias, gait problems.  Skin:  Denies rash, suspicious lesions Neurological:     Denies dizziness, unexplained weakness, numbness  Psychiatric/Behavioral:   Denies mood changes, suicidal or homicidal ideations, hallucinations    Objective:    Blood pressure 115/72, pulse 63, height 5\' 3"  (1.6 m), weight 173 lb 8 oz (78.7 kg), SpO2 98 %. Body mass index is 30.73 kg/m. General Appearance:    Alert, cooperative, no distress, appears stated age  Head:    Normocephalic, without obvious abnormality, atraumatic  Eyes:    PERRL, conjunctiva/corneas clear, EOM's intact, fundi    benign, both eyes  Ears:    Normal TM's and external ear canals, both ears. Cerumen present in both ears.  Nose:   Nares normal, septum midline, mucosa normal, no drainage    or sinus tenderness  Throat:   Lips w/o lesion, mucosa moist, and tongue normal; teeth and   gums normal  Neck:   Supple, symmetrical, trachea midline, no adenopathy;    thyroid:  no enlargement/tenderness/nodules; no carotid   bruit or JVD  Back:     Symmetric, no curvature, ROM normal, no CVA tenderness  Lungs:     Clear to auscultation bilaterally, respirations unlabored, no       Wh/ R/ R  Chest Wall:    No tenderness or gross deformity; normal excursion   Heart:    Regular rate and rhythm, S1 and S2 normal, no murmur, rub   or gallop  Breast Exam:    No tenderness, masses, or nipple abnormality b/l; no d/c. Fibrous tissue.  Abdomen:     Soft, non-tender, bowel sounds active all four quadrants, NO   G/R/R, no masses, no organomegaly  Genitalia:  Tissues slightly atrophic and dry but otherwise normal.  No discreet cervix palpated, bimanuals normal, no lumps, bumps, could not appreciate any masses in the adnexal spaces. Pap smear obtained on possible remnants tissue of cervix at cervical pouch site. Ext genitalia: without lesion, no rash or discharge, No         tenderness;    Adnexa:  No tenderness or palpable masses   Rectal:    deferred  Extremities:   Extremities normal, atraumatic, no cyanosis or gross edema  Pulses:   2+ and symmetric all extremities  Skin:   Warm, dry, Skin color, texture, turgor normal, no obvious rashes or lesions Psych: No HI/SI, judgement and insight good, Euthymic mood. Full Affect.  Neurologic:   CNII-XII intact, normal strength, sensation and reflexes    Throughout

## 2017-11-12 NOTE — Patient Instructions (Addendum)
Since 7 of 2017 at which time you weighed 235 pounds you currently are down 62 pounds!    This is fabulous!  Keep up the fantastic job you are doing.  -Lenna Sciara will give you information on Shingrix vaccine so you can read about that and make sure all your other vaccinations are up-to-date.  She will provide you with the vaccine information sheet on each 1 you need and then you can check with your insurance to see how much it is.  -We will see you in 4 months.     - Preventive Care for Adults, Female  A healthy lifestyle and preventive care can promote health and wellness. Preventive health guidelines for women include the following key practices.   A routine yearly physical is a good way to check with your health care provider about your health and preventive screening. It is a chance to share any concerns and updates on your health and to receive a thorough exam.   Visit your dentist for a routine exam and preventive care every 6 months. Brush your teeth twice a day and floss once a day. Good oral hygiene prevents tooth decay and gum disease.   The frequency of eye exams is based on your age, health, family medical history, use of contact lenses, and other factors. Follow your health care provider's recommendations for frequency of eye exams.   Eat a healthy diet. Foods like vegetables, fruits, whole grains, low-fat dairy products, and lean protein foods contain the nutrients you need without too many calories. Decrease your intake of foods high in solid fats, added sugars, and salt. Eat the right amount of calories for you.Get information about a proper diet from your health care provider, if necessary.   Regular physical exercise is one of the most important things you can do for your health. Most adults should get at least 150 minutes of moderate-intensity exercise (any activity that increases your heart rate and causes you to sweat) each week. In addition, most adults need  muscle-strengthening exercises on 2 or more days a week.   Maintain a healthy weight. The body mass index (BMI) is a screening tool to identify possible weight problems. It provides an estimate of body fat based on height and weight. Your health care provider can find your BMI, and can help you achieve or maintain a healthy weight.For adults 20 years and older:   - A BMI below 18.5 is considered underweight.   - A BMI of 18.5 to 24.9 is normal.   - A BMI of 25 to 29.9 is considered overweight.   - A BMI of 30 and above is considered obese.   Maintain normal blood lipids and cholesterol levels by exercising and minimizing your intake of trans and saturated fats.  Eat a balanced diet with plenty of fruit and vegetables. Blood tests for lipids and cholesterol should begin at age 80 and be repeated every 5 years minimum.  If your lipid or cholesterol levels are high, you are over 40, or you are at high risk for heart disease, you may need your cholesterol levels checked more frequently.Ongoing high lipid and cholesterol levels should be treated with medicines if diet and exercise are not working.   If you smoke, find out from your health care provider how to quit. If you do not use tobacco, do not start.   Lung cancer screening is recommended for adults aged 92-80 years who are at high risk for developing lung cancer because of a  history of smoking. A yearly low-dose CT scan of the lungs is recommended for people who have at least a 30-pack-year history of smoking and are a current smoker or have quit within the past 15 years. A pack year of smoking is smoking an average of 1 pack of cigarettes a day for 1 year (for example: 1 pack a day for 30 years or 2 packs a day for 15 years). Yearly screening should continue until the smoker has stopped smoking for at least 15 years. Yearly screening should be stopped for people who develop a health problem that would prevent them from having lung cancer  treatment.   If you are pregnant, do not drink alcohol. If you are breastfeeding, be very cautious about drinking alcohol. If you are not pregnant and choose to drink alcohol, do not have more than 1 drink per day. One drink is considered to be 12 ounces (355 mL) of beer, 5 ounces (148 mL) of wine, or 1.5 ounces (44 mL) of liquor.   Avoid use of street drugs. Do not share needles with anyone. Ask for help if you need support or instructions about stopping the use of drugs.   High blood pressure causes heart disease and increases the risk of stroke. Your blood pressure should be checked at least yearly.  Ongoing high blood pressure should be treated with medicines if weight loss and exercise do not work.   If you are 23-60 years old, ask your health care provider if you should take aspirin to prevent strokes.   Diabetes screening involves taking a blood sample to check your fasting blood sugar level. This should be done once every 3 years, after age 59, if you are within normal weight and without risk factors for diabetes. Testing should be considered at a younger age or be carried out more frequently if you are overweight and have at least 1 risk factor for diabetes.   Breast cancer screening is essential preventive care for women. You should practice "breast self-awareness."  This means understanding the normal appearance and feel of your breasts and may include breast self-examination.  Any changes detected, no matter how small, should be reported to a health care provider.  Women in their 60s and 30s should have a clinical breast exam (CBE) by a health care provider as part of a regular health exam every 1 to 3 years.  After age 36, women should have a CBE every year.  Starting at age 77, women should consider having a mammogram (breast X-ray test) every year.  Women who have a family history of breast cancer should talk to their health care provider about genetic screening.  Women at a high  risk of breast cancer should talk to their health care providers about having an MRI and a mammogram every year.   -Breast cancer gene (BRCA)-related cancer risk assessment is recommended for women who have family members with BRCA-related cancers. BRCA-related cancers include breast, ovarian, tubal, and peritoneal cancers. Having family members with these cancers may be associated with an increased risk for harmful changes (mutations) in the breast cancer genes BRCA1 and BRCA2. Results of the assessment will determine the need for genetic counseling and BRCA1 and BRCA2 testing.   The Pap test is a screening test for cervical cancer. A Pap test can show cell changes on the cervix that might become cervical cancer if left untreated. A Pap test is a procedure in which cells are obtained and examined from the lower end  of the uterus (cervix).   - Women should have a Pap test starting at age 19.   - Between ages 26 and 34, Pap tests should be repeated every 2 years.   - Beginning at age 21, you should have a Pap test every 3 years as long as the past 3 Pap tests have been normal.   - Some women have medical problems that increase the chance of getting cervical cancer. Talk to your health care provider about these problems. It is especially important to talk to your health care provider if a new problem develops soon after your last Pap test. In these cases, your health care provider may recommend more frequent screening and Pap tests.   - The above recommendations are the same for women who have or have not gotten the vaccine for human papillomavirus (HPV).   - If you had a hysterectomy for a problem that was not cancer or a condition that could lead to cancer, then you no longer need Pap tests. Even if you no longer need a Pap test, a regular exam is a good idea to make sure no other problems are starting.   - If you are between ages 35 and 36 years, and you have had normal Pap tests going back 10  years, you no longer need Pap tests. Even if you no longer need a Pap test, a regular exam is a good idea to make sure no other problems are starting.   - If you have had past treatment for cervical cancer or a condition that could lead to cancer, you need Pap tests and screening for cancer for at least 20 years after your treatment.   - If Pap tests have been discontinued, risk factors (such as a new sexual partner) need to be reassessed to determine if screening should be resumed.   - The HPV test is an additional test that may be used for cervical cancer screening. The HPV test looks for the virus that can cause the cell changes on the cervix. The cells collected during the Pap test can be tested for HPV. The HPV test could be used to screen women aged 65 years and older, and should be used in women of any age who have unclear Pap test results. After the age of 52, women should have HPV testing at the same frequency as a Pap test.   Colorectal cancer can be detected and often prevented. Most routine colorectal cancer screening begins at the age of 47 years and continues through age 49 years. However, your health care provider may recommend screening at an earlier age if you have risk factors for colon cancer. On a yearly basis, your health care provider may provide home test kits to check for hidden blood in the stool.  Use of a small camera at the end of a tube, to directly examine the colon (sigmoidoscopy or colonoscopy), can detect the earliest forms of colorectal cancer. Talk to your health care provider about this at age 32, when routine screening begins. Direct exam of the colon should be repeated every 5 -10 years through age 49 years, unless early forms of pre-cancerous polyps or small growths are found.   People who are at an increased risk for hepatitis B should be screened for this virus. You are considered at high risk for hepatitis B if:  -You were born in a country where hepatitis B  occurs often. Talk with your health care provider about which countries are  considered high risk.  - Your parents were born in a high-risk country and you have not received a shot to protect against hepatitis B (hepatitis B vaccine).  - You have HIV or AIDS.  - You use needles to inject street drugs.  - You live with, or have sex with, someone who has Hepatitis B.  - You get hemodialysis treatment.  - You take certain medicines for conditions like cancer, organ transplantation, and autoimmune conditions.   Hepatitis C blood testing is recommended for all people born from [birth year] through 1965 and any individual with known risks for hepatitis C.   Practice safe sex. Use condoms and avoid high-risk sexual practices to reduce the spread of sexually transmitted infections (STIs). STIs include gonorrhea, chlamydia, syphilis, trichomonas, herpes, HPV, and human immunodeficiency virus (HIV). Herpes, HIV, and HPV are viral illnesses that have no cure. They can result in disability, cancer, and death. Sexually active women aged 37 years and younger should be checked for chlamydia. Older women with new or multiple partners should also be tested for chlamydia. Testing for other STIs is recommended if you are sexually active and at increased risk.   Osteoporosis is a disease in which the bones lose minerals and strength with aging. This can result in serious bone fractures or breaks. The risk of osteoporosis can be identified using a bone density scan. Women ages 69 years and over and women at risk for fractures or osteoporosis should discuss screening with their health care providers. Ask your health care provider whether you should take a calcium supplement or vitamin D to There are also several preventive steps women can take to avoid osteoporosis and resulting fractures or to keep osteoporosis from worsening. -->Recommendations include:  Eat a balanced diet high in fruits, vegetables, calcium, and  vitamins.  Get enough calcium. The recommended total intake of is 1,200 mg daily; for best absorption, if taking supplements, divide doses into 250-500 mg doses throughout the day. Of the two types of calcium, calcium carbonate is best absorbed when taken with food but calcium citrate can be taken on an empty stomach.  Get enough vitamin D. [employer] and the [employer] recommend at least 1,000 IU per day for women age 41 and over who are at risk of vitamin D deficiency. Vitamin D deficiency can be caused by inadequate sun exposure (for example, those who live in [place]).  Avoid alcohol and smoking. Heavy alcohol intake (more than 7 drinks per week) increases the risk of falls and hip fracture and women smokers tend to lose bone more rapidly and have lower bone mass than nonsmokers. Stopping smoking is one of the most important changes women can make to improve their health and decrease risk for disease.  Be physically active every day. Weight-bearing exercise (for example, fast walking, hiking, jogging, and weight training) may strengthen bones or slow the rate of bone loss that comes with aging. Balancing and muscle-strengthening exercises can reduce the risk of falling and fracture.  Consider therapeutic medications. Currently, several types of effective drugs are available. Healthcare providers can recommend the type most appropriate for each woman.  Eliminate environmental factors that may contribute to accidents. Falls cause nearly 90% of all osteoporotic fractures, so reducing this risk is an important bone-health strategy. Measures include ample lighting, removing obstructions to walking, using nonskid rugs on floors, and placing mats and/or grab bars in showers.  Be aware of medication side effects. Some common medicines make bones weaker. These include a  type of steroid drug called glucocorticoids used for arthritis and asthma, some antiseizure drugs, certain  sleeping pills, treatments for endometriosis, and some cancer drugs. An overactive thyroid gland or using too much thyroid hormone for an underactive thyroid can also be a problem. If you are taking these medicines, talk to your doctor about what you can do to help protect your bones.reduce the rate of osteoporosis.    Menopause can be associated with physical symptoms and risks. Hormone replacement therapy is available to decrease symptoms and risks. You should talk to your health care provider about whether hormone replacement therapy is right for you.   Use sunscreen. Apply sunscreen liberally and repeatedly throughout the day. You should seek shade when your shadow is shorter than you. Protect yourself by wearing long sleeves, pants, a wide-brimmed hat, and sunglasses year round, whenever you are outdoors.   Once a month, do a whole body skin exam, using a mirror to look at the skin on your back. Tell your health care provider of new moles, moles that have irregular borders, moles that are larger than a pencil eraser, or moles that have changed in shape or color.   -Stay current with required vaccines (immunizations).   Influenza vaccine. All adults should be immunized every year.  Tetanus, diphtheria, and acellular pertussis (Td, Tdap) vaccine. Pregnant women should receive 1 dose of Tdap vaccine during each pregnancy. The dose should be obtained regardless of the length of time since the last dose. Immunization is preferred during the 27th 36th week of gestation. An adult who has not previously received Tdap or who does not know her vaccine status should receive 1 dose of Tdap. This initial dose should be followed by tetanus and diphtheria toxoids (Td) booster doses every 10 years. Adults with an unknown or incomplete history of completing a 3-dose immunization series with Td-containing vaccines should begin or complete a primary immunization series including a Tdap dose. Adults should  receive a Td booster every 10 years.  Varicella vaccine. An adult without evidence of immunity to varicella should receive 2 doses or a second dose if she has previously received 1 dose. Pregnant females who do not have evidence of immunity should receive the first dose after pregnancy. This first dose should be obtained before leaving the health care facility. The second dose should be obtained 4 8 weeks after the first dose.  Human papillomavirus (HPV) vaccine. Females aged 40 26 years who have not received the vaccine previously should obtain the 3-dose series. The vaccine is not recommended for use in pregnant females. However, pregnancy testing is not needed before receiving a dose. If a female is found to be pregnant after receiving a dose, no treatment is needed. In that case, the remaining doses should be delayed until after the pregnancy. Immunization is recommended for any person with an immunocompromised condition through the age of 6 years if she did not get any or all doses earlier. During the 3-dose series, the second dose should be obtained 4 8 weeks after the first dose. The third dose should be obtained 24 weeks after the first dose and 16 weeks after the second dose.  Zoster vaccine. One dose is recommended for adults aged 59 years or older unless certain conditions are present.  Measles, mumps, and rubella (MMR) vaccine. Adults born before 65 generally are considered immune to measles and mumps. Adults born in [birth year] or later should have 1 or more doses of MMR vaccine unless there is a contraindication  to the vaccine or there is laboratory evidence of immunity to each of the three diseases. A routine second dose of MMR vaccine should be obtained at least 28 days after the first dose for students attending postsecondary schools, health care workers, or international travelers. People who received inactivated measles vaccine or an unknown type of measles vaccine during 1963 1967 should  receive 2 doses of MMR vaccine. People who received inactivated mumps vaccine or an unknown type of mumps vaccine before 1979 and are at high risk for mumps infection should consider immunization with 2 doses of MMR vaccine. For females of childbearing age, rubella immunity should be determined. If there is no evidence of immunity, females who are not pregnant should be vaccinated. If there is no evidence of immunity, females who are pregnant should delay immunization until after pregnancy. Unvaccinated health care workers born before [birth year] who lack laboratory evidence of measles, mumps, or rubella immunity or laboratory confirmation of disease should consider measles and mumps immunization with 2 doses of MMR vaccine or rubella immunization with 1 dose of MMR vaccine.  Pneumococcal 13-valent conjugate (PCV13) vaccine. When indicated, a person who is uncertain of her immunization history and has no record of immunization should receive the PCV13 vaccine. An adult aged 65 years or older who has certain medical conditions and has not been previously immunized should receive 1 dose of PCV13 vaccine. This PCV13 should be followed with a dose of pneumococcal polysaccharide (PPSV23) vaccine. The PPSV23 vaccine dose should be obtained at least 8 weeks after the dose of PCV13 vaccine. An adult aged 6 years or older who has certain medical conditions and previously received 1 or more doses of PPSV23 vaccine should receive 1 dose of PCV13. The PCV13 vaccine dose should be obtained 1 or more years after the last PPSV23 vaccine dose.  Pneumococcal polysaccharide (PPSV23) vaccine. When PCV13 is also indicated, PCV13 should be obtained first. All adults aged 37 years and older should be immunized. An adult younger than age 16 years who has certain medical conditions should be immunized. Any person who resides in a nursing home or long-term care facility should be immunized. An adult smoker should be immunized. People with an  immunocompromised condition and certain other conditions should receive both PCV13 and PPSV23 vaccines. People with human immunodeficiency virus (HIV) infection should be immunized as soon as possible after diagnosis. Immunization during chemotherapy or radiation therapy should be avoided. Routine use of PPSV23 vaccine is not recommended for American Indians, [place] Natives, or people younger than 65 years unless there are medical conditions that require PPSV23 vaccine. When indicated, people who have unknown immunization and have no record of immunization should receive PPSV23 vaccine. One-time revaccination 5 years after the first dose of PPSV23 is recommended for people aged 6 64 years who have chronic kidney failure, nephrotic syndrome, asplenia, or immunocompromised conditions. People who received 1 2 doses of PPSV23 before age 71 years should receive another dose of PPSV23 vaccine at age 12 years or later if at least 5 years have passed since the previous dose. Doses of PPSV23 are not needed for people immunized with PPSV23 at or after age 44 years.  Meningococcal vaccine. Adults with asplenia or persistent complement component deficiencies should receive 2 doses of quadrivalent meningococcal conjugate (MenACWY-D) vaccine. The doses should be obtained at least 2 months apart. Microbiologists working with certain meningococcal bacteria, [place] recruits, people at risk during an outbreak, and people who travel to or live in countries with a  high rate of meningitis should be immunized. A first-year college student up through age 61 years who is living in a residence hall should receive a dose if she did not receive a dose on or after her 16th birthday. Adults who have certain high-risk conditions should receive one or more doses of vaccine.  Hepatitis A vaccine. Adults who wish to be protected from this disease, have certain high-risk conditions, work with hepatitis A-infected animals, work in hepatitis A  research labs, or travel to or work in countries with a high rate of hepatitis A should be immunized. Adults who were previously unvaccinated and who anticipate close contact with an international adoptee during the first 60 days after arrival in the [place] from a country with a high rate of hepatitis A should be immunized.  Hepatitis B vaccine.  Adults who wish to be protected from this disease, have certain high-risk conditions, may be exposed to blood or other infectious body fluids, are household contacts or sex partners of hepatitis B positive people, are clients or workers in certain care facilities, or travel to or work in countries with a high rate of hepatitis B should be immunized.  Haemophilus influenzae type b (Hib) vaccine. A previously unvaccinated person with asplenia or sickle cell disease or having a scheduled splenectomy should receive 1 dose of Hib vaccine. Regardless of previous immunization, a recipient of a hematopoietic stem cell transplant should receive a 3-dose series 6 12 months after her successful transplant. Hib vaccine is not recommended for adults with HIV infection.  Preventive Services / Frequency Ages 2 to 39years  Blood pressure check.** / Every 1 to 2 years.  Lipid and cholesterol check.** / Every 5 years beginning at age 37.  Clinical breast exam.** / Every 3 years for women in their 33s and 67s.  BRCA-related cancer risk assessment.** / For women who have family members with a BRCA-related cancer (breast, ovarian, tubal, or peritoneal cancers).  Pap test.** / Every 2 years from ages 83 through 61. Every 3 years starting at age 20 through age 62 or 52 with a history of 3 consecutive normal Pap tests.  HPV screening.** / Every 3 years from ages 72 through ages 34 to 76 with a history of 3 consecutive normal Pap tests.  Hepatitis C blood test.** / For any individual with known risks for hepatitis C.  Skin self-exam. / Monthly.  Influenza vaccine. /  Every year.  Tetanus, diphtheria, and acellular pertussis (Tdap, Td) vaccine.** / Consult your health care provider. Pregnant women should receive 1 dose of Tdap vaccine during each pregnancy. 1 dose of Td every 10 years.  Varicella vaccine.** / Consult your health care provider. Pregnant females who do not have evidence of immunity should receive the first dose after pregnancy.  HPV vaccine. / 3 doses over 6 months, if 61 and younger. The vaccine is not recommended for use in pregnant females. However, pregnancy testing is not needed before receiving a dose.  Measles, mumps, rubella (MMR) vaccine.** / You need at least 1 dose of MMR if you were born in 1957 or later. You may also need a 2nd dose. For females of childbearing age, rubella immunity should be determined. If there is no evidence of immunity, females who are not pregnant should be vaccinated. If there is no evidence of immunity, females who are pregnant should delay immunization until after pregnancy.  Pneumococcal 13-valent conjugate (PCV13) vaccine.** / Consult your health care provider.  Pneumococcal polysaccharide (PPSV23) vaccine.** /  1 to 2 doses if you smoke cigarettes or if you have certain conditions.  Meningococcal vaccine.** / 1 dose if you are age 29 to 63 years and a Market researcher living in a residence hall, or have one of several medical conditions, you need to get vaccinated against meningococcal disease. You may also need additional booster doses.  Hepatitis A vaccine.** / Consult your health care provider.  Hepatitis B vaccine.** / Consult your health care provider.  Haemophilus influenzae type b (Hib) vaccine.** / Consult your health care provider.  Ages 19 to 64years  Blood pressure check.** / Every 1 to 2 years.  Lipid and cholesterol check.** / Every 5 years beginning at age 30 years.  Lung cancer screening. / Every year if you are aged 19 80 years and have a 30-pack-year history of smoking  and currently smoke or have quit within the past 15 years. Yearly screening is stopped once you have quit smoking for at least 15 years or develop a health problem that would prevent you from having lung cancer treatment.  Clinical breast exam.** / Every year after age 76 years.  BRCA-related cancer risk assessment.** / For women who have family members with a BRCA-related cancer (breast, ovarian, tubal, or peritoneal cancers).  Mammogram.** / Every year beginning at age 56 years and continuing for as long as you are in good health. Consult with your health care provider.  Pap test.** / Every 3 years starting at age 41 years through age 47 or 53 years with a history of 3 consecutive normal Pap tests.  HPV screening.** / Every 3 years from ages 13 years through ages 3 to 53 years with a history of 3 consecutive normal Pap tests.  Fecal occult blood test (FOBT) of stool. / Every year beginning at age 90 years and continuing until age 43 years. You may not need to do this test if you get a colonoscopy every 10 years.  Flexible sigmoidoscopy or colonoscopy.** / Every 5 years for a flexible sigmoidoscopy or every 10 years for a colonoscopy beginning at age 47 years and continuing until age 26 years.  Hepatitis C blood test.** / For all people born from [birth year] through 1965 and any individual with known risks for hepatitis C.  Skin self-exam. / Monthly.  Influenza vaccine. / Every year.  Tetanus, diphtheria, and acellular pertussis (Tdap/Td) vaccine.** / Consult your health care provider. Pregnant women should receive 1 dose of Tdap vaccine during each pregnancy. 1 dose of Td every 10 years.  Varicella vaccine.** / Consult your health care provider. Pregnant females who do not have evidence of immunity should receive the first dose after pregnancy.  Zoster vaccine.** / 1 dose for adults aged 58 years or older.  Measles, mumps, rubella (MMR) vaccine.** / You need at least 1 dose of MMR if you  were born in 1957 or later. You may also need a 2nd dose. For females of childbearing age, rubella immunity should be determined. If there is no evidence of immunity, females who are not pregnant should be vaccinated. If there is no evidence of immunity, females who are pregnant should delay immunization until after pregnancy.  Pneumococcal 13-valent conjugate (PCV13) vaccine.** / Consult your health care provider.  Pneumococcal polysaccharide (PPSV23) vaccine.** / 1 to 2 doses if you smoke cigarettes or if you have certain conditions.  Meningococcal vaccine.** / Consult your health care provider.  Hepatitis A vaccine.** / Consult your health care provider.  Hepatitis B vaccine.** / Consult  your health care provider.  Haemophilus influenzae type b (Hib) vaccine.** / Consult your health care provider.  Ages 73 years and over  Blood pressure check.** / Every 1 to 2 years.  Lipid and cholesterol check.** / Every 5 years beginning at age 28 years.  Lung cancer screening. / Every year if you are aged 73 80 years and have a 30-pack-year history of smoking and currently smoke or have quit within the past 15 years. Yearly screening is stopped once you have quit smoking for at least 15 years or develop a health problem that would prevent you from having lung cancer treatment.  Clinical breast exam.** / Every year after age 38 years.  BRCA-related cancer risk assessment.** / For women who have family members with a BRCA-related cancer (breast, ovarian, tubal, or peritoneal cancers).  Mammogram.** / Every year beginning at age 10 years and continuing for as long as you are in good health. Consult with your health care provider.  Pap test.** / Every 3 years starting at age 15 years through age 28 or 63 years with 3 consecutive normal Pap tests. Testing can be stopped between 65 and 70 years with 3 consecutive normal Pap tests and no abnormal Pap or HPV tests in the past 10 years.  HPV screening.**  / Every 3 years from ages 28 years through ages 61 or 23 years with a history of 3 consecutive normal Pap tests. Testing can be stopped between 65 and 70 years with 3 consecutive normal Pap tests and no abnormal Pap or HPV tests in the past 10 years.  Fecal occult blood test (FOBT) of stool. / Every year beginning at age 28 years and continuing until age 40 years. You may not need to do this test if you get a colonoscopy every 10 years.  Flexible sigmoidoscopy or colonoscopy.** / Every 5 years for a flexible sigmoidoscopy or every 10 years for a colonoscopy beginning at age 71 years and continuing until age 35 years.  Hepatitis C blood test.** / For all people born from [birth year] through 1965 and any individual with known risks for hepatitis C.  Osteoporosis screening.** / A one-time screening for women ages 36 years and over and women at risk for fractures or osteoporosis.  Skin self-exam. / Monthly.  Influenza vaccine. / Every year.  Tetanus, diphtheria, and acellular pertussis (Tdap/Td) vaccine.** / 1 dose of Td every 10 years.  Varicella vaccine.** / Consult your health care provider.  Zoster vaccine.** / 1 dose for adults aged 28 years or older.  Pneumococcal 13-valent conjugate (PCV13) vaccine.** / Consult your health care provider.  Pneumococcal polysaccharide (PPSV23) vaccine.** / 1 dose for all adults aged 67 years and older.  Meningococcal vaccine.** / Consult your health care provider.  Hepatitis A vaccine.** / Consult your health care provider.  Hepatitis B vaccine.** / Consult your health care provider.  Haemophilus influenzae type b (Hib) vaccine.** / Consult your health care provider. ** Family history and personal history of risk and conditions may change your health care provider's recommendations. Document Released: 07/21/2001 Document Revised: 03/15/2013  [facility] Patient Information 2014 [place], [place].   EXERCISE AND DIET:  We recommended that you start or  continue a regular exercise program for good health. Regular exercise means any activity that makes your heart beat faster and makes you sweat.  We recommend exercising at least 30 minutes per day at least 3 days a week, preferably 5.  We also recommend a diet low in fat and sugar /  carbohydrates.  Inactivity, poor dietary choices and obesity can cause diabetes, heart attack, stroke, and kidney damage, among others.     ALCOHOL AND SMOKING:  Women should limit their alcohol intake to no more than 7 drinks/beers/glasses of wine (combined, not each!) per week. Moderation of alcohol intake to this level decreases your risk of breast cancer and liver damage.  ( And of course, no recreational drugs are part of a healthy lifestyle.)  Also, you should not be smoking at all or even being exposed to second hand smoke. Most people know smoking can cause cancer, and various heart and lung diseases, but did you know it also contributes to weakening of your bones?  Aging of your skin?  Yellowing of your teeth and nails?   CALCIUM AND VITAMIN D:  Adequate intake of calcium and Vitamin D are recommended.  The recommendations for exact amounts of these supplements seem to change often, but generally speaking 600 mg of calcium (either carbonate or citrate) and 800 units of Vitamin D per day seems prudent. Certain women may benefit from higher intake of Vitamin D.  If you are among these women, your doctor will have told you during your visit.     PAP SMEARS:  Pap smears, to check for cervical cancer or precancers,  have traditionally been done yearly, although recent scientific advances have shown that most women can have pap smears less often.  However, every woman still should have a physical exam from her gynecologist or primary care physician every year. It will include a breast check, inspection of the vulva and vagina to check for abnormal growths or skin changes, a visual exam of the cervix, and then an exam to  evaluate the size and shape of the uterus and ovaries.  And after 62 years of age, a rectal exam is indicated to check for rectal cancers. We will also provide age appropriate advice regarding health maintenance, like when you should have certain vaccines, screening for sexually transmitted diseases, bone density testing, colonoscopy, mammograms, etc.    MAMMOGRAMS:  All women over 52 years old should have a yearly mammogram. Many facilities now offer a "3D" mammogram, which may cost around $50 extra out of pocket. If possible,  we recommend you accept the option to have the 3D mammogram performed.  It both reduces the number of women who will be called back for extra views which then turn out to be normal, and it is better than the routine mammogram at detecting truly abnormal areas.     COLONOSCOPY:  Colonoscopy to screen for colon cancer is recommended for all women at age 53.  We know, you hate the idea of the prep.  We agree, BUT, having colon cancer and not knowing it is worse!!  Colon cancer so often starts as a polyp that can be seen and removed at colonscopy, which can quite literally save your life!  And if your first colonoscopy is normal and you have no family history of colon cancer, most women don't have to have it again for 10 years.  Once every ten years, you can do something that may end up saving your life, right?  We will be happy to help you get it scheduled when you are ready.  Be sure to check your insurance coverage so you understand how much it will cost.  It may be covered as a preventative service at no cost, but you should check your particular policy.

## 2017-11-13 LAB — COMPREHENSIVE METABOLIC PANEL
ALBUMIN: 4.6 g/dL (ref 3.6–4.8)
ALK PHOS: 90 IU/L (ref 39–117)
ALT: 18 IU/L (ref 0–32)
AST: 26 IU/L (ref 0–40)
Albumin/Globulin Ratio: 1.8 (ref 1.2–2.2)
BUN / CREAT RATIO: 19 (ref 12–28)
BUN: 15 mg/dL (ref 8–27)
Bilirubin Total: 0.7 mg/dL (ref 0.0–1.2)
CHLORIDE: 104 mmol/L (ref 96–106)
CO2: 24 mmol/L (ref 20–29)
Calcium: 9.8 mg/dL (ref 8.7–10.3)
Creatinine, Ser: 0.8 mg/dL (ref 0.57–1.00)
GFR calc Af Amer: 92 mL/min/{1.73_m2} (ref >59)
GFR calc non Af Amer: 80 mL/min/{1.73_m2} (ref >59)
GLOBULIN, TOTAL: 2.5 g/dL (ref 1.5–4.5)
GLUCOSE: 102 mg/dL — AB (ref 65–99)
Potassium: 4.4 mmol/L (ref 3.5–5.2)
SODIUM: 143 mmol/L (ref 134–144)
Total Protein: 7.1 g/dL (ref 6.0–8.5)

## 2017-11-13 LAB — CBC WITH DIFFERENTIAL/PLATELET
BASOS ABS: 0 10*3/uL (ref 0.0–0.2)
Basos: 1 %
EOS (ABSOLUTE): 0.2 10*3/uL (ref 0.0–0.4)
Eos: 4 %
HEMATOCRIT: 46.5 % (ref 34.0–46.6)
HEMOGLOBIN: 16.1 g/dL — AB (ref 11.1–15.9)
Immature Grans (Abs): 0 10*3/uL (ref 0.0–0.1)
Immature Granulocytes: 0 %
Lymphocytes Absolute: 1.8 10*3/uL (ref 0.7–3.1)
Lymphs: 32 %
MCH: 33.4 pg — AB (ref 26.6–33.0)
MCHC: 34.6 g/dL (ref 31.5–35.7)
MCV: 97 fL (ref 79–97)
MONOCYTES: 7 %
MONOS ABS: 0.4 10*3/uL (ref 0.1–0.9)
NEUTROS ABS: 3.3 10*3/uL (ref 1.4–7.0)
Neutrophils: 56 %
Platelets: 228 10*3/uL (ref 150–450)
RBC: 4.82 x10E6/uL (ref 3.77–5.28)
RDW: 13.5 % (ref 12.3–15.4)
WBC: 5.8 10*3/uL (ref 3.4–10.8)

## 2017-11-13 LAB — LIPID PANEL
CHOL/HDL RATIO: 3.6 ratio (ref 0.0–4.4)
Cholesterol, Total: 181 mg/dL (ref 100–199)
HDL: 50 mg/dL (ref >39)
LDL Calculated: 106 mg/dL — ABNORMAL HIGH (ref 0–99)
Triglycerides: 125 mg/dL (ref 0–149)
VLDL Cholesterol Cal: 25 mg/dL (ref 5–40)

## 2017-11-13 LAB — VITAMIN D 25 HYDROXY (VIT D DEFICIENCY, FRACTURES): VIT D 25 HYDROXY: 61 ng/mL (ref 30.0–100.0)

## 2017-11-13 LAB — HEMOGLOBIN A1C
Est. average glucose Bld gHb Est-mCnc: 108 mg/dL
HEMOGLOBIN A1C: 5.4 % (ref 4.8–5.6)

## 2017-11-13 LAB — TSH: TSH: 2.57 u[IU]/mL (ref 0.450–4.500)

## 2017-11-13 LAB — VITAMIN B12: Vitamin B-12: 683 pg/mL (ref 232–1245)

## 2017-11-13 LAB — T4, FREE: Free T4: 1.1 ng/dL (ref 0.82–1.77)

## 2017-11-15 LAB — CYTOLOGY - PAP
Diagnosis: NEGATIVE
HPV: NOT DETECTED

## 2017-12-15 DIAGNOSIS — M4316 Spondylolisthesis, lumbar region: Secondary | ICD-10-CM | POA: Insufficient documentation

## 2017-12-27 ENCOUNTER — Other Ambulatory Visit: Payer: Self-pay | Admitting: Family Medicine

## 2018-01-06 DIAGNOSIS — Z6830 Body mass index (BMI) 30.0-30.9, adult: Secondary | ICD-10-CM | POA: Diagnosis not present

## 2018-01-06 DIAGNOSIS — G8929 Other chronic pain: Secondary | ICD-10-CM | POA: Diagnosis not present

## 2018-01-06 DIAGNOSIS — Z823 Family history of stroke: Secondary | ICD-10-CM | POA: Diagnosis not present

## 2018-01-06 DIAGNOSIS — M792 Neuralgia and neuritis, unspecified: Secondary | ICD-10-CM | POA: Diagnosis not present

## 2018-01-06 DIAGNOSIS — K5900 Constipation, unspecified: Secondary | ICD-10-CM | POA: Diagnosis not present

## 2018-01-06 DIAGNOSIS — Z8249 Family history of ischemic heart disease and other diseases of the circulatory system: Secondary | ICD-10-CM | POA: Diagnosis not present

## 2018-01-06 DIAGNOSIS — I10 Essential (primary) hypertension: Secondary | ICD-10-CM | POA: Diagnosis not present

## 2018-01-06 DIAGNOSIS — E669 Obesity, unspecified: Secondary | ICD-10-CM | POA: Diagnosis not present

## 2018-01-06 DIAGNOSIS — G4700 Insomnia, unspecified: Secondary | ICD-10-CM | POA: Diagnosis not present

## 2018-01-06 DIAGNOSIS — Z803 Family history of malignant neoplasm of breast: Secondary | ICD-10-CM | POA: Diagnosis not present

## 2018-03-15 ENCOUNTER — Ambulatory Visit: Payer: Medicare HMO | Admitting: Adult Health

## 2018-03-22 IMAGING — CR DG HAND COMPLETE 3+V*R*
3 series · 3 of 3 positions shown · non-contrast
Comparison: Wrist films same date

CLINICAL DATA: Initial encounter for Patient states she fell on
wrist. Bruising and swelling by 5th digit and wrist area. Best
possible film on navicular patient unable to move wrist

EXAM:
RIGHT HAND - COMPLETE 3+ VIEW

[hand pa]
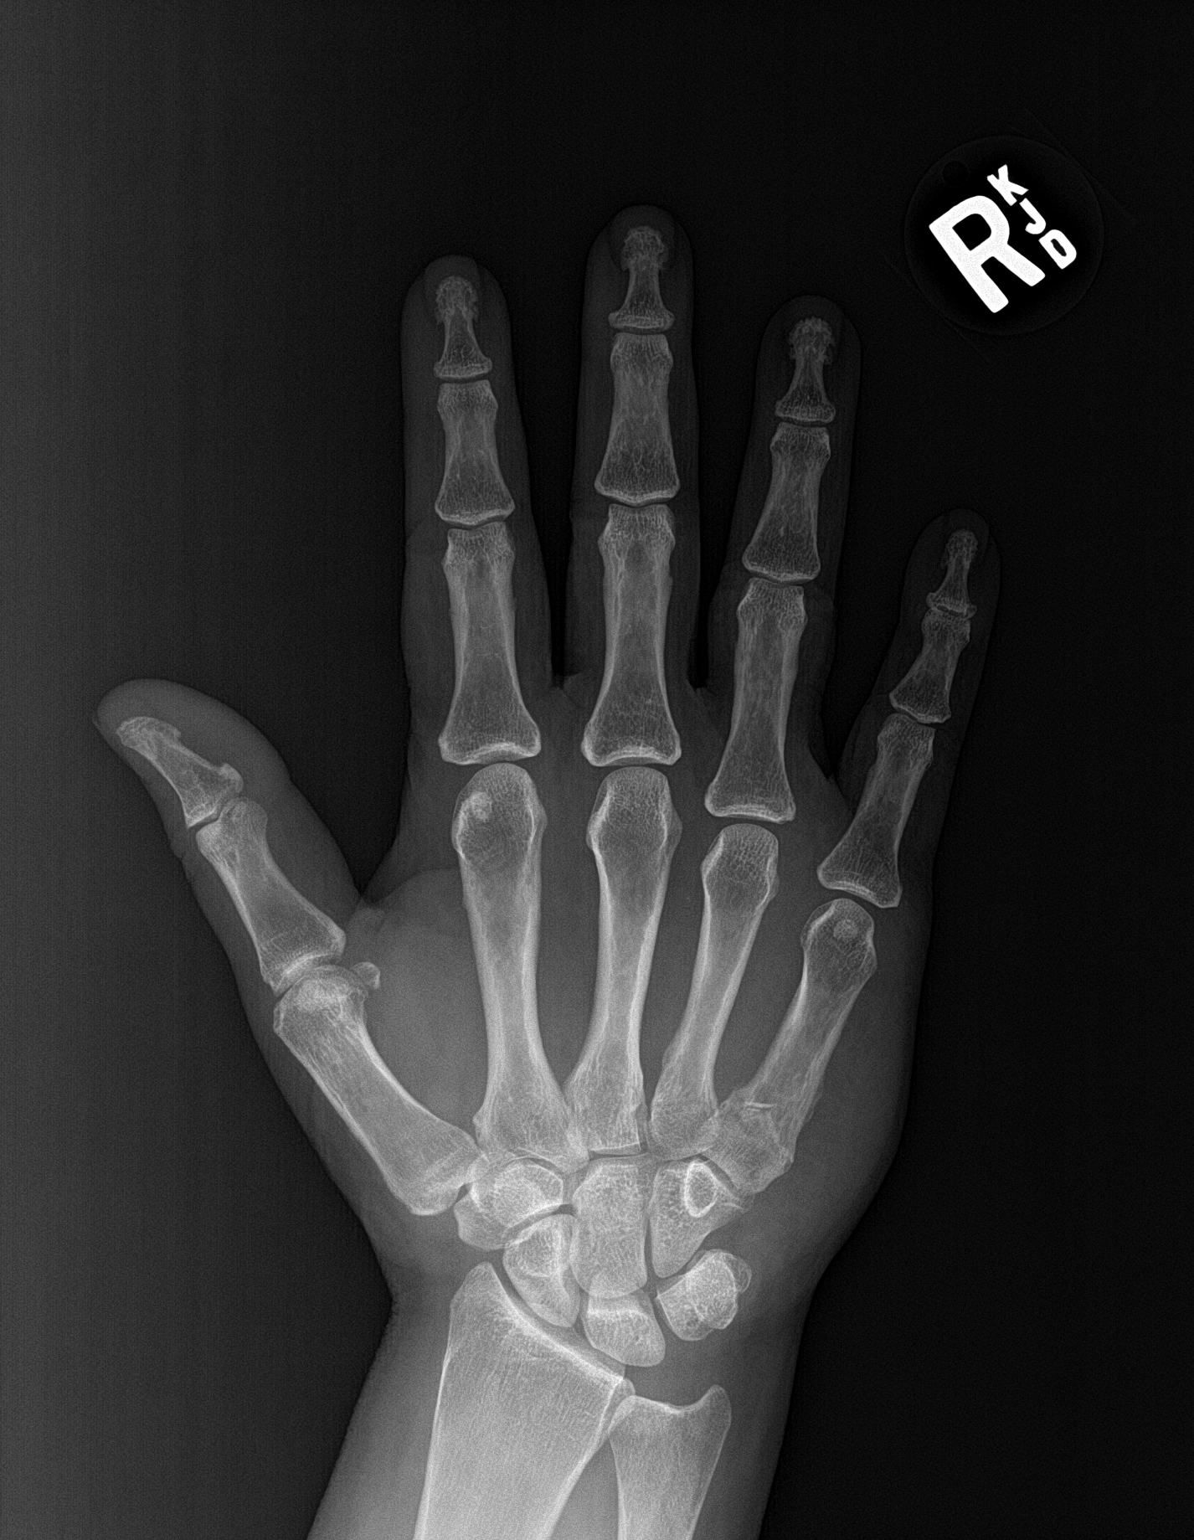

[hand obl]
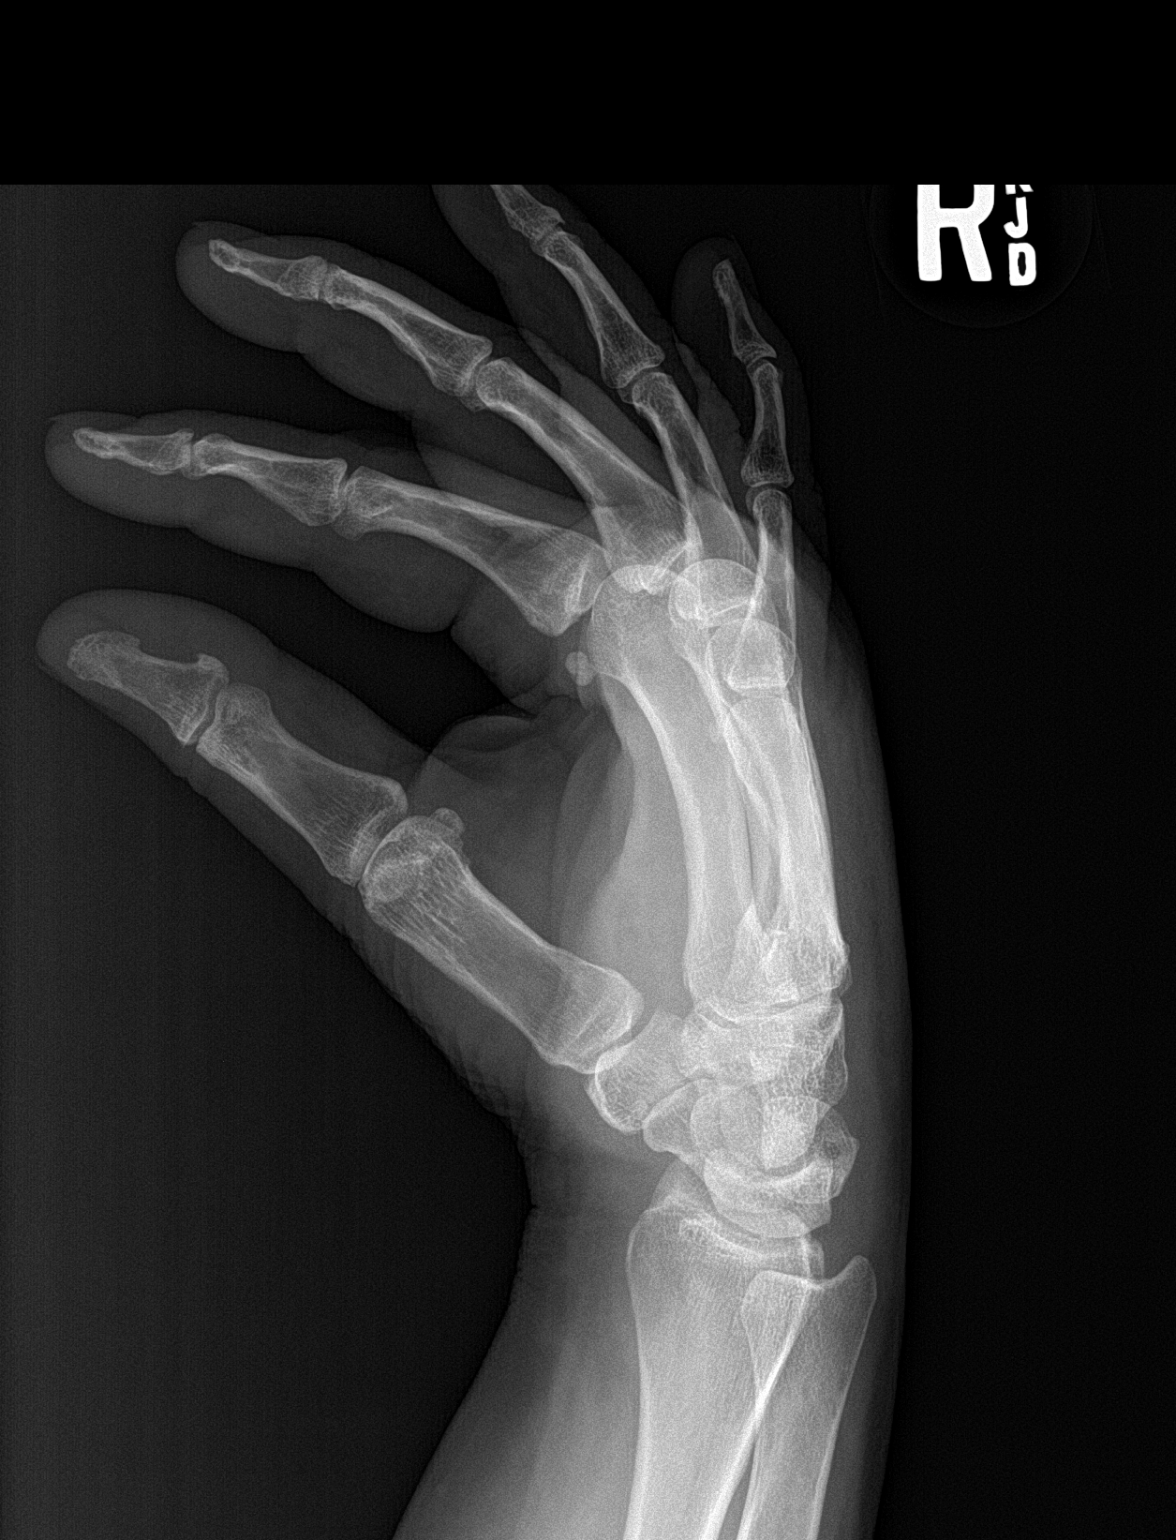

[hand lat]
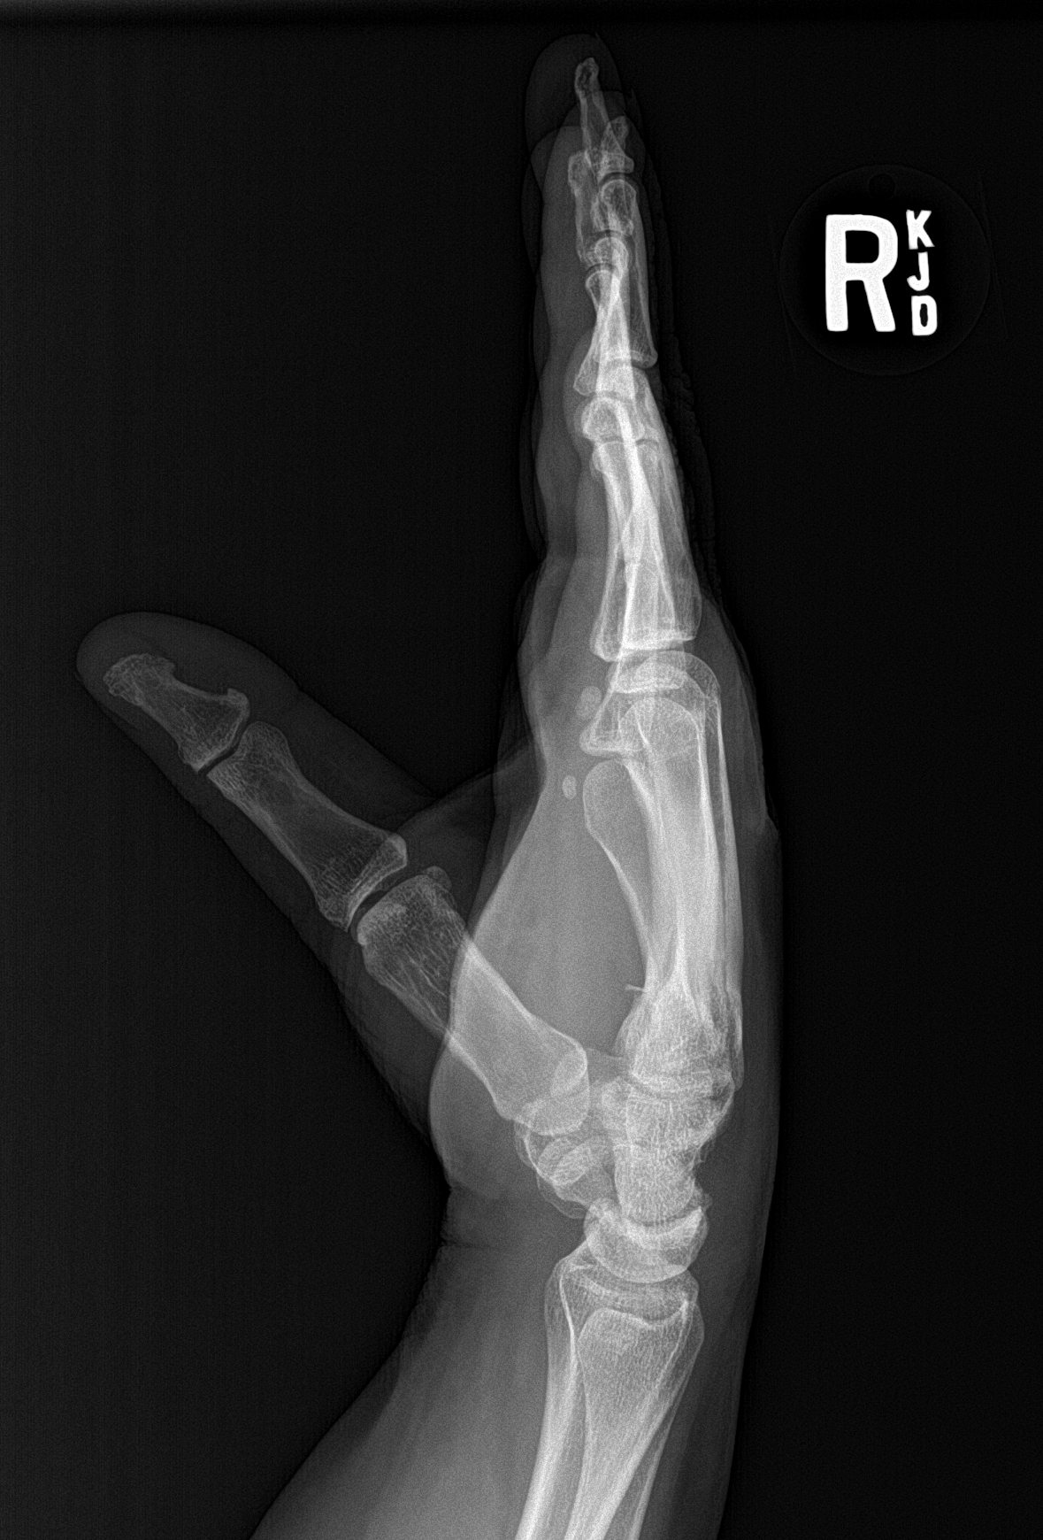

[3 of 3 positions shown; findings below may reference images not displayed]

FINDINGS: Transverse, minimally comminuted fracture at the base of the fifth
metacarpal. No intra-articular extension. Soft tissue swelling.
IMPRESSION: Fracture at the base of the fifth metacarpal, with minimal
comminution.

## 2018-03-22 IMAGING — CR DG WRIST COMPLETE 3+V*R*
4 series · 4 of 4 positions shown · non-contrast
Comparison: Hand films, dictated separately.

CLINICAL DATA: Initial encounter for Patient states she fell on
wrist. Bruising and swelling by 5th digit and wrist area. Best
possible film on navicular patient unable to move wrist

EXAM:
RIGHT WRIST - COMPLETE 3+ VIEW

[wrist pa]
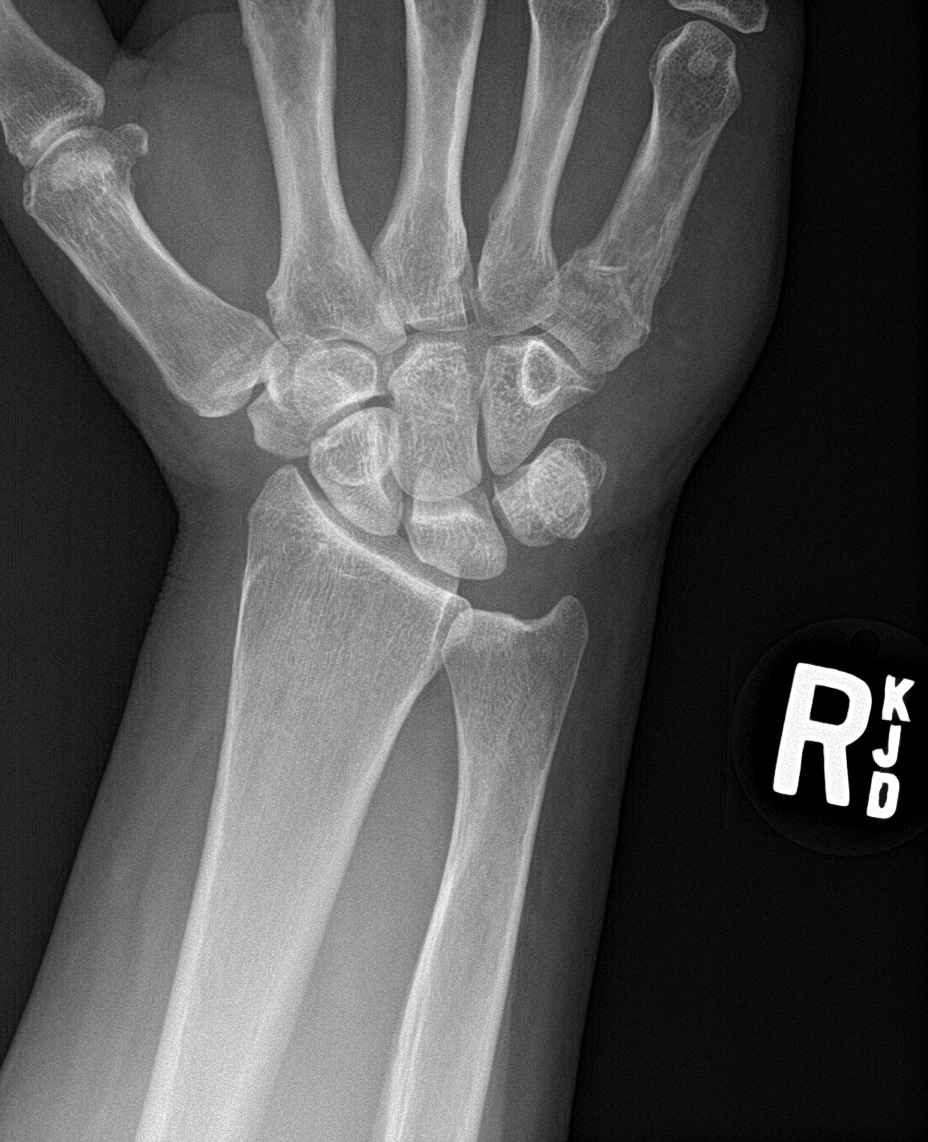

[wrist obl]
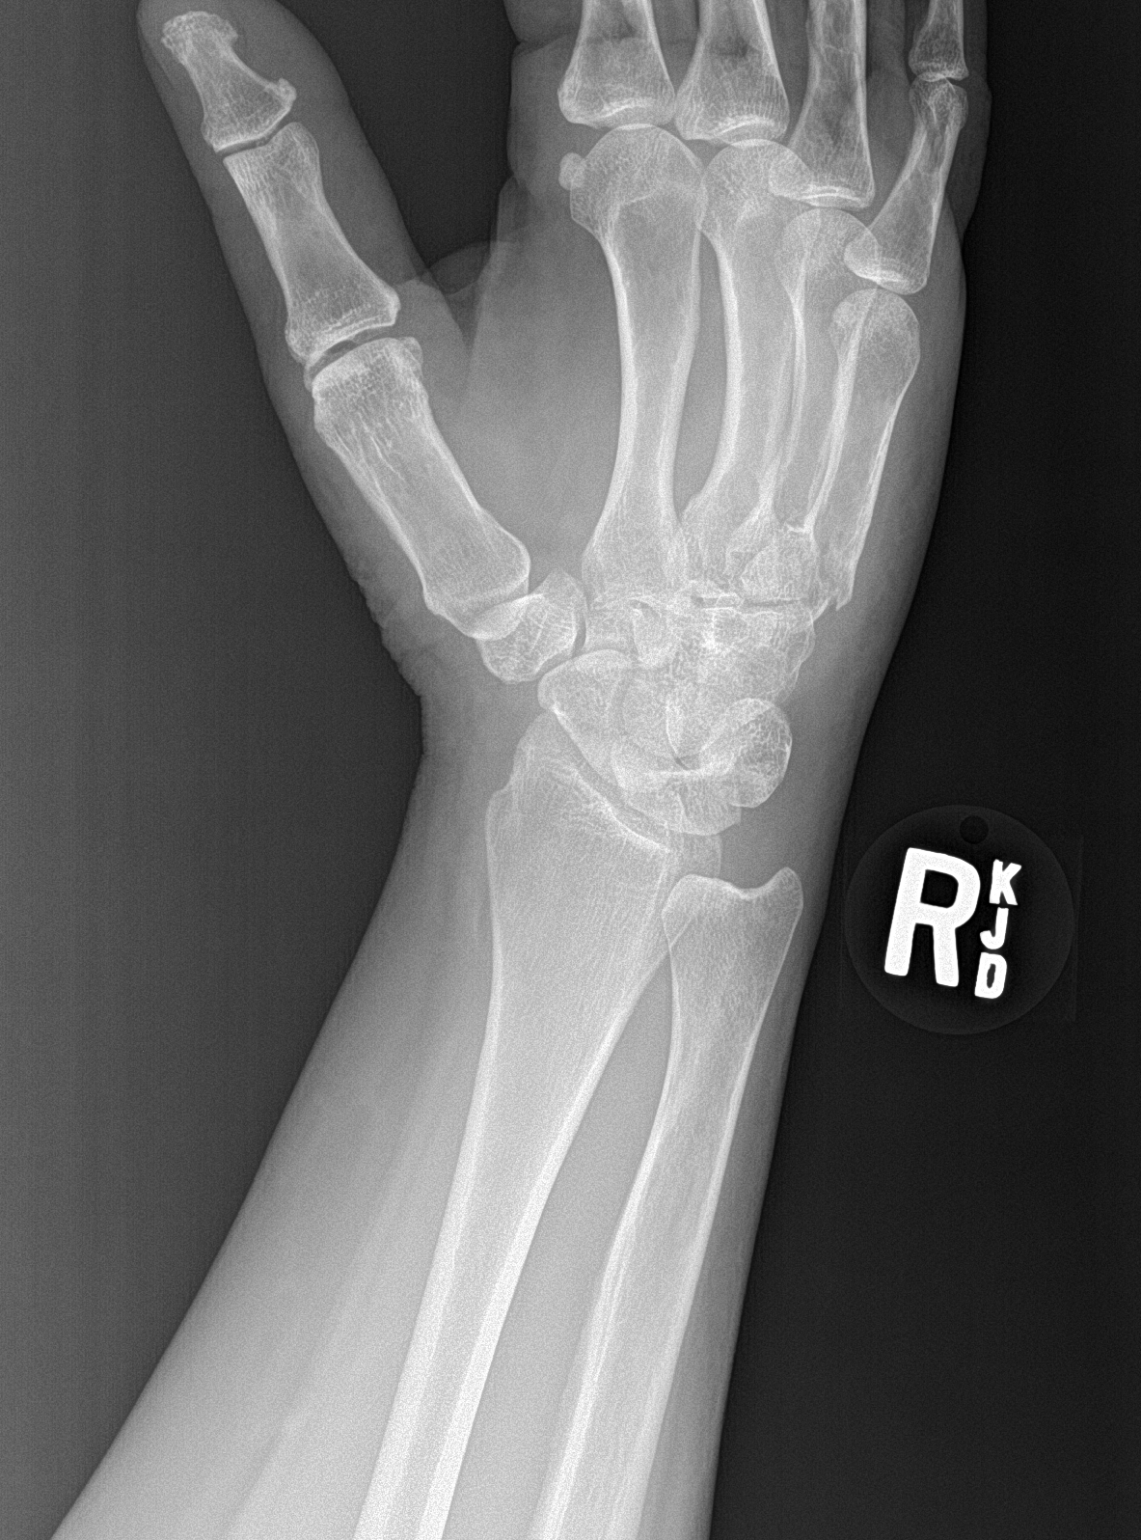

[wrist lat]
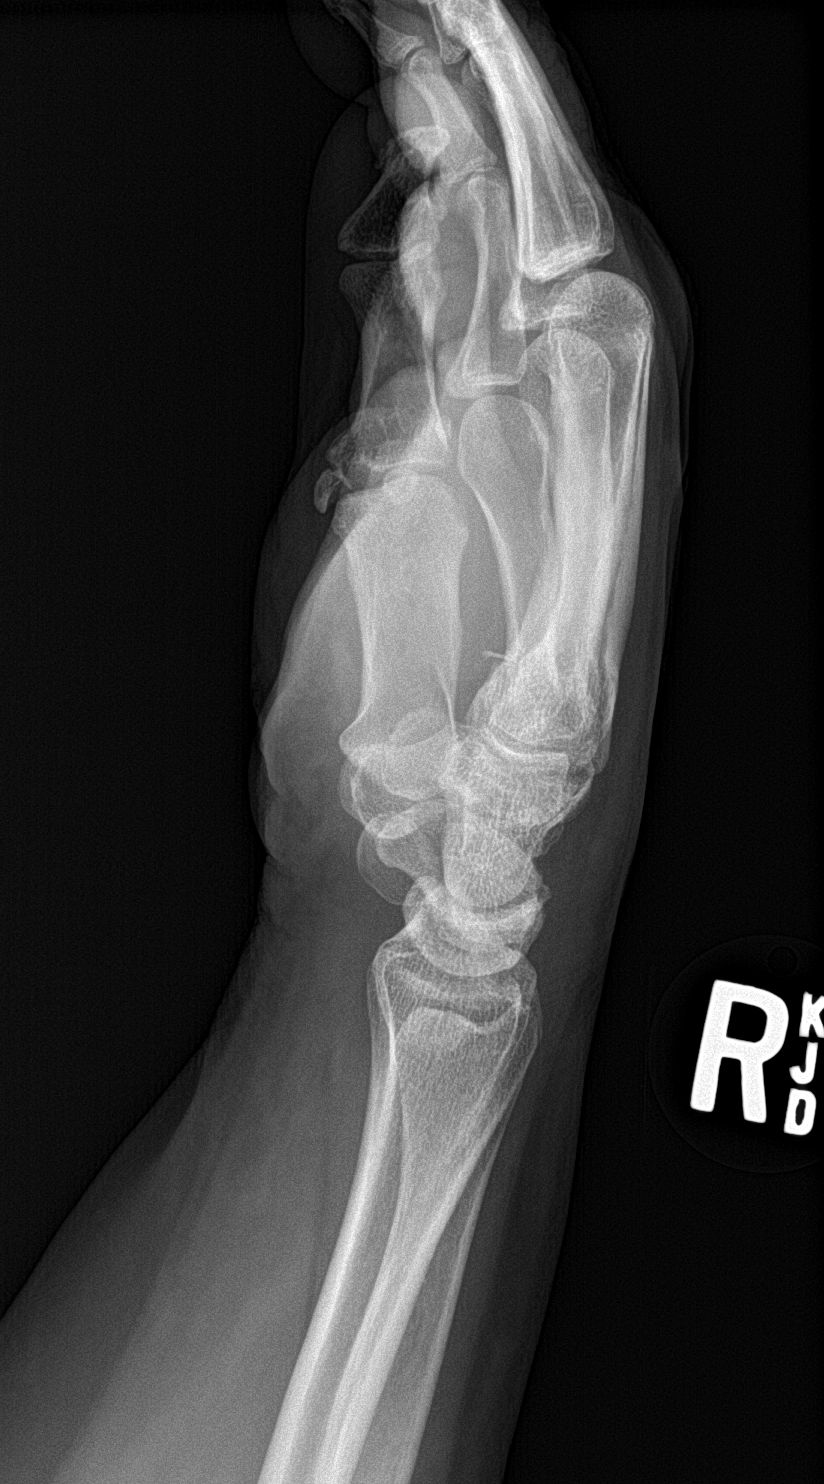

[wrist navicular]
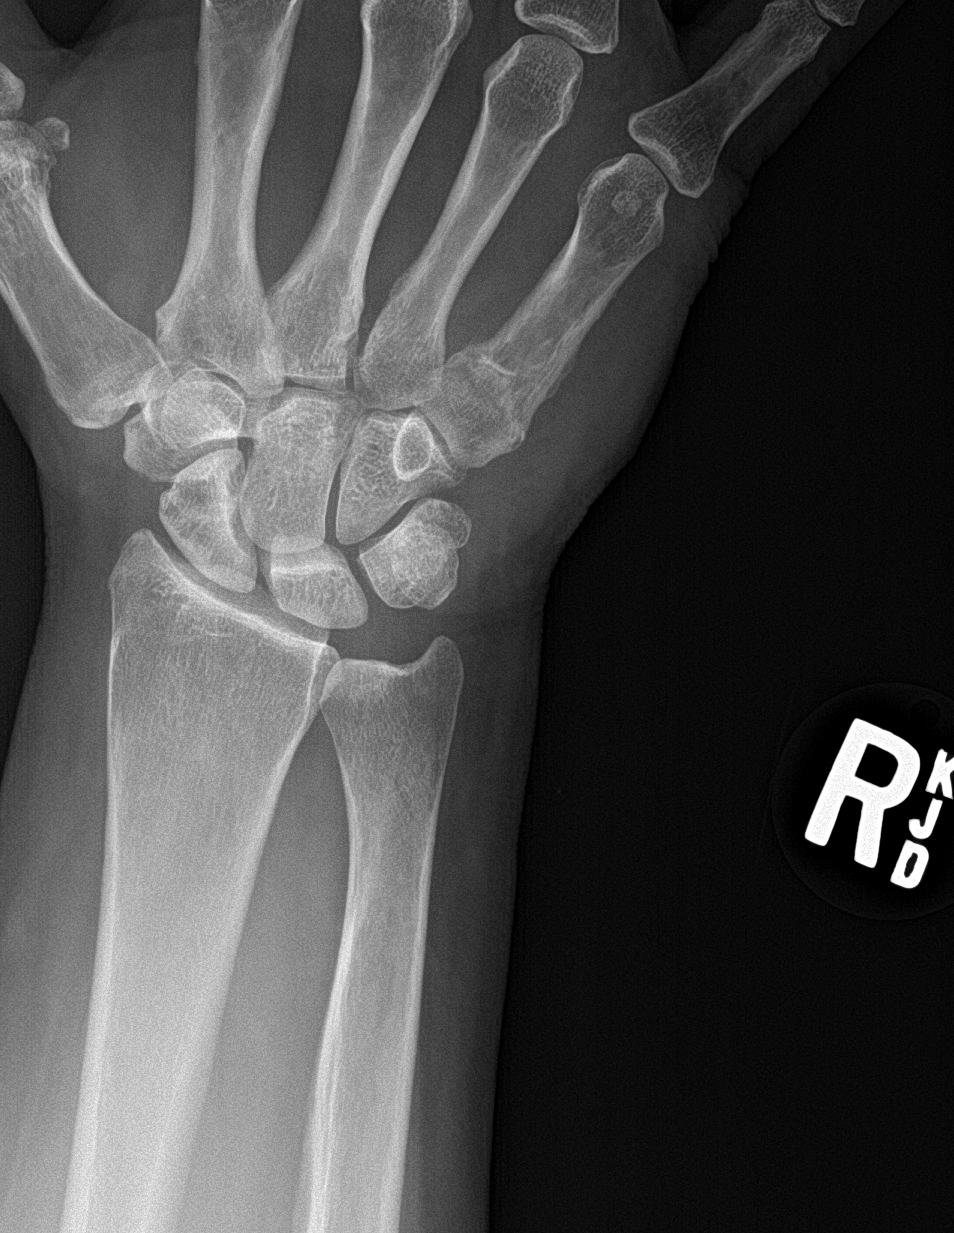

[4 of 4 positions shown; findings below may reference images not displayed]

FINDINGS: Soft tissue swelling about the carpal bones, primarily ulnarly.

A minimally comminuted fracture at the base of the fifth metacarpal
is better visualized on hand films. No other fracture identified.
Scaphoid intact.
IMPRESSION: Minimally comminuted fracture at the base of the fifth metacarpal.

## 2018-03-28 ENCOUNTER — Encounter: Payer: Self-pay | Admitting: Family Medicine

## 2018-03-28 ENCOUNTER — Ambulatory Visit (INDEPENDENT_AMBULATORY_CARE_PROVIDER_SITE_OTHER): Payer: Medicare HMO | Admitting: Family Medicine

## 2018-03-28 VITALS — BP 117/61 | HR 76 | Ht 63.0 in | Wt 170.3 lb

## 2018-03-28 DIAGNOSIS — Z23 Encounter for immunization: Secondary | ICD-10-CM | POA: Diagnosis not present

## 2018-03-28 DIAGNOSIS — E669 Obesity, unspecified: Secondary | ICD-10-CM

## 2018-03-28 DIAGNOSIS — Z636 Dependent relative needing care at home: Secondary | ICD-10-CM | POA: Diagnosis not present

## 2018-03-28 DIAGNOSIS — E782 Mixed hyperlipidemia: Secondary | ICD-10-CM

## 2018-03-28 DIAGNOSIS — I10 Essential (primary) hypertension: Secondary | ICD-10-CM

## 2018-03-28 DIAGNOSIS — R7303 Prediabetes: Secondary | ICD-10-CM

## 2018-03-28 DIAGNOSIS — R69 Illness, unspecified: Secondary | ICD-10-CM | POA: Diagnosis not present

## 2018-03-28 NOTE — Patient Instructions (Signed)
Your weight in 7 of 2017 was 235 pounds.  You are down 65 pounds total.  This is fantastic.  Great job and keep it up!!    How to Increase Your Level of Physical Activity  Getting regular physical activity is important for your overall health and well-being. Most people do not get enough exercise. There are easy ways to increase your level of physical activity, even if you have not been very active in the past or you are just starting out. Why is physical activity important? Physical activity has many short-term and long-term health benefits. Regular exercise can:  Help you lose weight or maintain a healthy weight.  Strengthen your muscles and bones.  Boost your mood and improve self-esteem.  Reduce your risk of certain long-term (chronic) diseases, like heart disease, cancer, and diabetes.  Help you stay capable of walking and moving around (mobile) as you age.  Prevent accidents, such as falls, as you age.  Increase life expectancy.  What are the benefits of being physically active on a regular basis? In addition to improving your physical health, being physically active on most days of the week can help you in ways that you may not expect. Benefits of regular physical activity may include:  Feeling good about your body.  Being able to move around more easily and for longer periods of time without getting tired (increased stamina).  Finding new sources of fun and enjoyment.  Meeting new people who share a common interest.  Being able to fight off illness better (enhanced immunity).  Being able to sleep better.  What can happen if I am not physically active on a regular basis? Not getting enough physical activity can lead to an unhealthy lifestyle and future health problems. This can increase your chances of:  Becoming overweight or obese.  Becoming sick.  Developing chronic illnesses, like heart disease or diabetes.  Having mental health problems, like depression or  anxiety.  Having sleep problems.  Having trouble walking or getting yourself around (reduced mobility).  Injuring yourself in a fall as you get older.  What steps can I take to be more physically active?  Check with your health care provider about how to get started. Ask your health care provider what activities are safe for you.  Start out slowly. Walking or doing some simple chair exercises is a good place to start, especially if you have not been active before or for a long time.  Try to find activities that you enjoy. You are more likely to commit to an exercise routine if it does not feel like a chore.  If you have bone or joint problems, choose low-impact exercises, like walking or swimming.  Include physical activity in your everyday routine.  Invite friends or family members to exercise with you. This also will help you commit to your workout plan.  Set goals that you can work toward.  Aim for at least 150 minutes of moderate-intensity exercise each week. Examples of moderate-intensity exercise include walking or riding a bike. Where to find more information:  Centers for Disease Control and Prevention: BowlingGrip.is  President's Council on [employer], Sports & Nutrition www.http://villegas.org/  ChooseMyPlate: [employer].dk Contact a health care provider if:  You have headaches, muscle aches, or joint pain.  You feel dizzy or light-headed while exercising.  You faint.  You have chest pain while exercising. Summary  Exercise benefits your mind and body at any age, even if you are just starting out.  If you  have a chronic illness or have not been active for a while, check with your health care provider before increasing your physical activity.  Choose activities that are safe and enjoyable for you.Ask your health care provider what activities are safe for you.  Start slowly. Tell your health care provider  if you have problems as you start to increase your activity level. This information is not intended to replace advice given to you by your health care provider. Make sure you discuss any questions you have with your health care provider. Document Released: 05/14/2016 Document Revised: 05/14/2016 Document Reviewed: 05/14/2016 Elsevier Interactive Patient Education  2018 [employer].     Please realize, EXERCISE IS MEDICINE!  -  American Heart Association ( AHA) guidelines for exercise : If you are in good health, without any medical conditions, you should engage in 150-300 minutes of moderate intensity aerobic activity per week.  This means you should be huffing and puffing throughout your workout.   Engaging in regular exercise will improve brain function and memory, as well as improve mood, boost immune system and help with weight management.  As well as the other, more well-known effects of exercise such as decreasing blood sugar levels, decreasing blood pressure,  and decreasing bad cholesterol levels/ increasing good cholesterol levels.     -  The AHA strongly endorses consumption of a diet that contains a variety of foods from all the food categories with an emphasis on fruits and vegetables; fat-free and low-fat dairy products; cereal and grain products; legumes and nuts; and fish, poultry, and/or extra lean meats.    Excessive food intake, especially of foods high in saturated and trans fats, sugar, and salt, should be avoided.    Adequate water intake of roughly 1/2 of your weight in pounds, should equal the ounces of water per day you should drink.  So for instance, if you're 200 pounds, that would be 100 ounces of water per day.         Mediterranean Diet  Why follow it? Research shows. . Those who follow the Mediterranean diet have a reduced risk of heart disease  . The diet is associated with a reduced incidence of Parkinson's and Alzheimer's diseases . People following the diet  may have longer life expectancies and lower rates of chronic diseases  . The Dietary Guidelines for Americans recommends the Mediterranean diet as an eating plan to promote health and prevent disease  What Is the Mediterranean Diet?  . Healthy eating plan based on typical foods and recipes of Mediterranean-style cooking . The diet is primarily a plant based diet; these foods should make up a majority of meals   Starches - Plant based foods should make up a majority of meals - They are an important sources of vitamins, minerals, energy, antioxidants, and fiber - Choose whole grains, foods high in fiber and minimally processed items  - Typical grain sources include wheat, oats, barley, corn, brown rice, bulgar, farro, millet, polenta, couscous  - Various types of beans include chickpeas, lentils, fava beans, black beans, white beans   Fruits  Veggies - Large quantities of antioxidant rich fruits & veggies; 6 or more servings  - Vegetables can be eaten raw or lightly drizzled with oil and cooked  - Vegetables common to the traditional Mediterranean Diet include: artichokes, arugula, beets, broccoli, brussel sprouts, cabbage, carrots, celery, collard greens, cucumbers, eggplant, kale, leeks, lemons, lettuce, mushrooms, okra, onions, peas, peppers, potatoes, pumpkin, radishes, rutabaga, shallots, spinach, sweet potatoes, turnips,  zucchini - Fruits common to the Mediterranean Diet include: apples, apricots, avocados, cherries, clementines, dates, figs, grapefruits, grapes, melons, nectarines, oranges, peaches, pears, pomegranates, strawberries, tangerines  Fats - Replace butter and margarine with healthy oils, such as olive oil, canola oil, and tahini  - Limit nuts to no more than a handful a day  - Nuts include walnuts, almonds, pecans, pistachios, pine nuts  - Limit or avoid candied, honey roasted or heavily salted nuts - Olives are central to the [employer] - can be eaten whole or used in a  variety of dishes   Meats Protein - Limiting red meat: no more than a few times a month - When eating red meat: choose lean cuts and keep the portion to the size of deck of cards - Eggs: approx. 0 to 4 times a week  - Fish and lean poultry: at least 2 a week  - Healthy protein sources include, chicken, [place], lean beef, lamb - Increase intake of seafood such as tuna, salmon, trout, mackerel, shrimp, scallops - Avoid or limit high fat processed meats such as sausage and bacon  Dairy - Include moderate amounts of low fat dairy products  - Focus on healthy dairy such as fat free yogurt, skim milk, low or reduced fat cheese - Limit dairy products higher in fat such as whole or 2% milk, cheese, ice cream  Alcohol - Moderate amounts of red wine is ok  - No more than 5 oz daily for women (all ages) and men older than age 101  - No more than 10 oz of wine daily for men younger than 67  Other - Limit sweets and other desserts  - Use herbs and spices instead of salt to flavor foods  - Herbs and spices common to the traditional Mediterranean Diet include: basil, bay leaves, chives, cloves, cumin, fennel, garlic, lavender, marjoram, mint, oregano, parsley, pepper, rosemary, sage, savory, sumac, tarragon, thyme   It's not just a diet, it's a lifestyle:  . The Mediterranean diet includes lifestyle factors typical of those in the region  . Foods, drinks and meals are best eaten with others and savored . Daily physical activity is important for overall good health . This could be strenuous exercise like running and aerobics . This could also be more leisurely activities such as walking, housework, yard-work, or taking the stairs . Moderation is the key; a balanced and healthy diet accommodates most foods and drinks . Consider portion sizes and frequency of consumption of certain foods   Meal Ideas & Options:  . Breakfast:  o Whole wheat toast or whole wheat English muffins with peanut butter & hard  boiled egg o Steel cut oats topped with apples & cinnamon and skim milk  o Fresh fruit: banana, strawberries, melon, berries, peaches  o Smoothies: strawberries, bananas, greek yogurt, peanut butter o Low fat greek yogurt with blueberries and granola  o Egg white omelet with spinach and mushrooms o Breakfast couscous: whole wheat couscous, apricots, skim milk, cranberries  . Sandwiches:  o Hummus and grilled vegetables (peppers, zucchini, squash) on whole wheat bread   o Grilled chicken on whole wheat pita with lettuce, tomatoes, cucumbers or tzatziki  o Tuna salad on whole wheat bread: tuna salad made with greek yogurt, olives, red peppers, capers, green onions o Garlic rosemary lamb pita: lamb sauted with garlic, rosemary, salt & pepper; add lettuce, cucumber, greek yogurt to pita - flavor with lemon juice and black pepper  . Seafood:  o  Mediterranean grilled salmon, seasoned with garlic, basil, parsley, lemon juice and black pepper o Shrimp, lemon, and spinach whole-grain pasta salad made with low fat greek yogurt  o Seared scallops with lemon orzo  o Seared tuna steaks seasoned salt, pepper, coriander topped with tomato mixture of olives, tomatoes, olive oil, minced garlic, parsley, green onions and cappers  . Meats:  o Herbed greek chicken salad with kalamata olives, cucumber, feta  o Red bell peppers stuffed with spinach, bulgur, lean ground beef (or lentils) & topped with feta   o Kebabs: skewers of chicken, tomatoes, onions, zucchini, squash  o [place] burgers: made with red onions, mint, dill, lemon juice, feta cheese topped with roasted red peppers . Vegetarian o Cucumber salad: cucumbers, artichoke hearts, celery, red onion, feta cheese, tossed in olive oil & lemon juice  o Hummus and whole grain pita points with a greek salad (lettuce, tomato, feta, olives, cucumbers, red onion) o Lentil soup with celery, carrots made with vegetable broth, garlic, salt and pepper  o Tabouli  salad: parsley, bulgur, mint, scallions, cucumbers, tomato, radishes, lemon juice, olive oil, salt and pepper.

## 2018-03-28 NOTE — Progress Notes (Signed)
Impression and Recommendations:    1. Essential hypertension   2. Mixed hyperlipidemia   3. Caregiver stress   4. Pre-diabetes   5. Obesity, Class I, BMI 30-34.9   6. Flu vaccine need    Essential hypertension -Reviewed heathy ranges of bp -Explained goal bp due to age -explained red flag symptoms for low bp and encouraged her to contact us if she starts having symptoms -Discussed the possibility of decreasing htn medications if she continues to lose weight and make healthy lifestyle changes -Educated pt about ranges of bp that notify us we need to decrease her dosage -explained that pt is currently in goal bp range due to her medication and she doesn't need her medication decreased currently  HLD -Encouraged pt to continue taking medication as directed -Discussed positive impact of exercise on HLD and cholesterol levels  -Discussed positive impact of weight loss on controlling HLD levels  Caregiver stress -Encouraged pt to try to make time for herself and time to relax in order to help maintain her own mood -Explained to pt that she can not control her husband and that she needs to see him as a separate person -Strongly encouraged pt to consider placing her husband into a home -Explained that if he gets too far out of control she needs to consider getting extra help -Encouraged pt to continue finding tasks that he can do to stay active and involved -Discussed the importance of taking breaks for self-care during extended responsibility for care-giving -Discussed safety at home and encouraged pt to reach out for help if she begins to feel unsafe at home -Explained that she has to accept that he has a disease and she has to explain it to others as well -encouraged pt to look into medicare for support with her husband's healthcare  Sleep -explained that it would be better to take melatonin instead of benadryl -Discussed negative impact on memory with long term benadryl  use -Discussed medications pt has tried for sleep in the past  BMI -Pt has lost 65 lbs since beginning in this practice -Encouraged pt to continue losing weight and making healthy dietary choices -Explained that with a few more lbs of weight loss, she will drop to a different category -Explained that reducing her BMI below 30 significantly decreases her risk of disease -Encouraged pt to begin exercising a little each day to help with weight loss and management of bp, HLD and HTN  Flu vaccine need - Plan: Flu Vaccine QUAD 6+ mos PF IM (Fluarix Quad PF)   Education and routine counseling performed. Handouts provided.   Medications Discontinued During This Encounter  Medication Reason  . Magnesium 125 MG CAPS Patient Preference     Orders Placed This Encounter  Procedures  . Flu Vaccine QUAD 6+ mos PF IM (Fluarix Quad PF)    Gross side effects, risk and benefits, and alternatives of medications and treatment plan in general discussed with patient.  Patient is aware that all medications have potential side effects and we are unable to predict every side effect or drug-drug interaction that may occur.   Patient will call with any questions prior to using medication if they have concerns.  Expresses verbal understanding and consents to current therapy and treatment regimen.  No barriers to understanding were identified.  Red flag symptoms and signs discussed in detail.  Patient expressed understanding regarding what to do in case of emergency\urgent symptoms  Please see AVS handed out to patient  at the end of our visit for further patient instructions/ counseling done pertaining to today's office visit.   Return for f/up for BP, mood in 4-66mo.     Note:  This document was prepared using Dragon voice recognition software and may include unintentional dictation errors.  This document serves as a record of services personally performed by Mellody Dance, MD. It was created on her behalf  by Georga Bora, a trained medical scribe. The creation of this record is based on the scribe's personal observations and the provider's statements to them.   I have reviewed the above medical documentation for accuracy and completeness and I concur.  Mellody Dance 03/28/18 4:10 PM    --------------------------------------------------------------------------------------------------------------------------------------------------------------------------------------------------------------------------------------------    Subjective:    CC:  Chief Complaint  Patient presents with  . Follow-up    HPI: Misty Decker is a 62 y.o. female who presents to [place] at [facility] today for follow-up of mood.    Mood -Pt is stressed about her husband's behavior  -Her husband previously had a brain injury and his mood/behavior can be erratic -States she knows that he loses control when he is set off or overwhelmed -Says she always has to correct his behavior and language because he forgets to be polite -Explained that many people don't know how bad her husband is because they don't see his behavior when he has bad days -Says she knows she needs time for herself so she's been very active in church and prays -Says her husband helps in church by greeting people, taking up the offering, and things like that -Pt states there's no way she can afford supportive care or medicare and refuses to look into them -States they need her husband to stay home so they can afford his care   Depression screen [facility] 2/9 03/28/2018 11/12/2017 09/27/2017  Decreased Interest 0 0 0  Down, Depressed, Hopeless 0 0 0  PHQ - 2 Score 0 0 0  Altered sleeping 2 1 1   Tired, decreased energy 0 0 0  Change in appetite 0 0 0  Feeling bad or failure about yourself  0 0 0  Trouble concentrating 0 0 0  Moving slowly or fidgety/restless 0 0 0  Suicidal thoughts 0 0 0  PHQ-9 Score 2 1 1   Difficult  doing work/chores Not difficult at all Not difficult at all -  Some recent data might be hidden     GAD 7 : Generalized Anxiety Score 03/28/2018 10/26/2016  Nervous, Anxious, on Edge 0 1  Control/stop worrying 3 1  Worry too much - different things 3 1  Trouble relaxing 0 0  Restless 0 0  Easily annoyed or irritable 0 1  Afraid - awful might happen 0 1  Total GAD 7 Score 6 5  Anxiety Difficulty - Somewhat difficult    HTN HPI: -Pt has not been checking her bp but has been taking her medication as prescribed  - Denies medication S-E   - Smoking Status noted   - She denies new onset of: chest pain, exercise intolerance, shortness of breath, dizziness, visual changes, headache, lower extremity swelling or claudication.   Last 3 blood pressure readings in our office are as follows: BP Readings from Last 3 Encounters:  03/28/18 117/61  11/12/17 115/72  10/20/17 111/61    Filed Weights   03/28/18 1015  Weight: 170 lb 4.8 oz (77.2 kg)    Sleep -States she's been taking half a benadryl every night  to sleep -Says she's been taking benadryl to deal with itching every night -pt says she is taking melatonin but sometimes she takes 2mg  and sometimes she takes up to 10mg    constipaiton -Pt has been taking metamucil each day and it has helped her be more regular -states she drinks almost a gallon of water each day  Exercise -Pt states she doesn't exercise because she doesn't want to leave her husband too long -Says she can't leave because she has to "fix the stuff he messes up"  Gynecology -Pt says she had last pap smear in June 2019 -States she is getting yearly mammograms  Wt Readings from Last 3 Encounters:  03/28/18 170 lb 4.8 oz (77.2 kg)  11/12/17 173 lb 8 oz (78.7 kg)  10/20/17 179 lb (81.2 kg)   BP Readings from Last 3 Encounters:  03/28/18 117/61  11/12/17 115/72  10/20/17 111/61   Pulse Readings from Last 3 Encounters:  03/28/18 76  11/12/17 63  10/20/17  65   BMI Readings from Last 3 Encounters:  03/28/18 30.17 kg/m  11/12/17 30.73 kg/m  10/20/17 31.71 kg/m         Patient Care Team    Relationship Specialty Notifications Start End  Mellody Dance, DO PCP - General Family Medicine  10/26/16   Roseanne Kaufman, MD Consulting Physician Orthopedic Surgery  10/26/16   Druscilla Brownie, MD Consulting Physician Dermatology  10/26/16   Drema [name], MD  Physical Medicine and Rehabilitation  10/26/16   Glenna Fellows, MD Attending Physician Neurosurgery  10/26/16   Shela Leff, MD Resident Internal Medicine  10/26/16   Armbruster, Carlota Raspberry, MD Consulting Physician Gastroenterology  02/26/17      Patient Active Problem List   Diagnosis Date Noted  . Stopped smoking with greater than 30 pack year history 02/26/2017    Priority: High  . Gastric erosions--- NSAID-related erosions 10/26/2016    Priority: High  . Pre-diabetes 10/26/2016    Priority: High  . Statin intolerance- elevated LFT's and severe myalgias 10/26/2016    Priority: High  . Dysthymic disorder 10/23/2009    Priority: High  . Hyperlipidemia 09/23/2006    Priority: High  . Essential hypertension 09/23/2006    Priority: High  . Family history of diabetes mellitus (DM)- Mom and sister 10/26/2016    Priority: Medium  . Obesity, Class II, BMI 35-39.9, with comorbidity 10/26/2016    Priority: Medium  . FIBROMYALGIA 05/16/2009    Priority: Medium  . HYSTERECTOMY, TOTAL, HX OF 09/23/2006    Priority: Medium  . Nonalcoholic fatty liver disease- elevated LFT's 02/27/2014    Priority: Low  . Gastroesophageal reflux disease without esophagitis 05/08/2013    Priority: Low  . DEGENERATIVE JOINT DISEASE 09/23/2006    Priority: Low  . Caregiver stress 03/28/2018  . Spondylolisthesis, lumbar region 12/15/2017  . Elevated blood sugar level 11/12/2017  . Papanicolaou smear for cervical cancer screening 11/12/2017  . Family history of breast cancer in mother  11/12/2017  . Gastroesophageal reflux disease 09/27/2017  . Obesity, Class I, BMI 30-34.9 09/27/2017  . Elevated hemoglobin ([place]) 01/17/2015  . Health care maintenance 04/26/2014  . Cervicogenic headache 04/25/2012  . History of spinal surgery 05/05/2011  . Back pain 05/05/2011    Past Medical history, Surgical history, Family history, Social history, Allergies and Medications have been entered into the medical record, reviewed and changed as needed.    Current Meds  Medication Sig  . Calcium Carbonate-Vitamin D (CALCIUM-VITAMIN D) 600-125 MG-UNIT TABS  Take 2 tablets by mouth 2 (two) times daily.   . Cinnamon 500 MG capsule Take 1,000 mg by mouth daily.  . COLLAGEN-VITAMIN C PO Take 1 tablet by mouth. Take 2 pills in am and 1 pill at night  . diphenhydrAMINE (BENADRYL) 25 MG tablet Take 12.5 mg by mouth at bedtime.   . hydrochlorothiazide (HYDRODIURIL) 25 MG tablet TAKE 1 TABLET BY MOUTH ONCE DAILY  . Melatonin 5 MG CAPS Take 1 capsule by mouth at bedtime as needed.   . Multiple Vitamin (MULTIVITAMIN) capsule Take 1 capsule by mouth daily.    . Probiotic Product (PROBIOTIC DAILY PO) Take 1 tablet by mouth daily.  . psyllium (METAMUCIL) 58.6 % powder Take 1 packet by mouth. Take 1 tsp daily  . TURMERIC PO Take 1 tablet by mouth daily.  . Vitamins-Lipotropics (BALANCED B-50 COMPLEX PO) Take 1 tablet by mouth daily.   Current Facility-Administered Medications for the 03/28/18 encounter (Office Visit) with Mellody Dance, DO  Medication  . 0.9 %  sodium chloride infusion    Allergies:  Allergies  Allergen Reactions  . Codeine     Nausea and vomiting, hallucinations  . Morphine     B/P dropped!  . Beef-Derived Products Nausea And Vomiting  . Doxycycline Nausea And Vomiting  . Ibuprofen     Caused RLS  . Latex     REACTION: rash  . Nsaids Other (See Comments)    PT has hx of ulcers.  . Penicillins     hives  . Pork-Derived Products Nausea And Vomiting  . Tramadol      Caused RLS     Review of Systems: Review of Systems: General:   No F/C, wt loss Pulm:   No DIB, SOB, pleuritic chest pain Card:  No CP, palpitations Abd:  No n/v/d or pain Ext:  No inc edema from baseline Psych: no SI/ HI    Objective:   Blood pressure 117/61, pulse 76, height 5\' 3"  (1.6 m), weight 170 lb 4.8 oz (77.2 kg), SpO2 99 %. Body mass index is 30.17 kg/m. General:  Well Developed, well nourished, appropriate for stated age.  Neuro:  Alert and oriented,  extra-ocular muscles intact  HEENT:  Normocephalic, atraumatic, neck supple, no carotid bruits appreciated  Skin:  no gross rash, warm, pink. Cardiac:  RRR, S1 S2 Respiratory:  ECTA B/L and A/P, Not using accessory muscles, speaking in full sentences- unlabored. Vascular:  Ext warm, no cyanosis apprec.; cap RF less 2 sec. Psych:  No HI/SI, judgement and insight good, Euthymic mood. Full Affect.

## 2018-06-28 ENCOUNTER — Other Ambulatory Visit: Payer: Self-pay | Admitting: Family Medicine

## 2018-06-29 DIAGNOSIS — Z6830 Body mass index (BMI) 30.0-30.9, adult: Secondary | ICD-10-CM | POA: Diagnosis not present

## 2018-06-29 DIAGNOSIS — Z823 Family history of stroke: Secondary | ICD-10-CM | POA: Diagnosis not present

## 2018-06-29 DIAGNOSIS — E669 Obesity, unspecified: Secondary | ICD-10-CM | POA: Diagnosis not present

## 2018-06-29 DIAGNOSIS — Z803 Family history of malignant neoplasm of breast: Secondary | ICD-10-CM | POA: Diagnosis not present

## 2018-06-29 DIAGNOSIS — K219 Gastro-esophageal reflux disease without esophagitis: Secondary | ICD-10-CM | POA: Diagnosis not present

## 2018-06-29 DIAGNOSIS — M792 Neuralgia and neuritis, unspecified: Secondary | ICD-10-CM | POA: Diagnosis not present

## 2018-06-29 DIAGNOSIS — G8929 Other chronic pain: Secondary | ICD-10-CM | POA: Diagnosis not present

## 2018-06-29 DIAGNOSIS — I10 Essential (primary) hypertension: Secondary | ICD-10-CM | POA: Diagnosis not present

## 2018-06-29 DIAGNOSIS — J309 Allergic rhinitis, unspecified: Secondary | ICD-10-CM | POA: Diagnosis not present

## 2018-06-29 DIAGNOSIS — R32 Unspecified urinary incontinence: Secondary | ICD-10-CM | POA: Diagnosis not present

## 2018-07-25 ENCOUNTER — Other Ambulatory Visit: Payer: Self-pay | Admitting: Family Medicine

## 2018-07-25 DIAGNOSIS — Z1231 Encounter for screening mammogram for malignant neoplasm of breast: Secondary | ICD-10-CM

## 2018-09-08 ENCOUNTER — Ambulatory Visit: Payer: Medicare HMO

## 2018-09-12 ENCOUNTER — Ambulatory Visit (INDEPENDENT_AMBULATORY_CARE_PROVIDER_SITE_OTHER): Payer: Medicare HMO | Admitting: Family Medicine

## 2018-09-12 ENCOUNTER — Other Ambulatory Visit: Payer: Self-pay

## 2018-09-12 ENCOUNTER — Encounter: Payer: Self-pay | Admitting: Family Medicine

## 2018-09-12 VITALS — Ht 63.0 in | Wt 170.0 lb

## 2018-09-12 DIAGNOSIS — G4700 Insomnia, unspecified: Secondary | ICD-10-CM

## 2018-09-12 DIAGNOSIS — Z636 Dependent relative needing care at home: Secondary | ICD-10-CM

## 2018-09-12 DIAGNOSIS — F341 Dysthymic disorder: Secondary | ICD-10-CM | POA: Diagnosis not present

## 2018-09-12 DIAGNOSIS — E782 Mixed hyperlipidemia: Secondary | ICD-10-CM

## 2018-09-12 DIAGNOSIS — I10 Essential (primary) hypertension: Secondary | ICD-10-CM | POA: Diagnosis not present

## 2018-09-12 DIAGNOSIS — R7303 Prediabetes: Secondary | ICD-10-CM | POA: Diagnosis not present

## 2018-09-12 DIAGNOSIS — R69 Illness, unspecified: Secondary | ICD-10-CM | POA: Diagnosis not present

## 2018-09-12 DIAGNOSIS — E669 Obesity, unspecified: Secondary | ICD-10-CM | POA: Diagnosis not present

## 2018-09-12 DIAGNOSIS — Z789 Other specified health status: Secondary | ICD-10-CM

## 2018-09-12 NOTE — Progress Notes (Signed)
Impression and Recommendations:    1. Essential hypertension   2. Mixed hyperlipidemia   3. Statin intolerance- elevated LFT's and severe myalgias   4. Dysthymic disorder   5. Caregiver stress   6. Insomnia, unspecified type   7. Pre-diabetes   8. Obesity, Class I, BMI 30-34.9     1. Essential hypertension - no BP ciff at home. - unknown if controlled or not - advised to get one- for at home  2. Mixed hyperlipidemia Reck June 2020  3. Statin intolerance- elevated LFT's and severe myalgias - h/o elevated LFT's- will check end June  4. Dysthymic disorder Controlled currently  5. Caregiver stress Controlled a tcurrent - d/c pt ways to help handle stress  6. Insomnia, unspecified type Stable- no changege form prior  7. Pre-diabetes Stable- well controlled wihne last checked   8. Obesity, Class I, BMI 30-34.9 sztbale-     Gross side effects, risk and benefits, and alternatives of medications and treatment plan in general discussed with patient.  Patient is aware that all medications have potential side effects and we are unable to predict every side effect or drug-drug interaction that may occur.   Patient will call with any questions prior to using medication if they have concerns.    Expresses verbal understanding and consents to current therapy and treatment regimen.  No barriers to understanding were identified.  Red flag symptoms and signs discussed in detail.  Patient expressed understanding regarding what to do in case of emergency\urgent symptoms  Please see AVS handed out to patient at the end of our visit for further patient instructions/ counseling done pertaining to today's office visit.   Return for CPE/ yrly physical, come fasting- end of June 2020.     Note:  This note was prepared with assistance of Dragon voice recognition software. Occasional wrong-word or sound-a-like substitutions may have occurred due to the inherent limitations of voice  recognition software.      --------------------------------------------------------------------------------------------------------------------------------------------------------------------------------------------------------------------------------------------    Subjective:     HPI: Misty Decker is a 63 y.o. female who presents to [place] at [facility] today for issues as discussed below.   BP:  No sx at all, takin gher HCTZ daily.  Not checking at hom eas she does not have a bp cuff.    Sleep-  Taking melatonin nightly-  Has been a prob for yrs and yrs.  Mood- doing well. No meds- delcines need today  Wt- continues to maintain-   Pre- dm:  5.4 a1c  7-8 mo ago.  Cont w/ wt loss  CHol:   Inc LDL and TG--> June 2019 was last check.   Fish oil caused Gi distressed- she stopped them many mo ago.   Caregiver stress-   takign care of husband with his MMP- mental illnmess- BIPOLAR-  as wqell.  A lot of stress on pt    Wt Readings from Last 3 Encounters:  09/12/18 170 lb (77.1 kg)  03/28/18 170 lb 4.8 oz (77.2 kg)  11/12/17 173 lb 8 oz (78.7 kg)   BP Readings from Last 3 Encounters:  03/28/18 117/61  11/12/17 115/72  10/20/17 111/61   Pulse Readings from Last 3 Encounters:  03/28/18 76  11/12/17 63  10/20/17 65   BMI Readings from Last 3 Encounters:  09/12/18 30.11 kg/m  03/28/18 30.17 kg/m  11/12/17 30.73 kg/m     Patient Care Team    Relationship Specialty Notifications Start End  Mellody Dance, DO PCP - General Family Medicine  10/26/16   Roseanne Kaufman, MD Consulting Physician Orthopedic Surgery  10/26/16   Druscilla Brownie, MD Consulting Physician Dermatology  10/26/16   Drema [name], MD  Physical Medicine and Rehabilitation  10/26/16   Glenna Fellows, MD Attending Physician Neurosurgery  10/26/16   Shela Leff, MD Resident Internal Medicine  10/26/16   Armbruster, Carlota Raspberry, MD Consulting Physician Gastroenterology   02/26/17      Patient Active Problem List   Diagnosis Date Noted  . Stopped smoking with greater than 30 pack year history 02/26/2017    Priority: High  . Gastric erosions--- NSAID-related erosions 10/26/2016    Priority: High  . Pre-diabetes 10/26/2016    Priority: High  . Statin intolerance- elevated LFT's and severe myalgias 10/26/2016    Priority: High  . Dysthymic disorder 10/23/2009    Priority: High  . Mixed hyperlipidemia 09/23/2006    Priority: High  . Essential hypertension 09/23/2006    Priority: High  . Family history of diabetes mellitus (DM)- Mom and sister 10/26/2016    Priority: Medium  . Obesity, Class II, BMI 35-39.9, with comorbidity 10/26/2016    Priority: Medium  . FIBROMYALGIA 05/16/2009    Priority: Medium  . HYSTERECTOMY, TOTAL, HX OF 09/23/2006    Priority: Medium  . Nonalcoholic fatty liver disease- elevated LFT's 02/27/2014    Priority: Low  . Gastroesophageal reflux disease without esophagitis 05/08/2013    Priority: Low  . DEGENERATIVE JOINT DISEASE 09/23/2006    Priority: Low  . Insomnia 09/12/2018  . Caregiver stress 03/28/2018  . Spondylolisthesis, lumbar region 12/15/2017  . Elevated blood sugar level 11/12/2017  . Papanicolaou smear for cervical cancer screening 11/12/2017  . Family history of breast cancer in mother 11/12/2017  . Gastroesophageal reflux disease 09/27/2017  . Obesity, Class I, BMI 30-34.9 09/27/2017  . Elevated hemoglobin ([place]) 01/17/2015  . Health care maintenance 04/26/2014  . Cervicogenic headache 04/25/2012  . History of spinal surgery 05/05/2011  . Back pain 05/05/2011    Past Medical history, Surgical history, Family history, Social history, Allergies and Medications have been entered into the medical record, reviewed and changed as needed.    Current Meds  Medication Sig  . Ascorbic Acid (VITAMIN C GUMMIE PO) Take 2 each by mouth daily.  . Calcium Carbonate-Vitamin D (CALCIUM-VITAMIN D) 600-125 MG-UNIT  TABS Take 2 tablets by mouth 2 (two) times daily.   . Cinnamon 500 MG capsule Take 1,000 mg by mouth daily.  . diphenhydrAMINE (BENADRYL) 25 MG tablet Take 12.5 mg by mouth at bedtime.   . Glucosamine-Chondroit-Vit C-Mn (GLUCOSAMINE 1500 COMPLEX PO) Take 1 tablet by mouth.  . hydrochlorothiazide (HYDRODIURIL) 25 MG tablet TAKE 1 TABLET BY MOUTH ONCE DAILY  . Melatonin 5 MG CAPS Take 1 capsule by mouth at bedtime as needed.   . Multiple Vitamin (MULTIVITAMIN) capsule Take 1 capsule by mouth daily.    . Probiotic Product (PROBIOTIC DAILY PO) Take 1 tablet by mouth daily.  . psyllium (METAMUCIL) 58.6 % powder Take 1 packet by mouth. Take 1 tsp daily  . TURMERIC PO Take 1 tablet by mouth daily.  . Vitamins-Lipotropics (BALANCED B-50 COMPLEX PO) Take 1 tablet by mouth daily.   Current Facility-Administered Medications for the 09/12/18 encounter (Office Visit) with Mellody Dance, DO  Medication  . 0.9 %  sodium chloride infusion    Allergies:  Allergies  Allergen Reactions  . Codeine     Nausea and vomiting, hallucinations  .  Morphine     B/P dropped!  . Beef-Derived Products Nausea And Vomiting  . Doxycycline Nausea And Vomiting  . Ibuprofen     Caused RLS  . Latex     REACTION: rash  . Nsaids Other (See Comments)    PT has hx of ulcers.  . Penicillins     hives  . Pork-Derived Products Nausea And Vomiting  . Tramadol     Caused RLS     Review of Systems:  A fourteen system review of systems was performed and found to be positive as per HPI.   Objective:   Height 5\' 3"  (1.6 m), weight 170 lb (77.1 kg). Body mass index is 30.11 kg/m. General:  Well Developed, well nourished, appropriate for stated age.  Neuro:  Alert and oriented,  extra-ocular muscles intact  HEENT:  Normocephalic, atraumatic, neck supple, no carotid bruits appreciated  Skin:  no gross rash, warm, pink. Cardiac:  RRR, S1 S2 Respiratory:  ECTA B/L and A/P, Not using accessory muscles, speaking in full  sentences- unlabored. Vascular:  Ext warm, no cyanosis apprec.; cap RF less 2 sec. Psych:  No HI/SI, judgement and insight good, Euthymic mood. Full Affect.

## 2018-09-25 ENCOUNTER — Other Ambulatory Visit: Payer: Self-pay | Admitting: Family Medicine

## 2018-10-17 ENCOUNTER — Other Ambulatory Visit: Payer: Self-pay

## 2018-10-17 ENCOUNTER — Ambulatory Visit
Admission: RE | Admit: 2018-10-17 | Discharge: 2018-10-17 | Disposition: A | Discharge status: Home / Self Care | Payer: Medicare HMO | Source: Ambulatory Visit | Attending: Family Medicine | Admitting: Family Medicine

## 2018-10-17 DIAGNOSIS — Z1231 Encounter for screening mammogram for malignant neoplasm of breast: Secondary | ICD-10-CM

## 2018-10-24 ENCOUNTER — Ambulatory Visit: Payer: Medicare HMO

## 2018-12-05 ENCOUNTER — Encounter: Payer: Self-pay | Admitting: Family Medicine

## 2018-12-05 ENCOUNTER — Other Ambulatory Visit: Payer: Self-pay

## 2018-12-05 ENCOUNTER — Ambulatory Visit (INDEPENDENT_AMBULATORY_CARE_PROVIDER_SITE_OTHER): Payer: Medicare HMO | Admitting: Family Medicine

## 2018-12-05 VITALS — BP 110/86 | HR 76 | Temp 98.3°F | Ht 63.0 in | Wt 179.4 lb

## 2018-12-05 DIAGNOSIS — Z7189 Other specified counseling: Secondary | ICD-10-CM | POA: Diagnosis not present

## 2018-12-05 DIAGNOSIS — Z0000 Encounter for general adult medical examination without abnormal findings: Secondary | ICD-10-CM | POA: Diagnosis not present

## 2018-12-05 DIAGNOSIS — Z789 Other specified health status: Secondary | ICD-10-CM | POA: Diagnosis not present

## 2018-12-05 DIAGNOSIS — R739 Hyperglycemia, unspecified: Secondary | ICD-10-CM | POA: Diagnosis not present

## 2018-12-05 DIAGNOSIS — R7303 Prediabetes: Secondary | ICD-10-CM

## 2018-12-05 DIAGNOSIS — E538 Deficiency of other specified B group vitamins: Secondary | ICD-10-CM

## 2018-12-05 DIAGNOSIS — E2839 Other primary ovarian failure: Secondary | ICD-10-CM | POA: Diagnosis not present

## 2018-12-05 DIAGNOSIS — I10 Essential (primary) hypertension: Secondary | ICD-10-CM

## 2018-12-05 DIAGNOSIS — D582 Other hemoglobinopathies: Secondary | ICD-10-CM | POA: Diagnosis not present

## 2018-12-05 DIAGNOSIS — E782 Mixed hyperlipidemia: Secondary | ICD-10-CM | POA: Diagnosis not present

## 2018-12-05 DIAGNOSIS — E559 Vitamin D deficiency, unspecified: Secondary | ICD-10-CM | POA: Diagnosis not present

## 2018-12-05 MED ORDER — HYDROCHLOROTHIAZIDE 12.5 MG PO TABS: 12.5000 mg | ORAL_TABLET | Freq: Every day | ORAL | 0 refills | Status: DC

## 2018-12-05 NOTE — Progress Notes (Signed)
Impression and Recommendations:    1. Encounter for wellness examination   2. Counseling on health promotion and disease prevention   3. Health care maintenance   4. Essential hypertension   5. Mixed hyperlipidemia   6. Pre-diabetes   7. Statin intolerance- elevated LFT's and severe myalgias   8. Elevated blood sugar level   9. Elevated hemoglobin (HCC)   10. B12 deficiency   11. Estrogen deficiency   12. Vitamin D deficiency      1) Anticipatory Guidance: Discussed importance of wearing a seatbelt while driving, not texting while driving; sunscreen when outside along with yearly skin surveillance; eating a well balanced and modest diet; physical activity at least 25 minutes per day or 150 min/ week of moderate to intense activity.  2) Immunizations / Screenings / Labs:  All immunizations and screenings that patient agrees to, are up-to-date per recommendations or will be updated today.  Patient understands the needs for q 80mo dental and yearly vision screens which pt will schedule independently. Obtain CBC, CMP, HgA1c, Lipid panel, TSH and vit D when fasting if not already done recently.   3) Weight:   Discussed goal of losing even 5-10% of current body weight which would improve overall feelings of well being and improve objective health data significantly.   Improve nutrient density of diet through increasing intake of fruits and vegetables and decreasing saturated/trans fats, white flour products and refined sugar products.   4) hypertension: Blood pressure at home has been on the low side and patient at times feels little dizzy.  We will decrease her hydrochlorothiazide to 12.5 mg daily.  She will continue to monitor/keep a log and we will follow-up 1 to 2 months on this.   Meds ordered this encounter  Medications  . hydrochlorothiazide (HYDRODIURIL) 12.5 MG tablet    Sig: Take 1 tablet (12.5 mg total) by mouth daily.    Dispense:  90 tablet    Refill:  0    Orders  Placed This Encounter  Procedures  . DG Bone Density  . CBC with Differential/Platelet  . Comprehensive metabolic panel  . Hemoglobin A1c  . Lipid panel  . VITAMIN D 25 Hydroxy (Vit-D Deficiency, Fractures)  . TSH  . T4, free  . Vitamin B12    Gross side effects, risk and benefits, and alternatives of medications discussed with patient.  Patient is aware that all medications have potential side effects and we are unable to predict every side effect or drug-drug interaction that may occur.  Expresses verbal understanding and consents to current therapy plan and treatment regimen.  F-up preventative CPE in 1 year. F/up sooner for chronic care management as discussed and/or prn.  Please see orders placed and AVS handed out to patient at the end of our visit for further patient instructions/ counseling done pertaining to today's office visit.   Mellody Dance, DO 8:52 AM 12/05/2018    Subjective:    Chief Complaint  Patient presents with  . Annual Exam    HPI: Misty Decker is a 63 y.o. female who presents to [place] at [facility] today a yearly health maintenance exam.  Health Maintenance Summary Reviewed and updated, unless pt declines services.  Colonoscopy:    Up-to-date Tobacco History Reviewed:   Y - greater than 30 pk yr hx but quit greater than 15 yrs ago. CT scan for screening lung CA:   n/a Abdominal Ultrasound:   n/a Alcohol:  No concerns, no excessive use Exercise Habits:   Not meeting AHA guidelines STD concerns:   none Drug Use:   None Birth control method:   N/a-  status post hysterectomy Menses regular:     n/a Lumps or breast concerns:      no Breast Cancer Family History:      No Bone/ DEXA scan:   Patient had it in the past greater than 2 years ago we will schedule  Immunization History  Administered Date(s) Administered  . Hepatitis A, Adult 10/04/2014, 11/02/2014  . Hepatitis B, ped/adol 10/04/2014, 11/02/2014, 04/04/2015   . Influenza Split 02/27/2011, 03/22/2012  . Influenza Whole 02/23/2008, 05/16/2009, 02/19/2010  . Influenza,inj,Quad PF,6+ Mos 03/20/2013, 04/26/2014, 04/04/2015, 03/10/2016, 03/23/2017, 03/28/2018  . Pneumococcal Polysaccharide-23 01/21/2016  . Tdap 05/05/2011    Health Maintenance  Topic Date Due  . INFLUENZA VACCINE  01/07/2019  . MAMMOGRAM  10/16/2020  . PAP SMEAR-Modifier  11/12/2020  . TETANUS/TDAP  05/04/2021  . COLONOSCOPY  10/21/2022  . Hepatitis C Screening  Completed  . HIV Screening  Completed     Wt Readings from Last 3 Encounters:  12/05/18 179 lb 6.4 oz (81.4 kg)  09/12/18 170 lb (77.1 kg)  03/28/18 170 lb 4.8 oz (77.2 kg)   BP Readings from Last 3 Encounters:  12/05/18 110/86  03/28/18 117/61  11/12/17 115/72   Pulse Readings from Last 3 Encounters:  12/05/18 76  03/28/18 76  11/12/17 63     Past Medical History:  Diagnosis Date  . Complication of anesthesia    "Hard to sedate", per pt  . Degenerative joint disease    s/p cervical and lumbar fusions  . Depression   . Encounter for preventive health examination    Next mammogram in 07/2009. Next colonoscopy in 09/2017. DEXA 4/09>Normal  . Encounter for screening colonoscopy 10/03/2007   Normal  . Fibromyalgia   . Hemorrhoid    hx of  . Hyperlipidemia   . Hypertension   . Sciatica       Past Surgical History:  Procedure Laterality Date  . CERVICAL FUSION  2010   X 2  . LUMBAR FUSION  1997   X 2/has plate and screws and cage  . OVARIAN CYST SURGERY  1980  . pelvic prolapse  1987  . RECTAL PROLAPSE REPAIR    . TONSILLECTOMY     and adenoids  . TOTAL ABDOMINAL HYSTERECTOMY  1987      Family History  Problem Relation Age of Onset  . Cancer Mother   . Diabetes Mother   . Breast cancer Mother   . Heart attack Mother   . Hyperlipidemia Mother   . Hyperlipidemia Father   . Hypertension Father   . Aneurysm Father   . Dementia Maternal Grandfather   . Cancer Paternal Grandmother         intestinal      Social History   Substance and Sexual Activity  Drug Use No  ,   Social History   Substance and Sexual Activity  Alcohol Use Not Currently  . Alcohol/week: 0.0 standard drinks  ,   Social History   Tobacco Use  Smoking Status Former Smoker  . Packs/day: 2.00  . Years: 30.00  . Pack years: 60.00  . Types: Cigarettes  . Quit date: 04/03/2003  . Years since quitting: 15.6  Smokeless Tobacco Never Used  ,   Social History   Substance and Sexual Activity  Sexual Activity Not Currently  Current Outpatient Medications on File Prior to Visit  Medication Sig Dispense Refill  . Ascorbic Acid (VITAMIN C GUMMIE PO) Take 2 each by mouth daily.    . Calcium Carbonate-Vitamin D (CALCIUM-VITAMIN D) 600-125 MG-UNIT TABS Take 2 tablets by mouth 2 (two) times daily.     . Cinnamon 500 MG capsule Take 1,000 mg by mouth daily.    . Coenzyme Q10 (COQ-10 PO) Take 1 tablet by mouth daily.    . diphenhydrAMINE (BENADRYL) 25 MG tablet Take 12.5 mg by mouth at bedtime.     . Glucosamine-Chondroit-Vit C-Mn (GLUCOSAMINE 1500 COMPLEX PO) Take 1 tablet by mouth.    . Melatonin 5 MG CAPS Take 1 capsule by mouth at bedtime as needed.     . Multiple Vitamin (MULTIVITAMIN) capsule Take 1 capsule by mouth daily.      . Probiotic Product (PROBIOTIC DAILY PO) Take 1 tablet by mouth daily.    . psyllium (METAMUCIL) 58.6 % powder Take 1 packet by mouth. Take 1 tsp daily    . TURMERIC PO Take 1 tablet by mouth daily.    . Vitamins-Lipotropics (BALANCED B-50 COMPLEX PO) Take 1 tablet by mouth daily.     Current Facility-Administered Medications on File Prior to Visit  Medication Dose Route Frequency Provider Last Rate Last Dose  . 0.9 %  sodium chloride infusion  500 mL Intravenous Once Armbruster, Carlota Raspberry, MD        Allergies: Codeine, Morphine, Beef-derived products, Doxycycline, Ibuprofen, Latex, Nsaids, Penicillins, Pork-derived products, and Tramadol  Review of  Systems: General:   Denies fever, chills, unexplained weight loss.  Optho/Auditory:   Denies visual changes, blurred vision/LOV Respiratory:   Denies SOB, DOE more than baseline levels.  Cardiovascular:   Denies chest pain, palpitations, new onset peripheral edema  Gastrointestinal:   Denies nausea, vomiting, diarrhea.  Genitourinary: Denies dysuria, freq/ urgency, flank pain or discharge from genitals.  Endocrine:     Denies hot or cold intolerance, polyuria, polydipsia. Musculoskeletal:   Denies unexplained myalgias, joint swelling, unexplained arthralgias, gait problems.  Skin:  Denies rash, suspicious lesions Neurological:     Denies dizziness, unexplained weakness, numbness  Psychiatric/Behavioral:   Denies mood changes, suicidal or homicidal ideations, hallucinations    Objective:    Blood pressure 110/86, pulse 76, temperature 98.3 F (36.8 C), height 5\' 3"  (1.6 m), weight 179 lb 6.4 oz (81.4 kg), SpO2 97 %. Body mass index is 31.78 kg/m. General Appearance:    Alert, cooperative, no distress, appears stated age  Head:    Normocephalic, without obvious abnormality, atraumatic  Eyes:    PERRL, conjunctiva/corneas clear, EOM's intact, fundi    benign, both eyes  Ears:    Normal TM's and external ear canals, both ears  Nose:   Nares normal, septum midline, mucosa normal, no drainage    or sinus tenderness  Throat:   Lips w/o lesion, mucosa moist, and tongue normal; teeth and   gums normal  Neck:   Supple, symmetrical, trachea midline, no adenopathy;    thyroid:  no enlargement/tenderness/nodules; no carotid   bruit or JVD  Back:     Symmetric, no curvature, ROM normal, no CVA tenderness  Lungs:     Clear to auscultation bilaterally, respirations unlabored, no       Wh/ R/ R  Chest Wall:    No tenderness or gross deformity; normal excursion   Heart:    Regular rate and rhythm, S1 and S2 normal, no murmur, rub  or gallop  Breast Exam:    No tenderness, masses, or nipple  abnormality b/l; no d/c  Abdomen:     Soft, non-tender, bowel sounds active all four quadrants, NO   G/R/R, no masses, no organomegaly  Genitalia:   not done   Rectal:      deferred  Extremities:   Extremities normal, atraumatic, no cyanosis or gross edema  Pulses:   2+ and symmetric all extremities  Skin:   Warm, dry, Skin color, texture, turgor normal, no obvious rashes or lesions Psych: No HI/SI, judgement and insight good, Euthymic mood. Full Affect.  Neurologic:   CNII-XII intact, normal strength, sensation and reflexes    Throughout

## 2018-12-05 NOTE — Patient Instructions (Addendum)
Please make sure you cut your blood pressure pill in half-the hydrochlorothiazide so that you are only taking 12.5 mg daily.  -Blood pressure should be running 130/80 or less at home if it is running 110 over 60s or less and or your dizzy and please let me know   Preventive Care for Adults, Female  A healthy lifestyle and preventive care can promote health and wellness. Preventive health guidelines for women include the following key practices.   A routine yearly physical is a good way to check with your health care provider about your health and preventive screening. It is a chance to share any concerns and updates on your health and to receive a thorough exam.   Visit your dentist for a routine exam and preventive care every 6 months. Brush your teeth twice a day and floss once a day. Good oral hygiene prevents tooth decay and gum disease.   The frequency of eye exams is based on your age, health, family medical history, use of contact lenses, and other factors. Follow your health care provider's recommendations for frequency of eye exams.   Eat a healthy diet. Foods like vegetables, fruits, whole grains, low-fat dairy products, and lean protein foods contain the nutrients you need without too many calories. Decrease your intake of foods high in solid fats, added sugars, and salt. Eat the right amount of calories for you.Get information about a proper diet from your health care provider, if necessary.   Regular physical exercise is one of the most important things you can do for your health. Most adults should get at least 150 minutes of moderate-intensity exercise (any activity that increases your heart rate and causes you to sweat) each week. In addition, most adults need muscle-strengthening exercises on 2 or more days a week.   Maintain a healthy weight. The body mass index (BMI) is a screening tool to identify possible weight problems. It provides an estimate of body fat based on  height and weight. Your health care provider can find your BMI, and can help you achieve or maintain a healthy weight.For adults 20 years and older:   - A BMI below 18.5 is considered underweight.   - A BMI of 18.5 to 24.9 is normal.   - A BMI of 25 to 29.9 is considered overweight.   - A BMI of 30 and above is considered obese.   Maintain normal blood lipids and cholesterol levels by exercising and minimizing your intake of trans and saturated fats.  Eat a balanced diet with plenty of fruit and vegetables. Blood tests for lipids and cholesterol should begin at age 3 and be repeated every 5 years minimum.  If your lipid or cholesterol levels are high, you are over 40, or you are at high risk for heart disease, you may need your cholesterol levels checked more frequently.Ongoing high lipid and cholesterol levels should be treated with medicines if diet and exercise are not working.   If you smoke, find out from your health care provider how to quit. If you do not use tobacco, do not start.   Lung cancer screening is recommended for adults aged 40-80 years who are at high risk for developing lung cancer because of a history of smoking. A yearly low-dose CT scan of the lungs is recommended for people who have at least a 30-pack-year history of smoking and are a current smoker or have quit within the past 15 years. A pack year of smoking is smoking an average  of 1 pack of cigarettes a day for 1 year (for example: 1 pack a day for 30 years or 2 packs a day for 15 years). Yearly screening should continue until the smoker has stopped smoking for at least 15 years. Yearly screening should be stopped for people who develop a health problem that would prevent them from having lung cancer treatment.   If you are pregnant, do not drink alcohol. If you are breastfeeding, be very cautious about drinking alcohol. If you are not pregnant and choose to drink alcohol, do not have more than 1 drink per day. One  drink is considered to be 12 ounces (355 mL) of beer, 5 ounces (148 mL) of wine, or 1.5 ounces (44 mL) of liquor.   Avoid use of street drugs. Do not share needles with anyone. Ask for help if you need support or instructions about stopping the use of drugs.   High blood pressure causes heart disease and increases the risk of stroke. Your blood pressure should be checked at least yearly.  Ongoing high blood pressure should be treated with medicines if weight loss and exercise do not work.   If you are 27-67 years old, ask your health care provider if you should take aspirin to prevent strokes.   Diabetes screening involves taking a blood sample to check your fasting blood sugar level. This should be done once every 3 years, after age 60, if you are within normal weight and without risk factors for diabetes. Testing should be considered at a younger age or be carried out more frequently if you are overweight and have at least 1 risk factor for diabetes.   Breast cancer screening is essential preventive care for women. You should practice "breast self-awareness."  This means understanding the normal appearance and feel of your breasts and may include breast self-examination.  Any changes detected, no matter how small, should be reported to a health care provider.  Women in their 12s and 30s should have a clinical breast exam (CBE) by a health care provider as part of a regular health exam every 1 to 3 years.  After age 7, women should have a CBE every year.  Starting at age 36, women should consider having a mammogram (breast X-ray test) every year.  Women who have a family history of breast cancer should talk to their health care provider about genetic screening.  Women at a high risk of breast cancer should talk to their health care providers about having an MRI and a mammogram every year.   -Breast cancer gene (BRCA)-related cancer risk assessment is recommended for women who have family members  with BRCA-related cancers. BRCA-related cancers include breast, ovarian, tubal, and peritoneal cancers. Having family members with these cancers may be associated with an increased risk for harmful changes (mutations) in the breast cancer genes BRCA1 and BRCA2. Results of the assessment will determine the need for genetic counseling and BRCA1 and BRCA2 testing.   The Pap test is a screening test for cervical cancer. A Pap test can show cell changes on the cervix that might become cervical cancer if left untreated. A Pap test is a procedure in which cells are obtained and examined from the lower end of the uterus (cervix).   - Women should have a Pap test starting at age 12.   - Between ages 28 and 58, Pap tests should be repeated every 2 years.   - Beginning at age 41, you should have a Pap test  every 3 years as long as the past 3 Pap tests have been normal.   - Some women have medical problems that increase the chance of getting cervical cancer. Talk to your health care provider about these problems. It is especially important to talk to your health care provider if a new problem develops soon after your last Pap test. In these cases, your health care provider may recommend more frequent screening and Pap tests.   - The above recommendations are the same for women who have or have not gotten the vaccine for human papillomavirus (HPV).   - If you had a hysterectomy for a problem that was not cancer or a condition that could lead to cancer, then you no longer need Pap tests. Even if you no longer need a Pap test, a regular exam is a good idea to make sure no other problems are starting.   - If you are between ages 28 and 50 years, and you have had normal Pap tests going back 10 years, you no longer need Pap tests. Even if you no longer need a Pap test, a regular exam is a good idea to make sure no other problems are starting.   - If you have had past treatment for cervical cancer or a condition  that could lead to cancer, you need Pap tests and screening for cancer for at least 20 years after your treatment.   - If Pap tests have been discontinued, risk factors (such as a new sexual partner) need to be reassessed to determine if screening should be resumed.   - The HPV test is an additional test that may be used for cervical cancer screening. The HPV test looks for the virus that can cause the cell changes on the cervix. The cells collected during the Pap test can be tested for HPV. The HPV test could be used to screen women aged 54 years and older, and should be used in women of any age who have unclear Pap test results. After the age of 61, women should have HPV testing at the same frequency as a Pap test.   Colorectal cancer can be detected and often prevented. Most routine colorectal cancer screening begins at the age of 83 years and continues through age 49 years. However, your health care provider may recommend screening at an earlier age if you have risk factors for colon cancer. On a yearly basis, your health care provider may provide home test kits to check for hidden blood in the stool.  Use of a small camera at the end of a tube, to directly examine the colon (sigmoidoscopy or colonoscopy), can detect the earliest forms of colorectal cancer. Talk to your health care provider about this at age 16, when routine screening begins. Direct exam of the colon should be repeated every 5 -10 years through age 7 years, unless early forms of pre-cancerous polyps or small growths are found.   People who are at an increased risk for hepatitis B should be screened for this virus. You are considered at high risk for hepatitis B if:  -You were born in a country where hepatitis B occurs often. Talk with your health care provider about which countries are considered high risk.  - Your parents were born in a high-risk country and you have not received a shot to protect against hepatitis B (hepatitis B  vaccine).  - You have HIV or AIDS.  - You use needles to inject street drugs.  -  You live with, or have sex with, someone who has Hepatitis B.  - You get hemodialysis treatment.  - You take certain medicines for conditions like cancer, organ transplantation, and autoimmune conditions.   Hepatitis C blood testing is recommended for all people born from [birth year] through 1965 and any individual with known risks for hepatitis C.   Practice safe sex. Use condoms and avoid high-risk sexual practices to reduce the spread of sexually transmitted infections (STIs). STIs include gonorrhea, chlamydia, syphilis, trichomonas, herpes, HPV, and human immunodeficiency virus (HIV). Herpes, HIV, and HPV are viral illnesses that have no cure. They can result in disability, cancer, and death. Sexually active women aged 39 years and younger should be checked for chlamydia. Older women with new or multiple partners should also be tested for chlamydia. Testing for other STIs is recommended if you are sexually active and at increased risk.   Osteoporosis is a disease in which the bones lose minerals and strength with aging. This can result in serious bone fractures or breaks. The risk of osteoporosis can be identified using a bone density scan. Women ages 26 years and over and women at risk for fractures or osteoporosis should discuss screening with their health care providers. Ask your health care provider whether you should take a calcium supplement or vitamin D to There are also several preventive steps women can take to avoid osteoporosis and resulting fractures or to keep osteoporosis from worsening. -->Recommendations include:  Eat a balanced diet high in fruits, vegetables, calcium, and vitamins.  Get enough calcium. The recommended total intake of is 1,200 mg daily; for best absorption, if taking supplements, divide doses into 250-500 mg doses throughout the day. Of the two types of calcium, calcium carbonate is best  absorbed when taken with food but calcium citrate can be taken on an empty stomach.  Get enough vitamin D. [employer] and the [place] recommend at least 1,000 IU per day for women age 59 and over who are at risk of vitamin D deficiency. Vitamin D deficiency can be caused by inadequate sun exposure (for example, those who live in [place]).  Avoid alcohol and smoking. Heavy alcohol intake (more than 7 drinks per week) increases the risk of falls and hip fracture and women smokers tend to lose bone more rapidly and have lower bone mass than nonsmokers. Stopping smoking is one of the most important changes women can make to improve their health and decrease risk for disease.  Be physically active every day. Weight-bearing exercise (for example, fast walking, hiking, jogging, and weight training) may strengthen bones or slow the rate of bone loss that comes with aging. Balancing and muscle-strengthening exercises can reduce the risk of falling and fracture.  Consider therapeutic medications. Currently, several types of effective drugs are available. Healthcare providers can recommend the type most appropriate for each woman.  Eliminate environmental factors that may contribute to accidents. Falls cause nearly 90% of all osteoporotic fractures, so reducing this risk is an important bone-health strategy. Measures include ample lighting, removing obstructions to walking, using nonskid rugs on floors, and placing mats and/or grab bars in showers.  Be aware of medication side effects. Some common medicines make bones weaker. These include a type of steroid drug called glucocorticoids used for arthritis and asthma, some antiseizure drugs, certain sleeping pills, treatments for endometriosis, and some cancer drugs. An overactive thyroid gland or using too much thyroid hormone for an underactive thyroid can also be a problem. If you are  taking these medicines, talk to your doctor about  what you can do to help protect your bones.reduce the rate of osteoporosis.    Menopause can be associated with physical symptoms and risks. Hormone replacement therapy is available to decrease symptoms and risks. You should talk to your health care provider about whether hormone replacement therapy is right for you.   Use sunscreen. Apply sunscreen liberally and repeatedly throughout the day. You should seek shade when your shadow is shorter than you. Protect yourself by wearing long sleeves, pants, a wide-brimmed hat, and sunglasses year round, whenever you are outdoors.   Once a month, do a whole body skin exam, using a mirror to look at the skin on your back. Tell your health care provider of new moles, moles that have irregular borders, moles that are larger than a pencil eraser, or moles that have changed in shape or color.   -Stay current with required vaccines (immunizations).   Influenza vaccine. All adults should be immunized every year.  Tetanus, diphtheria, and acellular pertussis (Td, Tdap) vaccine. Pregnant women should receive 1 dose of Tdap vaccine during each pregnancy. The dose should be obtained regardless of the length of time since the last dose. Immunization is preferred during the 27th 36th week of gestation. An adult who has not previously received Tdap or who does not know her vaccine status should receive 1 dose of Tdap. This initial dose should be followed by tetanus and diphtheria toxoids (Td) booster doses every 10 years. Adults with an unknown or incomplete history of completing a 3-dose immunization series with Td-containing vaccines should begin or complete a primary immunization series including a Tdap dose. Adults should receive a Td booster every 10 years.  Varicella vaccine. An adult without evidence of immunity to varicella should receive 2 doses or a second dose if she has previously received 1 dose. Pregnant females who do not have evidence of immunity  should receive the first dose after pregnancy. This first dose should be obtained before leaving the health care facility. The second dose should be obtained 4 8 weeks after the first dose.  Human papillomavirus (HPV) vaccine. Females aged 47 26 years who have not received the vaccine previously should obtain the 3-dose series. The vaccine is not recommended for use in pregnant females. However, pregnancy testing is not needed before receiving a dose. If a female is found to be pregnant after receiving a dose, no treatment is needed. In that case, the remaining doses should be delayed until after the pregnancy. Immunization is recommended for any person with an immunocompromised condition through the age of 35 years if she did not get any or all doses earlier. During the 3-dose series, the second dose should be obtained 4 8 weeks after the first dose. The third dose should be obtained 24 weeks after the first dose and 16 weeks after the second dose.  Zoster vaccine. One dose is recommended for adults aged 66 years or older unless certain conditions are present.  Measles, mumps, and rubella (MMR) vaccine. Adults born before 3 generally are considered immune to measles and mumps. Adults born in [birth year] or later should have 1 or more doses of MMR vaccine unless there is a contraindication to the vaccine or there is laboratory evidence of immunity to each of the three diseases. A routine second dose of MMR vaccine should be obtained at least 28 days after the first dose for students attending postsecondary schools, health care workers, or international travelers. People  who received inactivated measles vaccine or an unknown type of measles vaccine during 1963 1967 should receive 2 doses of MMR vaccine. People who received inactivated mumps vaccine or an unknown type of mumps vaccine before 1979 and are at high risk for mumps infection should consider immunization with 2 doses of MMR vaccine. For females of  childbearing age, rubella immunity should be determined. If there is no evidence of immunity, females who are not pregnant should be vaccinated. If there is no evidence of immunity, females who are pregnant should delay immunization until after pregnancy. Unvaccinated health care workers born before [birth year] who lack laboratory evidence of measles, mumps, or rubella immunity or laboratory confirmation of disease should consider measles and mumps immunization with 2 doses of MMR vaccine or rubella immunization with 1 dose of MMR vaccine.  Pneumococcal 13-valent conjugate (PCV13) vaccine. When indicated, a person who is uncertain of her immunization history and has no record of immunization should receive the PCV13 vaccine. An adult aged 81 years or older who has certain medical conditions and has not been previously immunized should receive 1 dose of PCV13 vaccine. This PCV13 should be followed with a dose of pneumococcal polysaccharide (PPSV23) vaccine. The PPSV23 vaccine dose should be obtained at least 8 weeks after the dose of PCV13 vaccine. An adult aged 2 years or older who has certain medical conditions and previously received 1 or more doses of PPSV23 vaccine should receive 1 dose of PCV13. The PCV13 vaccine dose should be obtained 1 or more years after the last PPSV23 vaccine dose.  Pneumococcal polysaccharide (PPSV23) vaccine. When PCV13 is also indicated, PCV13 should be obtained first. All adults aged 73 years and older should be immunized. An adult younger than age 53 years who has certain medical conditions should be immunized. Any person who resides in a nursing home or long-term care facility should be immunized. An adult smoker should be immunized. People with an immunocompromised condition and certain other conditions should receive both PCV13 and PPSV23 vaccines. People with human immunodeficiency virus (HIV) infection should be immunized as soon as possible after diagnosis. Immunization during  chemotherapy or radiation therapy should be avoided. Routine use of PPSV23 vaccine is not recommended for American Indians, [place] Natives, or people younger than 65 years unless there are medical conditions that require PPSV23 vaccine. When indicated, people who have unknown immunization and have no record of immunization should receive PPSV23 vaccine. One-time revaccination 5 years after the first dose of PPSV23 is recommended for people aged 52 64 years who have chronic kidney failure, nephrotic syndrome, asplenia, or immunocompromised conditions. People who received 1 2 doses of PPSV23 before age 48 years should receive another dose of PPSV23 vaccine at age 48 years or later if at least 5 years have passed since the previous dose. Doses of PPSV23 are not needed for people immunized with PPSV23 at or after age 59 years.  Meningococcal vaccine. Adults with asplenia or persistent complement component deficiencies should receive 2 doses of quadrivalent meningococcal conjugate (MenACWY-D) vaccine. The doses should be obtained at least 2 months apart. Microbiologists working with certain meningococcal bacteria, [employer] recruits, people at risk during an outbreak, and people who travel to or live in countries with a high rate of meningitis should be immunized. A first-year college student up through age 86 years who is living in a residence hall should receive a dose if she did not receive a dose on or after her 16th birthday. Adults who have certain high-risk conditions  should receive one or more doses of vaccine.  Hepatitis A vaccine. Adults who wish to be protected from this disease, have certain high-risk conditions, work with hepatitis A-infected animals, work in hepatitis A research labs, or travel to or work in countries with a high rate of hepatitis A should be immunized. Adults who were previously unvaccinated and who anticipate close contact with an international adoptee during the first 60 days after  arrival in the [place] from a country with a high rate of hepatitis A should be immunized.  Hepatitis B vaccine.  Adults who wish to be protected from this disease, have certain high-risk conditions, may be exposed to blood or other infectious body fluids, are household contacts or sex partners of hepatitis B positive people, are clients or workers in certain care facilities, or travel to or work in countries with a high rate of hepatitis B should be immunized.  Haemophilus influenzae type b (Hib) vaccine. A previously unvaccinated person with asplenia or sickle cell disease or having a scheduled splenectomy should receive 1 dose of Hib vaccine. Regardless of previous immunization, a recipient of a hematopoietic stem cell transplant should receive a 3-dose series 6 12 months after her successful transplant. Hib vaccine is not recommended for adults with HIV infection.  Preventive Services / Frequency Ages 62 to 39years  Blood pressure check.** / Every 1 to 2 years.  Lipid and cholesterol check.** / Every 5 years beginning at age 80.  Clinical breast exam.** / Every 3 years for women in their 21s and 67s.  BRCA-related cancer risk assessment.** / For women who have family members with a BRCA-related cancer (breast, ovarian, tubal, or peritoneal cancers).  Pap test.** / Every 2 years from ages 43 through 81. Every 3 years starting at age 80 through age 52 or 82 with a history of 3 consecutive normal Pap tests.  HPV screening.** / Every 3 years from ages 39 through ages 69 to 12 with a history of 3 consecutive normal Pap tests.  Hepatitis C blood test.** / For any individual with known risks for hepatitis C.  Skin self-exam. / Monthly.  Influenza vaccine. / Every year.  Tetanus, diphtheria, and acellular pertussis (Tdap, Td) vaccine.** / Consult your health care provider. Pregnant women should receive 1 dose of Tdap vaccine during each pregnancy. 1 dose of Td every 10  years.  Varicella vaccine.** / Consult your health care provider. Pregnant females who do not have evidence of immunity should receive the first dose after pregnancy.  HPV vaccine. / 3 doses over 6 months, if 59 and younger. The vaccine is not recommended for use in pregnant females. However, pregnancy testing is not needed before receiving a dose.  Measles, mumps, rubella (MMR) vaccine.** / You need at least 1 dose of MMR if you were born in 1957 or later. You may also need a 2nd dose. For females of childbearing age, rubella immunity should be determined. If there is no evidence of immunity, females who are not pregnant should be vaccinated. If there is no evidence of immunity, females who are pregnant should delay immunization until after pregnancy.  Pneumococcal 13-valent conjugate (PCV13) vaccine.** / Consult your health care provider.  Pneumococcal polysaccharide (PPSV23) vaccine.** / 1 to 2 doses if you smoke cigarettes or if you have certain conditions.  Meningococcal vaccine.** / 1 dose if you are age 54 to 52 years and a Market researcher living in a residence hall, or have one of several medical conditions, you  need to get vaccinated against meningococcal disease. You may also need additional booster doses.  Hepatitis A vaccine.** / Consult your health care provider.  Hepatitis B vaccine.** / Consult your health care provider.  Haemophilus influenzae type b (Hib) vaccine.** / Consult your health care provider.  Ages 40 to 64years  Blood pressure check.** / Every 1 to 2 years.  Lipid and cholesterol check.** / Every 5 years beginning at age 20 years.  Lung cancer screening. / Every year if you are aged 55 80 years and have a 30-pack-year history of smoking and currently smoke or have quit within the past 15 years. Yearly screening is stopped once you have quit smoking for at least 15 years or develop a health problem that would prevent you from having lung cancer  treatment.  Clinical breast exam.** / Every year after age 40 years.  BRCA-related cancer risk assessment.** / For women who have family members with a BRCA-related cancer (breast, ovarian, tubal, or peritoneal cancers).  Mammogram.** / Every year beginning at age 40 years and continuing for as long as you are in good health. Consult with your health care provider.  Pap test.** / Every 3 years starting at age 30 years through age 65 or 70 years with a history of 3 consecutive normal Pap tests.  HPV screening.** / Every 3 years from ages 30 years through ages 65 to 70 years with a history of 3 consecutive normal Pap tests.  Fecal occult blood test (FOBT) of stool. / Every year beginning at age 50 years and continuing until age 75 years. You may not need to do this test if you get a colonoscopy every 10 years.  Flexible sigmoidoscopy or colonoscopy.** / Every 5 years for a flexible sigmoidoscopy or every 10 years for a colonoscopy beginning at age 50 years and continuing until age 75 years.  Hepatitis C blood test.** / For all people born from 1945 through 1965 and any individual with known risks for hepatitis C.  Skin self-exam. / Monthly.  Influenza vaccine. / Every year.  Tetanus, diphtheria, and acellular pertussis (Tdap/Td) vaccine.** / Consult your health care provider. Pregnant women should receive 1 dose of Tdap vaccine during each pregnancy. 1 dose of Td every 10 years.  Varicella vaccine.** / Consult your health care provider. Pregnant females who do not have evidence of immunity should receive the first dose after pregnancy.  Zoster vaccine.** / 1 dose for adults aged 60 years or older.  Measles, mumps, rubella (MMR) vaccine.** / You need at least 1 dose of MMR if you were born in 1957 or later. You may also need a 2nd dose. For females of childbearing age, rubella immunity should be determined. If there is no evidence of immunity, females who are not pregnant should be  vaccinated. If there is no evidence of immunity, females who are pregnant should delay immunization until after pregnancy.  Pneumococcal 13-valent conjugate (PCV13) vaccine.** / Consult your health care provider.  Pneumococcal polysaccharide (PPSV23) vaccine.** / 1 to 2 doses if you smoke cigarettes or if you have certain conditions.  Meningococcal vaccine.** / Consult your health care provider.  Hepatitis A vaccine.** / Consult your health care provider.  Hepatitis B vaccine.** / Consult your health care provider.  Haemophilus influenzae type b (Hib) vaccine.** / Consult your health care provider.  Ages 65 years and over  Blood pressure check.** / Every 1 to 2 years.  Lipid and cholesterol check.** / Every 5 years beginning at age 20   years.  Lung cancer screening. / Every year if you are aged 55 80 years and have a 30-pack-year history of smoking and currently smoke or have quit within the past 15 years. Yearly screening is stopped once you have quit smoking for at least 15 years or develop a health problem that would prevent you from having lung cancer treatment.  Clinical breast exam.** / Every year after age 40 years.  BRCA-related cancer risk assessment.** / For women who have family members with a BRCA-related cancer (breast, ovarian, tubal, or peritoneal cancers).  Mammogram.** / Every year beginning at age 40 years and continuing for as long as you are in good health. Consult with your health care provider.  Pap test.** / Every 3 years starting at age 30 years through age 65 or 70 years with 3 consecutive normal Pap tests. Testing can be stopped between 65 and 70 years with 3 consecutive normal Pap tests and no abnormal Pap or HPV tests in the past 10 years.  HPV screening.** / Every 3 years from ages 30 years through ages 65 or 70 years with a history of 3 consecutive normal Pap tests. Testing can be stopped between 65 and 70 years with 3 consecutive normal Pap tests and no  abnormal Pap or HPV tests in the past 10 years.  Fecal occult blood test (FOBT) of stool. / Every year beginning at age 50 years and continuing until age 75 years. You may not need to do this test if you get a colonoscopy every 10 years.  Flexible sigmoidoscopy or colonoscopy.** / Every 5 years for a flexible sigmoidoscopy or every 10 years for a colonoscopy beginning at age 50 years and continuing until age 75 years.  Hepatitis C blood test.** / For all people born from 1945 through 1965 and any individual with known risks for hepatitis C.  Osteoporosis screening.** / A one-time screening for women ages 65 years and over and women at risk for fractures or osteoporosis.  Skin self-exam. / Monthly.  Influenza vaccine. / Every year.  Tetanus, diphtheria, and acellular pertussis (Tdap/Td) vaccine.** / 1 dose of Td every 10 years.  Varicella vaccine.** / Consult your health care provider.  Zoster vaccine.** / 1 dose for adults aged 60 years or older.  Pneumococcal 13-valent conjugate (PCV13) vaccine.** / Consult your health care provider.  Pneumococcal polysaccharide (PPSV23) vaccine.** / 1 dose for all adults aged 65 years and older.  Meningococcal vaccine.** / Consult your health care provider.  Hepatitis A vaccine.** / Consult your health care provider.  Hepatitis B vaccine.** / Consult your health care provider.  Haemophilus influenzae type b (Hib) vaccine.** / Consult your health care provider. ** Family history and personal history of risk and conditions may change your health care provider's recommendations. Document Released: 07/21/2001 Document Revised: 03/15/2013  [employer] Patient Information 2014 [employer]   EXERCISE AND DIET:  We recommended that you start or continue a regular exercise program for good health. Regular exercise means any activity that makes your heart beat faster and makes you sweat.  We recommend exercising at least 30 minutes per day at least 3  days a week, preferably 5.  We also recommend a diet low in fat and sugar / carbohydrates.  Inactivity, poor dietary choices and obesity can cause diabetes, heart attack, stroke, and kidney damage, among others.     ALCOHOL AND SMOKING:  Women should limit their alcohol intake to no more than 7 drinks/beers/glasses of wine (combined, not each!) per   week. Moderation of alcohol intake to this level decreases your risk of breast cancer and liver damage.  ( And of course, no recreational drugs are part of a healthy lifestyle.)  Also, you should not be smoking at all or even being exposed to second hand smoke. Most people know smoking can cause cancer, and various heart and lung diseases, but did you know it also contributes to weakening of your bones?  Aging of your skin?  Yellowing of your teeth and nails?   CALCIUM AND VITAMIN D:  Adequate intake of calcium and Vitamin D are recommended.  The recommendations for exact amounts of these supplements seem to change often, but generally speaking 600 mg of calcium (either carbonate or citrate) and 800 units of Vitamin D per day seems prudent. Certain women may benefit from higher intake of Vitamin D.  If you are among these women, your doctor will have told you during your visit.     PAP SMEARS:  Pap smears, to check for cervical cancer or precancers,  have traditionally been done yearly, although recent scientific advances have shown that most women can have pap smears less often.  However, every woman still should have a physical exam from her gynecologist or primary care physician every year. It will include a breast check, inspection of the vulva and vagina to check for abnormal growths or skin changes, a visual exam of the cervix, and then an exam to evaluate the size and shape of the uterus and ovaries.  And after 63 years of age, a rectal exam is indicated to check for rectal cancers. We will also provide age appropriate advice regarding health  maintenance, like when you should have certain vaccines, screening for sexually transmitted diseases, bone density testing, colonoscopy, mammograms, etc.    MAMMOGRAMS:  All women over 74 years old should have a yearly mammogram. Many facilities now offer a "3D" mammogram, which may cost around $50 extra out of pocket. If possible,  we recommend you accept the option to have the 3D mammogram performed.  It both reduces the number of women who will be called back for extra views which then turn out to be normal, and it is better than the routine mammogram at detecting truly abnormal areas.     COLONOSCOPY:  Colonoscopy to screen for colon cancer is recommended for all women at age 59.  We know, you hate the idea of the prep.  We agree, BUT, having colon cancer and not knowing it is worse!!  Colon cancer so often starts as a polyp that can be seen and removed at colonscopy, which can quite literally save your life!  And if your first colonoscopy is normal and you have no family history of colon cancer, most women don't have to have it again for 10 years.  Once every ten years, you can do something that may end up saving your life, right?  We will be happy to help you get it scheduled when you are ready.  Be sure to check your insurance coverage so you understand how much it will cost.  It may be covered as a preventative service at no cost, but you should check your particular policy.

## 2018-12-06 LAB — TSH: TSH: 2.77 u[IU]/mL (ref 0.450–4.500)

## 2018-12-06 LAB — COMPREHENSIVE METABOLIC PANEL
ALT: 17 IU/L (ref 0–32)
AST: 28 IU/L (ref 0–40)
Albumin/Globulin Ratio: 1.9 (ref 1.2–2.2)
Albumin: 4.8 g/dL (ref 3.8–4.8)
Alkaline Phosphatase: 84 IU/L (ref 39–117)
BUN/Creatinine Ratio: 23 (ref 12–28)
BUN: 17 mg/dL (ref 8–27)
Bilirubin Total: 0.5 mg/dL (ref 0.0–1.2)
CO2: 25 mmol/L (ref 20–29)
Calcium: 9.8 mg/dL (ref 8.7–10.3)
Chloride: 101 mmol/L (ref 96–106)
Creatinine, Ser: 0.73 mg/dL (ref 0.57–1.00)
GFR calc Af Amer: 102 mL/min/{1.73_m2} (ref >59)
GFR calc non Af Amer: 89 mL/min/{1.73_m2} (ref >59)
Globulin, Total: 2.5 g/dL (ref 1.5–4.5)
Glucose: 110 mg/dL — ABNORMAL HIGH (ref 65–99)
Potassium: 4.7 mmol/L (ref 3.5–5.2)
Sodium: 142 mmol/L (ref 134–144)
Total Protein: 7.3 g/dL (ref 6.0–8.5)

## 2018-12-06 LAB — CBC WITH DIFFERENTIAL/PLATELET
Basophils Absolute: 0.1 10*3/uL (ref 0.0–0.2)
Basos: 1 %
EOS (ABSOLUTE): 0.3 10*3/uL (ref 0.0–0.4)
Eos: 4 %
Hematocrit: 45.2 % (ref 34.0–46.6)
Hemoglobin: 15.7 g/dL (ref 11.1–15.9)
Immature Grans (Abs): 0 10*3/uL (ref 0.0–0.1)
Immature Granulocytes: 0 %
Lymphocytes Absolute: 2 10*3/uL (ref 0.7–3.1)
Lymphs: 29 %
MCH: 32.9 pg (ref 26.6–33.0)
MCHC: 34.7 g/dL (ref 31.5–35.7)
MCV: 95 fL (ref 79–97)
Monocytes Absolute: 0.4 10*3/uL (ref 0.1–0.9)
Monocytes: 6 %
Neutrophils Absolute: 4 10*3/uL (ref 1.4–7.0)
Neutrophils: 60 %
Platelets: 204 10*3/uL (ref 150–450)
RBC: 4.77 x10E6/uL (ref 3.77–5.28)
RDW: 12.4 % (ref 11.7–15.4)
WBC: 6.7 10*3/uL (ref 3.4–10.8)

## 2018-12-06 LAB — LIPID PANEL
Chol/HDL Ratio: 3.9 ratio (ref 0.0–4.4)
Cholesterol, Total: 205 mg/dL — ABNORMAL HIGH (ref 100–199)
HDL: 52 mg/dL (ref >39)
LDL Calculated: 117 mg/dL — ABNORMAL HIGH (ref 0–99)
Triglycerides: 178 mg/dL — ABNORMAL HIGH (ref 0–149)
VLDL Cholesterol Cal: 36 mg/dL (ref 5–40)

## 2018-12-06 LAB — HEMOGLOBIN A1C
Est. average glucose Bld gHb Est-mCnc: 108 mg/dL
Hgb A1c MFr Bld: 5.4 % (ref 4.8–5.6)

## 2018-12-06 LAB — T4, FREE: Free T4: 1.05 ng/dL (ref 0.82–1.77)

## 2018-12-06 LAB — VITAMIN D 25 HYDROXY (VIT D DEFICIENCY, FRACTURES): Vit D, 25-Hydroxy: 57.2 ng/mL (ref 30.0–100.0)

## 2018-12-06 LAB — VITAMIN B12: Vitamin B-12: 814 pg/mL (ref 232–1245)

## 2019-01-25 ENCOUNTER — Encounter: Payer: Self-pay | Admitting: Family Medicine

## 2019-01-25 ENCOUNTER — Other Ambulatory Visit: Payer: Self-pay

## 2019-01-25 ENCOUNTER — Ambulatory Visit (INDEPENDENT_AMBULATORY_CARE_PROVIDER_SITE_OTHER): Payer: Medicare HMO | Admitting: Family Medicine

## 2019-01-25 VITALS — BP 108/71 | HR 71 | Ht 63.0 in | Wt 173.0 lb

## 2019-01-25 DIAGNOSIS — Z789 Other specified health status: Secondary | ICD-10-CM

## 2019-01-25 DIAGNOSIS — E669 Obesity, unspecified: Secondary | ICD-10-CM | POA: Diagnosis not present

## 2019-01-25 DIAGNOSIS — E782 Mixed hyperlipidemia: Secondary | ICD-10-CM | POA: Diagnosis not present

## 2019-01-25 DIAGNOSIS — F341 Dysthymic disorder: Secondary | ICD-10-CM

## 2019-01-25 DIAGNOSIS — R7303 Prediabetes: Secondary | ICD-10-CM | POA: Diagnosis not present

## 2019-01-25 DIAGNOSIS — E559 Vitamin D deficiency, unspecified: Secondary | ICD-10-CM | POA: Diagnosis not present

## 2019-01-25 DIAGNOSIS — R69 Illness, unspecified: Secondary | ICD-10-CM | POA: Diagnosis not present

## 2019-01-25 DIAGNOSIS — Z9889 Other specified postprocedural states: Secondary | ICD-10-CM

## 2019-01-25 DIAGNOSIS — M545 Low back pain: Secondary | ICD-10-CM

## 2019-01-25 DIAGNOSIS — M797 Fibromyalgia: Secondary | ICD-10-CM | POA: Diagnosis not present

## 2019-01-25 DIAGNOSIS — I10 Essential (primary) hypertension: Secondary | ICD-10-CM | POA: Diagnosis not present

## 2019-01-25 DIAGNOSIS — G8929 Other chronic pain: Secondary | ICD-10-CM

## 2019-01-25 MED ORDER — HYDROCHLOROTHIAZIDE 12.5 MG PO TABS: 12.5000 mg | ORAL_TABLET | Freq: Every day | ORAL | 1 refills | Status: DC

## 2019-01-25 NOTE — Progress Notes (Signed)
Virtual / live video office visit note for [employer], D.O- Primary Care Physician at [facility]   I connected with current patient today and beyond visually recognizing the correct individual, I verified that I am speaking with the correct person using two identifiers.  . Location of the patient: Home . Location of the provider: Office Only the patient (+/- their family members at pt's discretion) and myself were participating in the encounter    - This visit type was conducted due to national recommendations for restrictions regarding the COVID-19 Pandemic (e.g. social distancing) in an effort to limit this patient's exposure and mitigate transmission in our community.  This format is felt to be most appropriate for this patient at this time.   - The patient did have access to video technology today yet, we had technical difficulties with this method, requiring transitioning to audio only.    - No physical exam could be performed with this format, beyond that communicated to [place] by the patient/ family members as noted.   - Additionally my office staff/ schedulers discussed with the patient that there may be a monetary charge related to this service, depending on patient's medical insurance.   The patient expressed understanding, and agreed to proceed.      History of Present Illness:  Says she's doing fine.  Feels her mood is okay.  Recently went camping with her grandson in [place].  Says she wasn't around anyone "just about."  Said she felt "kinda safe there."  "You try to find things to do around staying away from people."  Denies questions or concerns about COVID.  "There's nothing really we can do but wait this out."  Says she's worried about winter time when she's going to be stuck in the house, because right now she has her flowerbeds and garden to take care of.  - Back Pain, h/o Back Surgery 6 years ago Notes her hardware bothers her the most.  Says in the winter,  she feels it and it makes her chatter.  Notes "I hurt exactly on them [bars of the [place]."  Confirms that she does try to engage in activities like walking, but "there's nothing we can do about [the pain caused by] my hardware."  Patient states that she was told "years ago it was the best it would ever be and only go backwards."  States she "walks for hours."  1. HTN HPI:  -  Her blood pressure has been controlled at home.  Pt is checking it at home.   Says it mainly runs 112, 108.  A couple of times it was in the 80's around August 1, but "the next day it was back to 108 so I wasn't too worried."  Feels her BP is controlled.  Her pulse is running around 71, sometimes up to 79, the "lowest I see is 50."  Denies concerns, "they all look pretty normal to me."  - Patient reports good compliance with blood pressure medications  - Denies medication S-E.   - Smoking Status noted   - She denies new onset of: chest pain, exercise intolerance, shortness of breath, dizziness, visual changes, headache, lower extremity swelling or claudication.   Last 3 blood pressure readings in our office are as follows: BP Readings from Last 3 Encounters:  01/25/19 108/71  12/05/18 110/86  03/28/18 117/61    Filed Weights   01/25/19 0833  Weight: 173 lb (78.5 kg)    2.  63 y.o. female here for cholesterol follow-up.   States usually eating chicken or beans, and not eating a lot of friend foods. Notes she does try to avoid "treats."  For exercise states she's mainly been taking care of their property.  - She denies new onset of: chest pain, exercise intolerance, shortness of breath, dizziness, visual changes, headache, lower extremity swelling or claudication.   Denies new myalgias  The cholesterol last visit was:  Lab Results  Component Value Date   CHOL 205 (H) 12/05/2018   HDL 52 12/05/2018   LDLCALC 117 (H) 12/05/2018   TRIG 178 (H) 12/05/2018   CHOLHDL 3.9 12/05/2018     Hepatic Function Latest Ref Rng & Units 12/05/2018 11/12/2017 02/18/2017  Total Protein 6.0 - 8.5 g/dL 7.3 7.1 7.2  Albumin 3.8 - 4.8 g/dL 4.8 4.6 4.4  AST 0 - 40 IU/L 28 26 53(H)  ALT 0 - 32 IU/L 17 18 48(H)  Alk Phosphatase 39 - 117 IU/L 84 90 97  Total Bilirubin 0.0 - 1.2 mg/dL 0.5 0.7 0.6  Bilirubin, Direct 0.00 - 0.40 mg/dL - - -     Impression and Recommendations:    1. Essential hypertension   2. Mixed hyperlipidemia   3. Statin intolerance- elevated LFT's and severe myalgias   4. Pre-diabetes   5. Vitamin D deficiency   6. Dysthymic disorder   7. Obesity (BMI 30.0-34.9)   8. Fibromyalgia   9. Previous back surgery   10. Chronic bilateral low back pain without sciatica     Essential Hypertension - Stable on current treatment. - Continue treatment plan as prescribed.  See med list below. - Patient tolerating meds well without complication.  Denies S-E  - Ongoing prudent lifestyle changes such as dash diet and engaging in a regular exercise program discussed with patient.  Educational handouts provided  - Ambulatory BP monitoring encouraged. Keep log and bring in next OV.  - Will continue to monitor.  Hyperlipidemia - Statin Intolerance - Stable last check. - Dietary modifications and exercise discussed as first line of treatment. - Continue with prudent lifestyle habits as advised.  - Ongoing dietary changes such as low saturated & trans fat and low carb diets discussed with patient.  Encouraged regular exercise and weight loss when appropriate.   - Will continue to monitor.  Prediabetes - Advised patient to continue prudent lifestyle habits as advised. - Counseled patient on prevention of disease and discussed dietary and lifestyle modifications as first line.  Importance of low carb/ketogenic diet discussed with patient in addition to regular exercise.   - Will continue to monitor.  Chronic Bilateral Low Back Pain, h/o Back Surgery - Patient declines further  referral to physical therapy or specialty care today.  - Will continue to monitor.  Mood & Caregiver Stress - Patient denies concerns about her mood and declines medication today.  - Reviewed the "spokes of the wheel" of mood and health management.  Stressed the importance of ongoing prudent habits, including regular exercise, appropriate sleep hygiene, healthful dietary habits, and prayer/meditation to relax.  - Will continue to monitor.  Vitamin D Deficiency - Stable last check. - Continue Vitamin D supplementation as prescribed. - Will continue to monitor.  BMI Counseling - Body mass index is 30.65 kg/m Explained to patient what BMI refers to, and what it means medically.  Told patient to think about it as a "medical risk stratification measurement" and how increasing BMI is associated with increasing risk/ or worsening state of  various diseases such as hypertension, hyperlipidemia, diabetes, premature OA, depression etc.  - Encouraged patient to keep her weight down to continue to prevent worsening health conditions.  American Heart Association guidelines for healthy diet, basically Mediterranean diet, and exercise guidelines of 30 minutes 5 days per week or more discussed in detail.  Health counseling performed.  All questions answered.  Lifestyle & Preventative Health Maintenance - Advised patient to continue working toward daily exercising to improve overall mental, physical, and emotional health.    - Healthy dietary habits encouraged, including low-carb, and high amounts of lean protein in diet.   - Patient should also consume adequate amounts of water.   - As part of my medical decision making, I reviewed the following data within the [place] History obtained from pt /family, CMA notes reviewed and incorporated if applicable, Labs reviewed, Radiograph/ tests reviewed if applicable and OV notes from prior OV's with me, as well as other specialists she/he  has seen since seeing me last, were all reviewed and used in my medical decision making process today.   - Additionally, discussion had with patient regarding txmnt plan, their biases about that plan etc were used in my medical decision making today.   - The patient agreed with the plan and demonstrated an understanding of the instructions.   No barriers to understanding were identified.   - Red flag symptoms and signs discussed in detail.  Patient expressed understanding regarding what to do in case of emergency\ urgent symptoms.  The patient was advised to call back or seek an in-person evaluation if the symptoms worsen or if the condition fails to improve as anticipated.   Return for q 108mofor BP, Chol, h/o Pre-DM, wt check.    Meds ordered this encounter  Medications  . hydrochlorothiazide (HYDRODIURIL) 12.5 MG tablet    Sig: Take 1 tablet (12.5 mg total) by mouth daily.    Dispense:  90 tablet    Refill:  1    Medications Discontinued During This Encounter  Medication Reason  . Ascorbic Acid (VITAMIN C GUMMIE PO) Patient Preference  . Vitamins-Lipotropics (BALANCED B-50 COMPLEX PO) Patient Preference  . hydrochlorothiazide (HYDRODIURIL) 12.5 MG tablet Reorder  . 0.9 %  sodium chloride infusion       I provided 18+ minutes of non-face-to-face time during this encounter,with over 50% of the time in direct counseling on patients medical conditions/ medical concerns.  Additional time was spent with charting and coordination of care after the actual visit commenced.   Note:  This note was prepared with assistance of Dragon voice recognition software. Occasional wrong-word or sound-a-like substitutions may have occurred due to the inherent limitations of voice recognition software.  This document serves as a record of services personally performed by DMellody Dance DO. It was created on her behalf by KToni Amend a trained medical scribe. The creation of this record is based on  the scribe's personal observations and the provider's statements to them.   I have reviewed the above medical documentation for accuracy and completeness and I concur.  DMellody Dance DO 01/25/2019 9:18 AM        Patient Care Team    Relationship Specialty Notifications Start End  OMellody Dance DO PCP - General Family Medicine  10/26/16   GRoseanne Kaufman MD Consulting Physician Orthopedic Surgery  10/26/16   LDruscilla Brownie MD Consulting Physician Dermatology  10/26/16   DDrema [name] MD  Physical Medicine and Rehabilitation  10/26/16  Glenna Fellows, MD Attending Physician Neurosurgery  10/26/16   Shela Leff, MD Resident Internal Medicine  10/26/16   Armbruster, Carlota Raspberry, MD Consulting Physician Gastroenterology  02/26/17     -Vitals obtained; medications/ allergies reconciled;  personal medical, social, Sx etc.histories were updated by CMA, reviewed by me and are reflected in chart  Patient Active Problem List   Diagnosis Date Noted  . Stopped smoking with greater than 30 pack year history 02/26/2017    Priority: High  . Gastric erosions--- NSAID-related erosions 10/26/2016    Priority: High  . Pre-diabetes 10/26/2016    Priority: High  . Statin intolerance- elevated LFT's and severe myalgias 10/26/2016    Priority: High  . Dysthymic disorder 10/23/2009    Priority: High  . Mixed hyperlipidemia 09/23/2006    Priority: High  . Essential hypertension 09/23/2006    Priority: High  . Family history of diabetes mellitus (DM)- Mom and sister 10/26/2016    Priority: Medium  . Obesity (BMI 30.0-34.9) 10/26/2016    Priority: Medium  . Fibromyalgia 05/16/2009    Priority: Medium  . HYSTERECTOMY, TOTAL, HX OF 09/23/2006    Priority: Medium  . Nonalcoholic fatty liver disease- elevated LFT's 02/27/2014    Priority: Low  . Gastroesophageal reflux disease without esophagitis 05/08/2013    Priority: Low  . DEGENERATIVE JOINT DISEASE 09/23/2006    Priority:  Low  . Vitamin D deficiency 01/25/2019  . Chronic bilateral low back pain without sciatica 01/25/2019  . Insomnia 09/12/2018  . Caregiver stress 03/28/2018  . Spondylolisthesis, lumbar region 12/15/2017  . Elevated blood sugar level 11/12/2017  . Papanicolaou smear for cervical cancer screening 11/12/2017  . Family history of breast cancer in mother 11/12/2017  . Gastroesophageal reflux disease 09/27/2017  . Obesity, Class I, BMI 30-34.9 09/27/2017  . Elevated hemoglobin ([place]) 01/17/2015  . Health care maintenance 04/26/2014  . Cervicogenic headache 04/25/2012  . Previous back surgery 05/05/2011  . Back pain 05/05/2011     Current Meds  Medication Sig  . Calcium Carbonate-Vitamin D (CALCIUM-VITAMIN D) 600-125 MG-UNIT TABS Take 2 tablets by mouth 2 (two) times daily.   . Cinnamon 500 MG capsule Take 1,000 mg by mouth daily.  . Coenzyme Q10 (COQ-10 PO) Take 1 tablet by mouth daily.  . diphenhydrAMINE (BENADRYL) 25 MG tablet Take 12.5 mg by mouth at bedtime.   . Glucosamine-Chondroit-Vit C-Mn (GLUCOSAMINE 1500 COMPLEX PO) Take 1 tablet by mouth.  . hydrochlorothiazide (HYDRODIURIL) 12.5 MG tablet Take 1 tablet (12.5 mg total) by mouth daily.  . Melatonin 5 MG CAPS Take 1 capsule by mouth at bedtime as needed.   . Multiple Vitamin (MULTIVITAMIN) capsule Take 1 capsule by mouth daily.    . Probiotic Product (PROBIOTIC DAILY PO) Take 1 tablet by mouth daily.  . psyllium (METAMUCIL) 58.6 % powder Take 1 packet by mouth. Take 1 tsp daily  . TURMERIC PO Take 1 tablet by mouth daily.  . [DISCONTINUED] hydrochlorothiazide (HYDRODIURIL) 12.5 MG tablet Take 1 tablet (12.5 mg total) by mouth daily.     Allergies  Allergen Reactions  . Codeine     Nausea and vomiting, hallucinations  . Morphine     B/P dropped!  . Beef-Derived Products Nausea And Vomiting  . Doxycycline Nausea And Vomiting  . Ibuprofen     Caused RLS  . Latex     REACTION: rash  . Nsaids Other (See Comments)    PT  has hx of ulcers.  . Penicillins  hives  . Pork-Derived Products Nausea And Vomiting  . Tramadol     Caused RLS     ROS:  See above HPI for pertinent positives and negatives   Objective:   Blood pressure 108/71, pulse 71, height _0  (1.6 m), weight 173 lb (78.5 kg).  (if some vitals are omitted, this means that patient was UNABLE to obtain them even though they were asked to get them prior to OV today.  They were asked to call us at their earliest convenience with these once obtained.)  General: A & O * 3; visually in no acute distress; in usual state of health.  Skin: Visible skin appears normal and pt's usual skin color HEENT:  EOMI, head is normocephalic and atraumatic.  Sclera are anicteric. Neck has a good range of motion.  Lips are noncyanotic Chest: normal chest excursion and movement Respiratory: speaking in full sentences, no conversational dyspnea; no use of accessory muscles Psych: insight good, mood- appears full

## 2019-01-26 ENCOUNTER — Other Ambulatory Visit: Payer: Self-pay | Admitting: Family Medicine

## 2019-02-03 ENCOUNTER — Other Ambulatory Visit: Payer: Self-pay

## 2019-02-03 ENCOUNTER — Ambulatory Visit
Admission: RE | Admit: 2019-02-03 | Discharge: 2019-02-03 | Disposition: A | Discharge status: Home / Self Care | Payer: Medicare HMO | Source: Ambulatory Visit | Attending: Family Medicine | Admitting: Family Medicine

## 2019-02-03 DIAGNOSIS — Z780 Asymptomatic menopausal state: Secondary | ICD-10-CM | POA: Diagnosis not present

## 2019-02-03 DIAGNOSIS — E2839 Other primary ovarian failure: Secondary | ICD-10-CM

## 2019-02-03 DIAGNOSIS — M85852 Other specified disorders of bone density and structure, left thigh: Secondary | ICD-10-CM | POA: Diagnosis not present

## 2019-03-14 DIAGNOSIS — R69 Illness, unspecified: Secondary | ICD-10-CM | POA: Diagnosis not present

## 2019-05-29 ENCOUNTER — Other Ambulatory Visit: Payer: Self-pay

## 2019-05-29 ENCOUNTER — Encounter: Payer: Self-pay | Admitting: Family Medicine

## 2019-05-29 ENCOUNTER — Ambulatory Visit (INDEPENDENT_AMBULATORY_CARE_PROVIDER_SITE_OTHER): Payer: Medicare HMO | Admitting: Family Medicine

## 2019-05-29 VITALS — BP 97/67 | HR 71 | Wt 174.0 lb

## 2019-05-29 DIAGNOSIS — I10 Essential (primary) hypertension: Secondary | ICD-10-CM

## 2019-05-29 DIAGNOSIS — Z789 Other specified health status: Secondary | ICD-10-CM | POA: Diagnosis not present

## 2019-05-29 DIAGNOSIS — K760 Fatty (change of) liver, not elsewhere classified: Secondary | ICD-10-CM | POA: Diagnosis not present

## 2019-05-29 DIAGNOSIS — E66811 Obesity, class 1: Secondary | ICD-10-CM

## 2019-05-29 DIAGNOSIS — E782 Mixed hyperlipidemia: Secondary | ICD-10-CM | POA: Diagnosis not present

## 2019-05-29 DIAGNOSIS — E559 Vitamin D deficiency, unspecified: Secondary | ICD-10-CM

## 2019-05-29 DIAGNOSIS — R69 Illness, unspecified: Secondary | ICD-10-CM | POA: Diagnosis not present

## 2019-05-29 DIAGNOSIS — R7303 Prediabetes: Secondary | ICD-10-CM

## 2019-05-29 DIAGNOSIS — E669 Obesity, unspecified: Secondary | ICD-10-CM

## 2019-05-29 DIAGNOSIS — F341 Dysthymic disorder: Secondary | ICD-10-CM

## 2019-05-29 NOTE — Progress Notes (Signed)
Telehealth office visit note for Misty Decker, D.O- at Primary Care at [facility]   I connected with current patient today and verified that I am speaking with the correct person using two identifiers.   . Location of the patient: Home . Location of the provider: Office Only the patient (+/- their family members at pt's discretion) and myself were participating in the encounter - This visit type was conducted due to national recommendations for restrictions regarding the COVID-19 Pandemic (e.g. social distancing) in an effort to limit this patient's exposure and mitigate transmission in our community.  This format is felt to be most appropriate for this patient at this time.   - The patient did not have access to video technology or had technical difficulties with video requiring transitioning to audio format only. - No physical exam could be performed with this format, beyond that communicated to [place] by the patient/ family members as noted.   - Additionally my office staff/ schedulers discussed with the patient that there may be a monetary charge related to this service, depending on their medical insurance.   The patient expressed understanding, and agreed to proceed.       History of Present Illness:   I, Toni Amend, am serving as scribe for Dr. Mellody Decker.   Per CMA Towana Badger, patient with no complaints. Feels weight has remained stable. Flu shot added to chart.  Very busy last several week, trying to work on diet and also doing daily walk.   Patient states she is doing well at start of appointment.  Notes feeling busy with the holidays, and that while everything is different with the need to socially distance, she is overall feeling content.  "It seems that there's so much going on, I'm not bored."   - Mood Denies experiencing depression or anxiety.  States "whenever a problem comes up, there seems to be a solution right behind it."   - Vitamin D  Supplementation She continues taking Vitamin D.   - Sleep Difficulty due to History of Lumbar & Cervical Fusion Says she has had three fusions and, with all of her weight loss, she "can feel the fusions, and the cage in the bottom" when she lays down on them (while sleeping at night).  Notes "I can feel the points and stuff [of the hardware]; the whole outline of it."  Says "I can trace it with my fingers."  Says she wishes she knew how to fix this, because she has done all she knows to cushion herself and currently sleeps on memory foam.   - Hypertension: -  Her blood pressure at home has been running:  States usually running around 111-114.  Notes "high for a couple of days" here and there, but feels these are "little hiccups" whenever something upsets her, or if she is dealing with a crisis.  - Patient reports good compliance with medication and/or lifestyle modification  - Her denies acute concerns or problems related to treatment plan  - She denies new onset of: chest pain, exercise intolerance, shortness of breath, dizziness, visual changes, headache, lower extremity swelling or claudication.   Last 3 blood pressure readings in our office are as follows: BP Readings from Last 3 Encounters:  05/29/19 97/67  01/25/19 108/71  12/05/18 110/86   Filed Weights   05/29/19 0838  Weight: 174 lb (78.9 kg)     - Hyperlipidemia:  63 y.o. female here for cholesterol follow-up.   -  Patient reports good compliance with treatment plan of:  medication and/ or lifestyle management.    Notes she checks everything for fat content/always looks at food labels, and states "I hate fatty foods."  Notes she can't eat red meat because of the [place] tick, which she states she's had three times.   - Patient denies any acute concerns or problems with management plan   - She denies new onset of: myalgias, arthralgias, increased fatigue more than normal, chest pains, exercise intolerance, shortness of  breath, dizziness, visual changes, headache, lower extremity swelling or claudication.   Most recent cholesterol panel was:  Lab Results  Component Value Date   CHOL 205 (H) 12/05/2018   HDL 52 12/05/2018   LDLCALC 117 (H) 12/05/2018   TRIG 178 (H) 12/05/2018   CHOLHDL 3.9 12/05/2018   Hepatic Function Latest Ref Rng & Units 12/05/2018 11/12/2017 02/18/2017  Total Protein 6.0 - 8.5 g/dL 7.3 7.1 7.2  Albumin 3.8 - 4.8 g/dL 4.8 4.6 4.4  AST 0 - 40 IU/L 28 26 53(H)  ALT 0 - 32 IU/L 17 18 48(H)  Alk Phosphatase 39 - 117 IU/L 84 90 97  Total Bilirubin 0.0 - 1.2 mg/dL 0.5 0.7 0.6  Bilirubin, Direct 0.00 - 0.40 mg/dL - - -     - Prediabetes:  Notes she avoids simple carbs.  Doesn't eat "white anything."  Last A1C in the office was:  Lab Results  Component Value Date   HGBA1C 5.4 12/05/2018   HGBA1C 5.4 11/12/2017   HGBA1C 5.7 (H) 02/18/2017   Lab Results  Component Value Date   MICROALBUR 0.20 05/13/2007   LDLCALC 117 (H) 12/05/2018   CREATININE 0.73 12/05/2018    GAD 7 : Generalized Anxiety Score 03/28/2018 10/26/2016  Nervous, Anxious, on Edge 0 1  Control/stop worrying 3 1  Worry too much - different things 3 1  Trouble relaxing 0 0  Restless 0 0  Easily annoyed or irritable 0 1  Afraid - awful might happen 0 1  Total GAD 7 Score 6 5  Anxiety Difficulty - Somewhat difficult    Depression screen [place] 2/9 12/05/2018 09/12/2018 03/28/2018 11/12/2017 09/27/2017  Decreased Interest 0 0 0 0 0  Down, Depressed, Hopeless 0 0 0 0 0  PHQ - 2 Score 0 0 0 0 0  Altered sleeping 1 - _0 Tired, decreased energy 0 - 0 0 0  Change in appetite 0 - 0 0 0  Feeling bad or failure about yourself  0 - 0 0 0  Trouble concentrating 0 - 0 0 0  Moving slowly or fidgety/restless 0 - 0 0 0  Suicidal thoughts 0 - 0 0 0  PHQ-9 Score 1 - _1 Difficult doing work/chores Not difficult at all - Not difficult at all Not difficult at all -  Some recent data might be hidden      Impression and  Recommendations:    1. Essential hypertension   2. Mixed hyperlipidemia   3. Statin intolerance- elevated LFT's and severe myalgias   4. Nonalcoholic fatty liver disease- elevated LFT's   5. Pre-diabetes   6. Dysthymic disorder   7. Obesity (BMI 30.0-34.9)   8. Vitamin D deficiency      - Reviewed recent lab work again (December 05, 2018) in depth with patient today.  All lab work within normal limits unless otherwise noted.   Nonalcoholic Fatty Liver Disease - Elevated LFT's - Stable at this time. -  levels significantly improved with wt loss- txmnt is to cont maintaining or losing a little wt to goal bmi less than 30 - LFT's now within normal limits.   - AST = 28, down from 53 two years prior, and 159 three years prior. - ALT = 17, down from 48 two years prior, and 158 three years prior.  - Will continue to monitor.   Essential Hypertension - Stable at this time. - Per patient, BP controlled at home, in low normal range today but pt ASX. - Patient will continue current treatment regimen.  - Counseled patient on pathophysiology of disease and discussed various treatment options, which always includes dietary and lifestyle modification as first line.   - Lifestyle changes such as dash and heart healthy diets and engaging in a regular exercise program discussed extensively with patient.   - Ambulatory blood pressure monitoring encouraged at least 3 times weekly.  Keep log and bring in every office visit.  Reminded patient that if they ever feel poorly in any way, to check their blood pressure and pulse.  - We will continue to monitor   Mixed Hyperlipidemia - Statin Intolerance, Elevated LFT's and Severe Myalgias - Last FLP: - Triglycerides = 178, up from 125 prior. - HDL = 52, up from 50 prior. - LDL = 117, up from 106 prior.  - Pt will continue current treatment regimen- no need for meds   -The 10-year ASCVD risk score Mikey Bussing DC Jr., et al., 2013) is: 3.7%  - Dietary  changes such as low saturated & trans fat diets for hyperlipidemia and low carb diets for hypertriglyceridemia discussed with patient.    - Encouraged patient to follow AHA guidelines for regular exercise and also engage in weight loss if BMI above 25.   We will continue to monitor and re-check as discussed.   Prediabetes - A1c most recently is 5.4, WNL.  - Pt will continue diet and exercise.  - Counseled patient on prevention of diabetes and discussed dietary and lifestyle modification.  - Importance of low carb, heart-healthy diet discussed with patient in addition to regular aerobic exercise of 44mn 5d/week or more.   - We will continue to monitor and re-check as discussed.   BMI Counseling - Obesity, Body mass index is 30.82 kg/m Explained to patient what BMI refers to, and what it means medically.    Told patient to think about it as a "medical risk stratification measurement" and how increasing BMI is associated with increasing risk/ or worsening state of various diseases such as hypertension, hyperlipidemia, diabetes, premature OA, depression etc.  American Heart Association guidelines for healthy diet, basically Mediterranean diet, and exercise guidelines of 30 minutes 5 days per week or more discussed in detail.  - Encouraged patient to continue to lose weight, with goal BMI of under 30.  Health counseling performed.  All questions answered.   Lifestyle & Preventative Health Maintenance - Advised patient to continue working toward exercising to improve overall mental, physical, and emotional health.    - Reviewed the "spokes of the wheel" of mood and health management.  Stressed the importance of ongoing prudent habits, including regular exercise, appropriate sleep hygiene, healthful dietary habits, and prayer/meditation to relax.  - Encouraged patient to engage in daily physical activity, especially a formal exercise routine.  Recommended that the patient eventually  strive for at least 150 minutes of moderate cardiovascular activity per week according to guidelines established by the [facility]   - Healthy dietary habits encouraged,  including low-carb, and high amounts of lean protein in diet.   - Patient should also consume adequate amounts of water.  Recommendations - Return in 6 months for CPE and full fasting lab work.  - As part of my medical decision making, I reviewed the following data within the [place] History obtained from pt /family, CMA notes reviewed and incorporated if applicable, Labs reviewed, Radiograph/ tests reviewed if applicable and OV notes from prior OV's with me, as well as other specialists she/he has seen since seeing me last, were all reviewed and used in my medical decision making process today.    - Additionally, discussion had with patient regarding our treatment plan, and their biases/concerns about that plan were used in my medical decision making today.    - The patient agreed with the plan and demonstrated an understanding of the instructions.   No barriers to understanding were identified.    - Red flag symptoms and signs discussed in detail.  Patient expressed understanding regarding what to do in case of emergency\ urgent symptoms.   - The patient was advised to call back or seek an in-person evaluation if the symptoms worsen or if the condition fails to improve as anticipated.   Return for CPE and full fasting blood work in six months, mid June.    I provided 17.5 minutes of non face-to-face time during this encounter.  Additional time was spent with charting and coordination of care before and after the actual visit commenced.   Note:  This note was prepared with assistance of Dragon voice recognition software. Occasional wrong-word or sound-a-like substitutions may have occurred due to the inherent limitations of voice recognition software.   This document serves as a record of services personally  performed by Misty Dance, DO. It was created on her behalf by Toni Amend, a trained medical scribe. The creation of this record is based on the scribe's personal observations and the provider's statements to them.   This case required medical decision making of at least moderate complexity. The above documentation has been reviewed to be accurate and was completed by Marjory Sneddon, D.O.      Patient Care Team    Relationship Specialty Notifications Start End  Misty Dance, DO PCP - General Family Medicine  10/26/16   Roseanne Kaufman, MD Consulting Physician Orthopedic Surgery  10/26/16   Druscilla Brownie, MD Consulting Physician Dermatology  10/26/16   Drema [name], MD  Physical Medicine and Rehabilitation  10/26/16   Glenna Fellows, MD Attending Physician Neurosurgery  10/26/16   Shela Leff, MD Resident Internal Medicine  10/26/16   Armbruster, Carlota Raspberry, MD Consulting Physician Gastroenterology  02/26/17      -Vitals obtained; medications/ allergies reconciled;  personal medical, social, Sx etc.histories were updated by CMA, reviewed by me and are reflected in chart   Patient Active Problem List   Diagnosis Date Noted  . Stopped smoking with greater than 30 pack year history 02/26/2017  . Gastric erosions--- NSAID-related erosions 10/26/2016  . Pre-diabetes 10/26/2016  . Statin intolerance- elevated LFT's and severe myalgias 10/26/2016  . Dysthymic disorder 10/23/2009  . Mixed hyperlipidemia 09/23/2006  . Essential hypertension 09/23/2006  . Family history of diabetes mellitus (DM)- Mom and sister 10/26/2016  . Obesity (BMI 30.0-34.9) 10/26/2016  . Fibromyalgia 05/16/2009  . HYSTERECTOMY, TOTAL, HX OF 09/23/2006  . Nonalcoholic fatty liver disease- elevated LFT's 02/27/2014  . Gastroesophageal reflux disease without esophagitis 05/08/2013  . DEGENERATIVE JOINT DISEASE 09/23/2006  .  Vitamin D deficiency 01/25/2019  . Chronic bilateral low back pain  without sciatica 01/25/2019  . Insomnia 09/12/2018  . Caregiver stress 03/28/2018  . Spondylolisthesis, lumbar region 12/15/2017  . Elevated blood sugar level 11/12/2017  . Papanicolaou smear for cervical cancer screening 11/12/2017  . Family history of breast cancer in mother 11/12/2017  . Gastroesophageal reflux disease 09/27/2017  . Obesity, Class I, BMI 30-34.9 09/27/2017  . Elevated hemoglobin ([place]) 01/17/2015  . Health care maintenance 04/26/2014  . Cervicogenic headache 04/25/2012  . Previous back surgery 05/05/2011  . Back pain 05/05/2011     Current Meds  Medication Sig  . Calcium Carbonate-Vitamin D (CALCIUM-VITAMIN D) 600-125 MG-UNIT TABS Take 2 tablets by mouth 2 (two) times daily.   . Cinnamon 500 MG capsule Take 1,000 mg by mouth daily.  . Coenzyme Q10 (COQ-10 PO) Take 1 tablet by mouth daily.  . diphenhydrAMINE (BENADRYL) 25 MG tablet Take 12.5 mg by mouth at bedtime.   . Glucosamine-Chondroit-Vit C-Mn (GLUCOSAMINE 1500 COMPLEX PO) Take 1 tablet by mouth.  . hydrochlorothiazide (HYDRODIURIL) 12.5 MG tablet Take 1 tablet (12.5 mg total) by mouth daily.  . Melatonin 5 MG CAPS Take 1 capsule by mouth at bedtime as needed.   . Multiple Vitamin (MULTIVITAMIN) capsule Take 1 capsule by mouth daily.    . Probiotic Product (PROBIOTIC DAILY PO) Take 1 tablet by mouth daily.  . psyllium (METAMUCIL) 58.6 % powder Take 1 packet by mouth. Take 1 tsp daily  . TURMERIC PO Take 1 tablet by mouth daily.     Allergies:  Allergies  Allergen Reactions  . Codeine     Nausea and vomiting, hallucinations  . Morphine     B/P dropped!  . Beef-Derived Products Nausea And Vomiting  . Doxycycline Nausea And Vomiting  . Ibuprofen     Caused RLS  . Latex     REACTION: rash  . Nsaids Other (See Comments)    PT has hx of ulcers.  . Penicillins     hives  . Pork-Derived Products Nausea And Vomiting  . Tramadol     Caused RLS     ROS:  See above HPI for pertinent positives and  negatives   Objective:   Blood pressure 97/67, pulse 71, weight 174 lb (78.9 kg).  (if some vitals are omitted, this means that patient was UNABLE to obtain them even though they were asked to get them prior to OV today.  They were asked to call us at their earliest convenience with these once obtained. )  General: A & O * 3; sounds in no acute distress; in usual state of health.  Skin: Pt confirms warm and dry extremities and pink fingertips HEENT: Pt confirms lips non-cyanotic Chest: Patient confirms normal chest excursion and movement Respiratory: speaking in full sentences, no conversational dyspnea; patient confirms no use of accessory muscles Psych: insight appears good, mood- appears full

## 2019-05-29 NOTE — Progress Notes (Deleted)
No complaints. Feels weight has remained stable. Flu shot added to chart. Very busy last several week, trying to work on diet and also doing daily walk

## 2019-06-29 DIAGNOSIS — Z91040 Latex allergy status: Secondary | ICD-10-CM | POA: Diagnosis not present

## 2019-06-29 DIAGNOSIS — Z87891 Personal history of nicotine dependence: Secondary | ICD-10-CM | POA: Diagnosis not present

## 2019-06-29 DIAGNOSIS — Z008 Encounter for other general examination: Secondary | ICD-10-CM | POA: Diagnosis not present

## 2019-06-29 DIAGNOSIS — Z803 Family history of malignant neoplasm of breast: Secondary | ICD-10-CM | POA: Diagnosis not present

## 2019-06-29 DIAGNOSIS — Z6833 Body mass index (BMI) 33.0-33.9, adult: Secondary | ICD-10-CM | POA: Diagnosis not present

## 2019-06-29 DIAGNOSIS — Z8249 Family history of ischemic heart disease and other diseases of the circulatory system: Secondary | ICD-10-CM | POA: Diagnosis not present

## 2019-06-29 DIAGNOSIS — Z880 Allergy status to penicillin: Secondary | ICD-10-CM | POA: Diagnosis not present

## 2019-06-29 DIAGNOSIS — Z833 Family history of diabetes mellitus: Secondary | ICD-10-CM | POA: Diagnosis not present

## 2019-06-29 DIAGNOSIS — E669 Obesity, unspecified: Secondary | ICD-10-CM | POA: Diagnosis not present

## 2019-06-29 DIAGNOSIS — I10 Essential (primary) hypertension: Secondary | ICD-10-CM | POA: Diagnosis not present

## 2019-06-29 DIAGNOSIS — Z823 Family history of stroke: Secondary | ICD-10-CM | POA: Diagnosis not present

## 2019-07-31 ENCOUNTER — Other Ambulatory Visit: Payer: Self-pay | Admitting: Family Medicine

## 2019-07-31 DIAGNOSIS — I10 Essential (primary) hypertension: Secondary | ICD-10-CM

## 2019-09-01 ENCOUNTER — Other Ambulatory Visit: Payer: Self-pay | Admitting: Family Medicine

## 2019-09-01 DIAGNOSIS — I10 Essential (primary) hypertension: Secondary | ICD-10-CM

## 2019-09-06 ENCOUNTER — Other Ambulatory Visit: Payer: Self-pay | Admitting: Family Medicine

## 2019-09-06 DIAGNOSIS — Z1231 Encounter for screening mammogram for malignant neoplasm of breast: Secondary | ICD-10-CM

## 2019-10-16 DIAGNOSIS — Z0100 Encounter for examination of eyes and vision without abnormal findings: Secondary | ICD-10-CM | POA: Diagnosis not present

## 2019-10-16 DIAGNOSIS — H5213 Myopia, bilateral: Secondary | ICD-10-CM | POA: Diagnosis not present

## 2019-10-18 ENCOUNTER — Ambulatory Visit: Payer: Medicare HMO

## 2019-11-14 ENCOUNTER — Ambulatory Visit
Admission: RE | Admit: 2019-11-14 | Discharge: 2019-11-14 | Disposition: A | Discharge status: Home / Self Care | Payer: Medicare HMO | Source: Ambulatory Visit | Attending: Family Medicine | Admitting: Family Medicine

## 2019-11-14 ENCOUNTER — Other Ambulatory Visit: Payer: Self-pay

## 2019-11-14 DIAGNOSIS — Z1231 Encounter for screening mammogram for malignant neoplasm of breast: Secondary | ICD-10-CM | POA: Diagnosis not present

## 2019-11-16 ENCOUNTER — Other Ambulatory Visit: Payer: Self-pay | Admitting: Physician Assistant

## 2019-11-16 DIAGNOSIS — I10 Essential (primary) hypertension: Secondary | ICD-10-CM

## 2019-11-16 MED ORDER — HYDROCHLOROTHIAZIDE 12.5 MG PO TABS: 12.5000 mg | ORAL_TABLET | Freq: Every day | ORAL | 0 refills | Status: DC

## 2019-11-22 ENCOUNTER — Encounter: Payer: Medicare HMO | Admitting: Family Medicine

## 2019-11-24 ENCOUNTER — Ambulatory Visit (INDEPENDENT_AMBULATORY_CARE_PROVIDER_SITE_OTHER): Payer: Medicare HMO | Admitting: Physician Assistant

## 2019-11-24 ENCOUNTER — Other Ambulatory Visit: Payer: Self-pay

## 2019-11-24 ENCOUNTER — Encounter: Payer: Self-pay | Admitting: Physician Assistant

## 2019-11-24 VITALS — BP 136/67 | HR 71 | Temp 97.8°F | Ht 62.5 in | Wt 186.5 lb

## 2019-11-24 DIAGNOSIS — E782 Mixed hyperlipidemia: Secondary | ICD-10-CM | POA: Diagnosis not present

## 2019-11-24 DIAGNOSIS — H6123 Impacted cerumen, bilateral: Secondary | ICD-10-CM | POA: Diagnosis not present

## 2019-11-24 DIAGNOSIS — Z833 Family history of diabetes mellitus: Secondary | ICD-10-CM

## 2019-11-24 DIAGNOSIS — Z0000 Encounter for general adult medical examination without abnormal findings: Secondary | ICD-10-CM

## 2019-11-24 DIAGNOSIS — D582 Other hemoglobinopathies: Secondary | ICD-10-CM | POA: Diagnosis not present

## 2019-11-24 DIAGNOSIS — I10 Essential (primary) hypertension: Secondary | ICD-10-CM

## 2019-11-24 DIAGNOSIS — Z789 Other specified health status: Secondary | ICD-10-CM | POA: Diagnosis not present

## 2019-11-24 DIAGNOSIS — E559 Vitamin D deficiency, unspecified: Secondary | ICD-10-CM | POA: Diagnosis not present

## 2019-11-24 DIAGNOSIS — R7303 Prediabetes: Secondary | ICD-10-CM

## 2019-11-24 DIAGNOSIS — F341 Dysthymic disorder: Secondary | ICD-10-CM

## 2019-11-24 DIAGNOSIS — R69 Illness, unspecified: Secondary | ICD-10-CM | POA: Diagnosis not present

## 2019-11-24 NOTE — Progress Notes (Signed)
Female Physical   Impression and Recommendations:    1. Health care maintenance   2. Mixed hyperlipidemia   3. Essential hypertension   4. Family history of diabetes mellitus (DM)- Mom and sister   76. Pre-diabetes   6. Statin intolerance- elevated LFT's and severe myalgias   7. Elevated hemoglobin (HCC)   8. Vitamin D deficiency   9. Dysthymic disorder   10. Bilateral impacted cerumen      1) Anticipatory Guidance: Discussed skin CA prevention and sunscreen when outside along with skin surveillance; eating a balanced and modest diet; physical activity at least 25 minutes per day or minimum of 150 min/ week moderate to intense activity.  2) Immunizations / Screenings / Labs:   All immunizations are up-to-date per recommendations or will be updated today if pt allows.    - Patient understands with dental and vision screens they will schedule independently.  - Will obtain CBC, CMP, HgA1c, Lipid panel, TSH and vit D when fasting, if not already done past 12 mo/ recently  - UTD on mammogram, colonoscopy, Tdap, Dexa scan, Hep C and HIV screening - Declined Shingrix vaccine due to cost.    3) Weight:  BMI meaning discussed with patient.  Discussed goal to improve diet habits to improve overall feelings of well being and objective health data. Improve nutrient density of diet through increasing intake of fruits and vegetables and decreasing saturated fats, white flour products and refined sugars.  Healthcare Maintenance:  - Continue current medication regimen. - Follow heart healthy diet. - Stay as active as possible. - Stay well hydrated, at least 64 fl oz - Follow-up in 6 months for OV- HTN, HLD, Pre-DM, or sooner if needed  No orders of the defined types were placed in this encounter.   Orders Placed This Encounter  Procedures   CBC with Differential/Platelet   Comprehensive metabolic panel   Hemoglobin A1c   Lipid panel   TSH   VITAMIN D 25 Hydroxy (Vit-D  Deficiency, Fractures)     Return in about 6 months (around 05/25/2020) for HTN, HLD, Pre-DM.     Gross side effects, risk and benefits, and alternatives of medications discussed with patient.  Patient is aware that all medications have potential side effects and we are unable to predict every side effect or drug-drug interaction that may occur.  Expresses verbal understanding and consents to current therapy plan and treatment regimen.  F-up preventative CPE in 1 year, this is in addition to any chronic care visits.    Please see orders placed and AVS handed out to patient at the end of our visit for further patient instructions/ counseling done pertaining to today's office visit.     Subjective:     CPE HPI: Misty Decker is a 64 y.o. female who presents to [facility] today for a yearly health maintenance exam.   Health Maintenance Summary  - Reviewed and updated, unless pt declines services.  Last Cologuard or Colonoscopy:  10/20/2017, repeat in 5 yrs Family history of Colon CA: N Tobacco History Reviewed:  Y, former smoker CT scan for screening lung CA: N/A Alcohol and/or drug use:   No concerns; no use Dental Home: N Eye exams: Y Dermatology home: N  Female Health:  PAP Smear - last known results:  06/07/219- negative; hx of hysterectomy  STD concerns:  none Lumps or breast concerns:  none Breast Cancer Family History:  Y Bone/ DEXA scan:  N/A  Additional concerns beyond health maintenance issues:  none   Immunization History  Administered Date(s) Administered   Hepatitis A, Adult 10/04/2014, 11/02/2014   Hepatitis B, ped/adol 10/04/2014, 11/02/2014, 04/04/2015   Influenza Split 02/27/2011, 03/22/2012   Influenza Whole 02/23/2008, 05/16/2009, 02/19/2010   Influenza,inj,Quad PF,6+ Mos 03/20/2013, 04/26/2014, 04/04/2015, 03/10/2016, 03/23/2017, 03/28/2018   Influenza-Unspecified 03/14/2019   Pneumococcal  Polysaccharide-23 01/21/2016   Tdap 05/05/2011     Health Maintenance  Topic Date Due   COVID-19 Vaccine (1) Never done   INFLUENZA VACCINE  01/07/2020   PAP SMEAR-Modifier  11/12/2020   TETANUS/TDAP  05/04/2021   MAMMOGRAM  11/13/2021   COLONOSCOPY  10/21/2022   Hepatitis C Screening  Completed   HIV Screening  Completed     Wt Readings from Last 3 Encounters:  11/24/19 186 lb 8 oz (84.6 kg)  05/29/19 174 lb (78.9 kg)  01/25/19 173 lb (78.5 kg)   BP Readings from Last 3 Encounters:  11/24/19 136/67  05/29/19 97/67  01/25/19 108/71   Pulse Readings from Last 3 Encounters:  11/24/19 71  05/29/19 71  01/25/19 71     Past Medical History:  Diagnosis Date   Complication of anesthesia    "Hard to sedate", per pt   Degenerative joint disease    s/p cervical and lumbar fusions   Depression    Encounter for preventive health examination    Next mammogram in 07/2009. Next colonoscopy in 09/2017. DEXA 4/09>Normal   Encounter for screening colonoscopy 10/03/2007   Normal   Fibromyalgia    Hemorrhoid    hx of   Hyperlipidemia    Hypertension    Sciatica       Past Surgical History:  Procedure Laterality Date   ABDOMINAL HYSTERECTOMY N/A    [facility] 11/21/2019   APPENDECTOMY N/A    [facility] 11/21/2019   CERVICAL FUSION  2010   X 2   LUMBAR FUSION  1997   X 2/has plate and screws and cage   OVARIAN CYST SURGERY  1980   pelvic prolapse  1987   RECTAL PROLAPSE REPAIR     SPINE SURGERY N/A    [facility] 11/21/2019   TONSILLECTOMY     and adenoids   TOTAL ABDOMINAL HYSTERECTOMY  1987      Family History  Problem Relation Age of Onset   Cancer Mother    Diabetes Mother    Breast cancer Mother    Heart attack Mother    Hyperlipidemia Mother    Hyperlipidemia Father    Hypertension Father    Aneurysm Father    Dementia Maternal Grandfather    Cancer Paternal Grandmother        intestinal      Social History    Substance and Sexual Activity  Drug Use No  ,   Social History   Substance and Sexual Activity  Alcohol Use Not Currently   Alcohol/week: 0.0 standard drinks  ,   Social History   Tobacco Use  Smoking Status Former Smoker   Packs/day: 2.00   Years: 30.00   Pack years: 60.00   Types: Cigarettes   Quit date: 04/03/2003   Years since quitting: 16.6  Smokeless Tobacco Never Used  ,   Social History   Substance and Sexual Activity  Sexual Activity Not Currently    Current Outpatient Medications on File Prior to Visit  Medication Sig Dispense Refill   Calcium Carbonate-Vitamin D (CALCIUM-VITAMIN D) 600-125 MG-UNIT TABS Take 2 tablets by mouth 2 (two) times  daily.      cetirizine (ZYRTEC) 10 MG chewable tablet Chew 10 mg by mouth daily.     Cinnamon 500 MG capsule Take 1,000 mg by mouth daily.     Coenzyme Q10 (COQ-10 PO) Take 1 tablet by mouth daily.     Glucosamine-Chondroit-Vit C-Mn (GLUCOSAMINE 1500 COMPLEX PO) Take 1 tablet by mouth.     hydrochlorothiazide (HYDRODIURIL) 12.5 MG tablet Take 1 tablet (12.5 mg total) by mouth daily. **PATIENT NEEDS APT FOR FURTHER REFILLS** 60 tablet 0   Multiple Vitamin (MULTIVITAMIN) capsule Take 1 capsule by mouth daily.       Probiotic Product (PROBIOTIC DAILY PO) Take 1 tablet by mouth daily.     psyllium (METAMUCIL) 58.6 % powder Take 1 packet by mouth. Take 1 tsp daily     TURMERIC PO Take 1 tablet by mouth daily.     UNABLE TO FIND Take 900 mg by mouth. Med Name: Agal     No current facility-administered medications on file prior to visit.    Allergies: Codeine, Morphine, Morphine and related, Other, Beef-derived products, Doxycycline, Ibuprofen, Iodinated diagnostic agents, Latex, Nsaids, Penicillins, Pork-derived products, and Tramadol  Review of Systems: General:   Denies fever, chills, unexplained weight loss.  Optho/Auditory:   Denies visual changes, blurred vision/LOV Respiratory:   Denies SOB, DOE  more than baseline levels.   Cardiovascular:   Denies chest pain, palpitations, new onset peripheral edema  Gastrointestinal:   Denies nausea, vomiting, diarrhea.  Genitourinary: Denies dysuria, freq/ urgency, flank pain or discharge from genitals.  Endocrine:     Denies hot or cold intolerance, polyuria, polydipsia. Musculoskeletal:   Denies unexplained myalgias, joint swelling, unexplained arthralgias, gait problems.  Skin:  Denies rash, suspicious lesions Neurological:     Denies dizziness, unexplained weakness, numbness  Psychiatric/Behavioral:   Denies suicidal or homicidal ideations, hallucinations    Objective:    Blood pressure 136/67, pulse 71, temperature 97.8 F (36.6 C), temperature source Oral, height 5' 2.5" (1.588 m), weight 186 lb 8 oz (84.6 kg), SpO2 98 %. Body mass index is 33.57 kg/m. General Appearance:    Alert, cooperative, no distress, appears stated age  Head:    Normocephalic, without obvious abnormality, atraumatic  Eyes:    PERRL, conjunctiva/corneas clear, EOM's intact, fundi    benign, both eyes  Ears:    Mild cerumen impaction of both ears  Nose:   Nares normal, septum midline, mucosa normal, no drainage    or sinus tenderness  Throat:   Lips w/o lesion, mucosa moist, and tongue normal; teeth and   gums normal  Neck:   Supple, symmetrical, trachea midline, no adenopathy;    thyroid:  no enlargement/tenderness/nodules; no carotid   bruit or JVD. Posterior mid-cervical scar noted.  Back:     Symmetric, no curvature, ROM normal, no CVA tenderness  Lungs:     Clear to auscultation bilaterally, respirations unlabored, no     Wh/ R/ R  Chest Wall:    No tenderness or gross deformity; normal excursion   Heart:    Regular rate and rhythm, S1 and S2 normal, no murmur, rub   or gallop  Breast Exam:    Deferred. Recent mammogram done  Abdomen:     Soft, non-tender, bowel sounds active all four quadrants, No   G/R/R, no masses, no organomegaly  Genitalia:     Deferred.  Rectal:    Deferred.  Extremities:   Extremities normal, atraumatic, no cyanosis or gross edema  Pulses:  2+ and symmetric all extremities  Skin:   Warm, dry, Skin color, texture, turgor normal, no obvious rashes or lesions Psych: No HI/SI, judgement and insight good, Euthymic mood. Full Affect.  Neurologic:   CNII-XII intact, normal strength, sensation and reflexes    Throughout

## 2019-11-24 NOTE — Patient Instructions (Signed)

## 2019-11-25 LAB — COMPREHENSIVE METABOLIC PANEL
ALT: 17 IU/L (ref 0–32)
AST: 31 IU/L (ref 0–40)
Albumin/Globulin Ratio: 1.8 (ref 1.2–2.2)
Albumin: 4.7 g/dL (ref 3.8–4.8)
Alkaline Phosphatase: 71 IU/L (ref 48–121)
BUN/Creatinine Ratio: 17 (ref 12–28)
BUN: 16 mg/dL (ref 8–27)
Bilirubin Total: 0.6 mg/dL (ref 0.0–1.2)
CO2: 21 mmol/L (ref 20–29)
Calcium: 9.8 mg/dL (ref 8.7–10.3)
Chloride: 102 mmol/L (ref 96–106)
Creatinine, Ser: 0.96 mg/dL (ref 0.57–1.00)
GFR calc Af Amer: 73 mL/min/{1.73_m2} (ref >59)
GFR calc non Af Amer: 63 mL/min/{1.73_m2} (ref >59)
Globulin, Total: 2.6 g/dL (ref 1.5–4.5)
Glucose: 117 mg/dL — ABNORMAL HIGH (ref 65–99)
Potassium: 4.3 mmol/L (ref 3.5–5.2)
Sodium: 141 mmol/L (ref 134–144)
Total Protein: 7.3 g/dL (ref 6.0–8.5)

## 2019-11-25 LAB — CBC WITH DIFFERENTIAL/PLATELET
Basophils Absolute: 0.1 10*3/uL (ref 0.0–0.2)
Basos: 1 %
EOS (ABSOLUTE): 0.3 10*3/uL (ref 0.0–0.4)
Eos: 4 %
Hematocrit: 43.8 % (ref 34.0–46.6)
Hemoglobin: 15.7 g/dL (ref 11.1–15.9)
Immature Grans (Abs): 0 10*3/uL (ref 0.0–0.1)
Immature Granulocytes: 0 %
Lymphocytes Absolute: 2.3 10*3/uL (ref 0.7–3.1)
Lymphs: 34 %
MCH: 32.6 pg (ref 26.6–33.0)
MCHC: 35.8 g/dL — ABNORMAL HIGH (ref 31.5–35.7)
MCV: 91 fL (ref 79–97)
Monocytes Absolute: 0.4 10*3/uL (ref 0.1–0.9)
Monocytes: 6 %
Neutrophils Absolute: 3.6 10*3/uL (ref 1.4–7.0)
Neutrophils: 55 %
Platelets: 240 10*3/uL (ref 150–450)
RBC: 4.81 x10E6/uL (ref 3.77–5.28)
RDW: 12.3 % (ref 11.7–15.4)
WBC: 6.8 10*3/uL (ref 3.4–10.8)

## 2019-11-25 LAB — LIPID PANEL
Chol/HDL Ratio: 4.5 ratio — ABNORMAL HIGH (ref 0.0–4.4)
Cholesterol, Total: 243 mg/dL — ABNORMAL HIGH (ref 100–199)
HDL: 54 mg/dL (ref >39)
LDL Chol Calc (NIH): 151 mg/dL — ABNORMAL HIGH (ref 0–99)
Triglycerides: 210 mg/dL — ABNORMAL HIGH (ref 0–149)
VLDL Cholesterol Cal: 38 mg/dL (ref 5–40)

## 2019-11-25 LAB — HEMOGLOBIN A1C
Est. average glucose Bld gHb Est-mCnc: 105 mg/dL
Hgb A1c MFr Bld: 5.3 % (ref 4.8–5.6)

## 2019-11-25 LAB — TSH: TSH: 3.47 u[IU]/mL (ref 0.450–4.500)

## 2019-11-25 LAB — VITAMIN D 25 HYDROXY (VIT D DEFICIENCY, FRACTURES): Vit D, 25-Hydroxy: 45.2 ng/mL (ref 30.0–100.0)

## 2020-01-29 ENCOUNTER — Telehealth: Payer: Self-pay | Admitting: Physician Assistant

## 2020-01-29 ENCOUNTER — Other Ambulatory Visit: Payer: Self-pay | Admitting: Physician Assistant

## 2020-01-29 DIAGNOSIS — I10 Essential (primary) hypertension: Secondary | ICD-10-CM

## 2020-01-29 MED ORDER — HYDROCHLOROTHIAZIDE 12.5 MG PO TABS: 12.5000 mg | ORAL_TABLET | Freq: Every day | ORAL | 0 refills | Status: DC

## 2020-01-29 NOTE — Telephone Encounter (Signed)
Medication sent to requested pharmacy. AS, CMA

## 2020-01-29 NOTE — Telephone Encounter (Signed)
Patient called and left VM asking for refill of BP meds to be sent to [place], if approved please place order.

## 2020-01-29 NOTE — Addendum Note (Signed)
Addended by: Mickel Crow on: 01/29/2020 02:47 PM   Modules accepted: Orders

## 2020-04-10 DIAGNOSIS — R69 Illness, unspecified: Secondary | ICD-10-CM | POA: Diagnosis not present

## 2020-04-26 ENCOUNTER — Other Ambulatory Visit: Payer: Self-pay | Admitting: Physician Assistant

## 2020-04-26 DIAGNOSIS — I10 Essential (primary) hypertension: Secondary | ICD-10-CM

## 2020-05-23 ENCOUNTER — Other Ambulatory Visit: Payer: Self-pay

## 2020-05-23 ENCOUNTER — Encounter: Payer: Self-pay | Admitting: Physician Assistant

## 2020-05-23 ENCOUNTER — Ambulatory Visit (INDEPENDENT_AMBULATORY_CARE_PROVIDER_SITE_OTHER): Payer: Medicare HMO | Admitting: Physician Assistant

## 2020-05-23 VITALS — BP 140/72 | HR 80 | Ht 62.5 in | Wt 184.8 lb

## 2020-05-23 DIAGNOSIS — I10 Essential (primary) hypertension: Secondary | ICD-10-CM

## 2020-05-23 DIAGNOSIS — Z6833 Body mass index (BMI) 33.0-33.9, adult: Secondary | ICD-10-CM

## 2020-05-23 DIAGNOSIS — E669 Obesity, unspecified: Secondary | ICD-10-CM

## 2020-05-23 DIAGNOSIS — E782 Mixed hyperlipidemia: Secondary | ICD-10-CM

## 2020-05-23 DIAGNOSIS — R7303 Prediabetes: Secondary | ICD-10-CM

## 2020-05-23 NOTE — Progress Notes (Signed)
Established Patient Office Visit  Subjective:  Patient ID: Misty Decker, female    DOB: 1956-03-29  Age: 64 y.o. MRN: [id number]  CC:  Chief Complaint  Patient presents with  . Hyperlipidemia  . Hypertension  . Prediabetes    HPI Misty Decker presents for follow up on hypertension, hyperlipidemia and prediabetes.  HTN: Pt denies chest pain, palpitations, headache, or lower extremity swelling. States yesterday was experiencing dizziness related to a low blood pressure reading of 89/79. States stays hydrated and unsure why she had a low reading. On average BP readings range 114-121/80s. Taking medication as directed without side effects.   HLD: Pt is trying to manage with diet. Reports is not able to tolerate statin therapy due to abdominal adhesions. Has started walking again.  Prediabetes: Asymptomatic.   Past Medical History:  Diagnosis Date  . Complication of anesthesia    "Hard to sedate", per pt  . Degenerative joint disease    s/p cervical and lumbar fusions  . Depression   . Encounter for preventive health examination    Next mammogram in 07/2009. Next colonoscopy in 09/2017. DEXA 4/09>Normal  . Encounter for screening colonoscopy 10/03/2007   Normal  . Fibromyalgia   . Hemorrhoid    hx of  . Hyperlipidemia   . Hypertension   . Sciatica     Past Surgical History:  Procedure Laterality Date  . ABDOMINAL HYSTERECTOMY N/A    [facility] 11/21/2019  . APPENDECTOMY N/A    [facility] 11/21/2019  . CERVICAL FUSION  2010   X 2  . LUMBAR FUSION  1997   X 2/has plate and screws and cage  . OVARIAN CYST SURGERY  1980  . pelvic prolapse  1987  . RECTAL PROLAPSE REPAIR    . SPINE SURGERY N/A    [facility] 11/21/2019  . TONSILLECTOMY     and adenoids  . TOTAL ABDOMINAL HYSTERECTOMY  1987    Family History  Problem Relation Age of Onset  . Cancer Mother   . Diabetes Mother   . Breast cancer Mother   . Heart attack Mother   . Hyperlipidemia Mother   .  Hyperlipidemia Father   . Hypertension Father   . Aneurysm Father   . Dementia Maternal Grandfather   . Cancer Paternal Grandmother        intestinal    Social History   Socioeconomic History  . Marital status: Married    Spouse name: Not on file  . Number of children: Not on file  . Years of education: Not on file  . Highest education level: Not on file  Occupational History  . Not on file  Tobacco Use  . Smoking status: Former Smoker    Packs/day: 2.00    Years: 30.00    Pack years: 60.00    Types: Cigarettes    Quit date: 04/03/2003    Years since quitting: 17.1  . Smokeless tobacco: Never Used  Vaping Use  . Vaping Use: Never used  Substance and Sexual Activity  . Alcohol use: Not Currently    Alcohol/week: 0.0 standard drinks  . Drug use: No  . Sexual activity: Not Currently  Other Topics Concern  . Not on file  Social History Narrative  . Not on file   Social Determinants of Health   Financial Resource Strain: Not on file  Food Insecurity: Not on file  Transportation Needs: Not on file  Physical Activity: Not on file  Stress: Not on file  Social Connections: Not on file  Intimate Partner Violence: Not on file    Outpatient Medications Prior to Visit  Medication Sig Dispense Refill  . Calcium Carbonate-Vitamin D (CALCIUM-VITAMIN D) 600-125 MG-UNIT TABS Take 2 tablets by mouth 2 (two) times daily.    . cetirizine (ZYRTEC) 10 MG chewable tablet Chew 10 mg by mouth daily.    . Cinnamon 500 MG capsule Take 1,000 mg by mouth daily.    . Glucosamine-Chondroit-Vit C-Mn (GLUCOSAMINE 1500 COMPLEX PO) Take 1 tablet by mouth.    . hydrochlorothiazide (HYDRODIURIL) 12.5 MG tablet TAKE 1 TABLET (12.5 MG TOTAL) BY MOUTH DAILY. 90 tablet 0  . Multiple Vitamin (MULTIVITAMIN) capsule Take 1 capsule by mouth daily.    . Probiotic Product (PROBIOTIC DAILY PO) Take 1 tablet by mouth daily.    . psyllium (METAMUCIL) 58.6 % powder Take 1 packet by mouth. Take 1 tsp daily     . TURMERIC PO Take 1 tablet by mouth daily.    Marland Kitchen UNABLE TO FIND Take 900 mg by mouth. Med Name: Agal    . Coenzyme Q10 (COQ-10 PO) Take 1 tablet by mouth daily.     No facility-administered medications prior to visit.    Allergies  Allergen Reactions  . Codeine     Nausea and vomiting, hallucinations  . Morphine     B/P dropped!  Marland Kitchen Morphine And Related Shortness Of Breath  . Other Itching and Diarrhea  . Beef-Derived Products Nausea And Vomiting  . Doxycycline Nausea And Vomiting  . Ibuprofen     Caused RLS  . Iodinated Diagnostic Agents   . Latex     REACTION: rash  . Nsaids Other (See Comments)    PT has hx of ulcers.  . Penicillins     hives  . Pork-Derived Products Nausea And Vomiting  . Tramadol     Caused RLS    ROS Review of Systems A fourteen system review of systems was performed and found to be positive as per HPI.   Objective:    Physical Exam General:  Well Developed, well nourished, in no acute distress Neuro:  Alert and oriented,  extra-ocular muscles intact  HEENT:  Normocephalic, atraumatic, neck supple Skin:  no gross rash, warm, pink. Cardiac:  RRR, S1 S2 Respiratory:  ECTA B/L, Not using accessory muscles, speaking in full sentences- unlabored. Vascular:  Ext warm, no cyanosis apprec.; no gross edema Psych:  No HI/SI, judgement and insight good, Euthymic mood. Full Affect.  BP 140/72   Pulse 80   Ht 5' 2.5" (1.588 m)   Wt 184 lb 12.8 oz (83.8 kg)   SpO2 98%   BMI 33.26 kg/m  Wt Readings from Last 3 Encounters:  05/23/20 184 lb 12.8 oz (83.8 kg)  11/24/19 186 lb 8 oz (84.6 kg)  05/29/19 174 lb (78.9 kg)     Health Maintenance Due  Topic Date Due  . COVID-19 Vaccine (1) Never done  . INFLUENZA VACCINE  01/07/2020    There are no preventive care reminders to display for this patient.  Lab Results  Component Value Date   TSH 3.470 11/24/2019   Lab Results  Component Value Date   WBC 6.8 11/24/2019   HGB 15.7 11/24/2019    HCT 43.8 11/24/2019   MCV 91 11/24/2019   PLT 240 11/24/2019   Lab Results  Component Value Date   NA 141 11/24/2019   K 4.3 11/24/2019   CO2 21 11/24/2019   GLUCOSE 117 (H)  11/24/2019   BUN 16 11/24/2019   CREATININE 0.96 11/24/2019   BILITOT 0.6 11/24/2019   ALKPHOS 71 11/24/2019   AST 31 11/24/2019   ALT 17 11/24/2019   PROT 7.3 11/24/2019   ALBUMIN 4.7 11/24/2019   CALCIUM 9.8 11/24/2019   ANIONGAP 12 10/07/2015   Lab Results  Component Value Date   CHOL 243 (H) 11/24/2019   Lab Results  Component Value Date   HDL 54 11/24/2019   Lab Results  Component Value Date   LDLCALC 151 (H) 11/24/2019   Lab Results  Component Value Date   TRIG 210 (H) 11/24/2019   Lab Results  Component Value Date   CHOLHDL 4.5 (H) 11/24/2019   Lab Results  Component Value Date   HGBA1C 5.3 11/24/2019      Assessment & Plan:   Problem List Items Addressed This Visit      Cardiovascular and Mediastinum   Essential hypertension - Primary (Chronic)   Relevant Orders   Comp Met (CMET)     Other   Mixed hyperlipidemia (Chronic)   Pre-diabetes (Chronic)    Other Visit Diagnoses    Class 1 obesity with serious comorbidity and body mass index (BMI) of 33.0 to 33.9 in adult, unspecified obesity type         Hypertension: -BP fairly controlled.  If experiences recurrent hypotensive readings recommend further evaluation and medication adjustments. -Continue ambulatory BP/pulse monitoring. -Continue current medication regimen. -Continue to stay well-hydrated and monitor sodium. -Collecting CMP for medication monitoring.  Mixed hyperlipidemia: -Last lipid panel: Total cholesterol 243, triglycerides 210, HDL 54, LDL 151 -Patient unable to tolerate statins and is concerned about cost as well.  Will contact insurance regarding medication cost for Zetia. -Recommend to continue with dietary changes and follow a heart healthy diet. -Continue to stay active with walking. -Will  continue to monitor.  Class I obesity with serious comorbidity and body mass index (BMI) of 33.0-33.9 in adult, unspecified obesity type: -Patient has lost 2 pounds since last visit. -Encourage to continue with weight loss efforts, walking regimen and dietary changes.  Prediabetes: -Last A1c 5.3, remains well controlled with diet. -Will continue to monitor.    No orders of the defined types were placed in this encounter.   Follow-up: Return in about 6 months (around 11/21/2020) for CPE and FBW.    Lorrene Reid, PA-C

## 2020-05-23 NOTE — Patient Instructions (Addendum)
Zetia- cholesterol medication (non-statin medication) Shingrix vaccine   Heart-Healthy Eating Plan Many factors influence your heart (coronary) health, including eating and exercise habits. Coronary risk increases with abnormal blood fat (lipid) levels. Heart-healthy meal planning includes limiting unhealthy fats, increasing healthy fats, and making other diet and lifestyle changes. What is my plan? Your health care provider may recommend that you:  Limit your fat intake to _________% or less of your total calories each day.  Limit your saturated fat intake to _________% or less of your total calories each day.  Limit the amount of cholesterol in your diet to less than _________ mg per day. What are tips for following this plan? Cooking Cook foods using methods other than frying. Baking, boiling, grilling, and broiling are all good options. Other ways to reduce fat include:  Removing the skin from poultry.  Removing all visible fats from meats.  Steaming vegetables in water or broth. Meal planning   At meals, imagine dividing your plate into fourths: ? Fill one-half of your plate with vegetables and green salads. ? Fill one-fourth of your plate with whole grains. ? Fill one-fourth of your plate with lean protein foods.  Eat 4-5 servings of vegetables per day. One serving equals 1 cup raw or cooked vegetable, or 2 cups raw leafy greens.  Eat 4-5 servings of fruit per day. One serving equals 1 medium whole fruit,  cup dried fruit,  cup fresh, frozen, or canned fruit, or  cup 100% fruit juice.  Eat more foods that contain soluble fiber. Examples include apples, broccoli, carrots, beans, peas, and barley. Aim to get 25-30 g of fiber per day.  Increase your consumption of legumes, nuts, and seeds to 4-5 servings per week. One serving of dried beans or legumes equals  cup cooked, 1 serving of nuts is  cup, and 1 serving of seeds equals 1 tablespoon. Fats  Choose healthy fats  more often. Choose monounsaturated and polyunsaturated fats, such as olive and canola oils, flaxseeds, walnuts, almonds, and seeds.  Eat more omega-3 fats. Choose salmon, mackerel, sardines, tuna, flaxseed oil, and ground flaxseeds. Aim to eat fish at least 2 times each week.  Check food labels carefully to identify foods with trans fats or high amounts of saturated fat.  Limit saturated fats. These are found in animal products, such as meats, butter, and cream. Plant sources of saturated fats include palm oil, palm kernel oil, and coconut oil.  Avoid foods with partially hydrogenated oils in them. These contain trans fats. Examples are stick margarine, some tub margarines, cookies, crackers, and other baked goods.  Avoid fried foods. General information  Eat more home-cooked food and less restaurant, buffet, and fast food.  Limit or avoid alcohol.  Limit foods that are high in starch and sugar.  Lose weight if you are overweight. Losing just 5-10% of your body weight can help your overall health and prevent diseases such as diabetes and heart disease.  Monitor your salt (sodium) intake, especially if you have high blood pressure. Talk with your health care provider about your sodium intake.  Try to incorporate more vegetarian meals weekly. What foods can I eat? Fruits All fresh, canned (in natural juice), or frozen fruits. Vegetables Fresh or frozen vegetables (raw, steamed, roasted, or grilled). Green salads. Grains Most grains. Choose whole wheat and whole grains most of the time. Rice and pasta, including brown rice and pastas made with whole wheat. Meats and other proteins Lean, well-trimmed beef, veal, pork, and lamb. Chicken  and [place] without skin. All fish and shellfish. Wild duck, rabbit, pheasant, and venison. Egg whites or low-cholesterol egg substitutes. Dried beans, peas, lentils, and tofu. Seeds and most nuts. Dairy Low-fat or nonfat cheeses, including ricotta and  mozzarella. Skim or 1% milk (liquid, powdered, or evaporated). Buttermilk made with low-fat milk. Nonfat or low-fat yogurt. Fats and oils Non-hydrogenated (trans-free) margarines. Vegetable oils, including soybean, sesame, sunflower, olive, peanut, safflower, corn, canola, and cottonseed. Salad dressings or mayonnaise made with a vegetable oil. Beverages Water (mineral or sparkling). Coffee and tea. Diet carbonated beverages. Sweets and desserts Sherbet, gelatin, and fruit ice. Small amounts of dark chocolate. Limit all sweets and desserts. Seasonings and condiments All seasonings and condiments. The items listed above may not be a complete list of foods and beverages you can eat. Contact a dietitian for more options. What foods are not recommended? Fruits Canned fruit in heavy syrup. Fruit in cream or butter sauce. Fried fruit. Limit coconut. Vegetables Vegetables cooked in cheese, cream, or butter sauce. Fried vegetables. Grains Breads made with saturated or trans fats, oils, or whole milk. Croissants. Sweet rolls. Donuts. High-fat crackers, such as cheese crackers. Meats and other proteins Fatty meats, such as hot dogs, ribs, sausage, bacon, rib-eye roast or steak. High-fat deli meats, such as salami and bologna. Caviar. Domestic duck and goose. Organ meats, such as liver. Dairy Cream, sour cream, cream cheese, and creamed cottage cheese. Whole milk cheeses. Whole or 2% milk (liquid, evaporated, or condensed). Whole buttermilk. Cream sauce or high-fat cheese sauce. Whole-milk yogurt. Fats and oils Meat fat, or shortening. Cocoa butter, hydrogenated oils, palm oil, coconut oil, palm kernel oil. Solid fats and shortenings, including bacon fat, salt pork, lard, and butter. Nondairy cream substitutes. Salad dressings with cheese or sour cream. Beverages Regular sodas and any drinks with added sugar. Sweets and desserts Frosting. Pudding. Cookies. Cakes. Pies. Milk chocolate or white  chocolate. Buttered syrups. Full-fat ice cream or ice cream drinks. The items listed above may not be a complete list of foods and beverages to avoid. Contact a dietitian for more information. Summary  Heart-healthy meal planning includes limiting unhealthy fats, increasing healthy fats, and making other diet and lifestyle changes.  Lose weight if you are overweight. Losing just 5-10% of your body weight can help your overall health and prevent diseases such as diabetes and heart disease.  Focus on eating a balance of foods, including fruits and vegetables, low-fat or nonfat dairy, lean protein, nuts and legumes, whole grains, and heart-healthy oils and fats. This information is not intended to replace advice given to you by your health care provider. Make sure you discuss any questions you have with your health care provider. Document Revised: 07/02/2017 Document Reviewed: 07/02/2017 Elsevier Patient Education  2020 [employer].

## 2020-05-24 LAB — COMPREHENSIVE METABOLIC PANEL
ALT: 16 IU/L (ref 0–32)
AST: 26 IU/L (ref 0–40)
Albumin/Globulin Ratio: 1.9 (ref 1.2–2.2)
Albumin: 4.9 g/dL — ABNORMAL HIGH (ref 3.8–4.8)
Alkaline Phosphatase: 76 IU/L (ref 44–121)
BUN/Creatinine Ratio: 16 (ref 12–28)
BUN: 13 mg/dL (ref 8–27)
Bilirubin Total: 0.5 mg/dL (ref 0.0–1.2)
CO2: 23 mmol/L (ref 20–29)
Calcium: 9.7 mg/dL (ref 8.7–10.3)
Chloride: 101 mmol/L (ref 96–106)
Creatinine, Ser: 0.82 mg/dL (ref 0.57–1.00)
GFR calc Af Amer: 87 mL/min/{1.73_m2} (ref >59)
GFR calc non Af Amer: 76 mL/min/{1.73_m2} (ref >59)
Globulin, Total: 2.6 g/dL (ref 1.5–4.5)
Glucose: 116 mg/dL — ABNORMAL HIGH (ref 65–99)
Potassium: 4.4 mmol/L (ref 3.5–5.2)
Sodium: 142 mmol/L (ref 134–144)
Total Protein: 7.5 g/dL (ref 6.0–8.5)

## 2020-07-24 ENCOUNTER — Other Ambulatory Visit: Payer: Self-pay | Admitting: Physician Assistant

## 2020-07-24 DIAGNOSIS — I10 Essential (primary) hypertension: Secondary | ICD-10-CM

## 2020-09-23 ENCOUNTER — Ambulatory Visit: Payer: Medicare HMO | Attending: Critical Care Medicine

## 2020-09-23 DIAGNOSIS — Z20822 Contact with and (suspected) exposure to covid-19: Secondary | ICD-10-CM

## 2020-09-25 LAB — SARS-COV-2, NAA 2 DAY TAT

## 2020-09-25 LAB — NOVEL CORONAVIRUS, NAA: SARS-CoV-2, NAA: NOT DETECTED

## 2020-10-01 ENCOUNTER — Other Ambulatory Visit: Payer: Self-pay | Admitting: Physician Assistant

## 2020-10-01 DIAGNOSIS — Z1231 Encounter for screening mammogram for malignant neoplasm of breast: Secondary | ICD-10-CM

## 2020-10-06 DIAGNOSIS — U071 COVID-19: Secondary | ICD-10-CM

## 2020-10-06 HISTORY — DX: COVID-19: U07.1

## 2020-10-22 ENCOUNTER — Other Ambulatory Visit: Payer: Self-pay | Admitting: Physician Assistant

## 2020-10-22 DIAGNOSIS — I10 Essential (primary) hypertension: Secondary | ICD-10-CM

## 2020-11-19 ENCOUNTER — Other Ambulatory Visit: Payer: Medicare HMO

## 2020-11-20 ENCOUNTER — Other Ambulatory Visit: Payer: Self-pay

## 2020-11-20 ENCOUNTER — Ambulatory Visit
Admission: RE | Admit: 2020-11-20 | Discharge: 2020-11-20 | Disposition: A | Discharge status: Home / Self Care | Payer: Medicare Other | Source: Ambulatory Visit | Attending: Physician Assistant | Admitting: Physician Assistant

## 2020-11-20 DIAGNOSIS — Z1231 Encounter for screening mammogram for malignant neoplasm of breast: Secondary | ICD-10-CM

## 2020-11-21 ENCOUNTER — Encounter: Payer: Medicare HMO | Admitting: Physician Assistant

## 2020-11-26 ENCOUNTER — Encounter: Payer: Medicare HMO | Admitting: Physician Assistant

## 2020-12-10 ENCOUNTER — Other Ambulatory Visit: Payer: Medicare Other

## 2020-12-10 ENCOUNTER — Other Ambulatory Visit: Payer: Self-pay | Admitting: Physician Assistant

## 2020-12-10 ENCOUNTER — Other Ambulatory Visit: Payer: Self-pay

## 2020-12-10 DIAGNOSIS — E559 Vitamin D deficiency, unspecified: Secondary | ICD-10-CM

## 2020-12-10 DIAGNOSIS — E782 Mixed hyperlipidemia: Secondary | ICD-10-CM

## 2020-12-10 DIAGNOSIS — R7303 Prediabetes: Secondary | ICD-10-CM

## 2020-12-10 DIAGNOSIS — Z0000 Encounter for general adult medical examination without abnormal findings: Secondary | ICD-10-CM

## 2020-12-10 DIAGNOSIS — I10 Essential (primary) hypertension: Secondary | ICD-10-CM

## 2020-12-11 ENCOUNTER — Encounter: Payer: Medicare Other | Admitting: Physician Assistant

## 2020-12-11 LAB — LIPID PANEL
Chol/HDL Ratio: 4.3 ratio (ref 0.0–4.4)
Cholesterol, Total: 204 mg/dL — ABNORMAL HIGH (ref 100–199)
HDL: 47 mg/dL (ref >39)
LDL Chol Calc (NIH): 125 mg/dL — ABNORMAL HIGH (ref 0–99)
Triglycerides: 183 mg/dL — ABNORMAL HIGH (ref 0–149)
VLDL Cholesterol Cal: 32 mg/dL (ref 5–40)

## 2020-12-11 LAB — CBC
Hematocrit: 43.4 % (ref 34.0–46.6)
Hemoglobin: 14.8 g/dL (ref 11.1–15.9)
MCH: 32.1 pg (ref 26.6–33.0)
MCHC: 34.1 g/dL (ref 31.5–35.7)
MCV: 94 fL (ref 79–97)
Platelets: 220 10*3/uL (ref 150–450)
RBC: 4.61 x10E6/uL (ref 3.77–5.28)
RDW: 12.2 % (ref 11.7–15.4)
WBC: 5.5 10*3/uL (ref 3.4–10.8)

## 2020-12-11 LAB — HEMOGLOBIN A1C
Est. average glucose Bld gHb Est-mCnc: 111 mg/dL
Hgb A1c MFr Bld: 5.5 % (ref 4.8–5.6)

## 2020-12-11 LAB — COMPREHENSIVE METABOLIC PANEL
ALT: 20 IU/L (ref 0–32)
AST: 25 IU/L (ref 0–40)
Albumin/Globulin Ratio: 2 (ref 1.2–2.2)
Albumin: 4.6 g/dL (ref 3.8–4.8)
Alkaline Phosphatase: 82 IU/L (ref 44–121)
BUN/Creatinine Ratio: 19 (ref 12–28)
BUN: 16 mg/dL (ref 8–27)
Bilirubin Total: 0.6 mg/dL (ref 0.0–1.2)
CO2: 26 mmol/L (ref 20–29)
Calcium: 9.5 mg/dL (ref 8.7–10.3)
Chloride: 102 mmol/L (ref 96–106)
Creatinine, Ser: 0.84 mg/dL (ref 0.57–1.00)
Globulin, Total: 2.3 g/dL (ref 1.5–4.5)
Glucose: 118 mg/dL — ABNORMAL HIGH (ref 65–99)
Potassium: 4.3 mmol/L (ref 3.5–5.2)
Sodium: 140 mmol/L (ref 134–144)
Total Protein: 6.9 g/dL (ref 6.0–8.5)
eGFR: 78 mL/min/{1.73_m2} (ref >59)

## 2020-12-11 LAB — TSH: TSH: 2.55 u[IU]/mL (ref 0.450–4.500)

## 2020-12-11 LAB — VITAMIN D 25 HYDROXY (VIT D DEFICIENCY, FRACTURES): Vit D, 25-Hydroxy: 44.3 ng/mL (ref 30.0–100.0)

## 2020-12-13 ENCOUNTER — Ambulatory Visit: Payer: Self-pay | Admitting: *Deleted

## 2020-12-13 NOTE — Telephone Encounter (Signed)
Patient called to report temp 100.8. congestion, coughing. Scheduled covid test for Monday. Would like to review symptoms.   Called patient to review symptoms. C/o temp today 100.8. symptoms started last night with cough, today feeling cold and has congestion, stuffy nose, muscle aches. Denies headache, chest pain, difficulty breathing. Attempted to get tested at pharmacy but unable to get assistance to sign up. Patient called [facility] to set up covid test for Monday 12/16/20. Reviewed with patient isolation precautions since symptoms started. Care advise given . Patient verbalized understanding of care advise and to call back or go to [facility] or ED if symptoms worsen. Encouraged patient to contact PCP on Monday . Patient requesting medication given for covid. Instructed patient to get tested and medical provider will review if antiviral is required.

## 2020-12-13 NOTE — Telephone Encounter (Signed)
Reason for Disposition  [1] COVID-19 infection suspected by caller or triager AND [2] mild symptoms (cough, fever, or others) AND [3] has not gotten tested yet  Answer Assessment - Initial Assessment Questions 1. COVID-19 DIAGNOSIS: "Who made your COVID-19 diagnosis?" "Was it confirmed by a positive lab test or self-test?" If not diagnosed by a doctor (or NP/PA), ask "Are there lots of cases (community spread) where you live?" Note: See public health department website, if unsure.     Not tested . Will test on Monday  2. COVID-19 EXPOSURE: "Was there any known exposure to COVID before the symptoms began?" CDC Definition of close contact: within 6 feet (2 meters) for a total of 15 minutes or more over a 24-hour period.      unknown 3. ONSET: "When did the COVID-19 symptoms start?"      Yesterday 12/12/20 4. WORST SYMPTOM: "What is your worst symptom?" (e.g., cough, fever, shortness of breath, muscle aches)     Cough, fever, muscle aches  5. COUGH: "Do you have a cough?" If Yes, ask: "How bad is the cough?"       Yes , getting worse 6. FEVER: "Do you have a fever?" If Yes, ask: "What is your temperature, how was it measured, and when did it start?"     100.8 7. RESPIRATORY STATUS: "Describe your breathing?" (e.g., shortness of breath, wheezing, unable to speak)      No  8. BETTER-SAME-WORSE: "Are you getting better, staying the same or getting worse compared to yesterday?"  If getting worse, ask, "In what way?"     na 9. HIGH RISK DISEASE: "Do you have any chronic medical problems?" (e.g., asthma, heart or lung disease, weak immune system, obesity, etc.)     no 10. VACCINE: "Have you had the COVID-19 vaccine?" If Yes, ask: "Which one, how many shots, when did you get it?"       Yes Pfizer  11. BOOSTER: "Have you received your COVID-19 booster?" If Yes, ask: "Which one and when did you get it?"       1 booster Pfizer 12. PREGNANCY: "Is there any chance you are pregnant?" "When was your last  menstrual period?"       na 13. OTHER SYMPTOMS: "Do you have any other symptoms?"  (e.g., chills, fatigue, headache, loss of smell or taste, muscle pain, sore throat)       Cold, congestion , coughing, muscle aches, 14. O2 SATURATION MONITOR:  "Do you use an oxygen saturation monitor (pulse oximeter) at home?" If Yes, ask "What is your reading (oxygen level) today?" "What is your usual oxygen saturation reading?" (e.g., 95%)       na  Protocols used: Coronavirus (COVID-19) Diagnosed or Suspected-A-AH

## 2020-12-16 ENCOUNTER — Ambulatory Visit: Payer: Medicare Other | Attending: Critical Care Medicine

## 2020-12-16 ENCOUNTER — Encounter: Payer: Medicare Other | Admitting: Physician Assistant

## 2020-12-16 DIAGNOSIS — Z20822 Contact with and (suspected) exposure to covid-19: Secondary | ICD-10-CM

## 2020-12-17 ENCOUNTER — Telehealth: Payer: Self-pay | Admitting: Physician Assistant

## 2020-12-17 LAB — SARS-COV-2, NAA 2 DAY TAT

## 2020-12-17 LAB — NOVEL CORONAVIRUS, NAA: SARS-CoV-2, NAA: DETECTED — AB

## 2020-12-17 NOTE — Telephone Encounter (Signed)
Patient has tested positive for COVID and symptoms include weakness, congestion, headache. Please advise, thanks.

## 2020-12-18 ENCOUNTER — Encounter: Payer: Self-pay | Admitting: Physician Assistant

## 2020-12-18 ENCOUNTER — Ambulatory Visit (INDEPENDENT_AMBULATORY_CARE_PROVIDER_SITE_OTHER): Payer: Medicare Other | Admitting: Physician Assistant

## 2020-12-18 ENCOUNTER — Other Ambulatory Visit (HOSPITAL_COMMUNITY): Payer: Self-pay

## 2020-12-18 VITALS — Ht 63.0 in | Wt 184.0 lb

## 2020-12-18 DIAGNOSIS — U071 COVID-19: Secondary | ICD-10-CM

## 2020-12-18 MED ORDER — NIRMATRELVIR/RITONAVIR (PAXLOVID)TABLET
3.0000 | ORAL_TABLET | Freq: Two times a day (BID) | ORAL | 0 refills | Status: AC
  Filled 2020-12-18: qty 30, 5d supply, fill #0

## 2020-12-18 NOTE — Patient Instructions (Signed)
10 Things You Can Do to Manage Your COVID-19 Symptoms at Home If you have possible or confirmed COVID-19 Stay home except to get medical care. Monitor your symptoms carefully. If your symptoms get worse, call your healthcare provider immediately. Get rest and stay hydrated. If you have a medical appointment, call the healthcare provider ahead of time and tell them that you have or may have COVID-19. For medical emergencies, call 911 and notify the dispatch personnel that you have or may have COVID-19. Cover your cough and sneezes with a tissue or use the inside of your elbow. Wash your hands often with soap and water for at least 20 seconds or clean your hands with an alcohol-based hand sanitizer that contains at least 60% alcohol. As much as possible, stay in a specific room and away from other people in your home. Also, you should use a separate bathroom, if available. If you need to be around other people in or outside of the home, wear a mask. Avoid sharing personal items with other people in your household, like dishes, towels, and bedding. Clean all surfaces that are touched often, like counters, tabletops, and doorknobs. Use household cleaning sprays or wipes according to the label instructions. cdc.gov/coronavirus 12/22/2019 This information is not intended to replace advice given to you by your health care provider. Make sure you discuss any questions you have with your healthcare provider. Document Revised: 07/12/2020 Document Reviewed: 07/12/2020 Elsevier Patient Education  2022 Elsevier Inc.  

## 2020-12-18 NOTE — Progress Notes (Signed)
Telehealth office visit note for Misty Reid, PA-C- at Primary Care at [facility]   I connected with current patient today by telephone and verified that I am speaking with the correct person    Location of the patient: Home  Location of the provider: Office - This visit type was conducted due to national recommendations for restrictions regarding the COVID-19 Pandemic (e.g. social distancing) in an effort to limit this patient's exposure and mitigate transmission in our community.    - No physical exam could be performed with this format, beyond that communicated to [place] by the patient/ family members as noted.   - Additionally my office staff/ schedulers were to discuss with the patient that there may be a monetary charge related to this service, depending on their medical insurance.  My understanding is that patient understood and consented to proceed.     _________________________________________________________________________________   History of Present Illness: Patient calls in with c/o Covid infection.  Patient reports started having symptoms 5 days ago with sore throat, congestion, productive cough, headache, cold chills, fever, some diarrhea and fatigue. Went for testing within [facility] 2 days ago which resulted positive.  States is drinking plenty of fluids and using Chloraseptic spray for sore throat which is effective.  States nasal congestion mildly improved.  Patient reports has received 3 doses of COVID-vaccine.  Denies chest pain, shortness of breath, wheezing or altered mental status. Patient reports her husband tested positive with an at-home Covid test and has been taking care of him too who has memory disorder and was more confused than his baseline which concerned her.     GAD 7 : Generalized Anxiety Score 03/28/2018 10/26/2016  Nervous, Anxious, on Edge 0 1  Control/stop worrying 3 1  Worry too much - different things 3 1  Trouble relaxing 0 0  Restless 0 0   Easily annoyed or irritable 0 1  Afraid - awful might happen 0 1  Total GAD 7 Score 6 5  Anxiety Difficulty - Somewhat difficult    Depression screen [facility] 2/9 12/18/2020 05/23/2020 11/24/2019 12/05/2018 09/12/2018  Decreased Interest 0 0 0 0 0  Down, Depressed, Hopeless 0 0 0 0 0  PHQ - 2 Score 0 0 0 0 0  Altered sleeping 0 _0 -  Tired, decreased energy 0 0 0 0 -  Change in appetite 0 0 0 0 -  Feeling bad or failure about yourself  0 0 0 0 -  Trouble concentrating 0 0 0 0 -  Moving slowly or fidgety/restless 0 0 1 0 -  Suicidal thoughts 0 0 0 0 -  PHQ-9 Score 0 _1 -  Difficult doing work/chores - - Not difficult at all Not difficult at all -  Some recent data might be hidden      Impression and Recommendations:     1. COVID-19 virus infection   -Patient is borderline within 5-day window of symptom onset and discussed management options including oral antiviral medication.  No medication interaction with hydrochlorothiazide and Paxlovid. Recent CMP 12/10/2020 creatinine 0.84, EGFR 78. -Recommend to continue home supportive care and monitor for worsening symptoms. -Discussed latest CDC isolation guidelines. -Follow up for rescheduled CPE.    - As part of my medical decision making, I reviewed the following data within the [place] History obtained from pt /family, CMA notes reviewed and incorporated if applicable, Labs reviewed, Radiograph/ tests reviewed if applicable and OV  notes from prior OV's with me, as well as any other specialists she/he has seen since seeing me last, were all reviewed and used in my medical decision making process today.    - Additionally, when appropriate, discussion had with patient regarding our treatment plan, and their biases/concerns about that plan were used in my medical decision making today.    - The patient agreed with the plan and demonstrated an understanding of the instructions.   No barriers to understanding were  identified.     - The patient was advised to call back or seek an in-person evaluation if the symptoms worsen or if the condition fails to improve as anticipated.   Return if symptoms worsen or fail to improve.    No orders of the defined types were placed in this encounter.   Meds ordered this encounter  Medications   nirmatrelvir/ritonavir EUA (PAXLOVID) TABS    Sig: Take 3 tablets by mouth 2 (two) times daily for 5 days. (Take nirmatrelvir 150 mg two tablets twice daily for 5 days and ritonavir 100 mg one tablet twice daily for 5 days)    Dispense:  30 tablet    Refill:  0    Patient GFR is 0.84  (78 on 12-10-20)    Order Specific Question:   Supervising Provider    Answer:   Beatrice Lecher D [[id number]]    There are no discontinued medications.     Time spent on telephone encounter was 17 minutes.   Note:  This note was prepared with assistance of Dragon voice recognition software. Occasional wrong-word or sound-a-like substitutions may have occurred due to the inherent limitations of voice recognition software.    The [place] was signed into law in 2016 which includes the topic of electronic health records.  This provides immediate access to information in MyChart.  This includes consultation notes, operative notes, office notes, lab results and pathology reports.  If you have any questions about what you read please let us know at your next visit or call us at the office.  We are right here with you.   __________________________________________________________________________________     Patient Care Team    Relationship Specialty Notifications Start End  Misty Decker, [place] PCP - General   10/08/19   Roseanne Kaufman, MD Consulting Physician Orthopedic Surgery  10/26/16   Druscilla Brownie, MD Consulting Physician Dermatology  10/26/16   Drema [name], MD  Physical Medicine and Rehabilitation  10/26/16   Glenna Fellows, MD Attending Physician  Neurosurgery  10/26/16   Shela Leff, MD Resident Internal Medicine  10/26/16   Armbruster, Carlota Raspberry, MD Consulting Physician Gastroenterology  02/26/17      -Vitals obtained; medications/ allergies reconciled;  personal medical, social, Sx etc.histories were updated by CMA, reviewed by me and are reflected in chart   Patient Active Problem List   Diagnosis Date Noted   Vitamin D deficiency 01/25/2019   Chronic bilateral low back pain without sciatica 01/25/2019   Insomnia 09/12/2018   Caregiver stress 03/28/2018   Spondylolisthesis, lumbar region 12/15/2017   Elevated blood sugar level 11/12/2017   Papanicolaou smear for cervical cancer screening 11/12/2017   Family history of breast cancer in mother 11/12/2017   Gastroesophageal reflux disease 09/27/2017   Obesity, Class I, BMI 30-34.9 09/27/2017   Stopped smoking with greater than 30 pack year history 02/26/2017   Family history of diabetes mellitus (DM)- Mom and sister 10/26/2016   Gastric erosions--- NSAID-related erosions 10/26/2016  Pre-diabetes 10/26/2016   Obesity (BMI 30.0-34.9) 10/26/2016   Statin intolerance- elevated LFT's and severe myalgias 10/26/2016   Elevated hemoglobin (HCC) 01/17/2015   Health care maintenance 78/67/6720   Nonalcoholic fatty liver disease- elevated LFT's 02/27/2014   Gastroesophageal reflux disease without esophagitis 05/08/2013   Cervicogenic headache 04/25/2012   Previous back surgery 05/05/2011   Back pain 05/05/2011   Dysthymic disorder 10/23/2009   Fibromyalgia 05/16/2009   Mixed hyperlipidemia 09/23/2006   Essential hypertension 09/23/2006   DEGENERATIVE JOINT DISEASE 09/23/2006   HYSTERECTOMY, TOTAL, HX OF 09/23/2006     Current Meds  Medication Sig   Calcium Carbonate-Vitamin D (CALCIUM-VITAMIN D) 600-125 MG-UNIT TABS Take 2 tablets by mouth 2 (two) times daily.   cetirizine (ZYRTEC) 10 MG chewable tablet Chew 10 mg by mouth daily.   Cinnamon 500 MG capsule Take 1,000 mg  by mouth daily.   Glucosamine-Chondroit-Vit C-Mn (GLUCOSAMINE 1500 COMPLEX PO) Take 1 tablet by mouth.   hydrochlorothiazide (HYDRODIURIL) 12.5 MG tablet TAKE 1 TABLET (12.5 MG TOTAL) BY MOUTH DAILY.   Multiple Vitamin (MULTIVITAMIN) capsule Take 1 capsule by mouth daily.   nirmatrelvir/ritonavir EUA (PAXLOVID) TABS Take 3 tablets by mouth 2 (two) times daily for 5 days. (Take nirmatrelvir 150 mg two tablets twice daily for 5 days and ritonavir 100 mg one tablet twice daily for 5 days)   Probiotic Product (PROBIOTIC DAILY PO) Take 1 tablet by mouth daily.   psyllium (METAMUCIL) 58.6 % powder Take 1 packet by mouth. Take 1 tsp daily   TURMERIC PO Take 1 tablet by mouth daily.   UNABLE TO FIND Take 900 mg by mouth. Med Name: Agal     Allergies:  Allergies  Allergen Reactions   Codeine     Nausea and vomiting, hallucinations   Morphine     B/P dropped!   Morphine And Related Shortness Of Breath   Other Itching and Diarrhea   Beef-Derived Products Nausea And Vomiting   Doxycycline Nausea And Vomiting   Ibuprofen     Caused RLS   Iodinated Diagnostic Agents    Latex     REACTION: rash   Nsaids Other (See Comments)    PT has hx of ulcers.   Penicillins     hives   Pork-Derived Products Nausea And Vomiting   Tramadol     Caused RLS     ROS:  See above HPI for pertinent positives and negatives   Objective:   Height 5' 3" (1.6 m), weight 184 lb (83.5 kg).   (if some vitals are omitted, this means that patient was UNABLE to obtain them.) General: A & O * 3; sounds in no acute distress Respiratory: speaking in full sentences, no conversational dyspnea Psych: insight appears good, mood- appears full

## 2021-01-02 ENCOUNTER — Ambulatory Visit (INDEPENDENT_AMBULATORY_CARE_PROVIDER_SITE_OTHER): Payer: Medicare Other | Admitting: Physician Assistant

## 2021-01-02 ENCOUNTER — Encounter: Payer: Self-pay | Admitting: Physician Assistant

## 2021-01-02 ENCOUNTER — Other Ambulatory Visit: Payer: Self-pay

## 2021-01-02 VITALS — BP 130/78 | HR 78 | Temp 98.5°F | Ht 63.0 in | Wt 190.5 lb

## 2021-01-02 DIAGNOSIS — Z0000 Encounter for general adult medical examination without abnormal findings: Secondary | ICD-10-CM | POA: Diagnosis not present

## 2021-01-02 DIAGNOSIS — E782 Mixed hyperlipidemia: Secondary | ICD-10-CM

## 2021-01-02 DIAGNOSIS — I10 Essential (primary) hypertension: Secondary | ICD-10-CM | POA: Diagnosis not present

## 2021-01-02 NOTE — Patient Instructions (Signed)
Preventive Care 68-65 Years Old, Female Preventive care refers to lifestyle choices and visits with your health care provider that can promote health and wellness. This includes: A yearly physical exam. This is also called an annual wellness visit. Regular dental and eye exams. Immunizations. Screening for certain conditions. Healthy lifestyle choices, such as: Eating a healthy diet. Getting regular exercise. Not using drugs or products that contain nicotine and tobacco. Limiting alcohol use. What can I expect for my preventive care visit? Physical exam Your health care provider will check your: Height and weight. These may be used to calculate your BMI (body mass index). BMI is a measurement that tells if you are at a healthy weight. Heart rate and blood pressure. Body temperature. Skin for abnormal spots. Counseling Your health care provider may ask you questions about your: Past medical problems. Family's medical history. Alcohol, tobacco, and drug use. Emotional well-being. Home life and relationship well-being. Sexual activity. Diet, exercise, and sleep habits. Work and work Statistician. Access to firearms. Method of birth control. Menstrual cycle. Pregnancy history. What immunizations do I need?  Vaccines are usually given at various ages, according to a schedule. Your health care provider will recommend vaccines for you based on your age, medicalhistory, and lifestyle or other factors, such as travel or where you work. What tests do I need? Blood tests Lipid and cholesterol levels. These may be checked every 5 years, or more often if you are over 65 years old. Hepatitis C test. Hepatitis B test. Screening Lung cancer screening. You may have this screening every year starting at age 65 if you have a 30-pack-year history of smoking and currently smoke or have quit within the past 15 years. Colorectal cancer screening. All adults should have this screening starting at  age 65 and continuing until age 65. Your health care provider may recommend screening at age 65 if you are at increased risk. You will have tests every 1-10 years, depending on your results and the type of screening test. Diabetes screening. This is done by checking your blood sugar (glucose) after you have not eaten for a while (fasting). You may have this done every 1-3 years. Mammogram. This may be done every 1-2 years. Talk with your health care provider about when you should start having regular mammograms. This may depend on whether you have a family history of breast cancer. BRCA-related cancer screening. This may be done if you have a family history of breast, ovarian, tubal, or peritoneal cancers. Pelvic exam and Pap test. This may be done every 3 years starting at age 65. Starting at age 65, this may be done every 5 years if you have a Pap test in combination with an HPV test. Other tests STD (sexually transmitted disease) testing, if you are at risk. Bone density scan. This is done to screen for osteoporosis. You may have this scan if you are at high risk for osteoporosis. Talk with your health care provider about your test results, treatment options,and if necessary, the need for more tests. Follow these instructions at home: Eating and drinking  Eat a diet that includes fresh fruits and vegetables, whole grains, lean protein, and low-fat dairy products. Take vitamin and mineral supplements as recommended by your health care provider. Do not drink alcohol if: Your health care provider tells you not to drink. You are pregnant, may be pregnant, or are planning to become pregnant. If you drink alcohol: Limit how much you have to 0-1 drink a day. Be aware  of how much alcohol is in your drink. In the [place] one drink equals one 12 oz bottle of beer (355 mL), one 5 oz glass of wine (148 mL), or one 1 oz glass of hard liquor (44 mL).  Lifestyle Take daily care of your teeth and  gums. Brush your teeth every morning and night with fluoride toothpaste. Floss one time each day. Stay active. Exercise for at least 30 minutes 5 or more days each week. Do not use any products that contain nicotine or tobacco, such as cigarettes, e-cigarettes, and chewing tobacco. If you need help quitting, ask your health care provider. Do not use drugs. If you are sexually active, practice safe sex. Use a condom or other form of protection to prevent STIs (sexually transmitted infections). If you do not wish to become pregnant, use a form of birth control. If you plan to become pregnant, see your health care provider for a prepregnancy visit. If told by your health care provider, take low-dose aspirin daily starting at age 65. Find healthy ways to cope with stress, such as: Meditation, yoga, or listening to music. Journaling. Talking to a trusted person. Spending time with friends and family. Safety Always wear your seat belt while driving or riding in a vehicle. Do not drive: If you have been drinking alcohol. Do not ride with someone who has been drinking. When you are tired or distracted. While texting. Wear a helmet and other protective equipment during sports activities. If you have firearms in your house, make sure you follow all gun safety procedures. What's next? Visit your health care provider once a year for an annual wellness visit. Ask your health care provider how often you should have your eyes and teeth checked. Stay up to date on all vaccines. This information is not intended to replace advice given to you by your health care provider. Make sure you discuss any questions you have with your healthcare provider. Document Revised: 02/27/2020 Document Reviewed: 02/03/2018 Elsevier Patient Education  2022 [employer].

## 2021-01-02 NOTE — Progress Notes (Signed)
Subjective:     Misty Decker is a 65 y.o. female and is here for a comprehensive physical exam. The patient reports no problems.  Social History   Socioeconomic History   Marital status: Married    Spouse name: Not on file   Number of children: Not on file   Years of education: Not on file   Highest education level: Not on file  Occupational History   Not on file  Tobacco Use   Smoking status: Former    Packs/day: 2.00    Years: 30.00    Pack years: 60.00    Types: Cigarettes    Quit date: 04/03/2003    Years since quitting: 17.7   Smokeless tobacco: Never  Vaping Use   Vaping Use: Never used  Substance and Sexual Activity   Alcohol use: Not Currently    Alcohol/week: 0.0 standard drinks   Drug use: No   Sexual activity: Not Currently  Other Topics Concern   Not on file  Social History Narrative   Not on file   Social Determinants of Health   Financial Resource Strain: Not on file  Food Insecurity: Not on file  Transportation Needs: Not on file  Physical Activity: Not on file  Stress: Not on file  Social Connections: Not on file  Intimate Partner Violence: Not on file   Health Maintenance  Topic Date Due   Zoster Vaccines- Shingrix (1 of 2) Never done   COVID-19 Vaccine (4 - Booster for Pfizer series) 09/08/2020   INFLUENZA VACCINE  01/06/2021   TETANUS/TDAP  05/04/2021   COLONOSCOPY (Pts 45-25yrs Insurance coverage will need to be confirmed)  10/21/2022   MAMMOGRAM  11/21/2022   Hepatitis C Screening  Completed   HIV Screening  Completed   Pneumococcal Vaccine 65-86 Years old  Aged Out   HPV VACCINES  Aged Out   PAP SMEAR-Modifier  Discontinued    The following portions of the patient's history were reviewed and updated as appropriate: allergies, current medications, past family history, past medical history, past social history, past surgical history, and problem list.  Review of Systems Pertinent items noted in HPI and remainder of comprehensive  ROS otherwise negative.   Objective:    BP 130/78   Pulse 78   Temp 98.5 F (36.9 C)   Ht 5\' 3"  (1.6 m)   Wt 190 lb 8 oz (86.4 kg)   SpO2 98%   BMI 33.75 kg/m  General appearance: alert, cooperative, and no distress Head: Normocephalic, without obvious abnormality, atraumatic Eyes: conjunctivae/corneas clear. PERRL, EOM's intact. Fundi benign. Ears: abnormal external canal right ear - ceruminosis impacting canal and abnormal external canal left ear - ceruminosis impacting canal Nose: Nares normal. Septum midline. Mucosa normal. No drainage or sinus tenderness. Throat: lips, mucosa, and tongue normal; teeth and gums normal Neck: no adenopathy, no carotid bruit, no JVD, supple, symmetrical, trachea midline, and thyroid not enlarged, symmetric, no tenderness/mass/nodules Back: no tenderness to percussion or palpation, surgical scar Lungs: clear to auscultation bilaterally Heart: regular rate and rhythm, S1, S2 normal, no murmur, click, rub or gallop Abdomen: soft, non-tender; bowel sounds normal; no masses,  no organomegaly Extremities: extremities normal, atraumatic, no cyanosis or edema Pulses: 2+ and symmetric Skin: mobility and turgor normal, no edema, and temperature normal Lymph nodes: Cervical adenopathy: normal and Supraclavicular adenopathy: normal Neurologic: Grossly normal    Assessment:    Healthy female exam.     Plan:  -Discussed most recent labs which are essentially  within normal limits or stable from prior.  Lipid panel has mildly improved.  Recommend to continue with reduction of fat intake. Continue current medication regimen. -UTD on mammogram, colonoscopy, Tdap, HIV and hep C screening.  Patient is in the process of obtaining new insurance and will inquire coverage for Shingrix and pneumococcal vaccines. -Patient has a history of cervical fusion C5-C6, C6-C7 T1 (2014) and lumbar spondylolisthesis, with new insurance plans to follow-up with  neurosurgery. -Follow-up in 6 months for HTN, HLD  See After Visit Summary for Counseling Recommendations

## 2021-01-20 ENCOUNTER — Other Ambulatory Visit: Payer: Self-pay | Admitting: Physician Assistant

## 2021-01-20 DIAGNOSIS — I10 Essential (primary) hypertension: Secondary | ICD-10-CM

## 2021-03-02 ENCOUNTER — Ambulatory Visit (INDEPENDENT_AMBULATORY_CARE_PROVIDER_SITE_OTHER): Payer: Medicare Other

## 2021-03-02 DIAGNOSIS — Z0000 Encounter for general adult medical examination without abnormal findings: Secondary | ICD-10-CM

## 2021-03-02 NOTE — Progress Notes (Signed)
Subjective:  I connected with  Misty Decker on 03/02/21 by an audio only telemedicine application and verified that I am speaking with the correct person using two identifiers.   I discussed the limitations, risks, security and privacy concerns of performing an evaluation and management service by telephone and the availability of in person appointments. I also discussed with the patient that there may be a patient responsible charge related to this service. The patient expressed understanding and verbally consented to this telephonic visit.  Location of Patient: Home Location of Provider: Office  List any persons and their role that are participating in the visit with the patient. None  Review of Systems    Defer to PCP       Objective:    Today's Vitals   03/02/21 0820  PainSc: 5    There is no height or weight on file to calculate BMI.  Advanced Directives 03/02/2021 10/20/2017 10/26/2016 03/10/2016 01/21/2016 12/17/2015 09/21/2015  Does Patient Have a Medical Advance Directive? Yes Yes Yes Yes Yes No;Yes No  Type of Advance Directive - Living will;Healthcare Power of [place] -  Does patient want to make changes to medical advance directive? Yes (Inpatient - patient defers changing a medical advance directive and declines information at this time) - - - No - Patient declined - -  Copy of [place] in Chart? - - - No - copy requested No - copy requested No - copy requested -  Would patient like information on creating a medical advance directive? - - - - - - No - patient declined information    Current Medications (verified) Outpatient Encounter Medications as of 03/02/2021  Medication Sig   Calcium Carbonate-Vitamin D (CALCIUM-VITAMIN D) 600-125 MG-UNIT TABS Take 2 tablets by mouth 2 (two) times daily.   Cinnamon 500 MG capsule Take 1,000 mg  by mouth daily.   Glucosamine-Chondroit-Vit C-Mn (GLUCOSAMINE 1500 COMPLEX PO) Take 1 tablet by mouth.   hydrochlorothiazide (HYDRODIURIL) 12.5 MG tablet TAKE 1 TABLET (12.5 MG TOTAL) BY MOUTH DAILY.   Multiple Vitamin (MULTIVITAMIN) capsule Take 1 capsule by mouth daily.   Probiotic Product (PROBIOTIC DAILY PO) Take 1 tablet by mouth daily.   psyllium (METAMUCIL) 58.6 % powder Take 1 packet by mouth. Take 1 tsp daily   TURMERIC PO Take 1 tablet by mouth daily.   [DISCONTINUED] UNABLE TO FIND Take 900 mg by mouth. Med Name: Agal   No facility-administered encounter medications on file as of 03/02/2021.    Allergies (verified) Codeine, Morphine, Morphine and related, Other, Beef-derived products, Doxycycline, Ibuprofen, Iodinated diagnostic agents, Latex, Nsaids, Penicillins, Pork-derived products, and Tramadol   History: Past Medical History:  Diagnosis Date   Complication of anesthesia    "Hard to sedate", per pt   Degenerative joint disease    s/p cervical and lumbar fusions   Depression    Encounter for preventive health examination    Next mammogram in 07/2009. Next colonoscopy in 09/2017. DEXA 4/09>Normal   Encounter for screening colonoscopy 10/03/2007   Normal   Fibromyalgia    Hemorrhoid    hx of   Hyperlipidemia    Hypertension    Sciatica    Past Surgical History:  Procedure Laterality Date   ABDOMINAL HYSTERECTOMY N/A    [facility] 11/21/2019   APPENDECTOMY N/A    [facility] 11/21/2019   CERVICAL FUSION  2010   X 2  LUMBAR FUSION  1997   X 2/has plate and screws and cage   OVARIAN CYST SURGERY  1980   pelvic prolapse  1987   RECTAL PROLAPSE REPAIR     SPINE SURGERY N/A    [facility] 11/21/2019   TONSILLECTOMY     and adenoids   TOTAL ABDOMINAL HYSTERECTOMY  1987   Family History  Problem Relation Age of Onset   Cancer Mother    Diabetes Mother    Breast cancer Mother    Heart attack Mother    Hyperlipidemia Mother    Hyperlipidemia Father     Hypertension Father    Aneurysm Father    Dementia Maternal Grandfather    Cancer Paternal Grandmother        intestinal   Social History   Socioeconomic History   Marital status: Married    Spouse name: Not on file   Number of children: Not on file   Years of education: Not on file   Highest education level: Not on file  Occupational History   Not on file  Tobacco Use   Smoking status: Former    Packs/day: 2.00    Years: 30.00    Pack years: 60.00    Types: Cigarettes    Quit date: 04/03/2003    Years since quitting: 17.9   Smokeless tobacco: Never  Vaping Use   Vaping Use: Never used  Substance and Sexual Activity   Alcohol use: Not Currently    Alcohol/week: 0.0 standard drinks   Drug use: No   Sexual activity: Not Currently  Other Topics Concern   Not on file  Social History Narrative   Not on file   Social Determinants of Health   Financial Resource Strain: Medium Risk   Difficulty of Paying Living Expenses: Somewhat hard  Food Insecurity: No Food Insecurity   Worried About Charity fundraiser in the Last Year: Never true   Ran Out of Food in the Last Year: Never true  Transportation Needs: No Transportation Needs   Lack of Transportation (Medical): No   Lack of Transportation (Non-Medical): No  Physical Activity: Inactive   Days of Exercise per Week: 0 days   Minutes of Exercise per Session: 0 min  Stress: Stress Concern Present   Feeling of Stress : To some extent  Social Connections: Moderately Integrated   Frequency of Communication with Friends and Family: More than three times a week   Frequency of Social Gatherings with Friends and Family: More than three times a week   Attends Religious Services: More than 4 times per year   Active Member of [employer] or Organizations: No   Attends Music therapist: Never   Marital Status: Married    Tobacco Counseling Counseling given: Not Answered   Clinical Intake:  Pre-visit preparation  completed: Yes  Pain : 0-10 Pain Score: 5  Pain Type: Chronic pain Pain Location: Back Pain Orientation: Upper     Diabetes: No  How often do you need to have someone help you when you read instructions, pamphlets, or other written materials from your doctor or pharmacy?: 1 - Never  Diabetic?No  Interpreter Needed?: No      Activities of Daily Living In your present state of health, do you have any difficulty performing the following activities: 03/02/2021 01/02/2021  Hearing? N N  Vision? N N  Difficulty concentrating or making decisions? N N  Walking or climbing stairs? Y N  Comment Pt has chronic back pain -  Dressing or bathing? N N  Doing errands, shopping? N N  Preparing Food and eating ? N -  Using the Toilet? N -  In the past six months, have you accidently leaked urine? N -  Do you have problems with loss of bowel control? N -  Managing your Medications? N -  Managing your Finances? N -  Housekeeping or managing your Housekeeping? N -  Some recent data might be hidden    Patient Care Team: Lorrene Reid, PA-C as PCP - General Roseanne Kaufman, MD as Consulting Physician (Orthopedic Surgery) Druscilla Brownie, MD as Consulting Physician (Dermatology) Dalton-Bethea, Fabio Asa, MD (Physical Medicine and Rehabilitation) Glenna Fellows, MD as Attending Physician (Neurosurgery) Shela Leff, MD as Resident (Internal Medicine) Armbruster, Carlota Raspberry, MD as Consulting Physician (Gastroenterology)  Indicate any recent Medical Services you may have received from other than [facility] providers in the past year (date may be approximate).     Assessment:   This is a routine wellness examination for Misty Decker.  Hearing/Vision screen No results found.  Dietary issues and exercise activities discussed:     Goals Addressed   None    Depression Screen PHQ 2/9 Scores 03/02/2021 01/02/2021 12/18/2020 05/23/2020 11/24/2019 12/05/2018 09/12/2018  PHQ - 2 Score 0 0 0 0 0 0 0  PHQ- 9  Score 0 0 0 1 2 1  -    Fall Risk Fall Risk  03/02/2021 01/02/2021 12/18/2020 05/23/2020 12/05/2018  Falls in the past year? 0 0 0 0 0  Number falls in past yr: 0 0 0 - -  Injury with Fall? 0 0 0 - -  Risk Factor Category  - - - - -  Risk for fall due to : - No Fall Risks No Fall Risks - -  Risk for fall due to: Comment - - - - -  Follow up - Falls evaluation completed Falls evaluation completed Falls evaluation completed Falls evaluation completed    FALL RISK PREVENTION PERTAINING TO THE HOME:  Any stairs in or around the home? Yes  If so, are there any without handrails? Yes  Home free of loose throw rugs in walkways, pet beds, electrical cords, etc? Yes  Adequate lighting in your home to reduce risk of falls? Yes   ASSISTIVE DEVICES UTILIZED TO PREVENT FALLS:  Life alert? No  Use of a cane, walker or w/c? No  Grab bars in the bathroom? No  Shower chair or bench in shower? No  Elevated toilet seat or a handicapped toilet? No   TIMED UP AND GO:  Was the test performed?  N/A .  Length of time to ambulate 10 feet: N/A sec.   These questions cannot be answered via Telephone Encounter.  Cognitive Function:     6CIT Screen 03/02/2021  What Year? 0 points  What month? 0 points  What time? 0 points  Count back from 20 0 points  Months in reverse 0 points  Repeat phrase 0 points  Total Score 0    Immunizations Immunization History  Administered Date(s) Administered   Hepatitis A, Adult 10/04/2014, 11/02/2014   Hepatitis B, ped/adol 10/04/2014, 11/02/2014, 04/04/2015   Influenza Split 02/27/2011, 03/22/2012   Influenza Whole 02/23/2008, 05/16/2009, 02/19/2010   Influenza,inj,Quad PF,6+ Mos 03/20/2013, 04/26/2014, 04/04/2015, 03/10/2016, 03/23/2017, 03/28/2018   Influenza-Unspecified 03/14/2019   PFIZER(Purple Top)SARS-COV-2 Vaccination 09/07/2019, 09/26/2019, 05/10/2020   Pneumococcal Polysaccharide-23 01/21/2016   Tdap 05/05/2011    TDAP status: Up to date  Flu  Vaccine status: Due, Education has been  provided regarding the importance of this vaccine. Advised may receive this vaccine at local pharmacy or Health Dept. Aware to provide a copy of the vaccination record if obtained from local pharmacy or Health Dept. Verbalized acceptance and understanding.  Pneumococcal vaccine status: Due, Education has been provided regarding the importance of this vaccine. Advised may receive this vaccine at local pharmacy or Health Dept. Aware to provide a copy of the vaccination record if obtained from local pharmacy or Health Dept. Verbalized acceptance and understanding.  Covid-19 vaccine status: Completed vaccines  Qualifies for Shingles Vaccine? Yes   Zostavax completed No   Shingrix Completed?: Yes  Screening Tests Health Maintenance  Topic Date Due   Zoster Vaccines- Shingrix (1 of 2) Never done   COVID-19 Vaccine (4 - Booster for Pfizer series) 09/08/2020   INFLUENZA VACCINE  01/06/2021   TETANUS/TDAP  05/04/2021   COLONOSCOPY (Pts 45-53yrs Insurance coverage will need to be confirmed)  10/21/2022   MAMMOGRAM  11/21/2022   Hepatitis C Screening  Completed   HIV Screening  Completed   HPV VACCINES  Aged Out   PAP SMEAR-Modifier  Discontinued    Health Maintenance  Health Maintenance Due  Topic Date Due   Zoster Vaccines- Shingrix (1 of 2) Never done   COVID-19 Vaccine (4 - Booster for Pfizer series) 09/08/2020   INFLUENZA VACCINE  01/06/2021    Colorectal cancer screening: Type of screening: Colonoscopy. Completed 2019. Repeat every 5 years  Mammogram status: Completed 2022. Repeat every year  Bone Density status: Completed 2020. Results reflect: Bone density results: OSTEOPOROSIS. Repeat every 2 years.  Lung Cancer Screening: (Low Dose CT Chest recommended if Age 30-80 years, 30 pack-year currently smoking OR have quit w/in 15years.) does qualify.   Lung Cancer Screening Referral: Needed  Additional Screening:  Hepatitis C Screening:  does qualify; Completed 2019  Vision Screening: Recommended annual ophthalmology exams for early detection of glaucoma and other disorders of the eye. Is the patient up to date with their annual eye exam?  Yes  Who is the provider or what is the name of the office in which the patient attends annual eye exams? Dr Idolina Primer If pt is not established with a provider, would they like to be referred to a provider to establish care? No .   Dental Screening: Recommended annual dental exams for proper oral hygiene  Community Resource Referral / Chronic Care Management: CRR required this visit?  No   CCM required this visit?  No      Plan:     I have personally reviewed and noted the following in the patient's chart:   Medical and social history Use of alcohol, tobacco or illicit drugs  Current medications and supplements including opioid prescriptions.  Functional ability and status Nutritional status Physical activity Advanced directives List of other physicians Hospitalizations, surgeries, and ER visits in previous 12 months Vitals Screenings to include cognitive, depression, and falls Referrals and appointments  In addition, I have reviewed and discussed with patient certain preventive protocols, quality metrics, and best practice recommendations. A written personalized care plan for preventive services as well as general preventive health recommendations were provided to patient.     Lauralyn Primes, RMA   03/02/2021   Nurse Notes: Non Face to Face 45 minute visit encounter   Misty Decker , Thank you for taking time to come for your Medicare Wellness Visit. I appreciate your ongoing commitment to your health goals. Please review the following plan we discussed and let  me know if I can assist you in the future.   These are the goals we discussed:  Goals      Prevent Falls        This is a list of the screening recommended for you and due dates:  Health Maintenance  Topic  Date Due   Zoster (Shingles) Vaccine (1 of 2) Never done   COVID-19 Vaccine (4 - Booster for Pfizer series) 09/08/2020   Flu Shot  01/06/2021   Tetanus Vaccine  05/04/2021   Colon Cancer Screening  10/21/2022   Mammogram  11/21/2022   Hepatitis C Screening: USPSTF Recommendation to screen - Ages 18-79 yo.  Completed   HIV Screening  Completed   HPV Vaccine  Aged Out   Pap Smear  Discontinued

## 2021-03-16 LAB — HM HEPATITIS C SCREENING LAB: HM Hepatitis Screen: NEGATIVE

## 2021-03-31 ENCOUNTER — Encounter: Payer: Self-pay | Admitting: Physician Assistant

## 2021-04-18 ENCOUNTER — Other Ambulatory Visit: Payer: Self-pay | Admitting: Physician Assistant

## 2021-04-18 DIAGNOSIS — I10 Essential (primary) hypertension: Secondary | ICD-10-CM

## 2021-05-13 ENCOUNTER — Ambulatory Visit (INDEPENDENT_AMBULATORY_CARE_PROVIDER_SITE_OTHER): Payer: Medicare Other

## 2021-05-13 ENCOUNTER — Other Ambulatory Visit: Payer: Self-pay

## 2021-05-13 VITALS — BP 142/76 | HR 76 | Temp 97.6°F | Ht 63.0 in | Wt 190.0 lb

## 2021-05-13 DIAGNOSIS — Z23 Encounter for immunization: Secondary | ICD-10-CM

## 2021-05-13 NOTE — Progress Notes (Signed)
HPI - Patient came in for their annual influenza vaccine and second shingles vaccine. Patient denies allergies to eggs and latex. Denies having an adverse reaction to either vaccine in the past.  Assessment and Plan - Patient tolerated vaccine well. [facility] updated accordingly.

## 2021-07-07 ENCOUNTER — Other Ambulatory Visit: Payer: Self-pay

## 2021-07-07 ENCOUNTER — Ambulatory Visit
Admission: RE | Admit: 2021-07-07 | Discharge: 2021-07-07 | Disposition: A | Discharge status: Home / Self Care | Payer: 59 | Source: Ambulatory Visit | Attending: Physician Assistant | Admitting: Physician Assistant

## 2021-07-07 ENCOUNTER — Encounter: Payer: Self-pay | Admitting: Physician Assistant

## 2021-07-07 ENCOUNTER — Ambulatory Visit (INDEPENDENT_AMBULATORY_CARE_PROVIDER_SITE_OTHER): Payer: 59 | Admitting: Physician Assistant

## 2021-07-07 VITALS — BP 146/68 | HR 70 | Temp 97.7°F | Ht 63.0 in | Wt 197.0 lb

## 2021-07-07 DIAGNOSIS — Z981 Arthrodesis status: Secondary | ICD-10-CM

## 2021-07-07 DIAGNOSIS — R21 Rash and other nonspecific skin eruption: Secondary | ICD-10-CM

## 2021-07-07 DIAGNOSIS — I10 Essential (primary) hypertension: Secondary | ICD-10-CM | POA: Diagnosis not present

## 2021-07-07 DIAGNOSIS — M4316 Spondylolisthesis, lumbar region: Secondary | ICD-10-CM

## 2021-07-07 DIAGNOSIS — E782 Mixed hyperlipidemia: Secondary | ICD-10-CM | POA: Diagnosis not present

## 2021-07-07 MED ORDER — EZETIMIBE 10 MG PO TABS: 10.0000 mg | ORAL_TABLET | Freq: Every day | ORAL | 1 refills | Status: DC

## 2021-07-07 MED ORDER — TRIAMCINOLONE ACETONIDE 0.5 % EX OINT: 1.0000 "application " | TOPICAL_OINTMENT | Freq: Two times a day (BID) | CUTANEOUS | 0 refills | Status: DC

## 2021-07-07 NOTE — Assessment & Plan Note (Signed)
-  BP mildly elevated in office, does report having back pain today. Will continue to monitor and reassess at follow up visit.  -Continue current medication regimen. See med list. -Will collect CMP for medication monitoring.

## 2021-07-07 NOTE — Assessment & Plan Note (Signed)
-  Last lipid panel: HDL 47, LDL 125 -The 10-year ASCVD risk score (Arnett DK, et al., 2019) is: 10.4% -Patient unable to tolerate statins. Patient agreeable to trial non-statin Zetia.  -Will continue with dietary and lifestyle interventions.  -Pt is fasting, will collect lipid panel.

## 2021-07-07 NOTE — Progress Notes (Signed)
Established Patient Office Visit  Subjective:  Patient ID: Misty Decker, female    DOB: [birth year]  Age: 66 y.o. MRN: [id number]  CC:  Chief Complaint  Patient presents with   Follow-up    HPI Misty Decker presents for follow up on hypertension and hyperlipidemia. Patient has a hx of cervical and lumbar spinal fusion and reports having issues with low back and neck pain. States last visit with neurosurgeon was about 2 years ago. States in the past has tried injections which didn't work and also medications including gabapentin which she did not tolerate. Also c/o of itchy and tender rash of LE. Has been applying witch hazel and hydrocortisone cream.  HTN: Pt denies chest pain, palpitations, dizziness or lower extremity swelling. Taking medication as directed without side effects.   HLD: Patient has hx of statin intolerance. Patient tries to have a balanced diet low in fat. Stays active with outdoor activities and house work.    Past Medical History:  Diagnosis Date   Complication of anesthesia    "Hard to sedate", per pt   Degenerative joint disease    s/p cervical and lumbar fusions   Depression    Encounter for preventive health examination    Next mammogram in 07/2009. Next colonoscopy in 09/2017. DEXA 4/09>Normal   Encounter for screening colonoscopy 10/03/2007   Normal   Fibromyalgia    Hemorrhoid    hx of   Hyperlipidemia    Hypertension    Sciatica     Past Surgical History:  Procedure Laterality Date   ABDOMINAL HYSTERECTOMY N/A    [facility] 11/21/2019   APPENDECTOMY N/A    [employer] 11/21/2019   CERVICAL FUSION  2010   X 2   LUMBAR FUSION  1997   X 2/has plate and screws and cage   OVARIAN CYST SURGERY  1980   pelvic prolapse  1987   RECTAL PROLAPSE REPAIR     SPINE SURGERY N/A    [facility] 11/21/2019   TONSILLECTOMY     and adenoids   TOTAL ABDOMINAL HYSTERECTOMY  1987    Family History  Problem Relation Age of Onset   Cancer Mother     Diabetes Mother    Breast cancer Mother    Heart attack Mother    Hyperlipidemia Mother    Hyperlipidemia Father    Hypertension Father    Aneurysm Father    Dementia Maternal Grandfather    Cancer Paternal Grandmother        intestinal    Social History   Socioeconomic History   Marital status: Married    Spouse name: Not on file   Number of children: Not on file   Years of education: Not on file   Highest education level: Not on file  Occupational History   Not on file  Tobacco Use   Smoking status: Former    Packs/day: 2.00    Years: 30.00    Pack years: 60.00    Types: Cigarettes    Quit date: 04/03/2003    Years since quitting: 18.2   Smokeless tobacco: Never  Vaping Use   Vaping Use: Never used  Substance and Sexual Activity   Alcohol use: Not Currently    Alcohol/week: 0.0 standard drinks   Drug use: No   Sexual activity: Not Currently  Other Topics Concern   Not on file  Social History Narrative   Not on file   Social Determinants of Health   Financial Resource Strain: Medium  Risk   Difficulty of Paying Living Expenses: Somewhat hard  Food Insecurity: No Food Insecurity   Worried About Running Out of Food in the Last Year: Never true   Ran Out of Food in the Last Year: Never true  Transportation Needs: No Transportation Needs   Lack of Transportation (Medical): No   Lack of Transportation (Non-Medical): No  Physical Activity: Inactive   Days of Exercise per Week: 0 days   Minutes of Exercise per Session: 0 min  Stress: Stress Concern Present   Feeling of Stress : To some extent  Social Connections: Moderately Integrated   Frequency of Communication with Friends and Family: More than three times a week   Frequency of Social Gatherings with Friends and Family: More than three times a week   Attends Religious Services: More than 4 times per year   Active Member of [employer] or Organizations: No   Attends Archivist Meetings: Never   Marital  Status: Married  Human resources officer Violence: Not At Risk   Fear of Current or Ex-Partner: No   Emotionally Abused: No   Physically Abused: No   Sexually Abused: No    Outpatient Medications Prior to Visit  Medication Sig Dispense Refill   Calcium Carbonate-Vitamin D (CALCIUM-VITAMIN D) 600-125 MG-UNIT TABS Take 2 tablets by mouth 2 (two) times daily.     Cinnamon 500 MG capsule Take 1,000 mg by mouth daily.     Glucosamine-Chondroit-Vit C-Mn (GLUCOSAMINE 1500 COMPLEX PO) Take 1 tablet by mouth.     hydrochlorothiazide (HYDRODIURIL) 12.5 MG tablet TAKE 1 TABLET (12.5 MG TOTAL) BY MOUTH DAILY. 90 tablet 0   Multiple Vitamin (MULTIVITAMIN) capsule Take 1 capsule by mouth daily.     Probiotic Product (PROBIOTIC DAILY PO) Take 1 tablet by mouth daily.     psyllium (METAMUCIL) 58.6 % powder Take 1 packet by mouth. Take 1 tsp daily     TURMERIC PO Take 1 tablet by mouth daily.     No facility-administered medications prior to visit.    Allergies  Allergen Reactions   Codeine     Nausea and vomiting, hallucinations   Morphine     B/P dropped!   Morphine And Related Shortness Of Breath   Other Itching and Diarrhea   Beef-Derived Products Nausea And Vomiting   Doxycycline Nausea And Vomiting   Ibuprofen     Caused RLS   Iodinated Contrast Media    Latex     REACTION: rash   Nsaids Other (See Comments)    PT has hx of ulcers.   Penicillins     hives   Pork-Derived Products Nausea And Vomiting   Tramadol     Caused RLS    ROS Review of Systems Review of Systems:  A fourteen system review of systems was performed and found to be positive as per HPI.   Objective:    Physical Exam General:  Pleasant and cooperative, in no acute distress   Neuro:  Alert and oriented,  extra-ocular muscles intact  HEENT:  Normocephalic, atraumatic, neck supple  Skin:  erythematous macules and papules noted of LE (legs and gluteal region), no vesicles or purulent drainage noted Cardiac:  RRR,  S1 S2 Respiratory: CTA B/L  MSK: tenderness of low back (L>R), healed surgical scars noted, mild asymmetry  Vascular:  Ext warm, no cyanosis apprec.; cap RF less 2 sec. Psych:  No HI/SI, judgement and insight good, Euthymic mood. Full Affect.  BP (!) 146/68    Pulse  70    Temp 97.7 F (36.5 C)    Ht $R'5\' 3"'lL$  (1.6 m)    Wt 197 lb (89.4 kg)    SpO2 98%    BMI 34.90 kg/m  Wt Readings from Last 3 Encounters:  07/07/21 197 lb (89.4 kg)  05/13/21 190 lb (86.2 kg)  01/02/21 190 lb 8 oz (86.4 kg)     Health Maintenance Due  Topic Date Due   Pneumonia Vaccine 72+ Years old (2 - PCV) 01/20/2017   TETANUS/TDAP  05/04/2021    There are no preventive care reminders to display for this patient.  Lab Results  Component Value Date   TSH 2.550 12/10/2020   Lab Results  Component Value Date   WBC 5.5 12/10/2020   HGB 14.8 12/10/2020   HCT 43.4 12/10/2020   MCV 94 12/10/2020   PLT 220 12/10/2020   Lab Results  Component Value Date   NA 140 12/10/2020   K 4.3 12/10/2020   CO2 26 12/10/2020   GLUCOSE 118 (H) 12/10/2020   BUN 16 12/10/2020   CREATININE 0.84 12/10/2020   BILITOT 0.6 12/10/2020   ALKPHOS 82 12/10/2020   AST 25 12/10/2020   ALT 20 12/10/2020   PROT 6.9 12/10/2020   ALBUMIN 4.6 12/10/2020   CALCIUM 9.5 12/10/2020   ANIONGAP 12 10/07/2015   EGFR 78 12/10/2020   Lab Results  Component Value Date   CHOL 204 (H) 12/10/2020   Lab Results  Component Value Date   HDL 47 12/10/2020   Lab Results  Component Value Date   LDLCALC 125 (H) 12/10/2020   Lab Results  Component Value Date   TRIG 183 (H) 12/10/2020   Lab Results  Component Value Date   CHOLHDL 4.3 12/10/2020   Lab Results  Component Value Date   HGBA1C 5.5 12/10/2020      Assessment & Plan:   Problem List Items Addressed This Visit       Cardiovascular and Mediastinum   Essential hypertension - Primary (Chronic)    -BP mildly elevated in office, does report having back pain today. Will  continue to monitor and reassess at follow up visit.  -Continue current medication regimen. See med list. -Will collect CMP for medication monitoring.       Relevant Medications   ezetimibe (ZETIA) 10 MG tablet   Other Relevant Orders   Comp Met (CMET)   CBC w/Diff     Musculoskeletal and Integument   Spondylolisthesis, lumbar region   Relevant Orders   DG Cervical Spine Complete   DG Lumbar Spine Complete     Other   Mixed hyperlipidemia (Chronic)    -Last lipid panel: HDL 47, LDL 125 -The 10-year ASCVD risk score (Arnett DK, et al., 2019) is: 10.4% -Patient unable to tolerate statins. Patient agreeable to trial non-statin Zetia.  -Will continue with dietary and lifestyle interventions.  -Pt is fasting, will collect lipid panel.      Relevant Medications   ezetimibe (ZETIA) 10 MG tablet   Other Relevant Orders   Lipid Profile   Other Visit Diagnoses     S/P cervical spinal fusion       Relevant Orders   DG Cervical Spine Complete   DG Lumbar Spine Complete   Rash and nonspecific skin eruption       Relevant Medications   triamcinolone ointment (KENALOG) 0.5 %      S/p cervical spinal fusion; Spondylolisthesis, lumbar region:  -Discussed with patient recommend to follow-up with Neurosurgery.  Patient prefers to proceed with imaging studies before proceeding with referral. Placed orders for cervical and lumbar x-ray. Reviewed [facility] neurosurgery notes.  Rash and non-specific skin eruption: -Etiology unclear. Recommend to apply topical steroid BID until resolution. Recommend to avoid scratching.   Meds ordered this encounter  Medications   ezetimibe (ZETIA) 10 MG tablet    Sig: Take 1 tablet (10 mg total) by mouth daily.    Dispense:  90 tablet    Refill:  1    Order Specific Question:   Supervising Provider    Answer:   Beatrice Lecher D [[id number]]   triamcinolone ointment (KENALOG) 0.5 %    Sig: Apply 1 application topically 2 (two) times daily.    Dispense:   30 g    Refill:  0    Order Specific Question:   Supervising Provider    Answer:   Beatrice Lecher D [[id number]]    Follow-up: Return in about 6 months (around 01/04/2022) for CPE and FBW.    Lorrene Reid, PA-C

## 2021-07-07 NOTE — Patient Instructions (Signed)
Rash, Adult  A rash is a change in the color of your skin. A rash can also change the way your skin feels. There are many different conditions and factors that can causea rash. Follow these instructions at home: The goal of treatment is to stop the itching and keep the rash from spreading. Watch for any changes in your symptoms. Let your doctor know about them. Followthese instructions to help with your condition: Medicine Take or apply over-the-counter and prescription medicines only as told by your doctor. These may include medicines: To treat red or swollen skin (corticosteroid creams). To treat itching. To treat an allergy (oral antihistamines). To treat very bad symptoms (oral corticosteroids).  Skin care Put cool cloths (compresses) on the affected areas. Do not scratch or rub your skin. Avoid covering the rash. Make sure that the rash is exposed to air as much as possible. Managing itching and discomfort Avoid hot showers or baths. These can make itching worse. A cold shower may help. Try taking a bath with: Epsom salts. You can get these at your local pharmacy or grocery store. Follow the instructions on the package. Baking soda. Pour a small amount into the bath as told by your doctor. Colloidal oatmeal. You can get this at your local pharmacy or grocery store. Follow the instructions on the package. Try putting baking soda paste onto your skin. Stir water into baking soda until it gets like a paste. Try putting on a lotion that relieves itchiness (calamine lotion). Keep cool and out of the sun. Sweating and being hot can make itching worse. General instructions  Rest as needed. Drink enough fluid to keep your pee (urine) pale yellow. Wear loose-fitting clothing. Avoid scented soaps, detergents, and perfumes. Use gentle soaps, detergents, perfumes, and other cosmetic products. Avoid anything that causes your rash. Keep a journal to help track what causes your rash. Write  down: What you eat. What cosmetic products you use. What you drink. What you wear. This includes jewelry. Keep all follow-up visits as told by your doctor. This is important.  Contact a doctor if: You sweat at night. You lose weight. You pee (urinate) more than normal. You pee less than normal, or you notice that your pee is a darker color than normal. You feel weak. You throw up (vomit). Your skin or the whites of your eyes look yellow (jaundice). Your skin: Tingles. Is numb. Your rash: Does not go away after a few days. Gets worse. You are: More thirsty than normal. More tired than normal. You have: New symptoms. Pain in your belly (abdomen). A fever. Watery poop (diarrhea). Get help right away if: You have a fever and your symptoms suddenly get worse. You start to feel mixed up (confused). You have a very bad headache or a stiff neck. You have very bad joint pains or stiffness. You have jerky movements that you cannot control (seizure). Your rash covers all or most of your body. The rash may or may not be painful. You have blisters that: Are on top of the rash. Grow larger. Grow together. Are painful. Are inside your nose or mouth. You have a rash that: Looks like purple pinprick-sized spots all over your body. Has a "bull's eye" or looks like a target. Is red and painful, causes your skin to peel, and is not from being in the sun too long. Summary A rash is a change in the color of your skin. A rash can also change the way your skin feels.   The goal of treatment is to stop the itching and keep the rash from spreading. Take or apply over-the-counter and prescription medicines only as told by your doctor. Contact a doctor if you have new symptoms or symptoms that get worse. Keep all follow-up visits as told by your doctor. This is important. This information is not intended to replace advice given to you by your health care provider. Make sure you discuss any  questions you have with your healthcare provider. Document Revised: 09/16/2018 Document Reviewed: 12/27/2017 Elsevier Patient Education  2022 Elsevier Inc.  

## 2021-07-08 LAB — COMPREHENSIVE METABOLIC PANEL
ALT: 21 IU/L (ref 0–32)
AST: 34 IU/L (ref 0–40)
Albumin/Globulin Ratio: 1.8 (ref 1.2–2.2)
Albumin: 4.7 g/dL (ref 3.8–4.8)
Alkaline Phosphatase: 121 IU/L (ref 44–121)
BUN/Creatinine Ratio: 24 (ref 12–28)
BUN: 17 mg/dL (ref 8–27)
Bilirubin Total: 0.4 mg/dL (ref 0.0–1.2)
CO2: 24 mmol/L (ref 20–29)
Calcium: 9.5 mg/dL (ref 8.7–10.3)
Chloride: 102 mmol/L (ref 96–106)
Creatinine, Ser: 0.7 mg/dL (ref 0.57–1.00)
Globulin, Total: 2.6 g/dL (ref 1.5–4.5)
Glucose: 115 mg/dL — ABNORMAL HIGH (ref 70–99)
Potassium: 4.2 mmol/L (ref 3.5–5.2)
Sodium: 141 mmol/L (ref 134–144)
Total Protein: 7.3 g/dL (ref 6.0–8.5)
eGFR: 96 mL/min/{1.73_m2} (ref >59)

## 2021-07-08 LAB — CBC WITH DIFFERENTIAL/PLATELET
Basophils Absolute: 0.1 10*3/uL (ref 0.0–0.2)
Basos: 1 %
EOS (ABSOLUTE): 0.2 10*3/uL (ref 0.0–0.4)
Eos: 3 %
Hematocrit: 43.3 % (ref 34.0–46.6)
Hemoglobin: 15.1 g/dL (ref 11.1–15.9)
Immature Grans (Abs): 0 10*3/uL (ref 0.0–0.1)
Immature Granulocytes: 0 %
Lymphocytes Absolute: 1.8 10*3/uL (ref 0.7–3.1)
Lymphs: 23 %
MCH: 32.3 pg (ref 26.6–33.0)
MCHC: 34.9 g/dL (ref 31.5–35.7)
MCV: 93 fL (ref 79–97)
Monocytes Absolute: 0.4 10*3/uL (ref 0.1–0.9)
Monocytes: 6 %
Neutrophils Absolute: 5.1 10*3/uL (ref 1.4–7.0)
Neutrophils: 67 %
Platelets: 239 10*3/uL (ref 150–450)
RBC: 4.68 x10E6/uL (ref 3.77–5.28)
RDW: 12.6 % (ref 11.7–15.4)
WBC: 7.6 10*3/uL (ref 3.4–10.8)

## 2021-07-08 LAB — LIPID PANEL
Chol/HDL Ratio: 5.8 ratio — ABNORMAL HIGH (ref 0.0–4.4)
Cholesterol, Total: 233 mg/dL — ABNORMAL HIGH (ref 100–199)
HDL: 40 mg/dL (ref >39)
LDL Chol Calc (NIH): 136 mg/dL — ABNORMAL HIGH (ref 0–99)
Triglycerides: 315 mg/dL — ABNORMAL HIGH (ref 0–149)
VLDL Cholesterol Cal: 57 mg/dL — ABNORMAL HIGH (ref 5–40)

## 2021-07-09 ENCOUNTER — Other Ambulatory Visit: Payer: Self-pay | Admitting: Physician Assistant

## 2021-07-09 DIAGNOSIS — Z981 Arthrodesis status: Secondary | ICD-10-CM

## 2021-07-16 ENCOUNTER — Other Ambulatory Visit: Payer: Self-pay | Admitting: Physician Assistant

## 2021-07-16 DIAGNOSIS — I10 Essential (primary) hypertension: Secondary | ICD-10-CM

## 2021-10-08 ENCOUNTER — Other Ambulatory Visit: Payer: Self-pay | Admitting: Physician Assistant

## 2021-10-08 DIAGNOSIS — Z1231 Encounter for screening mammogram for malignant neoplasm of breast: Secondary | ICD-10-CM

## 2021-11-21 ENCOUNTER — Ambulatory Visit
Admission: RE | Admit: 2021-11-21 | Discharge: 2021-11-21 | Disposition: A | Discharge status: Home / Self Care | Payer: Medicare Other | Source: Ambulatory Visit | Attending: Physician Assistant | Admitting: Physician Assistant

## 2021-11-21 DIAGNOSIS — Z1231 Encounter for screening mammogram for malignant neoplasm of breast: Secondary | ICD-10-CM

## 2021-11-24 ENCOUNTER — Emergency Department
Admission: EM | Admit: 2021-11-24 | Discharge: 2021-11-24 | Disposition: A | Discharge status: Home / Self Care | Payer: Medicare Other | Attending: Emergency Medicine | Admitting: Emergency Medicine

## 2021-11-24 ENCOUNTER — Encounter: Payer: Self-pay | Admitting: Emergency Medicine

## 2021-11-24 ENCOUNTER — Emergency Department: Payer: Medicare Other

## 2021-11-24 ENCOUNTER — Other Ambulatory Visit: Payer: Self-pay

## 2021-11-24 DIAGNOSIS — M545 Low back pain, unspecified: Secondary | ICD-10-CM | POA: Insufficient documentation

## 2021-11-24 DIAGNOSIS — G8929 Other chronic pain: Secondary | ICD-10-CM | POA: Insufficient documentation

## 2021-11-24 DIAGNOSIS — M25552 Pain in left hip: Secondary | ICD-10-CM | POA: Insufficient documentation

## 2021-11-24 DIAGNOSIS — I10 Essential (primary) hypertension: Secondary | ICD-10-CM | POA: Insufficient documentation

## 2021-11-24 MED ORDER — LIDOCAINE 5 % EX PTCH
1.0000 | MEDICATED_PATCH | CUTANEOUS | Status: DC
  Administered 2021-11-24: 1 via TRANSDERMAL
  Filled 2021-11-24: qty 1

## 2021-11-24 MED ORDER — LIDOCAINE 5 % EX PTCH: 1.0000 | MEDICATED_PATCH | Freq: Two times a day (BID) | CUTANEOUS | 0 refills | Status: DC

## 2021-11-24 MED ORDER — HYDROCODONE-ACETAMINOPHEN 5-325 MG PO TABS: 1.0000 | ORAL_TABLET | Freq: Four times a day (QID) | ORAL | 0 refills | Status: DC | PRN

## 2021-11-24 MED ORDER — PREDNISONE 10 MG PO TABS: ORAL_TABLET | ORAL | 0 refills | Status: DC

## 2021-11-24 NOTE — ED Triage Notes (Signed)
Left hip pain.  For several days.  Went to dr and they did injections.  Still hurting and cant sleep due to pain

## 2021-11-24 NOTE — Discharge Instructions (Addendum)
Keep your appointment with [facility] on 12/03/2021.  Begin taking prednisone as directed.  You may use ice or heat to your back and hip as needed for discomfort.  A prescription for pain medication (hydrocodone-acetaminophen) was sent to the pharmacy to help with severe pain.  Also a prescription for Lidoderm patches to applied to the painful area every 12 hours as needed for pain control.

## 2021-11-24 NOTE — ED Provider Notes (Signed)
[facility] Provider Note    Event Date/Time   First MD Initiated Contact with Patient 11/24/21 [phone]     (approximate)   History   Hip Pain   HPI  Misty Decker is a 66 y.o. female presents to the ED with complaint of left hip pain without history of recent injury.  Patient states that she has a history of chronic back pain and has had steroid injections in her back which did not help with her hip.  She has an appointment with [place] [facility] on 12/03/2021 for reevaluation of her back and also to decide whether or not she needs to see an orthopedist for her hip.  Patient states that the pain is preventing her from sleeping at night.  Patient has history of sciatica, chronic low back pain, hypertension, fibromyalgia, depression and vitamin D deficiency.     Physical Exam   Triage Vital Signs: ED Triage Vitals  Enc Vitals Group     BP 11/24/21 0738 (!) 158/76     Pulse Rate 11/24/21 0738 90     Resp 11/24/21 0738 16     Temp 11/24/21 0738 98.7 F (37.1 C)     Temp Source 11/24/21 0738 Oral     SpO2 11/24/21 0738 93 %     Weight 11/24/21 0739 183 lb (83 kg)     Height 11/24/21 0739 5\' 3"  (1.6 m)     Head Circumference --      Peak Flow --      Pain Score --      Pain Loc --      Pain Edu? --      Excl. in [place]? --     Most recent vital signs: Vitals:   11/24/21 0738  BP: (!) 158/76  Pulse: 90  Resp: 16  Temp: 98.7 F (37.1 C)  SpO2: 93%     General: Awake, no distress.  CV:  Good peripheral perfusion.  Resp:  Normal effort.  Abd:  No distention.  Other:  Examination of the back there is no evidence of recent injury.  There is generalized tenderness on palpation of the left SI joint and surrounding muscle tissue.  There is also tenderness on palpation of the left lateral hip.  Patient is able to stand and ambulate without any assistance.  Skin is intact.  No discoloration noted.   ED Results / Procedures / Treatments    Labs (all labs ordered are listed, but only abnormal results are displayed) Labs Reviewed - No data to display    RADIOLOGY  Left hip x-ray images were reviewed and interpreted by myself independently of the radiologist is negative for acute bony injury.  Official radiology report is no significant osteoarthritis left hip.   PROCEDURES:  Critical Care performed:   Procedures   MEDICATIONS ORDERED IN ED: Medications  lidocaine (LIDODERM) 5 % 1 patch (1 patch Transdermal Patch Applied 11/24/21 1029)     IMPRESSION / MDM / ASSESSMENT AND PLAN / ED COURSE  I reviewed the triage vital signs and the nursing notes.   Differential diagnosis includes, but is not limited to, acute exacerbation of chronic low back pain with left sciatica, left hip pain, left hip bursitis, osteoarthritis.  66 year old female presents to the ED with history of left hip pain along with chronic low back pain that has been going on greater than 3 months.  Patient denies any recent injury.  Physical exam had low suspicion for  acute left hip injury.  X-rays are negative for acute bony injury.  Patient was made aware that there possibly could be 2 things going on at this time with the chronic low back pain and left leg radiculopathy.  Patient has an appointment with [place] [facility] on 12/03/2021 that she is strongly encouraged to keep.  We will start on prednisone for the next 7 days, a Lidoderm patch was applied and a prescription for more and hydrocodone every 6 hours if needed for moderate to severe pain.  Is aware that she cannot drive or operate machinery while taking this medication.      Patient's presentation is most consistent with acute complicated illness / injury requiring diagnostic workup.  FINAL CLINICAL IMPRESSION(S) / ED DIAGNOSES   Final diagnoses:  Left hip pain  Chronic back pain greater than 3 months duration     Rx / DC Orders   ED Discharge Orders          Ordered     predniSONE (DELTASONE) 10 MG tablet        11/24/21 1018    HYDROcodone-acetaminophen (NORCO/VICODIN) 5-325 MG tablet  Every 6 hours PRN        11/24/21 1033    lidocaine (LIDODERM) 5 %  Every 12 hours        11/24/21 1033             Note:  This document was prepared using Dragon voice recognition software and may include unintentional dictation errors.   Johnn Hai, PA-C 11/24/21 1043    Lavonia Drafts, MD 11/24/21 1058

## 2021-11-24 NOTE — ED Notes (Signed)
Dc ppw provided to patient. quetions, followup and rx information reviewed. Pt provides verbal consent for dc at this time and is alert and oriented x4. ambulatory to lobby.

## 2021-12-03 ENCOUNTER — Other Ambulatory Visit: Payer: Self-pay | Admitting: Neurological Surgery

## 2021-12-03 DIAGNOSIS — M5416 Radiculopathy, lumbar region: Secondary | ICD-10-CM

## 2021-12-04 ENCOUNTER — Other Ambulatory Visit: Payer: Self-pay | Admitting: Physician Assistant

## 2021-12-17 ENCOUNTER — Ambulatory Visit
Admission: RE | Admit: 2021-12-17 | Discharge: 2021-12-17 | Disposition: A | Discharge status: Home / Self Care | Payer: Medicare Other | Source: Ambulatory Visit | Attending: Neurological Surgery | Admitting: Neurological Surgery

## 2021-12-17 DIAGNOSIS — M5416 Radiculopathy, lumbar region: Secondary | ICD-10-CM

## 2021-12-22 ENCOUNTER — Telehealth: Payer: Self-pay | Admitting: Physician Assistant

## 2021-12-22 NOTE — Telephone Encounter (Signed)
Patient called and was inquiring if Herb Grays had heard anything from Dr. Zada Finders and wanted a call back regarding this. AMUCK

## 2021-12-22 NOTE — Telephone Encounter (Signed)
To my knowledge we have not. Please reach out to the patient to advise. AS< CMA

## 2021-12-22 NOTE — Telephone Encounter (Signed)
Reached back out to patient and she stated there should be a report (MRI?) results that she should see in her chart.

## 2021-12-25 ENCOUNTER — Other Ambulatory Visit: Payer: Self-pay | Admitting: Radiation Therapy

## 2021-12-25 DIAGNOSIS — C7951 Secondary malignant neoplasm of bone: Secondary | ICD-10-CM

## 2021-12-26 ENCOUNTER — Other Ambulatory Visit: Payer: Self-pay | Admitting: Physician Assistant

## 2021-12-26 ENCOUNTER — Ambulatory Visit
Admission: RE | Admit: 2021-12-26 | Discharge: 2021-12-26 | Disposition: A | Discharge status: Home / Self Care | Payer: Medicare Other | Source: Ambulatory Visit | Attending: Neurological Surgery | Admitting: Neurological Surgery

## 2021-12-26 ENCOUNTER — Telehealth: Payer: Self-pay | Admitting: Radiation Therapy

## 2021-12-26 ENCOUNTER — Other Ambulatory Visit: Payer: Self-pay | Admitting: Radiation Therapy

## 2021-12-26 DIAGNOSIS — C7951 Secondary malignant neoplasm of bone: Secondary | ICD-10-CM

## 2021-12-26 DIAGNOSIS — R918 Other nonspecific abnormal finding of lung field: Secondary | ICD-10-CM

## 2021-12-26 DIAGNOSIS — E782 Mixed hyperlipidemia: Secondary | ICD-10-CM

## 2021-12-26 MED ORDER — IOPAMIDOL (ISOVUE-300) INJECTION 61%: 100.0000 mL | Freq: Once | INTRAVENOUS | Status: DC | PRN

## 2021-12-26 MED ORDER — IOPAMIDOL (ISOVUE-300) INJECTION 61%
100.0000 mL | Freq: Once | INTRAVENOUS | Status: AC | PRN
  Administered 2021-12-26: 100 mL via INTRAVENOUS

## 2021-12-26 NOTE — Telephone Encounter (Addendum)
I called Misty Decker regarding her recent chest CT images. I shared that there is an enhancing area on her Lt. Lung that is concerning for malignancy. We have an appointment scheduled for her to see Dr. Valeta Harms on Tuesday 7/25 to discuss a biopsy procedure for diagnosis. [name] was not surprised to hear there was something there based on the disease seen on her spine MRI, but is anxious to "find out what it is". She has my contact information for any other questions. I told her that I will be reaching back out to her Monday after our Multiple Disciplinary Brain and Spine Conference regarding discussion of her case.   Mont Dutton R.T.(R)(T) Radiation Special Procedures Navigator

## 2021-12-29 ENCOUNTER — Inpatient Hospital Stay: Payer: Medicare Other | Attending: Neurological Surgery

## 2021-12-30 ENCOUNTER — Telehealth: Payer: Self-pay | Admitting: Pulmonary Disease

## 2021-12-30 ENCOUNTER — Encounter: Payer: Self-pay | Admitting: Pulmonary Disease

## 2021-12-30 ENCOUNTER — Telehealth: Payer: Self-pay | Admitting: Internal Medicine

## 2021-12-30 ENCOUNTER — Telehealth: Payer: Self-pay | Admitting: Radiation Therapy

## 2021-12-30 ENCOUNTER — Ambulatory Visit (INDEPENDENT_AMBULATORY_CARE_PROVIDER_SITE_OTHER): Payer: Medicare Other | Admitting: Pulmonary Disease

## 2021-12-30 ENCOUNTER — Institutional Professional Consult (permissible substitution): Payer: Medicare Other | Admitting: Pulmonary Disease

## 2021-12-30 VITALS — BP 126/68 | HR 61 | Temp 98.4°F | Ht 63.0 in

## 2021-12-30 DIAGNOSIS — R918 Other nonspecific abnormal finding of lung field: Secondary | ICD-10-CM

## 2021-12-30 DIAGNOSIS — C3490 Malignant neoplasm of unspecified part of unspecified bronchus or lung: Secondary | ICD-10-CM | POA: Diagnosis not present

## 2021-12-30 DIAGNOSIS — C7951 Secondary malignant neoplasm of bone: Secondary | ICD-10-CM

## 2021-12-30 NOTE — Telephone Encounter (Signed)
Scheduled appt per 7/21 referral. Pt is aware of appt date and time. Pt is aware to arrive 15 mins prior to appt time and to bring and updated insurance card. Pt is aware of appt location.

## 2021-12-30 NOTE — Telephone Encounter (Signed)
I received a call from Mrs. Killilea today requesting something to help with the severe pain she has in her back, pelvis and both hips. She is currently only taking 6 Tylenol throughout the day and this is not providing any relief. I left a voicemail for Dr. Zada Finders 's secretary, Junie Panning, requesting a call to the patient. I also sent a message to Dr. Zada Finders asking if he would like a palliative care referral to be entered to help with pain management for Ms. Weekes.  Mont Dutton R.T.(R)(T) Radiation Special Procedures Navigator

## 2021-12-30 NOTE — Progress Notes (Signed)
Synopsis: Referred in July 2023 for abnormal CT imaging by Lorrene Reid, PA-C  Subjective:   PATIENT ID: Misty Decker GENDER: female DOB: March 18, 1956, MRN: [id number]  Chief Complaint  Patient presents with   Consult    Pt is here for consult for lung mass. Pt states that she has no issues or complaints with her breathing right now. Ct scan done last week     This is 66 year old female, past medical history of hypertension, hyperlipidemia.  Patient has a family history of mother with cancer, mother with breast cancer, mother with hyperlipidemia father with hypertension.Patient had CT imaging of the chest abdomen and pelvis completed by neurosurgery.  After having a lumbar MRI which had concerning imaging for malignancy of the spine.  Patient had a CT scan of the chest which showed irregular bronchial wall thickening of the left with continuation into a left lower lobe mass measuring greater than 5 cm there is also additional spiculated subsolid groundglass lesions in the lungs concerning for metastasis.  Patient had diffuse lytic and sclerotic bone lesions concerning for advanced age bronchogenic carcinoma.  Patient was referred here to discuss considerations for tissue biopsy to help expedite diagnosis.      Past Medical History:  Diagnosis Date   Complication of anesthesia    "Hard to sedate", per pt   Degenerative joint disease    s/p cervical and lumbar fusions   Depression    Encounter for preventive health examination    Next mammogram in 07/2009. Next colonoscopy in 09/2017. DEXA 4/09>Normal   Encounter for screening colonoscopy 10/03/2007   Normal   Fibromyalgia    Hemorrhoid    hx of   Hyperlipidemia    Hypertension    Sciatica      Family History  Problem Relation Age of Onset   Cancer Mother    Diabetes Mother    Breast cancer Mother    Heart attack Mother    Hyperlipidemia Mother    Hyperlipidemia Father    Hypertension Father    Aneurysm Father     Dementia Maternal Grandfather    Cancer Paternal Grandmother        intestinal     Past Surgical History:  Procedure Laterality Date   ABDOMINAL HYSTERECTOMY N/A    [facility] 11/21/2019   APPENDECTOMY N/A    [facility] 11/21/2019   CERVICAL FUSION  2010   X 2   LUMBAR FUSION  1997   X 2/has plate and screws and cage   OVARIAN CYST SURGERY  1980   pelvic prolapse  1987   RECTAL PROLAPSE REPAIR     SPINE SURGERY N/A    [facility] 11/21/2019   TONSILLECTOMY     and adenoids   TOTAL ABDOMINAL HYSTERECTOMY  1987    Social History   Socioeconomic History   Marital status: Married    Spouse name: Not on file   Number of children: Not on file   Years of education: Not on file   Highest education level: Not on file  Occupational History   Not on file  Tobacco Use   Smoking status: Former    Packs/day: 2.00    Years: 30.00    Total pack years: 60.00    Types: Cigarettes    Quit date: 04/03/2003    Years since quitting: 18.7   Smokeless tobacco: Never  Vaping Use   Vaping Use: Never used  Substance and Sexual Activity   Alcohol use: Not Currently  Alcohol/week: 0.0 standard drinks of alcohol   Drug use: No   Sexual activity: Not Currently  Other Topics Concern   Not on file  Social History Narrative   Not on file   Social Determinants of Health   Financial Resource Strain: Medium Risk (03/02/2021)   Overall Financial Resource Strain (CARDIA)    Difficulty of Paying Living Expenses: Somewhat hard  Food Insecurity: No Food Insecurity (03/02/2021)   Hunger Vital Sign    Worried About Running Out of Food in the Last Year: Never true    Ran Out of Food in the Last Year: Never true  Transportation Needs: No Transportation Needs (03/02/2021)   PRAPARE - Hydrologist (Medical): No    Lack of Transportation (Non-Medical): No  Physical Activity: Inactive (03/02/2021)   Exercise Vital Sign    Days of Exercise per Week: 0 days    Minutes of  Exercise per Session: 0 min  Stress: Stress Concern Present (03/02/2021)   [place]    Feeling of Stress : To some extent  Social Connections: Moderately Integrated (03/02/2021)   Social Connection and Isolation Panel [NHANES]    Frequency of Communication with Friends and Family: More than three times a week    Frequency of Social Gatherings with Friends and Family: More than three times a week    Attends Religious Services: More than 4 times per year    Active Member of [employer] or Organizations: No    Attends Archivist Meetings: Never    Marital Status: Married  Human resources officer Violence: Not At Risk (03/02/2021)   Humiliation, Afraid, Rape, and Kick questionnaire    Fear of Current or Ex-Partner: No    Emotionally Abused: No    Physically Abused: No    Sexually Abused: No     Allergies  Allergen Reactions   Codeine     Nausea and vomiting, hallucinations   Morphine     B/P dropped!   Morphine And Related Shortness Of Breath   Other Itching and Diarrhea   Beef-Derived Products Nausea And Vomiting   Doxycycline Nausea And Vomiting   Ibuprofen     Caused RLS   Iodinated Contrast Media Nausea And Vomiting    Pt reports n/v only. No other reaction noted. No need for 13 hr prep.    Latex     REACTION: rash   Nsaids Other (See Comments)    PT has hx of ulcers.   Penicillins     hives   Pork-Derived Products Nausea And Vomiting   Tramadol     Caused RLS     Outpatient Medications Prior to Visit  Medication Sig Dispense Refill   Calcium Carbonate-Vitamin D (CALCIUM-VITAMIN D) 600-125 MG-UNIT TABS Take 2 tablets by mouth 2 (two) times daily.     ezetimibe (ZETIA) 10 MG tablet TAKE 1 TABLET (10 MG TOTAL) BY MOUTH DAILY. 90 tablet 0   hydrochlorothiazide (HYDRODIURIL) 12.5 MG tablet TAKE 1 TABLET (12.5 MG TOTAL) BY MOUTH DAILY. 90 tablet 1   HYDROcodone-acetaminophen (NORCO/VICODIN) 5-325 MG tablet Take  1 tablet by mouth every 6 (six) hours as needed for moderate pain. 12 tablet 0   Multiple Vitamin (MULTIVITAMIN) capsule Take 1 capsule by mouth daily.     Probiotic Product (PROBIOTIC DAILY PO) Take 1 tablet by mouth daily.     psyllium (METAMUCIL) 58.6 % powder Take 1 packet by mouth. Take 1 tsp daily  TURMERIC PO Take 1 tablet by mouth daily.     Cinnamon 500 MG capsule Take 1,000 mg by mouth daily.     Glucosamine-Chondroit-Vit C-Mn (GLUCOSAMINE 1500 COMPLEX PO) Take 1 tablet by mouth.     lidocaine (LIDODERM) 5 % Place 1 patch onto the skin every 12 (twelve) hours. Remove & Discard patch within 12 hours or as directed by MD 10 patch 0   predniSONE (DELTASONE) 10 MG tablet Take 6 tablets  today, on day 2 take 5 tablets, day 3 take 4 tablets, day 4 take 3 tablets, day 5 take  2 tablets and 1 tablet the last day 21 tablet 0   triamcinolone ointment (KENALOG) 0.5 % Apply 1 application topically 2 (two) times daily. 30 g 0   No facility-administered medications prior to visit.    Review of Systems  Constitutional:  Positive for malaise/fatigue and weight loss. Negative for chills and fever.  HENT:  Negative for hearing loss, sore throat and tinnitus.   Eyes:  Negative for blurred vision and double vision.  Respiratory:  Negative for cough, hemoptysis, sputum production, shortness of breath, wheezing and stridor.   Cardiovascular:  Negative for chest pain, palpitations, orthopnea, leg swelling and PND.  Gastrointestinal:  Negative for abdominal pain, constipation, diarrhea, heartburn, nausea and vomiting.  Genitourinary:  Negative for dysuria, hematuria and urgency.  Musculoskeletal:  Positive for back pain. Negative for joint pain and myalgias.  Skin:  Negative for itching and rash.  Neurological:  Negative for dizziness, tingling, weakness and headaches.  Endo/Heme/Allergies:  Negative for environmental allergies. Does not bruise/bleed easily.  Psychiatric/Behavioral:  Negative for  depression. The patient is not nervous/anxious and does not have insomnia.   All other systems reviewed and are negative.    Objective:  Physical Exam Vitals reviewed.  Constitutional:      General: She is not in acute distress.    Appearance: She is well-developed.  HENT:     Head: Normocephalic and atraumatic.  Eyes:     General: No scleral icterus.    Conjunctiva/sclera: Conjunctivae normal.     Pupils: Pupils are equal, round, and reactive to light.  Neck:     Vascular: No JVD.     Trachea: No tracheal deviation.  Cardiovascular:     Rate and Rhythm: Normal rate and regular rhythm.     Heart sounds: Normal heart sounds. No murmur heard. Pulmonary:     Effort: Pulmonary effort is normal. No tachypnea, accessory muscle usage or respiratory distress.     Breath sounds: No stridor. No wheezing, rhonchi or rales.  Abdominal:     General: There is no distension.     Palpations: Abdomen is soft.     Tenderness: There is no abdominal tenderness.  Musculoskeletal:        General: No tenderness.     Cervical back: Neck supple.  Lymphadenopathy:     Cervical: No cervical adenopathy.  Skin:    General: Skin is warm and dry.     Capillary Refill: Capillary refill takes less than 2 seconds.     Findings: No rash.  Neurological:     Mental Status: She is alert and oriented to person, place, and time.  Psychiatric:        Behavior: Behavior normal.      Vitals:   12/30/21 1328  BP: 126/68  Pulse: 61  Temp: 98.4 F (36.9 C)  TempSrc: Oral  SpO2: 94%  Height: 5\' 3"  (1.6 m)   94%  on RA BMI Readings from Last 3 Encounters:  12/30/21 32.42 kg/m  11/24/21 32.42 kg/m  07/07/21 34.90 kg/m   Wt Readings from Last 3 Encounters:  11/24/21 183 lb (83 kg)  07/07/21 197 lb (89.4 kg)  05/13/21 190 lb (86.2 kg)     CBC    Component Value Date/Time   WBC 7.6 07/07/2021 1125   WBC 10.3 02/08/2010 2145   RBC 4.68 07/07/2021 1125   RBC 4.63 02/08/2010 2145   HGB 15.1  07/07/2021 1125   HCT 43.3 07/07/2021 1125   PLT 239 07/07/2021 1125   MCV 93 07/07/2021 1125   MCH 32.3 07/07/2021 1125   MCH 33.3 02/08/2010 2145   MCHC 34.9 07/07/2021 1125   MCHC 34.1 02/08/2010 2145   RDW 12.6 07/07/2021 1125   LYMPHSABS 1.8 07/07/2021 1125   MONOABS 0.7 02/08/2010 2145   EOSABS 0.2 07/07/2021 1125   BASOSABS 0.1 07/07/2021 1125     Chest Imaging: July 2023 CT imaging: Left upper lobe mass with associated adenopathy, diffuse bone lesions concerning for advanced age bronchogenic carcinoma. The patient's images have been independently reviewed by me.    Pulmonary Functions Testing Results:     No data to display          FeNO:   Pathology:   Echocardiogram:   Heart Catheterization:     Assessment & Plan:     ICD-10-CM   1. Mass of left lung  R91.8     2. Abnormal CT scan of lung  R91.8     3. Metastasis to bone [facility])  C79.51       Discussion:  This is a 66 year old female with a new diagnosis of left lung mass abnormal CT imaging and concerning for bone metastasis with advanced age bronchogenic carcinoma.  Patient was referred for consideration for tissue biopsy.  Plan: Today in the office we discussed risk benefits alternatives proceed with bronchoscopy. Patient is agreeable to proceed.  We discussed the risk of bleeding and pneumothorax. Patient is agreeable to proceed with the bronchoscopy for next week Tentative plan for January 06, 2022.  She will need a brain MRI as well as a nuclear medicine PET scan to finish staging. We will also send a referral to medical oncology.    Current Outpatient Medications:    Calcium Carbonate-Vitamin D (CALCIUM-VITAMIN D) 600-125 MG-UNIT TABS, Take 2 tablets by mouth 2 (two) times daily., Disp: , Rfl:    ezetimibe (ZETIA) 10 MG tablet, TAKE 1 TABLET (10 MG TOTAL) BY MOUTH DAILY., Disp: 90 tablet, Rfl: 0   hydrochlorothiazide (HYDRODIURIL) 12.5 MG tablet, TAKE 1 TABLET (12.5 MG TOTAL) BY MOUTH  DAILY., Disp: 90 tablet, Rfl: 1   HYDROcodone-acetaminophen (NORCO/VICODIN) 5-325 MG tablet, Take 1 tablet by mouth every 6 (six) hours as needed for moderate pain., Disp: 12 tablet, Rfl: 0   Multiple Vitamin (MULTIVITAMIN) capsule, Take 1 capsule by mouth daily., Disp: , Rfl:    Probiotic Product (PROBIOTIC DAILY PO), Take 1 tablet by mouth daily., Disp: , Rfl:    psyllium (METAMUCIL) 58.6 % powder, Take 1 packet by mouth. Take 1 tsp daily, Disp: , Rfl:    TURMERIC PO, Take 1 tablet by mouth daily., Disp: , Rfl:    I spent 62 minutes dedicated to the care of this patient on the date of this encounter to include pre-visit review of records, face-to-face time with the patient discussing conditions above, post visit ordering of testing, clinical documentation with the electronic health record,  making appropriate referrals as documented, and communicating necessary findings to members of the patients care team.   Garner Nash, DO [facility] 12/30/2021 1:32 PM

## 2021-12-30 NOTE — Patient Instructions (Signed)
Thank you for visiting Dr. Valeta Harms at [facility]. Today we recommend the following: Orders Placed This Encounter  Procedures   Procedural/ Surgical Case Request: VIDEO BRONCHOSCOPY WITH ENDOBRONCHIAL ULTRASOUND   MR BRAIN W WO CONTRAST   NM PET Image Initial (PI) Skull Base To Thigh (F-18 FDG)   Ambulatory referral to Pulmonology   Ambulatory referral to Hematology / Oncology   Bronchoscopy August 1st  Return in about 2 weeks (around 01/13/2022) for w/ Eric Form, NP .    Please do your part to reduce the spread of COVID-19.

## 2021-12-30 NOTE — H&P (View-Only) (Signed)
Synopsis: Referred in July 2023 for abnormal CT imaging by Lorrene Reid, PA-C  Subjective:   PATIENT ID: Misty Decker GENDER: female DOB: July 29, 1955, MRN: [id number]  Chief Complaint  Patient presents with   Consult    Pt is here for consult for lung mass. Pt states that she has no issues or complaints with her breathing right now. Ct scan done last week     This is 66 year old female, past medical history of hypertension, hyperlipidemia.  Patient has a family history of mother with cancer, mother with breast cancer, mother with hyperlipidemia father with hypertension.Patient had CT imaging of the chest abdomen and pelvis completed by neurosurgery.  After having a lumbar MRI which had concerning imaging for malignancy of the spine.  Patient had a CT scan of the chest which showed irregular bronchial wall thickening of the left with continuation into a left lower lobe mass measuring greater than 5 cm there is also additional spiculated subsolid groundglass lesions in the lungs concerning for metastasis.  Patient had diffuse lytic and sclerotic bone lesions concerning for advanced age bronchogenic carcinoma.  Patient was referred here to discuss considerations for tissue biopsy to help expedite diagnosis.      Past Medical History:  Diagnosis Date   Complication of anesthesia    "Hard to sedate", per pt   Degenerative joint disease    s/p cervical and lumbar fusions   Depression    Encounter for preventive health examination    Next mammogram in 07/2009. Next colonoscopy in 09/2017. DEXA 4/09>Normal   Encounter for screening colonoscopy 10/03/2007   Normal   Fibromyalgia    Hemorrhoid    hx of   Hyperlipidemia    Hypertension    Sciatica      Family History  Problem Relation Age of Onset   Cancer Mother    Diabetes Mother    Breast cancer Mother    Heart attack Mother    Hyperlipidemia Mother    Hyperlipidemia Father    Hypertension Father    Aneurysm Father     Dementia Maternal Grandfather    Cancer Paternal Grandmother        intestinal     Past Surgical History:  Procedure Laterality Date   ABDOMINAL HYSTERECTOMY N/A    [facility] 11/21/2019   APPENDECTOMY N/A    [facility] 11/21/2019   CERVICAL FUSION  2010   X 2   LUMBAR FUSION  1997   X 2/has plate and screws and cage   OVARIAN CYST SURGERY  1980   pelvic prolapse  1987   RECTAL PROLAPSE REPAIR     SPINE SURGERY N/A    [facility] 11/21/2019   TONSILLECTOMY     and adenoids   TOTAL ABDOMINAL HYSTERECTOMY  1987    Social History   Socioeconomic History   Marital status: Married    Spouse name: Not on file   Number of children: Not on file   Years of education: Not on file   Highest education level: Not on file  Occupational History   Not on file  Tobacco Use   Smoking status: Former    Packs/day: 2.00    Years: 30.00    Total pack years: 60.00    Types: Cigarettes    Quit date: 04/03/2003    Years since quitting: 18.7   Smokeless tobacco: Never  Vaping Use   Vaping Use: Never used  Substance and Sexual Activity   Alcohol use: Not Currently  Alcohol/week: 0.0 standard drinks of alcohol   Drug use: No   Sexual activity: Not Currently  Other Topics Concern   Not on file  Social History Narrative   Not on file   Social Determinants of Health   Financial Resource Strain: Medium Risk (03/02/2021)   Overall Financial Resource Strain (CARDIA)    Difficulty of Paying Living Expenses: Somewhat hard  Food Insecurity: No Food Insecurity (03/02/2021)   Hunger Vital Sign    Worried About Running Out of Food in the Last Year: Never true    Ran Out of Food in the Last Year: Never true  Transportation Needs: No Transportation Needs (03/02/2021)   PRAPARE - Hydrologist (Medical): No    Lack of Transportation (Non-Medical): No  Physical Activity: Inactive (03/02/2021)   Exercise Vital Sign    Days of Exercise per Week: 0 days    Minutes of  Exercise per Session: 0 min  Stress: Stress Concern Present (03/02/2021)   [place]    Feeling of Stress : To some extent  Social Connections: Moderately Integrated (03/02/2021)   Social Connection and Isolation Panel [NHANES]    Frequency of Communication with Friends and Family: More than three times a week    Frequency of Social Gatherings with Friends and Family: More than three times a week    Attends Religious Services: More than 4 times per year    Active Member of [employer] or Organizations: No    Attends Archivist Meetings: Never    Marital Status: Married  Human resources officer Violence: Not At Risk (03/02/2021)   Humiliation, Afraid, Rape, and Kick questionnaire    Fear of Current or Ex-Partner: No    Emotionally Abused: No    Physically Abused: No    Sexually Abused: No     Allergies  Allergen Reactions   Codeine     Nausea and vomiting, hallucinations   Morphine     B/P dropped!   Morphine And Related Shortness Of Breath   Other Itching and Diarrhea   Beef-Derived Products Nausea And Vomiting   Doxycycline Nausea And Vomiting   Ibuprofen     Caused RLS   Iodinated Contrast Media Nausea And Vomiting    Pt reports n/v only. No other reaction noted. No need for 13 hr prep.    Latex     REACTION: rash   Nsaids Other (See Comments)    PT has hx of ulcers.   Penicillins     hives   Pork-Derived Products Nausea And Vomiting   Tramadol     Caused RLS     Outpatient Medications Prior to Visit  Medication Sig Dispense Refill   Calcium Carbonate-Vitamin D (CALCIUM-VITAMIN D) 600-125 MG-UNIT TABS Take 2 tablets by mouth 2 (two) times daily.     ezetimibe (ZETIA) 10 MG tablet TAKE 1 TABLET (10 MG TOTAL) BY MOUTH DAILY. 90 tablet 0   hydrochlorothiazide (HYDRODIURIL) 12.5 MG tablet TAKE 1 TABLET (12.5 MG TOTAL) BY MOUTH DAILY. 90 tablet 1   HYDROcodone-acetaminophen (NORCO/VICODIN) 5-325 MG tablet Take  1 tablet by mouth every 6 (six) hours as needed for moderate pain. 12 tablet 0   Multiple Vitamin (MULTIVITAMIN) capsule Take 1 capsule by mouth daily.     Probiotic Product (PROBIOTIC DAILY PO) Take 1 tablet by mouth daily.     psyllium (METAMUCIL) 58.6 % powder Take 1 packet by mouth. Take 1 tsp daily  TURMERIC PO Take 1 tablet by mouth daily.     Cinnamon 500 MG capsule Take 1,000 mg by mouth daily.     Glucosamine-Chondroit-Vit C-Mn (GLUCOSAMINE 1500 COMPLEX PO) Take 1 tablet by mouth.     lidocaine (LIDODERM) 5 % Place 1 patch onto the skin every 12 (twelve) hours. Remove & Discard patch within 12 hours or as directed by MD 10 patch 0   predniSONE (DELTASONE) 10 MG tablet Take 6 tablets  today, on day 2 take 5 tablets, day 3 take 4 tablets, day 4 take 3 tablets, day 5 take  2 tablets and 1 tablet the last day 21 tablet 0   triamcinolone ointment (KENALOG) 0.5 % Apply 1 application topically 2 (two) times daily. 30 g 0   No facility-administered medications prior to visit.    Review of Systems  Constitutional:  Positive for malaise/fatigue and weight loss. Negative for chills and fever.  HENT:  Negative for hearing loss, sore throat and tinnitus.   Eyes:  Negative for blurred vision and double vision.  Respiratory:  Negative for cough, hemoptysis, sputum production, shortness of breath, wheezing and stridor.   Cardiovascular:  Negative for chest pain, palpitations, orthopnea, leg swelling and PND.  Gastrointestinal:  Negative for abdominal pain, constipation, diarrhea, heartburn, nausea and vomiting.  Genitourinary:  Negative for dysuria, hematuria and urgency.  Musculoskeletal:  Positive for back pain. Negative for joint pain and myalgias.  Skin:  Negative for itching and rash.  Neurological:  Negative for dizziness, tingling, weakness and headaches.  Endo/Heme/Allergies:  Negative for environmental allergies. Does not bruise/bleed easily.  Psychiatric/Behavioral:  Negative for  depression. The patient is not nervous/anxious and does not have insomnia.   All other systems reviewed and are negative.    Objective:  Physical Exam Vitals reviewed.  Constitutional:      General: She is not in acute distress.    Appearance: She is well-developed.  HENT:     Head: Normocephalic and atraumatic.  Eyes:     General: No scleral icterus.    Conjunctiva/sclera: Conjunctivae normal.     Pupils: Pupils are equal, round, and reactive to light.  Neck:     Vascular: No JVD.     Trachea: No tracheal deviation.  Cardiovascular:     Rate and Rhythm: Normal rate and regular rhythm.     Heart sounds: Normal heart sounds. No murmur heard. Pulmonary:     Effort: Pulmonary effort is normal. No tachypnea, accessory muscle usage or respiratory distress.     Breath sounds: No stridor. No wheezing, rhonchi or rales.  Abdominal:     General: There is no distension.     Palpations: Abdomen is soft.     Tenderness: There is no abdominal tenderness.  Musculoskeletal:        General: No tenderness.     Cervical back: Neck supple.  Lymphadenopathy:     Cervical: No cervical adenopathy.  Skin:    General: Skin is warm and dry.     Capillary Refill: Capillary refill takes less than 2 seconds.     Findings: No rash.  Neurological:     Mental Status: She is alert and oriented to person, place, and time.  Psychiatric:        Behavior: Behavior normal.      Vitals:   12/30/21 1328  BP: 126/68  Pulse: 61  Temp: 98.4 F (36.9 C)  TempSrc: Oral  SpO2: 94%  Height: 5\' 3"  (1.6 m)   94%  on RA BMI Readings from Last 3 Encounters:  12/30/21 32.42 kg/m  11/24/21 32.42 kg/m  07/07/21 34.90 kg/m   Wt Readings from Last 3 Encounters:  11/24/21 183 lb (83 kg)  07/07/21 197 lb (89.4 kg)  05/13/21 190 lb (86.2 kg)     CBC    Component Value Date/Time   WBC 7.6 07/07/2021 1125   WBC 10.3 02/08/2010 2145   RBC 4.68 07/07/2021 1125   RBC 4.63 02/08/2010 2145   HGB 15.1  07/07/2021 1125   HCT 43.3 07/07/2021 1125   PLT 239 07/07/2021 1125   MCV 93 07/07/2021 1125   MCH 32.3 07/07/2021 1125   MCH 33.3 02/08/2010 2145   MCHC 34.9 07/07/2021 1125   MCHC 34.1 02/08/2010 2145   RDW 12.6 07/07/2021 1125   LYMPHSABS 1.8 07/07/2021 1125   MONOABS 0.7 02/08/2010 2145   EOSABS 0.2 07/07/2021 1125   BASOSABS 0.1 07/07/2021 1125     Chest Imaging: July 2023 CT imaging: Left upper lobe mass with associated adenopathy, diffuse bone lesions concerning for advanced age bronchogenic carcinoma. The patient's images have been independently reviewed by me.    Pulmonary Functions Testing Results:     No data to display          FeNO:   Pathology:   Echocardiogram:   Heart Catheterization:     Assessment & Plan:     ICD-10-CM   1. Mass of left lung  R91.8     2. Abnormal CT scan of lung  R91.8     3. Metastasis to bone [facility])  C79.51       Discussion:  This is a 66 year old female with a new diagnosis of left lung mass abnormal CT imaging and concerning for bone metastasis with advanced age bronchogenic carcinoma.  Patient was referred for consideration for tissue biopsy.  Plan: Today in the office we discussed risk benefits alternatives proceed with bronchoscopy. Patient is agreeable to proceed.  We discussed the risk of bleeding and pneumothorax. Patient is agreeable to proceed with the bronchoscopy for next week Tentative plan for January 06, 2022.  She will need a brain MRI as well as a nuclear medicine PET scan to finish staging. We will also send a referral to medical oncology.    Current Outpatient Medications:    Calcium Carbonate-Vitamin D (CALCIUM-VITAMIN D) 600-125 MG-UNIT TABS, Take 2 tablets by mouth 2 (two) times daily., Disp: , Rfl:    ezetimibe (ZETIA) 10 MG tablet, TAKE 1 TABLET (10 MG TOTAL) BY MOUTH DAILY., Disp: 90 tablet, Rfl: 0   hydrochlorothiazide (HYDRODIURIL) 12.5 MG tablet, TAKE 1 TABLET (12.5 MG TOTAL) BY MOUTH  DAILY., Disp: 90 tablet, Rfl: 1   HYDROcodone-acetaminophen (NORCO/VICODIN) 5-325 MG tablet, Take 1 tablet by mouth every 6 (six) hours as needed for moderate pain., Disp: 12 tablet, Rfl: 0   Multiple Vitamin (MULTIVITAMIN) capsule, Take 1 capsule by mouth daily., Disp: , Rfl:    Probiotic Product (PROBIOTIC DAILY PO), Take 1 tablet by mouth daily., Disp: , Rfl:    psyllium (METAMUCIL) 58.6 % powder, Take 1 packet by mouth. Take 1 tsp daily, Disp: , Rfl:    TURMERIC PO, Take 1 tablet by mouth daily., Disp: , Rfl:    I spent 62 minutes dedicated to the care of this patient on the date of this encounter to include pre-visit review of records, face-to-face time with the patient discussing conditions above, post visit ordering of testing, clinical documentation with the electronic health record,  making appropriate referrals as documented, and communicating necessary findings to members of the patients care team.   Garner Nash, DO [facility] 12/30/2021 1:32 PM

## 2021-12-30 NOTE — Telephone Encounter (Signed)
noted 

## 2022-01-01 ENCOUNTER — Other Ambulatory Visit: Payer: Self-pay | Admitting: Radiation Therapy

## 2022-01-02 ENCOUNTER — Other Ambulatory Visit: Payer: Self-pay

## 2022-01-02 ENCOUNTER — Encounter (HOSPITAL_COMMUNITY): Payer: Self-pay | Admitting: Pulmonary Disease

## 2022-01-02 ENCOUNTER — Other Ambulatory Visit: Payer: Self-pay | Admitting: Pulmonary Disease

## 2022-01-02 DIAGNOSIS — Z01818 Encounter for other preprocedural examination: Secondary | ICD-10-CM

## 2022-01-02 LAB — SARS CORONAVIRUS 2 (TAT 6-24 HRS): SARS Coronavirus 2: NEGATIVE

## 2022-01-02 NOTE — Progress Notes (Signed)
Spoke with pt for pre-op call. Pt denies cardiac history and Diabetes. Pt is treated for HTN.   Covid test done 01/02/22 and it's negative.  Shower instructions given to pt and she voiced understanding.

## 2022-01-05 ENCOUNTER — Inpatient Hospital Stay: Payer: Medicare Other

## 2022-01-05 ENCOUNTER — Ambulatory Visit (HOSPITAL_COMMUNITY)
Admission: RE | Admit: 2022-01-05 | Discharge: 2022-01-05 | Disposition: A | Discharge status: Home / Self Care | Payer: Medicare Other | Source: Ambulatory Visit | Attending: Pulmonary Disease | Admitting: Pulmonary Disease

## 2022-01-05 DIAGNOSIS — C3490 Malignant neoplasm of unspecified part of unspecified bronchus or lung: Secondary | ICD-10-CM | POA: Insufficient documentation

## 2022-01-05 LAB — GLUCOSE, CAPILLARY: Glucose-Capillary: 136 mg/dL — ABNORMAL HIGH (ref 70–99)

## 2022-01-05 MED ORDER — FLUDEOXYGLUCOSE F - 18 (FDG) INJECTION
9.1000 | Freq: Once | INTRAVENOUS | Status: AC
  Administered 2022-01-05: 8.74 via INTRAVENOUS

## 2022-01-05 NOTE — Anesthesia Preprocedure Evaluation (Signed)
Anesthesia Evaluation  Patient identified by MRN, date of birth, ID band Patient awake    Reviewed: Allergy & Precautions, NPO status , Patient's Chart, lab work & pertinent test results  History of Anesthesia Complications (+) DIFFICULT AIRWAY and history of anesthetic complications (difficult intubation, OK with glide)  Airway Mallampati: I  TM Distance: >3 FB Neck ROM: Full    Dental  (+) Poor Dentition, Dental Advisory Given, Missing   Pulmonary former smoker,  Lung Ca   breath sounds clear to auscultation       Cardiovascular hypertension, Pt. on medications (-) angina Rhythm:Regular Rate:Normal     Neuro/Psych  Headaches, Depression Chronic back pain    GI/Hepatic Neg liver ROS, hiatal hernia, GERD  Controlled,  Endo/Other  obese  Renal/GU negative Renal ROS     Musculoskeletal  (+) Arthritis , Fibromyalgia -, narcotic dependent  Abdominal (+) + obese,   Peds  Hematology negative hematology ROS (+)   Anesthesia Other Findings   Reproductive/Obstetrics                            Anesthesia Physical Anesthesia Plan  ASA: 2  Anesthesia Plan: General   Post-op Pain Management: Tylenol PO (pre-op)* and Minimal or no pain anticipated   Induction: Intravenous  PONV Risk Score and Plan: 3 and Ondansetron, Dexamethasone and Scopolamine patch - Pre-op  Airway Management Planned: Oral ETT and Video Laryngoscope Planned  Additional Equipment: None  Intra-op Plan:   Post-operative Plan: Extubation in OR  Informed Consent: I have reviewed the patients History and Physical, chart, labs and discussed the procedure including the risks, benefits and alternatives for the proposed anesthesia with the patient or authorized representative who has indicated his/her understanding and acceptance.     Dental advisory given  Plan Discussed with: CRNA and Surgeon  Anesthesia Plan Comments:         Anesthesia Quick Evaluation

## 2022-01-06 ENCOUNTER — Ambulatory Visit (HOSPITAL_BASED_OUTPATIENT_CLINIC_OR_DEPARTMENT_OTHER): Payer: Medicare Other | Admitting: Anesthesiology

## 2022-01-06 ENCOUNTER — Ambulatory Visit (HOSPITAL_COMMUNITY): Payer: Medicare Other | Admitting: Anesthesiology

## 2022-01-06 ENCOUNTER — Ambulatory Visit (HOSPITAL_BASED_OUTPATIENT_CLINIC_OR_DEPARTMENT_OTHER)
Admission: RE | Admit: 2022-01-06 | Discharge: 2022-01-06 | Disposition: A | Discharge status: Home / Self Care | Payer: Medicare Other | Source: Home / Self Care | Attending: Pulmonary Disease | Admitting: Pulmonary Disease

## 2022-01-06 ENCOUNTER — Encounter (HOSPITAL_COMMUNITY)
Admission: RE | Disposition: A | Discharge status: Home / Self Care | Payer: Self-pay | Source: Home / Self Care | Attending: Pulmonary Disease

## 2022-01-06 ENCOUNTER — Encounter: Payer: Medicare Other | Admitting: Physician Assistant

## 2022-01-06 ENCOUNTER — Encounter (HOSPITAL_COMMUNITY): Payer: Self-pay | Admitting: Pulmonary Disease

## 2022-01-06 ENCOUNTER — Telehealth: Payer: Self-pay | Admitting: Radiation Therapy

## 2022-01-06 ENCOUNTER — Other Ambulatory Visit: Payer: Self-pay | Admitting: Radiation Therapy

## 2022-01-06 DIAGNOSIS — E669 Obesity, unspecified: Secondary | ICD-10-CM | POA: Insufficient documentation

## 2022-01-06 DIAGNOSIS — Z87891 Personal history of nicotine dependence: Secondary | ICD-10-CM | POA: Insufficient documentation

## 2022-01-06 DIAGNOSIS — I10 Essential (primary) hypertension: Secondary | ICD-10-CM | POA: Insufficient documentation

## 2022-01-06 DIAGNOSIS — M797 Fibromyalgia: Secondary | ICD-10-CM | POA: Insufficient documentation

## 2022-01-06 DIAGNOSIS — R918 Other nonspecific abnormal finding of lung field: Secondary | ICD-10-CM | POA: Diagnosis not present

## 2022-01-06 DIAGNOSIS — Z79899 Other long term (current) drug therapy: Secondary | ICD-10-CM | POA: Insufficient documentation

## 2022-01-06 DIAGNOSIS — C7951 Secondary malignant neoplasm of bone: Secondary | ICD-10-CM

## 2022-01-06 DIAGNOSIS — G08 Intracranial and intraspinal phlebitis and thrombophlebitis: Secondary | ICD-10-CM | POA: Diagnosis not present

## 2022-01-06 DIAGNOSIS — M549 Dorsalgia, unspecified: Secondary | ICD-10-CM | POA: Insufficient documentation

## 2022-01-06 DIAGNOSIS — C3412 Malignant neoplasm of upper lobe, left bronchus or lung: Secondary | ICD-10-CM | POA: Diagnosis not present

## 2022-01-06 DIAGNOSIS — F1120 Opioid dependence, uncomplicated: Secondary | ICD-10-CM | POA: Insufficient documentation

## 2022-01-06 DIAGNOSIS — K219 Gastro-esophageal reflux disease without esophagitis: Secondary | ICD-10-CM | POA: Insufficient documentation

## 2022-01-06 DIAGNOSIS — C771 Secondary and unspecified malignant neoplasm of intrathoracic lymph nodes: Secondary | ICD-10-CM | POA: Insufficient documentation

## 2022-01-06 DIAGNOSIS — R599 Enlarged lymph nodes, unspecified: Secondary | ICD-10-CM | POA: Diagnosis not present

## 2022-01-06 DIAGNOSIS — Z6831 Body mass index (BMI) 31.0-31.9, adult: Secondary | ICD-10-CM | POA: Insufficient documentation

## 2022-01-06 DIAGNOSIS — K449 Diaphragmatic hernia without obstruction or gangrene: Secondary | ICD-10-CM | POA: Insufficient documentation

## 2022-01-06 DIAGNOSIS — G8929 Other chronic pain: Secondary | ICD-10-CM | POA: Insufficient documentation

## 2022-01-06 HISTORY — DX: Personal history of other diseases of the digestive system: Z87.19

## 2022-01-06 HISTORY — PX: BRONCHIAL NEEDLE ASPIRATION BIOPSY: SHX5106

## 2022-01-06 HISTORY — PX: VIDEO BRONCHOSCOPY WITH ENDOBRONCHIAL ULTRASOUND: SHX6177

## 2022-01-06 HISTORY — DX: Fatty (change of) liver, not elsewhere classified: K76.0

## 2022-01-06 HISTORY — DX: Gastro-esophageal reflux disease without esophagitis: K21.9

## 2022-01-06 LAB — CBC
HCT: 40.9 % (ref 36.0–46.0)
Hemoglobin: 13.8 g/dL (ref 12.0–15.0)
MCH: 33 pg (ref 26.0–34.0)
MCHC: 33.7 g/dL (ref 30.0–36.0)
MCV: 97.8 fL (ref 80.0–100.0)
Platelets: 301 10*3/uL (ref 150–400)
RBC: 4.18 MIL/uL (ref 3.87–5.11)
RDW: 12.9 % (ref 11.5–15.5)
WBC: 7.3 10*3/uL (ref 4.0–10.5)
nRBC: 0 % (ref 0.0–0.2)

## 2022-01-06 LAB — BASIC METABOLIC PANEL
Anion gap: 10 (ref 5–15)
BUN: 13 mg/dL (ref 8–23)
CO2: 26 mmol/L (ref 22–32)
Calcium: 9.8 mg/dL (ref 8.9–10.3)
Chloride: 101 mmol/L (ref 98–111)
Creatinine, Ser: 0.61 mg/dL (ref 0.44–1.00)
GFR, Estimated: >60 mL/min (ref >60)
Glucose, Bld: 130 mg/dL — ABNORMAL HIGH (ref 70–99)
Potassium: 3.4 mmol/L — ABNORMAL LOW (ref 3.5–5.1)
Sodium: 137 mmol/L (ref 135–145)

## 2022-01-06 SURGERY — BRONCHOSCOPY, WITH EBUS
Anesthesia: General | Laterality: Left

## 2022-01-06 MED ORDER — LACTATED RINGERS IV SOLN: INTRAVENOUS | Status: DC

## 2022-01-06 MED ORDER — ACETAMINOPHEN 500 MG PO TABS
1000.0000 mg | ORAL_TABLET | Freq: Once | ORAL | Status: AC
Start: 2022-01-06 — End: 2022-01-06
  Administered 2022-01-06: 1000 mg via ORAL
  Filled 2022-01-06 (×2): qty 2

## 2022-01-06 MED ORDER — OXYCODONE HCL 5 MG/5ML PO SOLN: 5.0000 mg | Freq: Once | ORAL | Status: DC | PRN

## 2022-01-06 MED ORDER — CHLORHEXIDINE GLUCONATE 0.12 % MT SOLN
15.0000 mL | OROMUCOSAL | Status: AC
  Administered 2022-01-06: 15 mL via OROMUCOSAL
  Filled 2022-01-06 (×2): qty 15

## 2022-01-06 MED ORDER — MIDAZOLAM HCL 2 MG/2ML IJ SOLN: 0.5000 mg | Freq: Once | INTRAMUSCULAR | Status: DC | PRN

## 2022-01-06 MED ORDER — SUGAMMADEX SODIUM 200 MG/2ML IV SOLN
INTRAVENOUS | Status: DC | PRN
  Administered 2022-01-06: 200 mg via INTRAVENOUS

## 2022-01-06 MED ORDER — LIDOCAINE 2% (20 MG/ML) 5 ML SYRINGE
INTRAMUSCULAR | Status: DC | PRN
  Administered 2022-01-06: 40 mg via INTRAVENOUS

## 2022-01-06 MED ORDER — MEPERIDINE HCL 25 MG/ML IJ SOLN
6.2500 mg | INTRAMUSCULAR | Status: DC | PRN
  Filled 2022-01-06: qty 1

## 2022-01-06 MED ORDER — PHENYLEPHRINE HCL-NACL 20-0.9 MG/250ML-% IV SOLN
INTRAVENOUS | Status: DC | PRN
  Administered 2022-01-06: 25 ug/min via INTRAVENOUS

## 2022-01-06 MED ORDER — PROMETHAZINE HCL 25 MG/ML IJ SOLN: 6.2500 mg | INTRAMUSCULAR | Status: DC | PRN

## 2022-01-06 MED ORDER — MIDAZOLAM HCL 2 MG/2ML IJ SOLN
INTRAMUSCULAR | Status: DC | PRN
  Administered 2022-01-06: 1 mg via INTRAVENOUS

## 2022-01-06 MED ORDER — FENTANYL CITRATE (PF) 100 MCG/2ML IJ SOLN
INTRAMUSCULAR | Status: DC | PRN
  Administered 2022-01-06 (×2): 50 ug via INTRAVENOUS

## 2022-01-06 MED ORDER — FENTANYL CITRATE (PF) 100 MCG/2ML IJ SOLN: 25.0000 ug | INTRAMUSCULAR | Status: DC | PRN

## 2022-01-06 MED ORDER — PROPOFOL 10 MG/ML IV BOLUS
INTRAVENOUS | Status: DC | PRN
  Administered 2022-01-06: 200 mg via INTRAVENOUS

## 2022-01-06 MED ORDER — ROCURONIUM BROMIDE 10 MG/ML (PF) SYRINGE
PREFILLED_SYRINGE | INTRAVENOUS | Status: DC | PRN
  Administered 2022-01-06: 60 mg via INTRAVENOUS

## 2022-01-06 MED ORDER — DEXAMETHASONE SODIUM PHOSPHATE 10 MG/ML IJ SOLN
INTRAMUSCULAR | Status: DC | PRN
  Administered 2022-01-06: 10 mg via INTRAVENOUS

## 2022-01-06 MED ORDER — SCOPOLAMINE 1 MG/3DAYS TD PT72
1.0000 | MEDICATED_PATCH | TRANSDERMAL | Status: DC
  Administered 2022-01-06: 1.5 mg via TRANSDERMAL
  Filled 2022-01-06: qty 1

## 2022-01-06 MED ORDER — OXYCODONE HCL 5 MG PO TABS: 5.0000 mg | ORAL_TABLET | Freq: Once | ORAL | Status: DC | PRN

## 2022-01-06 MED ORDER — ONDANSETRON HCL 4 MG/2ML IJ SOLN
INTRAMUSCULAR | Status: DC | PRN
  Administered 2022-01-06: 4 mg via INTRAVENOUS

## 2022-01-06 SURGICAL SUPPLY — 30 items

## 2022-01-06 NOTE — Anesthesia Procedure Notes (Signed)
Procedure Name: Intubation Date/Time: 01/06/2022 7:57 AM  Performed by: Mariea Clonts, CRNAPre-anesthesia Checklist: Patient identified, Emergency Drugs available, Suction available and Patient being monitored Patient Re-evaluated:Patient Re-evaluated prior to induction Oxygen Delivery Method: Circle System Utilized Preoxygenation: Pre-oxygenation with 100% oxygen Induction Type: IV induction Ventilation: Mask ventilation without difficulty Laryngoscope Size: Glidescope and 3 Grade View: Grade I Tube type: Oral Tube size: 8.5 mm Number of attempts: 1 Airway Equipment and Method: Stylet and Oral airway Placement Confirmation: ETT inserted through vocal cords under direct vision, positive ETCO2 and breath sounds checked- equal and bilateral Tube secured with: Tape Dental Injury: Teeth and Oropharynx as per pre-operative assessment  Difficulty Due To: Difficulty was anticipated and Difficult Airway- due to dentition

## 2022-01-06 NOTE — Telephone Encounter (Signed)
I called Ms. Malesky to check in on her knowing she had the lung biopsy today. I also wanted to make her aware of the referral to interventional radiology that has been entered on her behalf. She is tired, and recovering from this morning's procedure. Overall her main complaint is still pain in her back and hips. She is concerned that her prescription will run out tomorrow and she will not be seen by Dr. Tammi Klippel until Friday. I have encouraged her to go ahead and call Dr. Colleen Can office back this afternoon to request a refill before she runs out.   Mont Dutton R.T.(R)(T) Radiation Special Procedures Navigator

## 2022-01-06 NOTE — Interval H&P Note (Signed)
History and Physical Interval Note:  01/06/2022 7:37 AM  Misty Decker  has presented today for surgery, with the diagnosis of lung mass, adenopathy.  The various methods of treatment have been discussed with the patient and family. After consideration of risks, benefits and other options for treatment, the patient has consented to  Procedure(s): [place] (Left) as a surgical intervention.  The patient's history has been reviewed, patient examined, no change in status, stable for surgery.  I have reviewed the patient's chart and labs.  Questions were answered to the patient's satisfaction.     [place]

## 2022-01-06 NOTE — Discharge Instructions (Signed)
Flexible Bronchoscopy, Care After This sheet gives you information about how to care for yourself after your test. Your doctor may also give you more specific instructions. If you have problems or questions, contact your doctor. Follow these instructions at home: Eating and drinking Do not eat or drink anything (not even water) for 2 hours after your test, or until your numbing medicine (local anesthetic) wears off. When your numbness is gone and your cough and gag reflexes have come back, you may: Eat only soft foods. Slowly drink liquids. The day after the test, go back to your normal diet. Driving Do not drive for 24 hours if you were given a medicine to help you relax (sedative). Do not drive or use heavy machinery while taking prescription pain medicine. General instructions  Take over-the-counter and prescription medicines only as told by your doctor. Return to your normal activities as told. Ask what activities are safe for you. Do not use any products that have nicotine or tobacco in them. This includes cigarettes and e-cigarettes. If you need help quitting, ask your doctor. Keep all follow-up visits as told by your doctor. This is important. It is very important if you had a tissue sample (biopsy) taken. Get help right away if: You have shortness of breath that gets worse. You get light-headed. You feel like you are going to pass out (faint). You have chest pain. You cough up: More than a little blood. More blood than before. Summary Do not eat or drink anything (not even water) for 2 hours after your test, or until your numbing medicine wears off. Do not use cigarettes. Do not use e-cigarettes. Get help right away if you have chest pain.  This information is not intended to replace advice given to you by your health care provider. Make sure you discuss any questions you have with your health care provider. Document Released: 03/22/2009 Document Revised: 05/07/2017 Document  Reviewed: 06/12/2016 Elsevier Patient Education  2020 [employer].

## 2022-01-06 NOTE — Anesthesia Postprocedure Evaluation (Signed)
Anesthesia Post Note  Patient: Misty Decker  Procedure(s) Performed: VIDEO BRONCHOSCOPY WITH ENDOBRONCHIAL ULTRASOUND (Left) BRONCHIAL NEEDLE ASPIRATION BIOPSIES     Patient location during evaluation: PACU Anesthesia Type: General Level of consciousness: awake and alert, patient cooperative and oriented Pain management: pain level controlled Vital Signs Assessment: post-procedure vital signs reviewed and stable Respiratory status: spontaneous breathing, nonlabored ventilation and respiratory function stable Cardiovascular status: blood pressure returned to baseline and stable Postop Assessment: no apparent nausea or vomiting Anesthetic complications: yes   Encounter Notable Events  Notable Event Outcome Phase Comment  Difficult to intubate - expected  Intraprocedure Filed from anesthesia note documentation.    Last Vitals:  Vitals:   01/06/22 0845 01/06/22 0900  BP: (!) 140/61 111/72  Pulse: 91 87  Resp:  19  Temp: 37.3 C   SpO2: 96% 93%    Last Pain:  Vitals:   01/06/22 0900  TempSrc:   PainSc: 9                  Abb Gobert,E. Glenis Musolf

## 2022-01-06 NOTE — Op Note (Signed)
Video Bronchoscopy with Endobronchial Ultrasound Procedure Note  Date of Operation: 01/06/2022  Pre-op Diagnosis: Lung mass, adenopathy   Post-op Diagnosis: Lung mass, adenopathy  Surgeon: Garner Nash, DO  Assistants: None   Anesthesia: General endotracheal anesthesia  Operation: Flexible video fiberoptic bronchoscopy with endobronchial ultrasound and biopsies.  Estimated Blood Loss: Minimal  Complications: None   Indications and History: Misty Decker is a 66 y.o. female with lung mass, adenopathy.  The risks, benefits, complications, treatment options and expected outcomes were discussed with the patient.  The possibilities of pneumothorax, pneumonia, reaction to medication, pulmonary aspiration, perforation of a viscus, bleeding, failure to diagnose a condition and creating a complication requiring transfusion or operation were discussed with the patient who freely signed the consent.    Description of Procedure: The patient was examined in the preoperative area and history and data from the preprocedure consultation were reviewed. It was deemed appropriate to proceed.  The patient was taken to 3, identified as Misty Decker and the procedure verified as Flexible Video Fiberoptic Bronchoscopy.  A Time Out was held and the above information confirmed. After being taken to the operating room general anesthesia was initiated and the patient  was orally intubated. The video fiberoptic bronchoscope was introduced via the endotracheal tube and a general inspection was performed which showed normal right and left lung anatomy no evidence of endobronchial lesion. The standard scope was then withdrawn and the endobronchial ultrasound was used to identify and characterize the peritracheal, hilar and bronchial lymph nodes. Inspection showed enlarged subcarinal node, station 7. Using real-time ultrasound guidance Wang needle biopsies were take from Station 7 nodes and were sent for  cytology. The patient tolerated the procedure well without apparent complications. There was no significant blood loss. The bronchoscope was withdrawn. Anesthesia was reversed and the patient was taken to the PACU for recovery.   Samples: 1. Wang needle biopsies from 7 node  Plans:  The patient will be discharged from the PACU to home when recovered from anesthesia. We will review the cytology, pathology results with the patient when they become available. Outpatient followup will be with Garner Nash, DO.    Garner Nash, DO [facility] 01/06/2022 8:40 AM

## 2022-01-06 NOTE — Transfer of Care (Signed)
Immediate Anesthesia Transfer of Care Note  Patient: Neena Beecham Nilson  Procedure(s) Performed: VIDEO BRONCHOSCOPY WITH ENDOBRONCHIAL ULTRASOUND (Left) BRONCHIAL NEEDLE ASPIRATION BIOPSIES  Patient Location: PACU  Anesthesia Type:General  Level of Consciousness: awake, alert  and oriented  Airway & Oxygen Therapy: Patient Spontanous Breathing  Post-op Assessment: Report given to RN and Post -op Vital signs reviewed and stable  Post vital signs: Reviewed and stable  Last Vitals:  Vitals Value Taken Time  BP 140/61 01/06/22 0846  Temp 37.3 C 01/06/22 0845  Pulse 93 01/06/22 0851  Resp 27 01/06/22 0851  SpO2 96 % 01/06/22 0851  Vitals shown include unvalidated device data.  Last Pain:  Vitals:   01/06/22 0845  TempSrc:   PainSc: 9          Complications:  Encounter Notable Events  Notable Event Outcome Phase Comment  Difficult to intubate - expected  Intraprocedure Filed from anesthesia note documentation.

## 2022-01-07 ENCOUNTER — Other Ambulatory Visit: Payer: Self-pay | Admitting: Physician Assistant

## 2022-01-07 DIAGNOSIS — I10 Essential (primary) hypertension: Secondary | ICD-10-CM

## 2022-01-08 ENCOUNTER — Telehealth: Payer: Self-pay | Admitting: Pulmonary Disease

## 2022-01-08 ENCOUNTER — Inpatient Hospital Stay (HOSPITAL_COMMUNITY)
Admission: EM | Admit: 2022-01-08 | Discharge: 2022-01-28 | DRG: 180 | Disposition: A | Discharge status: Home Health | Payer: Medicare Other | Attending: Internal Medicine | Admitting: Internal Medicine

## 2022-01-08 ENCOUNTER — Emergency Department (HOSPITAL_COMMUNITY): Payer: Medicare Other

## 2022-01-08 ENCOUNTER — Encounter (HOSPITAL_COMMUNITY): Payer: Self-pay | Admitting: Emergency Medicine

## 2022-01-08 ENCOUNTER — Other Ambulatory Visit: Payer: Self-pay

## 2022-01-08 ENCOUNTER — Ambulatory Visit (HOSPITAL_COMMUNITY)
Admission: RE | Admit: 2022-01-08 | Discharge: 2022-01-08 | Disposition: A | Discharge status: Home / Self Care | Payer: Medicare Other | Source: Ambulatory Visit | Attending: Pulmonary Disease | Admitting: Pulmonary Disease

## 2022-01-08 DIAGNOSIS — Z91040 Latex allergy status: Secondary | ICD-10-CM

## 2022-01-08 DIAGNOSIS — Z79899 Other long term (current) drug therapy: Secondary | ICD-10-CM

## 2022-01-08 DIAGNOSIS — Z885 Allergy status to narcotic agent status: Secondary | ICD-10-CM

## 2022-01-08 DIAGNOSIS — Z803 Family history of malignant neoplasm of breast: Secondary | ICD-10-CM

## 2022-01-08 DIAGNOSIS — Z8616 Personal history of COVID-19: Secondary | ICD-10-CM

## 2022-01-08 DIAGNOSIS — I8290 Acute embolism and thrombosis of unspecified vein: Secondary | ICD-10-CM | POA: Diagnosis present

## 2022-01-08 DIAGNOSIS — C3490 Malignant neoplasm of unspecified part of unspecified bronchus or lung: Secondary | ICD-10-CM | POA: Insufficient documentation

## 2022-01-08 DIAGNOSIS — Z515 Encounter for palliative care: Secondary | ICD-10-CM

## 2022-01-08 DIAGNOSIS — Z7189 Other specified counseling: Secondary | ICD-10-CM

## 2022-01-08 DIAGNOSIS — M797 Fibromyalgia: Secondary | ICD-10-CM | POA: Diagnosis present

## 2022-01-08 DIAGNOSIS — C3492 Malignant neoplasm of unspecified part of left bronchus or lung: Secondary | ICD-10-CM

## 2022-01-08 DIAGNOSIS — E669 Obesity, unspecified: Secondary | ICD-10-CM | POA: Diagnosis present

## 2022-01-08 DIAGNOSIS — E559 Vitamin D deficiency, unspecified: Secondary | ICD-10-CM

## 2022-01-08 DIAGNOSIS — G08 Intracranial and intraspinal phlebitis and thrombophlebitis: Secondary | ICD-10-CM | POA: Diagnosis present

## 2022-01-08 DIAGNOSIS — Z91014 Allergy to mammalian meats: Secondary | ICD-10-CM

## 2022-01-08 DIAGNOSIS — Z833 Family history of diabetes mellitus: Secondary | ICD-10-CM

## 2022-01-08 DIAGNOSIS — E785 Hyperlipidemia, unspecified: Secondary | ICD-10-CM | POA: Diagnosis present

## 2022-01-08 DIAGNOSIS — R339 Retention of urine, unspecified: Secondary | ICD-10-CM | POA: Diagnosis present

## 2022-01-08 DIAGNOSIS — I10 Essential (primary) hypertension: Secondary | ICD-10-CM | POA: Diagnosis present

## 2022-01-08 DIAGNOSIS — Z83438 Family history of other disorder of lipoprotein metabolism and other lipidemia: Secondary | ICD-10-CM

## 2022-01-08 DIAGNOSIS — Z880 Allergy status to penicillin: Secondary | ICD-10-CM

## 2022-01-08 DIAGNOSIS — C419 Malignant neoplasm of bone and articular cartilage, unspecified: Secondary | ICD-10-CM

## 2022-01-08 DIAGNOSIS — Z87891 Personal history of nicotine dependence: Secondary | ICD-10-CM

## 2022-01-08 DIAGNOSIS — Z6834 Body mass index (BMI) 34.0-34.9, adult: Secondary | ICD-10-CM

## 2022-01-08 DIAGNOSIS — C3412 Malignant neoplasm of upper lobe, left bronchus or lung: Principal | ICD-10-CM | POA: Diagnosis present

## 2022-01-08 DIAGNOSIS — Z91041 Radiographic dye allergy status: Secondary | ICD-10-CM

## 2022-01-08 DIAGNOSIS — Z981 Arthrodesis status: Secondary | ICD-10-CM

## 2022-01-08 DIAGNOSIS — C7951 Secondary malignant neoplasm of bone: Secondary | ICD-10-CM | POA: Diagnosis present

## 2022-01-08 DIAGNOSIS — K760 Fatty (change of) liver, not elsewhere classified: Secondary | ICD-10-CM | POA: Diagnosis present

## 2022-01-08 DIAGNOSIS — I669 Occlusion and stenosis of unspecified cerebral artery: Secondary | ICD-10-CM | POA: Diagnosis present

## 2022-01-08 DIAGNOSIS — Z886 Allergy status to analgesic agent status: Secondary | ICD-10-CM

## 2022-01-08 DIAGNOSIS — Z8249 Family history of ischemic heart disease and other diseases of the circulatory system: Secondary | ICD-10-CM

## 2022-01-08 DIAGNOSIS — T40605A Adverse effect of unspecified narcotics, initial encounter: Secondary | ICD-10-CM | POA: Diagnosis present

## 2022-01-08 DIAGNOSIS — K5903 Drug induced constipation: Secondary | ICD-10-CM | POA: Diagnosis present

## 2022-01-08 LAB — PROTIME-INR
INR: 1 (ref 0.8–1.2)
Prothrombin Time: 12.9 seconds (ref 11.4–15.2)

## 2022-01-08 LAB — BASIC METABOLIC PANEL
Anion gap: 12 (ref 5–15)
BUN: 17 mg/dL (ref 8–23)
CO2: 26 mmol/L (ref 22–32)
Calcium: 9.4 mg/dL (ref 8.9–10.3)
Chloride: 100 mmol/L (ref 98–111)
Creatinine, Ser: 0.61 mg/dL (ref 0.44–1.00)
GFR, Estimated: >60 mL/min (ref >60)
Glucose, Bld: 108 mg/dL — ABNORMAL HIGH (ref 70–99)
Potassium: 3.7 mmol/L (ref 3.5–5.1)
Sodium: 138 mmol/L (ref 135–145)

## 2022-01-08 LAB — CBC WITH DIFFERENTIAL/PLATELET
Abs Immature Granulocytes: 0.05 10*3/uL (ref 0.00–0.07)
Basophils Absolute: 0.1 10*3/uL (ref 0.0–0.1)
Basophils Relative: 1 %
Eosinophils Absolute: 0.2 10*3/uL (ref 0.0–0.5)
Eosinophils Relative: 2 %
HCT: 38.4 % (ref 36.0–46.0)
Hemoglobin: 13.1 g/dL (ref 12.0–15.0)
Immature Granulocytes: 1 %
Lymphocytes Relative: 18 %
Lymphs Abs: 1.5 10*3/uL (ref 0.7–4.0)
MCH: 33.2 pg (ref 26.0–34.0)
MCHC: 34.1 g/dL (ref 30.0–36.0)
MCV: 97.2 fL (ref 80.0–100.0)
Monocytes Absolute: 0.6 10*3/uL (ref 0.1–1.0)
Monocytes Relative: 8 %
Neutro Abs: 5.8 10*3/uL (ref 1.7–7.7)
Neutrophils Relative %: 70 %
Platelets: 289 10*3/uL (ref 150–400)
RBC: 3.95 MIL/uL (ref 3.87–5.11)
RDW: 12.9 % (ref 11.5–15.5)
WBC: 8.2 10*3/uL (ref 4.0–10.5)
nRBC: 0 % (ref 0.0–0.2)

## 2022-01-08 LAB — APTT: aPTT: 28 seconds (ref 24–36)

## 2022-01-08 LAB — ANTITHROMBIN III: AntiThromb III Func: 129 % — ABNORMAL HIGH (ref 75–120)

## 2022-01-08 MED ORDER — HEPARIN BOLUS VIA INFUSION
4000.0000 [IU] | Freq: Once | INTRAVENOUS | Status: DC
  Filled 2022-01-08: qty 4000

## 2022-01-08 MED ORDER — EZETIMIBE 10 MG PO TABS
10.0000 mg | ORAL_TABLET | Freq: Every day | ORAL | Status: DC
  Administered 2022-01-09 – 2022-01-28 (×20): 10 mg via ORAL
  Filled 2022-01-08 (×20): qty 1

## 2022-01-08 MED ORDER — ENOXAPARIN SODIUM 80 MG/0.8ML IJ SOSY
80.0000 mg | PREFILLED_SYRINGE | Freq: Two times a day (BID) | INTRAMUSCULAR | Status: DC
  Administered 2022-01-08 – 2022-01-18 (×20): 80 mg via SUBCUTANEOUS
  Filled 2022-01-08 (×23): qty 0.8

## 2022-01-08 MED ORDER — HYDROCODONE-ACETAMINOPHEN 5-325 MG PO TABS
1.0000 | ORAL_TABLET | Freq: Four times a day (QID) | ORAL | Status: DC | PRN
  Administered 2022-01-08: 1 via ORAL
  Filled 2022-01-08: qty 1

## 2022-01-08 MED ORDER — HEPARIN (PORCINE) 25000 UT/250ML-% IV SOLN
1150.0000 [IU]/h | INTRAVENOUS | Status: DC
  Filled 2022-01-08: qty 250

## 2022-01-08 MED ORDER — GADOBUTROL 1 MMOL/ML IV SOLN
8.0000 mL | Freq: Once | INTRAVENOUS | Status: AC | PRN
  Administered 2022-01-08: 8 mL via INTRAVENOUS

## 2022-01-08 MED ORDER — HYDROCHLOROTHIAZIDE 12.5 MG PO TABS
12.5000 mg | ORAL_TABLET | Freq: Every day | ORAL | Status: DC
  Administered 2022-01-09 – 2022-01-28 (×20): 12.5 mg via ORAL
  Filled 2022-01-08 (×20): qty 1

## 2022-01-08 MED ORDER — OXYCODONE HCL 5 MG PO TABS
5.0000 mg | ORAL_TABLET | Freq: Four times a day (QID) | ORAL | Status: DC | PRN
  Administered 2022-01-08 – 2022-01-09 (×3): 5 mg via ORAL
  Filled 2022-01-08 (×3): qty 1

## 2022-01-08 NOTE — Telephone Encounter (Signed)
I called the ER and gave them a heads up per Tammy recommendations. The nurse was appreciative of the call. Nothing further needed.

## 2022-01-08 NOTE — ED Triage Notes (Signed)
Pt was sent by [facility] for further eval when MRI today showed a blood clot.  Pt has significant hx of CA and back sx.  No blood thinners.

## 2022-01-08 NOTE — Telephone Encounter (Signed)
Received call report from [facility] Radiology on patient's MRI brain done on 01/08/22. Dr. Valeta Harms please review the result/impression copied below:  IMPRESSION: Thrombus within the straight sinus.   No evidence of intracranial metastatic disease.  Please advise, thank you.

## 2022-01-08 NOTE — H&P (Signed)
History and Physical    Atiyana Welte Decker YQI:[id number] DOB: Jan 25, 1956 DOA: 01/08/2022  PCP: Lorrene Reid, PA-C  Patient coming from: Home.  Chief Complaint: Abnormal MRI brain.  HPI: Misty Decker is a 66 y.o. female with history of hypertension, hyperlipidemia, fibromyalgia who was recently diagnosed with lung mass underwent biopsy 2 days ago also has been found to have possible spinal metastasis had MRI brain done for metastatic work-up was found to have straight sinus thrombus and was advised to come to the ER.  Patient does have some lower extremity pain and numbness for the last few weeks denies any incontinence of urine or bowel.  Denies any visual symptoms or focal deficits.  Has been having bandlike headache which does not disturb for the last few weeks.  ED Course: In the ER patient was evaluated by the neurologist.  Lovenox was started and patient admitted for further observation.  Labs at baseline.  Review of Systems: As per HPI, rest all negative.   Past Medical History:  Diagnosis Date   Complication of anesthesia    "Hard to sedate", per pt and slow to wake up   COVID 10/2020   Degenerative joint disease    s/p cervical and lumbar fusions   Depression    pt denies   Encounter for preventive health examination    Next mammogram in 07/2009. Next colonoscopy in 09/2017. DEXA 4/09>Normal   Encounter for screening colonoscopy 10/03/2007   Normal   Fatty liver    Fibromyalgia    GERD (gastroesophageal reflux disease)    Hemorrhoid    hx of   History of hiatal hernia    Hyperlipidemia    Hypertension    Sciatica     Past Surgical History:  Procedure Laterality Date   ABDOMINAL HYSTERECTOMY N/A    [employer]reesia 11/21/2019   APPENDECTOMY N/A    [employer]reesia 11/21/2019   BRONCHIAL NEEDLE ASPIRATION BIOPSY  01/06/2022   Procedure: BRONCHIAL NEEDLE ASPIRATION BIOPSIES;  Surgeon: Garner Nash, DO;  Location: [facility];  Service: Pulmonary;;   CERVICAL FUSION   2010   X 2   LUMBAR FUSION  1997   X 2/has plate and screws and cage   OVARIAN CYST SURGERY  1980   pelvic prolapse  1987   RECTAL PROLAPSE REPAIR     SPINE SURGERY N/A    [employer]reesia 11/21/2019   TONSILLECTOMY     and adenoids   TOTAL ABDOMINAL HYSTERECTOMY  1987   VIDEO BRONCHOSCOPY WITH ENDOBRONCHIAL ULTRASOUND Left 01/06/2022   Procedure: VIDEO BRONCHOSCOPY WITH ENDOBRONCHIAL ULTRASOUND;  Surgeon: Garner Nash, DO;  Location: [facility];  Service: Pulmonary;  Laterality: Left;     reports that she quit smoking about 18 years ago. Her smoking use included cigarettes. She has a 60.00 pack-year smoking history. She has never used smokeless tobacco. She reports that she does not currently use alcohol. She reports that she does not use drugs.  Allergies  Allergen Reactions   Codeine     Nausea and vomiting, hallucinations, bad dreams    Morphine Shortness Of Breath    B/P dropped!   Beef-Derived Products Nausea And Vomiting    Alpha Gal   Doxycycline Nausea And Vomiting   Ibuprofen     Stomach Ulcers, stomach scarred    Iodinated Contrast Media Nausea And Vomiting    Pt reports n/v only. No other reaction noted. No need for 13 hr prep.    Latex Rash   Nsaids Other (See Comments)  PT has hx of ulcers.   Penicillins Nausea And Vomiting    hives   Pork-Derived Products Nausea And Vomiting   Tramadol     Caused RLS   Gabapentin Other (See Comments)    Pt states this made her feel like she was always depressed and not caring about anything    Family History  Problem Relation Age of Onset   Cancer Mother    Diabetes Mother    Breast cancer Mother    Heart attack Mother    Hyperlipidemia Mother    Hyperlipidemia Father    Hypertension Father    Aneurysm Father    Dementia Maternal Grandfather    Cancer Paternal Grandmother        intestinal    Prior to Admission medications   Medication Sig Start Date End Date Taking? Authorizing Provider  ezetimibe (ZETIA) 10  MG tablet TAKE 1 TABLET (10 MG TOTAL) BY MOUTH DAILY. 12/29/21   Lorrene Reid, PA-C  hydrochlorothiazide (HYDRODIURIL) 12.5 MG tablet TAKE 1 TABLET (12.5 MG TOTAL) BY MOUTH DAILY. 01/07/22   Lorrene Reid, PA-C  HYDROcodone-acetaminophen (NORCO/VICODIN) 5-325 MG tablet Take 1 tablet by mouth every 6 (six) hours as needed for moderate pain. 11/24/21 11/24/22  Johnn Hai, PA-C  Multiple Minerals-Vitamins (CAL MAG ZINC +D3 PO) Take 1 tablet by mouth 3 (three) times daily.    [provider]  Multiple Vitamin (MULTIVITAMIN) capsule Take 1 capsule by mouth daily.    [provider]  oxyCODONE-acetaminophen (PERCOCET/ROXICET) 5-325 MG tablet Take 1 tablet by mouth every 4 (four) hours as needed for severe pain.    [provider]    Physical Exam: Constitutional: Moderately built and nourished. Vitals:   01/08/22 1830 01/08/22 1915 01/08/22 1923 01/08/22 1930  BP: (!) 140/61 120/63  (!) 127/48  Pulse: 85 89  87  Resp: (!) 25 15  17   Temp:      TempSrc:      SpO2: 94% 92%  95%  Weight:   79.4 kg   Height:   5\' 3"  (1.6 m)    Eyes: Anicteric no pallor. ENMT: No discharge from the ears eyes nose and mouth. Neck: No mass felt.  No neck rigidity. Respiratory: No rhonchi or crepitations. Cardiovascular: S1-S2 heard. Abdomen: Soft nontender bowel sound present. Musculoskeletal: No edema. Skin: No rash. Neurologic: Alert awake oriented to time place and person.  Moves all extremities. Psychiatric: Appears normal.  Normal affect.   Labs on Admission: I have personally reviewed following labs and imaging studies  CBC: Recent Labs  Lab 01/06/22 0558 01/08/22 1719  WBC 7.3 8.2  NEUTROABS  --  5.8  HGB 13.8 13.1  HCT 40.9 38.4  MCV 97.8 97.2  PLT 301 678   Basic Metabolic Panel: Recent Labs  Lab 01/06/22 0558 01/08/22 1719  NA 137 138  K 3.4* 3.7  CL 101 100  CO2 26 26  GLUCOSE 130* 108*  BUN 13 17  CREATININE 0.61 0.61  CALCIUM 9.8 9.4    GFR: Estimated Creatinine Clearance: 69.9 mL/min (by C-G formula based on SCr of 0.61 mg/dL). Liver Function Tests: No results for input(s): "AST", "ALT", "ALKPHOS", "BILITOT", "PROT", "ALBUMIN" in the last 168 hours. No results for input(s): "LIPASE", "AMYLASE" in the last 168 hours. No results for input(s): "AMMONIA" in the last 168 hours. Coagulation Profile: Recent Labs  Lab 01/08/22 1719  INR 1.0   Cardiac Enzymes: No results for input(s): "CKTOTAL", "CKMB", "CKMBINDEX", "TROPONINI" in the last 168  hours. BNP (last 3 results) No results for input(s): "PROBNP" in the last 8760 hours. HbA1C: No results for input(s): "HGBA1C" in the last 72 hours. CBG: Recent Labs  Lab 01/05/22 1201  GLUCAP 136*   Lipid Profile: No results for input(s): "CHOL", "HDL", "LDLCALC", "TRIG", "CHOLHDL", "LDLDIRECT" in the last 72 hours. Thyroid Function Tests: No results for input(s): "TSH", "T4TOTAL", "FREET4", "T3FREE", "THYROIDAB" in the last 72 hours. Anemia Panel: No results for input(s): "VITAMINB12", "FOLATE", "FERRITIN", "TIBC", "IRON", "RETICCTPCT" in the last 72 hours. Urine analysis:    Component Value Date/Time   COLORURINE YELLOW 02/08/2010 [place] 02/08/2010 2215   LABSPEC 1.016 02/08/2010 2215   PHURINE 7.0 02/08/2010 2215   GLUCOSEU NEGATIVE 02/08/2010 2215   HGBUR NEGATIVE 02/08/2010 2215   BILIRUBINUR NEGATIVE 02/08/2010 2215   KETONESUR NEGATIVE 02/08/2010 2215   PROTEINUR NEGATIVE 02/08/2010 2215   UROBILINOGEN 0.2 02/08/2010 2215   NITRITE NEGATIVE 02/08/2010 2215   LEUKOCYTESUR  02/08/2010 2215    NEGATIVE MICROSCOPIC NOT DONE ON URINES WITH NEGATIVE PROTEIN, BLOOD, LEUKOCYTES, NITRITE, OR GLUCOSE <1000 mg/dL.   Sepsis Labs: @LABRCNTIP (procalcitonin:4,lacticidven:4) ) Recent Results (from the past 240 hour(s))  SARS Coronavirus 2 (TAT 6-24 hrs)     Status: None   Collection Time: 01/02/22 12:00 AM  Result Value Ref Range Status   SARS  Coronavirus 2 RESULT: NEGATIVE  Final    Comment: RESULT: NEGATIVESARS-CoV-2 INTERPRETATION:A NEGATIVE  test result means that SARS-CoV-2 RNA was not present in the specimen above the limit of detection of this test. This does not preclude a possible SARS-CoV-2 infection and should not be used as the  sole basis for patient management decisions. Negative results must be combined with clinical observations, patient history, and epidemiological information. Optimum specimen types and timing for peak viral levels during infections caused by SARS-CoV-2  have not been determined. Collection of multiple specimens or types of specimens may be necessary to detect virus. Improper specimen collection and handling, sequence variability under primers/probes, or organism present below the limit of detection may  lead to false negative results. Positive and negative predictive values of testing are highly dependent on prevalence. False negative test results are more likely when prevalence of disease is high.The expected result is NEGATIVE.Fact S heet for  Healthcare Providers: LocalChronicle.no Sheet for Patients: SalonLookup.es Reference Range - Negative      Radiological Exams on Admission: CT Head Wo Contrast  Result Date: 01/08/2022 CLINICAL DATA:  Frontal headache, dural venous sinus thrombosis suspected EXAM: CT HEAD WITHOUT CONTRAST TECHNIQUE: Contiguous axial images were obtained from the base of the skull through the vertex without intravenous contrast. RADIATION DOSE REDUCTION: This exam was performed according to the departmental dose-optimization program which includes automated exposure control, adjustment of the mA and/or kV according to patient size and/or use of iterative reconstruction technique. COMPARISON:  MRI earlier today FINDINGS: Brain: No intracranial hemorrhage, mass effect, or evidence of acute infarct. No hydrocephalus. No  extra-axial fluid collection. Vascular: Hyperdensity within the straight sinus (series 7/image 29) compatible with thrombosis as seen on MRI earlier today. Skull: No fracture or focal lesion. Sinuses/Orbits: No acute finding. Paranasal sinuses and mastoid air cells are well aerated. Other: None. IMPRESSION: Hyperdensity in the straight sinus compatible with thrombus as seen on MRI earlier today. No evidence of hemorrhage or acute infarct. Electronically Signed   By: Placido Sou M.D.   On: 01/08/2022 18:51   MR BRAIN W WO CONTRAST  Result Date: 01/08/2022 CLINICAL DATA:  Non-small  cell lung cancer (NSCLC), staging EXAM: MRI HEAD WITHOUT AND WITH CONTRAST TECHNIQUE: Multiplanar, multiecho pulse sequences of the brain and surrounding structures were obtained without and with intravenous contrast. CONTRAST:  64mL GADAVIST GADOBUTROL 1 MMOL/ML IV SOLN COMPARISON:  None Available. FINDINGS: Brain: There is no acute infarction or intracranial hemorrhage. There is no intracranial mass, mass effect, or edema. There is no hydrocephalus or extra-axial fluid collection. Ventricles and sulci are within normal limits in size and configuration. Minimal punctate foci of T2 hyperintensity in the supratentorial white matter nonspecific but may reflect minor chronic microvascular ischemic changes. No abnormal enhancement. Vascular: There is thrombus within the proximal to mid straight sinus. Remainder of dural venous sinuses are patent. Major vessel flow voids at the skull base are preserved. Skull and upper cervical spine: Normal marrow signal is preserved. Partially imaged susceptibility artifact related to cervical spine fusion. Sinuses/Orbits: Paranasal sinuses are aerated. Orbits are unremarkable. Other: Sella is unremarkable.  Mastoid air cells are clear. IMPRESSION: Thrombus within the straight sinus. No evidence of intracranial metastatic disease. These results will be called to the ordering clinician or representative  by the Radiologist Assistant, and communication documented in the PACS or [employer]. Electronically Signed   By: Macy Mis M.D.   On: 01/08/2022 11:55      Assessment/Plan Principal Problem:   VTE (venous thromboembolism) Active Problems:   Essential hypertension    Straight sinus thrombosis in the setting of possible malignancy.  Appreciate neurology consult.  Patient has been started on Lovenox and eventually after 5 days to transition to dabigatran.  To ensure availability of Lovenox in outpatient pharmacy before discharge. Lung mass being worked up for possible malignancy results of pathology are still pending. Hypertension on hydrochlorothiazide. Hyperlipidemia on ezetimibe.   DVT prophylaxis: Lovenox for DVT dosing. Code Status: Full code. Family Communication: Husband at the bedside. Disposition Plan: Home when stable. Consults called: Neurology. Admission status: Observation.   Rise Patience MD [facility] Pager [phone].  If 7PM-7AM, please contact night-coverage [email or web address] Password [facility]  01/08/2022, 9:44 PM

## 2022-01-08 NOTE — ED Provider Triage Note (Signed)
Emergency Medicine Provider Triage Evaluation Note  Misty Decker , a 66 y.o. female  was evaluated in triage.  Pt sent in by Dr. Valeta Harms with pulmonology for abnormal MRI.  Patient was being evaluated for metastatic cancer and had MRI of the brain done today.  Results showed no evidence of metastasis or lesions but did show a venous sinus thrombosis in the straight sinus.  Patient reports that she has had a frontal headache that she had associated with being a tension headache but otherwise denies visual changes, seizures, numbness or weakness.  Review of Systems  Positive: Headache Negative: Vision changes, seizures, nausea, vomiting  Physical Exam  BP 122/68 (BP Location: Right Arm)   Pulse 84   Temp 97.8 F (36.6 C) (Oral)   Resp 18   SpO2 94%  Gen:   Awake, no distress   Resp:  Normal effort  MSK:   Moves extremities without difficulty  Other:  Cranial nerves grossly intact, no numbness or weakness in the upper and lower extremities  Medical Decision Making  Medically screening exam initiated at 5:02 PM.  Appropriate orders placed.  Misty Decker was informed that the remainder of the evaluation will be completed by another provider, this initial triage assessment does not replace that evaluation, and the importance of remaining in the ED until their evaluation is complete.  I discussed MRI results with Dr. Quinn Axe with neurology.  Patient will need acute bed so the patient to be seen and evaluated by neurology and Dr. Quinn Axe will place any further orders.   Jacqlyn Larsen, [place] 01/08/22 1732

## 2022-01-08 NOTE — Telephone Encounter (Signed)
Called patient advised of results and to go to [facility] ER for workup for Abnormal MRI with blood clot - Venous sinus Thrombosis on MRI , will need further workup  No stroke sx currently . Will have family drive her right now   Call ER triage for heads up

## 2022-01-08 NOTE — Progress Notes (Addendum)
ANTICOAGULATION CONSULT NOTE - Initial Consult  Pharmacy Consult for lovenox Indication: Venous sinus thrombus  Allergies  Allergen Reactions   Codeine     Nausea and vomiting, hallucinations, bad dreams    Morphine Shortness Of Breath    B/P dropped!   Beef-Derived Products Nausea And Vomiting    Alpha Gal   Doxycycline Nausea And Vomiting   Ibuprofen     Stomach Ulcers, stomach scarred    Iodinated Contrast Media Nausea And Vomiting    Pt reports n/v only. No other reaction noted. No need for 13 hr prep.    Latex Rash   Nsaids Other (See Comments)    PT has hx of ulcers.   Penicillins Nausea And Vomiting    hives   Pork-Derived Products Nausea And Vomiting   Tramadol     Caused RLS   Gabapentin Other (See Comments)    Pt states this made her feel like she was always depressed and not caring about anything    Patient Measurements: Height: 5\' 3"  (160 cm) Weight: 79.4 kg (175 lb) IBW/kg (Calculated) : 52.4 Heparin Dosing Weight: 69.7kg  Vital Signs: Temp: 99 F (37.2 C) (08/03 1800) Temp Source: Oral (08/03 1800) BP: 120/63 (08/03 1915) Pulse Rate: 89 (08/03 1915)  Labs: Recent Labs    01/06/22 0558 01/08/22 1719  HGB 13.8 13.1  HCT 40.9 38.4  PLT 301 289  APTT  --  28  LABPROT  --  12.9  INR  --  1.0  CREATININE 0.61 0.61    Estimated Creatinine Clearance: 69.9 mL/min (by C-G formula based on SCr of 0.61 mg/dL).   Medical History: Past Medical History:  Diagnosis Date   Complication of anesthesia    "Hard to sedate", per pt and slow to wake up   COVID 10/2020   Degenerative joint disease    s/p cervical and lumbar fusions   Depression    pt denies   Encounter for preventive health examination    Next mammogram in 07/2009. Next colonoscopy in 09/2017. DEXA 4/09>Normal   Encounter for screening colonoscopy 10/03/2007   Normal   Fatty liver    Fibromyalgia    GERD (gastroesophageal reflux disease)    Hemorrhoid    hx of   History of hiatal  hernia    Hyperlipidemia    Hypertension    Sciatica     Assessment: Misty Decker presenting with HA, outpt MRI shows straight sinus thrombosis.  She is not on anticoagulation PTA, CBC wnl  Goal of Therapy:  Anti-Xa level 0.6-1 units/ml 4hrs after LMWH dose given Monitor platelets by anticoagulation protocol: Yes   Plan:  Lovenox 1mg /kg SQ every 12 hours Monitor renal function, CBC, S/s bleeding F/u long term AC plan with stroke team  Bertis Ruddy, PharmD Clinical Pharmacist ED Pharmacist Phone # [phone] 01/08/2022 7:40 PM

## 2022-01-08 NOTE — ED Provider Notes (Signed)
[facility] EMERGENCY DEPARTMENT Provider Note   CSN: [id number] Arrival date & time: 01/08/22  1639     History  Chief Complaint  Patient presents with   Abnormal MRI    Misty Decker is a 66 y.o. female.  HPI 66 year old female who is being worked up for lung mass and metastatic cancer presents with abnormal MRI.  She has been having a "stress headache" for a few weeks.  She has chronic weakness and numbness in her legs that are attributed to spinal problems and she follows with neurosurgery.  MRI today showed straight sinus thrombosis, and she was sent to the ER.  Home Medications Prior to Admission medications   Medication Sig Start Date End Date Taking? Authorizing Provider  ezetimibe (ZETIA) 10 MG tablet TAKE 1 TABLET (10 MG TOTAL) BY MOUTH DAILY. 12/29/21   Lorrene Reid, PA-C  hydrochlorothiazide (HYDRODIURIL) 12.5 MG tablet TAKE 1 TABLET (12.5 MG TOTAL) BY MOUTH DAILY. 01/07/22   Lorrene Reid, PA-C  HYDROcodone-acetaminophen (NORCO/VICODIN) 5-325 MG tablet Take 1 tablet by mouth every 6 (six) hours as needed for moderate pain. 11/24/21 11/24/22  Johnn Hai, PA-C  Multiple Minerals-Vitamins (CAL MAG ZINC +D3 PO) Take 1 tablet by mouth 3 (three) times daily.    [provider]  Multiple Vitamin (MULTIVITAMIN) capsule Take 1 capsule by mouth daily.    [provider]  oxyCODONE-acetaminophen (PERCOCET/ROXICET) 5-325 MG tablet Take 1 tablet by mouth every 4 (four) hours as needed for severe pain.    [provider]      Allergies    Codeine, Morphine, Beef-derived products, Doxycycline, Ibuprofen, Iodinated contrast media, Latex, Nsaids, Penicillins, Pork-derived products, Tramadol, and Gabapentin    Review of Systems   Review of Systems  Eyes:  Negative for visual disturbance.  Neurological:  Positive for weakness, numbness and headaches.    Physical Exam Updated Vital Signs BP (!) 131/52   Pulse 90   Temp  98.2 F (36.8 C)   Resp 17   Ht 5\' 3"  (1.6 m)   Wt 79.4 kg   SpO2 95%   BMI 31.00 kg/m  Physical Exam Vitals and nursing note reviewed.  Constitutional:      Appearance: She is well-developed.  HENT:     Head: Normocephalic and atraumatic.  Eyes:     Extraocular Movements: Extraocular movements intact.     Pupils: Pupils are equal, round, and reactive to light.  Cardiovascular:     Rate and Rhythm: Normal rate and regular rhythm.     Heart sounds: Normal heart sounds.  Pulmonary:     Effort: Pulmonary effort is normal.     Breath sounds: Normal breath sounds.  Abdominal:     Palpations: Abdomen is soft.     Tenderness: There is no abdominal tenderness.  Skin:    General: Skin is warm and dry.  Neurological:     Mental Status: She is alert.     Comments: CN 3-12 grossly intact. 5/5 strength in all 4 extremities, thought legs are limited by pain in the back. Grossly normal sensation. Normal finger to nose.      ED Results / Procedures / Treatments   Labs (all labs ordered are listed, but only abnormal results are displayed) Labs Reviewed  BASIC METABOLIC PANEL - Abnormal; Notable for the following components:      Result Value   Glucose, Bld 108 (*)    All other components within normal limits  ANTITHROMBIN III -  Abnormal; Notable for the following components:   AntiThromb III Func 129 (*)    All other components within normal limits  CBC WITH DIFFERENTIAL/PLATELET  PROTIME-INR  APTT  PROTEIN C ACTIVITY  PROTEIN C, TOTAL  PROTEIN S ACTIVITY  PROTEIN S, TOTAL  LUPUS ANTICOAGULANT PANEL  BETA-2-GLYCOPROTEIN I ABS, IGG/M/A  HOMOCYSTEINE  FACTOR 5 LEIDEN  PROTHROMBIN GENE MUTATION  CARDIOLIPIN ANTIBODIES, IGG, IGM, IGA  HIV ANTIBODY (ROUTINE TESTING W REFLEX)  BASIC METABOLIC PANEL  CBC    EKG None  Radiology CT Head Wo Contrast  Result Date: 01/08/2022 CLINICAL DATA:  Frontal headache, dural venous sinus thrombosis suspected EXAM: CT HEAD WITHOUT  CONTRAST TECHNIQUE: Contiguous axial images were obtained from the base of the skull through the vertex without intravenous contrast. RADIATION DOSE REDUCTION: This exam was performed according to the departmental dose-optimization program which includes automated exposure control, adjustment of the mA and/or kV according to patient size and/or use of iterative reconstruction technique. COMPARISON:  MRI earlier today FINDINGS: Brain: No intracranial hemorrhage, mass effect, or evidence of acute infarct. No hydrocephalus. No extra-axial fluid collection. Vascular: Hyperdensity within the straight sinus (series 7/image 29) compatible with thrombosis as seen on MRI earlier today. Skull: No fracture or focal lesion. Sinuses/Orbits: No acute finding. Paranasal sinuses and mastoid air cells are well aerated. Other: None. IMPRESSION: Hyperdensity in the straight sinus compatible with thrombus as seen on MRI earlier today. No evidence of hemorrhage or acute infarct. Electronically Signed   By: Placido Sou M.D.   On: 01/08/2022 18:51   MR BRAIN W WO CONTRAST  Result Date: 01/08/2022 CLINICAL DATA:  Non-small cell lung cancer (NSCLC), staging EXAM: MRI HEAD WITHOUT AND WITH CONTRAST TECHNIQUE: Multiplanar, multiecho pulse sequences of the brain and surrounding structures were obtained without and with intravenous contrast. CONTRAST:  39mL GADAVIST GADOBUTROL 1 MMOL/ML IV SOLN COMPARISON:  None Available. FINDINGS: Brain: There is no acute infarction or intracranial hemorrhage. There is no intracranial mass, mass effect, or edema. There is no hydrocephalus or extra-axial fluid collection. Ventricles and sulci are within normal limits in size and configuration. Minimal punctate foci of T2 hyperintensity in the supratentorial white matter nonspecific but may reflect minor chronic microvascular ischemic changes. No abnormal enhancement. Vascular: There is thrombus within the proximal to mid straight sinus. Remainder of  dural venous sinuses are patent. Major vessel flow voids at the skull base are preserved. Skull and upper cervical spine: Normal marrow signal is preserved. Partially imaged susceptibility artifact related to cervical spine fusion. Sinuses/Orbits: Paranasal sinuses are aerated. Orbits are unremarkable. Other: Sella is unremarkable.  Mastoid air cells are clear. IMPRESSION: Thrombus within the straight sinus. No evidence of intracranial metastatic disease. These results will be called to the ordering clinician or representative by the Radiologist Assistant, and communication documented in the PACS or [employer]. Electronically Signed   By: Macy Mis M.D.   On: 01/08/2022 11:55    Procedures Procedures    Medications Ordered in ED Medications  enoxaparin (LOVENOX) injection 80 mg (80 mg Subcutaneous Given 01/08/22 2218)  ezetimibe (ZETIA) tablet 10 mg (has no administration in time range)  hydrochlorothiazide (HYDRODIURIL) tablet 12.5 mg (has no administration in time range)    ED Course/ Medical Decision Making/ A&P                           Medical Decision Making Amount and/or Complexity of Data Reviewed Labs:     Details: Normal  WBC, hemoglobin, renal function Radiology: ordered and independent interpretation performed.    Details: CT head without head bleed  Risk Decision regarding hospitalization.   Patient presents after being found to have a cerebral venous thrombosis on outpatient imaging.  No focal neurodeficits and is unclear how long this has been there.  Discussed with neurology, Dr. Lorrin Goodell, who is deciding between IV heparin versus subcutaneous anticoagulation.  Either way she will need admission and monitoring.  Discussed with Dr. Hal Hope who will admit.        Final Clinical Impression(s) / ED Diagnoses Final diagnoses:  Cerebral venous thrombosis    Rx / DC Orders ED Discharge Orders     None         Sherwood Gambler, MD 01/08/22  2326

## 2022-01-08 NOTE — Telephone Encounter (Signed)
MRI shows no evidence of metastatic disease  However does show a thrombus within the proximal to mid straight sinus remainder of venous sinuses are patent  With this patient will need further evaluation-would recommend that she go to [facility] emergency room.  We will contact them once we have heard back from patient .   Called patient left message to return call called patient's daughter and advised to call back for test results  Please try patient again

## 2022-01-08 NOTE — Consult Note (Signed)
NEUROLOGY CONSULTATION NOTE   Date of service: January 08, 2022 Patient Name: Misty Decker MRN:  [id number] DOB:  06-02-1956 Reason for consult: "sinus venous thrombosis" Requesting Provider: Sherwood Gambler, MD _ _ _   _ __   _ __ _ _  __ __   _ __   __ _  History of Present Illness  Misty Decker is a 66 y.o. female with PMH significant for GERD, fibromyalgia, hyperlipidemia, hypertension, recently discovered bronchogenic carcinoma with known lumbar osseous mets. She had MRI Brain outpatient to evaluate for brain mets and was found to have thrombus within proximal to mid straight sinus.  She was aske to come to ED for further evaluation and workup. Reports tension type headache over the last few weeks. It is a 4 out of 10, like a band around her head.  She is not someone who gets headaches.  Denies any blurred vision, headache is not worse with lying flat.  She reports pain and tingling in bilateral legs along with pain in her right thigh and pain in her lower back.  The pain and tingling is worse with movement.  She feels that there is some pain on the surface of her skin in her thighs but much of the pain is deep and feels as if her bones are hurting.   ROS   Constitutional + weight loss, but no fever and chills.  HEENT Denies changes in vision and hearing.   Respiratory Denies SOB and cough.   CV Denies palpitations and CP   GI Denies abdominal pain, nausea, vomiting and diarrhea.   GU Denies dysuria and urinary frequency.   MSK + myalgia and joint pain.   Skin Denies rash and pruritus.   Neurological + for mild headache and syncope.   Psychiatric Denies recent changes in mood. Denies anxiety and depression.    Past History   Past Medical History:  Diagnosis Date   Complication of anesthesia    "Hard to sedate", per pt and slow to wake up   COVID 10/2020   Degenerative joint disease    s/p cervical and lumbar fusions   Depression    pt denies   Encounter for  preventive health examination    Next mammogram in 07/2009. Next colonoscopy in 09/2017. DEXA 4/09>Normal   Encounter for screening colonoscopy 10/03/2007   Normal   Fatty liver    Fibromyalgia    GERD (gastroesophageal reflux disease)    Hemorrhoid    hx of   History of hiatal hernia    Hyperlipidemia    Hypertension    Sciatica    Past Surgical History:  Procedure Laterality Date   ABDOMINAL HYSTERECTOMY N/A    [employer] 11/21/2019   APPENDECTOMY N/A    [employer] 11/21/2019   BRONCHIAL NEEDLE ASPIRATION BIOPSY  01/06/2022   Procedure: BRONCHIAL NEEDLE ASPIRATION BIOPSIES;  Surgeon: Garner Nash, DO;  Location: [facility];  Service: Pulmonary;;   CERVICAL FUSION  2010   X 2   LUMBAR FUSION  1997   X 2/has plate and screws and cage   OVARIAN CYST SURGERY  1980   pelvic prolapse  1987   RECTAL PROLAPSE REPAIR     SPINE SURGERY N/A    [facility] 11/21/2019   TONSILLECTOMY     and adenoids   TOTAL ABDOMINAL HYSTERECTOMY  1987   VIDEO BRONCHOSCOPY WITH ENDOBRONCHIAL ULTRASOUND Left 01/06/2022   Procedure: VIDEO BRONCHOSCOPY WITH ENDOBRONCHIAL ULTRASOUND;  Surgeon: Garner Nash, DO;  Location:  [facility];  Service: Pulmonary;  Laterality: Left;   Family History  Problem Relation Age of Onset   Cancer Mother    Diabetes Mother    Breast cancer Mother    Heart attack Mother    Hyperlipidemia Mother    Hyperlipidemia Father    Hypertension Father    Aneurysm Father    Dementia Maternal Grandfather    Cancer Paternal Grandmother        intestinal   Social History   Socioeconomic History   Marital status: Married    Spouse name: Not on file   Number of children: Not on file   Years of education: Not on file   Highest education level: Not on file  Occupational History   Not on file  Tobacco Use   Smoking status: Former    Packs/day: 2.00    Years: 30.00    Total pack years: 60.00    Types: Cigarettes    Quit date: 04/03/2003    Years since quitting: 18.7    Smokeless tobacco: Never  Vaping Use   Vaping Use: Never used  Substance and Sexual Activity   Alcohol use: Not Currently    Alcohol/week: 0.0 standard drinks of alcohol   Drug use: No   Sexual activity: Not Currently  Other Topics Concern   Not on file  Social History Narrative   Not on file   Social Determinants of Health   Financial Resource Strain: Medium Risk (03/02/2021)   Overall Financial Resource Strain (CARDIA)    Difficulty of Paying Living Expenses: Somewhat hard  Food Insecurity: No Food Insecurity (03/02/2021)   Hunger Vital Sign    Worried About Running Out of Food in the Last Year: Never true    Ran Out of Food in the Last Year: Never true  Transportation Needs: No Transportation Needs (03/02/2021)   PRAPARE - Hydrologist (Medical): No    Lack of Transportation (Non-Medical): No  Physical Activity: Inactive (03/02/2021)   Exercise Vital Sign    Days of Exercise per Week: 0 days    Minutes of Exercise per Session: 0 min  Stress: Stress Concern Present (03/02/2021)   [place]arr    Feeling of Stress : To some extent  Social Connections: Moderately Integrated (03/02/2021)   Social Connection and Isolation Panel [NHANES]    Frequency of Communication with Friends and Family: More than three times a week    Frequency of Social Gatherings with Friends and Family: More than three times a week    Attends Religious Services: More than 4 times per year    Active Member of [employer] or Organizations: No    Attends Archivist Meetings: Never    Marital Status: Married   Allergies  Allergen Reactions   Codeine     Nausea and vomiting, hallucinations, bad dreams    Morphine Shortness Of Breath    B/P dropped!   Beef-Derived Products Nausea And Vomiting    Alpha Gal   Doxycycline Nausea And Vomiting   Ibuprofen     Stomach Ulcers, stomach scarred    Iodinated Contrast  Media Nausea And Vomiting    Pt reports n/v only. No other reaction noted. No need for 13 hr prep.    Latex Rash   Nsaids Other (See Comments)    PT has hx of ulcers.   Penicillins Nausea And Vomiting    hives   Pork-Derived Products Nausea  And Vomiting   Tramadol     Caused RLS   Gabapentin Other (See Comments)    Pt states this made her feel like she was always depressed and not caring about anything    Medications  (Not in a hospital admission)    Vitals   Vitals:   01/08/22 1830 01/08/22 1915 01/08/22 1923 01/08/22 1930  BP: (!) 140/61 120/63  (!) 127/48  Pulse: 85 89  87  Resp: (!) 25 15  17   Temp:      TempSrc:      SpO2: 94% 92%  95%  Weight:   79.4 kg   Height:   5\' 3"  (1.6 m)      Body mass index is 31 kg/m.  Physical Exam   General: Laying comfortably in bed; in no acute distress.  HENT: Normal oropharynx and mucosa. Normal external appearance of ears and nose.  Neck: Supple, no pain or tenderness  CV: No JVD. No peripheral edema.  Pulmonary: Symmetric Chest rise. Normal respiratory effort.  Abdomen: Soft to touch, non-tender.  Ext: No cyanosis, edema, or deformity  Skin: No rash. Normal palpation of skin.   Musculoskeletal: Normal digits and nails by inspection. No clubbing.   Neurologic Examination  Mental status/Cognition: Alert, oriented to self, place, month and year, good attention.  Speech/language: Fluent, comprehension intact, object naming intact, repetition intact.  Cranial nerves:   CN II Pupils equal and reactive to light, no VF deficits    CN III,IV,VI EOM intact, no gaze preference or deviation, no nystagmus    CN V normal sensation in V1, V2, and V3 segments bilaterally    CN VII no asymmetry, no nasolabial fold flattening    CN VIII normal hearing to speech    CN IX & X normal palatal elevation, no uvular deviation    CN XI 5/5 head turn and 5/5 shoulder shrug bilaterally    CN XII midline tongue protrusion   Motor:  Muscle bulk:  normal, tone normal, pronator drift none tremor none Mvmt Root Nerve  Muscle Right Left Comments  SA C5/6 Ax Deltoid 5 5   EF C5/6 Mc Biceps 5 5   EE C6/7/8 Rad Triceps 5 5   WF C6/7 Med FCR     WE C7/8 PIN ECU     F Ab C8/T1 U ADM/FDI 5 5   HF L1/2/3 Fem Illopsoas 4 4 Strength testing in lower extremities is significantly limited by pain  KE L2/3/4 Fem Quad 4 4   DF L4/5 D Peron Tib Ant 4+ 4+   PF S1/2 Tibial Grc/Sol 4+ 4+    Reflexes:  Right Left Comments  Pectoralis      Biceps (C5/6) 2 2   Brachioradialis (C5/6) 2 2    Triceps (C6/7) 2 2    Patellar (L3/4) 2 2    Achilles (S1) 1 1    Hoffman      Plantar     Jaw jerk    Sensation:  Light touch Intact throughout   Pin prick    Temperature    Vibration   Proprioception    Coordination/Complex Motor:  - Finger to Nose intact bilaterally - Heel to shin unable to do due to pain. - Rapid alternating movement are normal - Gait: Deferred due to significant leg pain. Labs   CBC:  Recent Labs  Lab 01/06/22 0558 01/08/22 1719  WBC 7.3 8.2  NEUTROABS  --  5.8  HGB 13.8 13.1  HCT 40.9  38.4  MCV 97.8 97.2  PLT 301 003    Basic Metabolic Panel:  Lab Results  Component Value Date   NA 138 01/08/2022   K 3.7 01/08/2022   CO2 26 01/08/2022   GLUCOSE 108 (H) 01/08/2022   BUN 17 01/08/2022   CREATININE 0.61 01/08/2022   CALCIUM 9.4 01/08/2022   GFRNONAA >60 01/08/2022   GFRAA 87 05/23/2020   Lipid Panel:  Lab Results  Component Value Date   LDLCALC 136 (H) 07/07/2021   HgbA1c:  Lab Results  Component Value Date   HGBA1C 5.5 12/10/2020   Urine Drug Screen: No results found for: "LABOPIA", "COCAINSCRNUR", "LABBENZ", "AMPHETMU", "THCU", "LABBARB"  Alcohol Level No results found for: "ETH"  CT Head without contrast(Personally reviewed): CTH was negative for a large hypodensity concerning for a large territory infarct or hyperdensity concerning for an ICH  MRI Brain(Personally reviewed): Thrombus within  the straight sinus.  Impression   Elizabella May Conly is a 66 y.o. female with PMH significant for GERD, fibromyalgia, hyperlipidemia, hypertension, recently discovered bronchogenic carcinoma with known lumbar osseous mets. She had MRI Brain outpatient to evaluate for brain mets and was found to have thrombus within proximal to mid straight sinus.  Likely due to hypercoagulability in the setting of cancer. Her neurologic examination is notable for no focal deficit.  She does not seem to have symptoms of a pressure headache, no blurred vision.  Does report tension type headache for the last few weeks but the headache is mild and manageable.  Recommendations  - Recommend subcutaneous full dose Lovenox x 5 days for acute treatment of CVST, followed by Dabigatran 150mg  BID from Day 6 and onwards. - Stroke team to follow. - Would recommend making sure that the outpatient pharmacy has Lovenox shots ready and available for pickup before discharging patient. - management of rest of the comorbidities per primary team. ______________________________________________________________________   Thank you for the opportunity to take part in the care of this patient. If you have any further questions, please contact the neurology consultation attending.  Signed,  [place] Pager Number [phone] _ _ _   _ __   _ __ _ _  __ __   _ __   __ _

## 2022-01-09 ENCOUNTER — Ambulatory Visit
Admission: RE | Admit: 2022-01-09 | Discharge: 2022-01-09 | Disposition: A | Discharge status: Home / Self Care | Payer: Medicare Other | Source: Ambulatory Visit | Attending: Radiation Oncology | Admitting: Radiation Oncology

## 2022-01-09 ENCOUNTER — Other Ambulatory Visit: Payer: Self-pay

## 2022-01-09 ENCOUNTER — Ambulatory Visit
Admit: 2022-01-09 | Discharge: 2022-01-09 | Disposition: A | Discharge status: Home / Self Care | Payer: Medicare Other | Attending: Radiation Oncology | Admitting: Radiation Oncology

## 2022-01-09 ENCOUNTER — Inpatient Hospital Stay (HOSPITAL_COMMUNITY): Payer: Medicare Other

## 2022-01-09 ENCOUNTER — Ambulatory Visit: Payer: Medicare Other

## 2022-01-09 ENCOUNTER — Encounter (HOSPITAL_COMMUNITY): Payer: Self-pay

## 2022-01-09 DIAGNOSIS — C7951 Secondary malignant neoplasm of bone: Secondary | ICD-10-CM | POA: Diagnosis present

## 2022-01-09 DIAGNOSIS — I8290 Acute embolism and thrombosis of unspecified vein: Secondary | ICD-10-CM | POA: Diagnosis not present

## 2022-01-09 DIAGNOSIS — C3490 Malignant neoplasm of unspecified part of unspecified bronchus or lung: Secondary | ICD-10-CM | POA: Diagnosis not present

## 2022-01-09 DIAGNOSIS — I669 Occlusion and stenosis of unspecified cerebral artery: Secondary | ICD-10-CM | POA: Diagnosis not present

## 2022-01-09 DIAGNOSIS — G893 Neoplasm related pain (acute) (chronic): Secondary | ICD-10-CM | POA: Diagnosis not present

## 2022-01-09 DIAGNOSIS — R52 Pain, unspecified: Secondary | ICD-10-CM | POA: Diagnosis not present

## 2022-01-09 DIAGNOSIS — E785 Hyperlipidemia, unspecified: Secondary | ICD-10-CM | POA: Diagnosis present

## 2022-01-09 DIAGNOSIS — Z91041 Radiographic dye allergy status: Secondary | ICD-10-CM | POA: Diagnosis not present

## 2022-01-09 DIAGNOSIS — Z885 Allergy status to narcotic agent status: Secondary | ICD-10-CM | POA: Diagnosis not present

## 2022-01-09 DIAGNOSIS — Z8616 Personal history of COVID-19: Secondary | ICD-10-CM | POA: Diagnosis not present

## 2022-01-09 DIAGNOSIS — Z803 Family history of malignant neoplasm of breast: Secondary | ICD-10-CM | POA: Diagnosis not present

## 2022-01-09 DIAGNOSIS — Z515 Encounter for palliative care: Secondary | ICD-10-CM | POA: Diagnosis not present

## 2022-01-09 DIAGNOSIS — Z6834 Body mass index (BMI) 34.0-34.9, adult: Secondary | ICD-10-CM | POA: Diagnosis not present

## 2022-01-09 DIAGNOSIS — K760 Fatty (change of) liver, not elsewhere classified: Secondary | ICD-10-CM | POA: Diagnosis present

## 2022-01-09 DIAGNOSIS — C3492 Malignant neoplasm of unspecified part of left bronchus or lung: Secondary | ICD-10-CM | POA: Diagnosis not present

## 2022-01-09 DIAGNOSIS — R339 Retention of urine, unspecified: Secondary | ICD-10-CM | POA: Diagnosis present

## 2022-01-09 DIAGNOSIS — Z880 Allergy status to penicillin: Secondary | ICD-10-CM | POA: Diagnosis not present

## 2022-01-09 DIAGNOSIS — Z83438 Family history of other disorder of lipoprotein metabolism and other lipidemia: Secondary | ICD-10-CM | POA: Diagnosis not present

## 2022-01-09 DIAGNOSIS — R918 Other nonspecific abnormal finding of lung field: Secondary | ICD-10-CM

## 2022-01-09 DIAGNOSIS — C419 Malignant neoplasm of bone and articular cartilage, unspecified: Secondary | ICD-10-CM | POA: Diagnosis not present

## 2022-01-09 DIAGNOSIS — G08 Intracranial and intraspinal phlebitis and thrombophlebitis: Secondary | ICD-10-CM | POA: Diagnosis present

## 2022-01-09 DIAGNOSIS — E669 Obesity, unspecified: Secondary | ICD-10-CM | POA: Diagnosis present

## 2022-01-09 DIAGNOSIS — Z8249 Family history of ischemic heart disease and other diseases of the circulatory system: Secondary | ICD-10-CM | POA: Diagnosis not present

## 2022-01-09 DIAGNOSIS — Z87891 Personal history of nicotine dependence: Secondary | ICD-10-CM | POA: Diagnosis not present

## 2022-01-09 DIAGNOSIS — Z981 Arthrodesis status: Secondary | ICD-10-CM | POA: Diagnosis not present

## 2022-01-09 DIAGNOSIS — Z886 Allergy status to analgesic agent status: Secondary | ICD-10-CM | POA: Diagnosis not present

## 2022-01-09 DIAGNOSIS — I10 Essential (primary) hypertension: Secondary | ICD-10-CM | POA: Diagnosis present

## 2022-01-09 DIAGNOSIS — Z79899 Other long term (current) drug therapy: Secondary | ICD-10-CM | POA: Diagnosis not present

## 2022-01-09 DIAGNOSIS — M797 Fibromyalgia: Secondary | ICD-10-CM | POA: Diagnosis present

## 2022-01-09 DIAGNOSIS — C3412 Malignant neoplasm of upper lobe, left bronchus or lung: Secondary | ICD-10-CM | POA: Diagnosis present

## 2022-01-09 DIAGNOSIS — Z91040 Latex allergy status: Secondary | ICD-10-CM | POA: Diagnosis not present

## 2022-01-09 DIAGNOSIS — Z7189 Other specified counseling: Secondary | ICD-10-CM | POA: Diagnosis not present

## 2022-01-09 DIAGNOSIS — Z833 Family history of diabetes mellitus: Secondary | ICD-10-CM | POA: Diagnosis not present

## 2022-01-09 LAB — BASIC METABOLIC PANEL
Anion gap: 14 (ref 5–15)
BUN: 13 mg/dL (ref 8–23)
CO2: 22 mmol/L (ref 22–32)
Calcium: 9.7 mg/dL (ref 8.9–10.3)
Chloride: 102 mmol/L (ref 98–111)
Creatinine, Ser: 0.55 mg/dL (ref 0.44–1.00)
GFR, Estimated: >60 mL/min (ref >60)
Glucose, Bld: 117 mg/dL — ABNORMAL HIGH (ref 70–99)
Potassium: 3.6 mmol/L (ref 3.5–5.1)
Sodium: 138 mmol/L (ref 135–145)

## 2022-01-09 LAB — CBC
HCT: 38.6 % (ref 36.0–46.0)
Hemoglobin: 13.1 g/dL (ref 12.0–15.0)
MCH: 32.7 pg (ref 26.0–34.0)
MCHC: 33.9 g/dL (ref 30.0–36.0)
MCV: 96.3 fL (ref 80.0–100.0)
Platelets: 284 10*3/uL (ref 150–400)
RBC: 4.01 MIL/uL (ref 3.87–5.11)
RDW: 12.8 % (ref 11.5–15.5)
WBC: 8.8 10*3/uL (ref 4.0–10.5)
nRBC: 0 % (ref 0.0–0.2)

## 2022-01-09 LAB — PROTEIN C ACTIVITY: Protein C Activity: 193 % — ABNORMAL HIGH (ref 73–180)

## 2022-01-09 LAB — LUPUS ANTICOAGULANT PANEL
DRVVT: 33.2 s (ref 0.0–47.0)
PTT Lupus Anticoagulant: 32.7 s (ref 0.0–43.5)

## 2022-01-09 LAB — CARDIOLIPIN ANTIBODIES, IGG, IGM, IGA
Anticardiolipin IgA: <9 APL U/mL (ref 0–11)
Anticardiolipin IgG: <9 GPL U/mL (ref 0–14)
Anticardiolipin IgM: <9 MPL U/mL (ref 0–12)

## 2022-01-09 LAB — PROTEIN S ACTIVITY: Protein S Activity: 94 % (ref 63–140)

## 2022-01-09 LAB — HIV ANTIBODY (ROUTINE TESTING W REFLEX): HIV Screen 4th Generation wRfx: NONREACTIVE

## 2022-01-09 LAB — CYTOLOGY - NON PAP

## 2022-01-09 LAB — HOMOCYSTEINE: Homocysteine: 14.2 umol/L (ref 0.0–17.2)

## 2022-01-09 LAB — PROTEIN S, TOTAL: Protein S Ag, Total: 108 % (ref 60–150)

## 2022-01-09 MED ORDER — ENOXAPARIN SODIUM 80 MG/0.8ML IJ SOSY: 80.0000 mg | PREFILLED_SYRINGE | Freq: Two times a day (BID) | INTRAMUSCULAR | 0 refills | Status: DC

## 2022-01-09 MED ORDER — ORAL CARE MOUTH RINSE: 15.0000 mL | OROMUCOSAL | Status: DC | PRN

## 2022-01-09 MED ORDER — FENTANYL CITRATE PF 50 MCG/ML IJ SOSY
12.5000 ug | PREFILLED_SYRINGE | Freq: Four times a day (QID) | INTRAMUSCULAR | Status: DC | PRN
  Administered 2022-01-09 – 2022-01-10 (×5): 12.5 ug via INTRAVENOUS
  Filled 2022-01-09 (×5): qty 1

## 2022-01-09 MED ORDER — OXYCODONE HCL 5 MG PO TABS: 5.0000 mg | ORAL_TABLET | Freq: Four times a day (QID) | ORAL | Status: DC | PRN

## 2022-01-09 MED ORDER — OXYCODONE HCL 5 MG PO TABS
10.0000 mg | ORAL_TABLET | Freq: Four times a day (QID) | ORAL | Status: DC | PRN
  Administered 2022-01-09 – 2022-01-13 (×12): 10 mg via ORAL
  Filled 2022-01-09 (×14): qty 2

## 2022-01-09 MED ORDER — MORPHINE SULFATE (PF) 2 MG/ML IV SOLN: 1.0000 mg | Freq: Four times a day (QID) | INTRAVENOUS | Status: DC | PRN

## 2022-01-09 MED ORDER — ONDANSETRON HCL 8 MG PO TABS
8.0000 mg | ORAL_TABLET | Freq: Three times a day (TID) | ORAL | Status: DC | PRN
  Administered 2022-01-09: 8 mg via ORAL
  Filled 2022-01-09: qty 2

## 2022-01-09 NOTE — ED Notes (Signed)
Pt transported to CT via stretcher.  

## 2022-01-09 NOTE — Progress Notes (Signed)
I received a sign out on this patient regarding the transfer to [facility]. Also was notified by Neurology, Dr Leonie Man who recommended Lovenox to Coumadin bridge until indicated otherwise by Oncology.  For now we will await Ortho recs on possible surgery, and if no further surgery is planned we can ridge Lovenox to coumadin, and have her follow up outpatient with oncology.   Gerlean Ren MD

## 2022-01-09 NOTE — ED Notes (Signed)
Pt reports nausea. MD notified.

## 2022-01-09 NOTE — ED Notes (Signed)
[facility] called for transport to [facility] radiation oncology.

## 2022-01-09 NOTE — Progress Notes (Addendum)
STROKE TEAM PROGRESS NOTE   INTERVAL HISTORY Patient is seen in her room with her daughter at the bedside.  She presented to the hospital after an outpatient brain MRI found straight venous sinus thrombosis.  She reports no symptoms.  She has recently been diagnosed with bronchogenic carcinoma with bone mets and is planning to see an oncologist regarding this within the next few days.  Vitals:   01/09/22 0900 01/09/22 1000 01/09/22 1015 01/09/22 1048  BP: (!) 119/56 92/61 127/63   Pulse: 93 92 90   Resp: _0 Temp:    98.2 F (36.8 C)  TempSrc:    Oral  SpO2: 95% 95% 97%   Weight:      Height:       CBC:  Recent Labs  Lab 01/08/22 1719 01/09/22 0302  WBC 8.2 8.8  NEUTROABS 5.8  --   HGB 13.1 13.1  HCT 38.4 38.6  MCV 97.2 96.3  PLT 289 213   Basic Metabolic Panel:  Recent Labs  Lab 01/08/22 1719 01/09/22 0302  NA 138 138  K 3.7 3.6  CL 100 102  CO2 26 22  GLUCOSE 108* 117*  BUN 17 13  CREATININE 0.61 0.55  CALCIUM 9.4 9.7   Lipid Panel: No results for input(s): "CHOL", "TRIG", "HDL", "CHOLHDL", "VLDL", "LDLCALC" in the last 168 hours. HgbA1c: No results for input(s): "HGBA1C" in the last 168 hours. Urine Drug Screen: No results for input(s): "LABOPIA", "COCAINSCRNUR", "LABBENZ", "AMPHETMU", "THCU", "LABBARB" in the last 168 hours.  Alcohol Level No results for input(s): "ETH" in the last 168 hours.  IMAGING past 24 hours CT FEMUR LEFT WO CONTRAST  Result Date: 01/09/2022 CLINICAL DATA:  Metastatic disease evaluation. Bilateral femur. Left leg pain. Primary bronchogenic carcinoma. EXAM: CT OF THE LOWER LEFT EXTREMITY WITHOUT CONTRAST TECHNIQUE: Multidetector CT imaging of the lower left extremity was performed according to the standard protocol. RADIATION DOSE REDUCTION: This exam was performed according to the departmental dose-optimization program which includes automated exposure control, adjustment of the mA and/or kV according to patient size and/or use of  iterative reconstruction technique. COMPARISON:  PET-CT 01/05/2022 FINDINGS: Bones/Joint/Cartilage Note is made on recent PET-CT of numerous hypermetabolic lytic bone metastases within the axial and appendicular skeleton. On the current CT, there are numerous lytic lesions seen throughout the visualized pelvis. A reference predominantly lytic lesion within the posterior left ilium was more completely seen on recent PET-CT and measures up to approximately 2.5 x 5.0 cm in greatest transverse by AP dimensions (axial series 3, image 1). There is high-grade thinning of the posteromedial iliac cortex (axial image 1). Superior lateral left acetabular subcortical lytic lesion measures up to approximately 1.1 x 1.8 x 1.9 cm (transverse by AP by craniocaudal) with abutment of the lateral cortex but no definitive breakthrough (axial series 2, image 32 and coronal series 7, image 56). There areas of patchy lytic lucency sclerosis within the left femoral head suspicious for metastatic disease involvement. Within the limitations of diffuse decreased bone mineralization, no definitive destructive lytic lesion is seen within the left intertrochanteric more distal left femur. Moderate medial compartment of the knee joint space narrowing with subchondral sclerosis and tiny peripheral medial ossicle degenerative change. Ligaments Suboptimally assessed by CT. Muscles and Tendons Mild fatty infiltration of the proximal anterior left gluteus minimus muscle body. No gross tendon tear is seen within the visualized left thigh. Soft tissues No knee joint effusion.  No soft tissue fluid collection. IMPRESSION: 1. Numerous lytic  metastases within the visualized pelvis as on prior PET-CT. 2. Dominant partially visualized posterior left iliac lesion that was hypermetabolic on prior PET-CT measures up to approximately 2.5 x 5.0 cm in greatest transverse by AP dimensions. This causes high-grade thinning of the posteromedial iliac cortex. 3.  Smaller superolateral left acetabular 1.9 cm lytic lesion abuts the lateral cortex without definite cortical breakthrough. Electronically Signed   By: Yvonne Kendall M.D.   On: 01/09/2022 13:51   CT FEMUR RIGHT WO CONTRAST  Result Date: 01/09/2022 CLINICAL DATA:  Metastatic disease evaluation. Bilateral femur. Left leg pain. Primary bronchogenic carcinoma. EXAM: CT OF THE LOWER RIGHT EXTREMITY WITHOUT CONTRAST TECHNIQUE: Multidetector CT imaging of the right lower extremity was performed according to the standard protocol. RADIATION DOSE REDUCTION: This exam was performed according to the departmental dose-optimization program which includes automated exposure control, adjustment of the mA and/or kV according to patient size and/or use of iterative reconstruction technique. COMPARISON:  PET-CT through the bilateral proximal femurs 01/05/2022 FINDINGS: Bones/Joint/Cartilage There again diffuse lytic metastases, seen on prior recent PET-CT to be hypermetabolic and to involve the appendicular axial skeleton. Reference lytic lesions include right iliac 1.7 x 1.3 cm lytic lesion (axial series 3, image 8) causing thinning of the anterior iliac cortex (greatest on axial series 2, image 6 where there is likely minimal pathologic breakthrough of the cortex). Right distal femoral neck and intertrochanteric to lesser trochanteric region of the right femur lucent lesion measuring up to approximately 4.1 by 2.8 by 5.1 cm (transverse by AP by craniocaudal, axial series 2, image 92 and coronal series 7, image 76). This thins the posterior distal femoral neck and the posterior lesser trochanteric cortex (axial series 3, images 84 through 91 with areas of complete cortical breakthrough. No definitive metastatic lesion is seen within the more distal right femur, however diffuse decreased bone mineralization does limit sensitivity. Ligaments Suboptimally assessed by CT. Muscles and Tendons Normal density in size of the region of the  right thigh musculature. No gross tendon tear is seen. Soft tissues No soft tissue fluid collection is seen. Mild atherosclerotic calcifications are partially visualized within the pelvis. IMPRESSION: 1. Diffuse lytic metastases are again seen throughout the pelvis and proximal right femur. 2. Dominant right distal inferior femoral neck, intertrochanteric and lesser trochanteric lytic lesion again thins the medial cortex and appears to minimally breakthrough the posterior lesser trochanteric cortex. This increases susceptibility for risk of pathologic fracture of the Mirels classification system given lower limb location, trochanteric region location, involving greater than 2/3 of the transverse bone diameter, and lytic quality. Recommend clinical evaluation for possible functional pain to complete this Mirels classification assessment. Electronically Signed   By: Yvonne Kendall M.D.   On: 01/09/2022 13:41   CT Head Wo Contrast  Result Date: 01/08/2022 CLINICAL DATA:  Frontal headache, dural venous sinus thrombosis suspected EXAM: CT HEAD WITHOUT CONTRAST TECHNIQUE: Contiguous axial images were obtained from the base of the skull through the vertex without intravenous contrast. RADIATION DOSE REDUCTION: This exam was performed according to the departmental dose-optimization program which includes automated exposure control, adjustment of the mA and/or kV according to patient size and/or use of iterative reconstruction technique. COMPARISON:  MRI earlier today FINDINGS: Brain: No intracranial hemorrhage, mass effect, or evidence of acute infarct. No hydrocephalus. No extra-axial fluid collection. Vascular: Hyperdensity within the straight sinus (series 7/image 29) compatible with thrombosis as seen on MRI earlier today. Skull: No fracture or focal lesion. Sinuses/Orbits: No acute finding. Paranasal sinuses and  mastoid air cells are well aerated. Other: None. IMPRESSION: Hyperdensity in the straight sinus compatible  with thrombus as seen on MRI earlier today. No evidence of hemorrhage or acute infarct. Electronically Signed   By: Placido Sou M.D.   On: 01/08/2022 18:51    PHYSICAL EXAM General:  Alert, well-nourished, well-developed patient in no acute distress Respiratory:  Regular, unlabored respirations on room air  NEURO:  Mental Status: AA&Ox3  Speech/Language: speech is without dysarthria or aphasia.  Fluency, and comprehension intact.  Cranial Nerves:  II: PERRL.  III, IV, VI: EOMI. Eyelids elevate symmetrically.  V: Sensation is intact to light touch and symmetrical to face.  VII: Smile is symmetrical.   VIII: hearing intact to voice. IX, X: Phonation is normal.  XII: tongue is midline without fasciculations. Motor: 5/5 strength to all muscle groups tested.  Tone: is normal and bulk is normal Sensation- Intact to light touch bilaterally.  Coordination: FTN intact bilaterally.No drift.  Gait- deferred   ASSESSMENT/PLAN Ms. Joyell May Adolph is a 67 y.o. female with history of GERD, fibromyalgia, HLD, HTN and bronchogenic carcinoma with bone mets presenting after an outpatient brain MRI found straight venous sinus thrombosis.  She reports no symptoms.  Will start full anticoagulation with lovenox now, with transition to Pradaxa or warfarin per oncology recommendations.  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 Yes; Fluid consistency: Thin   No antithrombotic prior to admission, now on  full dose enoxaparin .  Therapy recommendations:  pending Disposition:  pending  Hypertension Home meds:  HCTZ 12.5 mg daily Stable Permissive  hypertension (OK if < 220/120) but gradually normalize in 5-7 days Long-term BP goal normotensive  Hyperlipidemia Home meds:  ezetemibe 10 mg daily LDL pending, goal < 70 Continue statin at discharge  Other Stroke Risk Factors Advanced Age >/= 92  Former Cigarette smoker Obesity, Body mass index is 31 kg/m., BMI >/= 30 associated with increased stroke risk, discussion with oncologist before making a final decision.  Also recommend aggressive hydration.  No further stroke work-up is necessary weight loss, diet and exercise as appropriate   Bronchogenic lung carcinoma with bone mets No brain mets per outpatient MRI Treatment per oncology Orthopedic consult for right femur met  Other Active Problems none  Hospital day # [place] , MSN, AGACNP-BC [facility] See Amion for schedule and pager information 01/09/2022 2:50 PM   STROKE MD NOTE :  I have personally obtained history,examined this patient, reviewed notes, independently viewed imaging studies, participated in medical decision making and plan of care.ROS completed by me personally and pertinent positives fully documented  I have made any additions or clarifications directly to the above note. Agree with note above.  Patient with recently diagnosed bronchogenic carcinoma with bone mets complain of mild headache without any focal neurological symptoms or deficits on exam with CT scan and subsequently MRI straight sinus thrombosis without any significant cytotoxic edema or extension into the brain parenchyma.  Most likely etiology of this is hypercoagulability in the setting of lung cancer and is agree with long-term anticoagulation.  My preference would be for warfarin with overlap with full dose Lovenox  till INR is therapeutic.  Pradaxa may alternatively be used if the oncologist prefer.  Maintain aggressive hydration status during plenty of fluids.  No further stroke related work-up is necessary.  Discussed  with patient and daughter and answered questions.  Discussed with Dr. Reesa Chew.  Greater than 50% time during this 50-minute visit was spent in counseling and coordination of care about.  Improvement in sinus thrombosis and discussion about treatment in relationship with lung cancer and answered questions.  Antony Contras, MD Medical Director [place] Pager: [phone] 01/09/2022 5:21 PM   To contact Stroke Continuity provider, please refer to [email or web address] After hours, contact General Neurology

## 2022-01-09 NOTE — Consult Note (Addendum)
Reason for Consult:Right femur lesion Referring Physician: Vanna Scotland Time called: [id number] Time at bedside: [name] is an 66 y.o. female.  HPI: Misty Decker was recently diagnosed with lung ca. PET scan showed a metastasis in the right femur and orthopedic surgery is consulted. She does c/o pain in that hip though much more so in the left and she attributes that to her back issues. She's been having said pain for a long time.  Past Medical History:  Diagnosis Date   Complication of anesthesia    "Hard to sedate", per pt and slow to wake up   COVID 10/2020   Degenerative joint disease    s/p cervical and lumbar fusions   Depression    pt denies   Encounter for preventive health examination    Next mammogram in 07/2009. Next colonoscopy in 09/2017. DEXA 4/09>Normal   Encounter for screening colonoscopy 10/03/2007   Normal   Fatty liver    Fibromyalgia    GERD (gastroesophageal reflux disease)    Hemorrhoid    hx of   History of hiatal hernia    Hyperlipidemia    Hypertension    Sciatica     Past Surgical History:  Procedure Laterality Date   ABDOMINAL HYSTERECTOMY N/A    [facility]reesia 11/21/2019   APPENDECTOMY N/A    [facility]reesia 11/21/2019   BRONCHIAL NEEDLE ASPIRATION BIOPSY  01/06/2022   Procedure: BRONCHIAL NEEDLE ASPIRATION BIOPSIES;  Surgeon: Garner Nash, DO;  Location: [facility];  Service: Pulmonary;;   CERVICAL FUSION  2010   X 2   LUMBAR FUSION  1997   X 2/has plate and screws and cage   OVARIAN CYST SURGERY  1980   pelvic prolapse  1987   RECTAL PROLAPSE REPAIR     SPINE SURGERY N/A    [facility]ia 11/21/2019   TONSILLECTOMY     and adenoids   TOTAL ABDOMINAL HYSTERECTOMY  1987   VIDEO BRONCHOSCOPY WITH ENDOBRONCHIAL ULTRASOUND Left 01/06/2022   Procedure: VIDEO BRONCHOSCOPY WITH ENDOBRONCHIAL ULTRASOUND;  Surgeon: Garner Nash, DO;  Location: [facility];  Service: Pulmonary;  Laterality: Left;    Family History  Problem Relation Age of Onset    Cancer Mother    Diabetes Mother    Breast cancer Mother    Heart attack Mother    Hyperlipidemia Mother    Hyperlipidemia Father    Hypertension Father    Aneurysm Father    Dementia Maternal Grandfather    Cancer Paternal Grandmother        intestinal    Social History:  reports that she quit smoking about 18 years ago. Her smoking use included cigarettes. She has a 60.00 pack-year smoking history. She has never used smokeless tobacco. She reports that she does not currently use alcohol. She reports that she does not use drugs.  Allergies:  Allergies  Allergen Reactions   Codeine     Nausea and vomiting, hallucinations, bad dreams    Morphine Shortness Of Breath    B/P dropped!   Beef-Derived Products Nausea And Vomiting    Alpha Gal   Doxycycline Nausea And Vomiting   Ibuprofen     Stomach Ulcers, stomach scarred    Iodinated Contrast Media Nausea And Vomiting    Pt reports n/v only. No other reaction noted. No need for 13 hr prep.    Latex Rash   Nsaids Other (See Comments)    PT has hx of ulcers.   Penicillins Nausea And Vomiting  hives   Pork-Derived Products Nausea And Vomiting   Tramadol     Caused RLS   Gabapentin Other (See Comments)    Pt states this made her feel like she was always depressed and not caring about anything    Medications: I have reviewed the patient's current medications.  Results for orders placed or performed during the hospital encounter of 01/08/22 (from the past 48 hour(s))  Basic metabolic panel     Status: Abnormal   Collection Time: 01/08/22  5:19 PM  Result Value Ref Range   Sodium 138 135 - 145 mmol/L   Potassium 3.7 3.5 - 5.1 mmol/L   Chloride 100 98 - 111 mmol/L   CO2 26 22 - 32 mmol/L   Glucose, Bld 108 (H) 70 - 99 mg/dL    Comment: Glucose reference range applies only to samples taken after fasting for at least 8 hours.   BUN 17 8 - 23 mg/dL   Creatinine, Ser 0.61 0.44 - 1.00 mg/dL   Calcium 9.4 8.9 - 10.3 mg/dL    GFR, Estimated >60 >60 mL/min    Comment: (NOTE) Calculated using the CKD-EPI Creatinine Equation (2021)    Anion gap 12 5 - 15    Comment: Performed at [facility] [address], [place], [place] [address]  CBC with Differential     Status: None   Collection Time: 01/08/22  5:19 PM  Result Value Ref Range   WBC 8.2 4.0 - 10.5 K/uL   RBC 3.95 3.87 - 5.11 MIL/uL   Hemoglobin 13.1 12.0 - 15.0 g/dL   HCT 38.4 36.0 - 46.0 %   MCV 97.2 80.0 - 100.0 fL   MCH 33.2 26.0 - 34.0 pg   MCHC 34.1 30.0 - 36.0 g/dL   RDW 12.9 11.5 - 15.5 %   Platelets 289 150 - 400 K/uL   nRBC 0.0 0.0 - 0.2 %   Neutrophils Relative % 70 %   Neutro Abs 5.8 1.7 - 7.7 K/uL   Lymphocytes Relative 18 %   Lymphs Abs 1.5 0.7 - 4.0 K/uL   Monocytes Relative 8 %   Monocytes Absolute 0.6 0.1 - 1.0 K/uL   Eosinophils Relative 2 %   Eosinophils Absolute 0.2 0.0 - 0.5 K/uL   Basophils Relative 1 %   Basophils Absolute 0.1 0.0 - 0.1 K/uL   Immature Granulocytes 1 %   Abs Immature Granulocytes 0.05 0.00 - 0.07 K/uL    Comment: Performed at [facility], [address] [place], [place] [address]  Protime-INR     Status: None   Collection Time: 01/08/22  5:19 PM  Result Value Ref Range   Prothrombin Time 12.9 11.4 - 15.2 seconds   INR 1.0 0.8 - 1.2    Comment: (NOTE) INR goal varies based on device and disease states. Performed at [facility], [place] [address], [place], [place] [address]   APTT     Status: None   Collection Time: 01/08/22  5:19 PM  Result Value Ref Range   aPTT 28 24 - 36 seconds    Comment: Performed at [facility] [address] [place], [place] [address]  Antithrombin III     Status: Abnormal   Collection Time: 01/08/22  6:15 PM  Result Value Ref Range   AntiThromb III Func 129 (H) 75 - 120 %    Comment: Performed at [facility] [address] [place], [place] [address]  HIV Antibody (routine testing w rflx)     Status: None   Collection Time: 01/09/22   3:02 AM  Result Value Ref Range   HIV Screen 4th Generation wRfx Non Reactive Non Reactive    Comment: Performed at [facility], [place] [address], [place], [place] [address]  Basic metabolic panel     Status: Abnormal   Collection Time: 01/09/22  3:02 AM  Result Value Ref Range   Sodium 138 135 - 145 mmol/L   Potassium 3.6 3.5 - 5.1 mmol/L   Chloride 102 98 - 111 mmol/L   CO2 22 22 - 32 mmol/L   Glucose, Bld 117 (H) 70 - 99 mg/dL    Comment: Glucose reference range applies only to samples taken after fasting for at least 8 hours.   BUN 13 8 - 23 mg/dL   Creatinine, Ser 0.55 0.44 - 1.00 mg/dL   Calcium 9.7 8.9 - 10.3 mg/dL   GFR, Estimated >60 >60 mL/min    Comment: (NOTE) Calculated using the CKD-EPI Creatinine Equation (2021)    Anion gap 14 5 - 15    Comment: Performed at [facility] [address] [place], [place] [address]  CBC     Status: None   Collection Time: 01/09/22  3:02 AM  Result Value Ref Range   WBC 8.8 4.0 - 10.5 K/uL   RBC 4.01 3.87 - 5.11 MIL/uL   Hemoglobin 13.1 12.0 - 15.0 g/dL   HCT 38.6 36.0 - 46.0 %   MCV 96.3 80.0 - 100.0 fL   MCH 32.7 26.0 - 34.0 pg   MCHC 33.9 30.0 - 36.0 g/dL   RDW 12.8 11.5 - 15.5 %   Platelets 284 150 - 400 K/uL   nRBC 0.0 0.0 - 0.2 %    Comment: Performed at [facility], [place] [address], [place], [place] [address]    CT Head Wo Contrast  Result Date: 01/08/2022 CLINICAL DATA:  Frontal headache, dural venous sinus thrombosis suspected EXAM: CT HEAD WITHOUT CONTRAST TECHNIQUE: Contiguous axial images were obtained from the base of the skull through the vertex without intravenous contrast. RADIATION DOSE REDUCTION: This exam was performed according to the departmental dose-optimization program which includes automated exposure control, adjustment of the mA and/or kV according to patient size and/or use of iterative reconstruction technique. COMPARISON:  MRI earlier today FINDINGS: Brain: No intracranial hemorrhage,  mass effect, or evidence of acute infarct. No hydrocephalus. No extra-axial fluid collection. Vascular: Hyperdensity within the straight sinus (series 7/image 29) compatible with thrombosis as seen on MRI earlier today. Skull: No fracture or focal lesion. Sinuses/Orbits: No acute finding. Paranasal sinuses and mastoid air cells are well aerated. Other: None. IMPRESSION: Hyperdensity in the straight sinus compatible with thrombus as seen on MRI earlier today. No evidence of hemorrhage or acute infarct. Electronically Signed   By: Placido Sou M.D.   On: 01/08/2022 18:51   MR BRAIN W WO CONTRAST  Result Date: 01/08/2022 CLINICAL DATA:  Non-small cell lung cancer (NSCLC), staging EXAM: MRI HEAD WITHOUT AND WITH CONTRAST TECHNIQUE: Multiplanar, multiecho pulse sequences of the brain and surrounding structures were obtained without and with intravenous contrast. CONTRAST:  23mL GADAVIST GADOBUTROL 1 MMOL/ML IV SOLN COMPARISON:  None Available. FINDINGS: Brain: There is no acute infarction or intracranial hemorrhage. There is no intracranial mass, mass effect, or edema. There is no hydrocephalus or extra-axial fluid collection. Ventricles and sulci are within normal limits in size and configuration. Minimal  punctate foci of T2 hyperintensity in the supratentorial white matter nonspecific but may reflect minor chronic microvascular ischemic changes. No abnormal enhancement. Vascular: There is thrombus within the proximal to mid straight sinus. Remainder of dural venous sinuses are patent. Major vessel flow voids at the skull base are preserved. Skull and upper cervical spine: Normal marrow signal is preserved. Partially imaged susceptibility artifact related to cervical spine fusion. Sinuses/Orbits: Paranasal sinuses are aerated. Orbits are unremarkable. Other: Sella is unremarkable.  Mastoid air cells are clear. IMPRESSION: Thrombus within the straight sinus. No evidence of intracranial metastatic disease. These  results will be called to the ordering clinician or representative by the Radiologist Assistant, and communication documented in the PACS or [employer]. Electronically Signed   By: Macy Mis M.D.   On: 01/08/2022 11:55    Review of Systems  HENT:  Negative for ear discharge, ear pain, hearing loss and tinnitus.   Eyes:  Negative for photophobia and pain.  Respiratory:  Negative for cough and shortness of breath.   Cardiovascular:  Negative for chest pain.  Gastrointestinal:  Negative for abdominal pain, nausea and vomiting.  Genitourinary:  Negative for dysuria, flank pain, frequency and urgency.  Musculoskeletal:  Positive for arthralgias (Bilateral hips L>>R) and back pain. Negative for myalgias and neck pain.  Neurological:  Negative for dizziness and headaches.  Hematological:  Does not bruise/bleed easily.  Psychiatric/Behavioral:  The patient is not nervous/anxious.    Blood pressure 127/63, pulse 90, temperature 98.2 F (36.8 C), temperature source Oral, resp. rate 20, height 5\' 3"  (1.6 m), weight 79.4 kg, SpO2 97 %. Physical Exam Constitutional:      General: She is not in acute distress.    Appearance: She is well-developed. She is not diaphoretic.  HENT:     Head: Normocephalic and atraumatic.  Eyes:     General: No scleral icterus.       Right eye: No discharge.        Left eye: No discharge.     Conjunctiva/sclera: Conjunctivae normal.  Cardiovascular:     Rate and Rhythm: Normal rate and regular rhythm.  Pulmonary:     Effort: Pulmonary effort is normal. No respiratory distress.  Musculoskeletal:     Cervical back: Normal range of motion.     Comments: RLE No traumatic wounds, ecchymosis, or rash  Nontender  No knee or ankle effusion  Knee stable to varus/ valgus and anterior/posterior stress  Sens DPN, SPN, TN intact  Motor EHL, ext, flex, evers 5/5  DP 2+, PT 2+, No significant edema  Skin:    General: Skin is warm and dry.  Neurological:      Mental Status: She is alert.  Psychiatric:        Mood and Affect: Mood normal.        Behavior: Behavior normal.     Assessment/Plan: Right femur lesion -- May benefit from prophylactic IMN of the femur. Dr. Kathaleen Bury to evaluate once she is transferred to [facility]. Will get CT's of full femurs. Unsure of timing, could be this admission or electively.    Lisette Abu, PA-C Orthopedic Surgery [phone] 01/09/2022, 11:28 AM

## 2022-01-09 NOTE — Progress Notes (Addendum)
  Progress Note   Patient: Misty Decker VWP:[id number] DOB: [birth year] DOA: 01/08/2022     0 DOS: the patient was seen and examined on 01/09/2022   Brief hospital course: No notes on file  Assessment and Plan: 66 yo f recent h/o metastatic Lung cancer admitted for management of Sinus thrombosis  Straight sinus thrombosis  - ?possible malignancy.  - Appreciate neurology consult. - on Lovenox and eventually after 5 days to transition to dabigatran. To ensure availability of Lovenox in outpatient pharmacy before discharge.  Lung mass  - Biopsy obtained, pathology pending.   Hypertension   - hydrochlorothiazide. Hyperlipidemia  - ezetimibe.   Bony mets - Rad onc following outpatient, requested they be consulted to see while inpatient and initiate radiation therapy at [facility].  Requesting transfer to [facility] long for evaluation of palliative radiation  R p Femur ?met - Will request orthopedic evaluation given disease noted in R femur     Subjective: NAEON. No SOB/CP; Daughter at bedside   Physical Exam: Vitals:   01/09/22 0615 01/09/22 0630 01/09/22 0655 01/09/22 0708  BP: (!) 141/67 135/73 115/73   Pulse: 97 (!) 107 94   Resp: 17 (!) 23 18   Temp:    98.1 F (36.7 C)  TempSrc:    Oral  SpO2: 97% 96% 97%   Weight:      Height:        Eyes: Anicteric no pallor. ENMT: No discharge from the ears eyes nose and mouth. Neck: No mass felt.  No neck rigidity. Respiratory: No rhonchi or crepitations. Cardiovascular: S1-S2 heard. Abdomen: Soft nontender bowel sound present. Musculoskeletal: No edema. Skin: No rash. Neurologic: Alert awake oriented to time place and person.  Moves all extremities. Psychiatric: Appears normal.  Normal affect. Data Reviewed:  There are no new results to review at this time.  Family Communication:  daughter at bedside  Disposition: Status is: Observation The patient remains OBS appropriate and will d/c before 2 midnights.  Planned  Discharge Destination: Home    Time spent: 25 minutes  Author: Vanna Scotland, MD 01/09/2022 8:13 AM  For on call review [email or web address].

## 2022-01-09 NOTE — Consult Note (Signed)
Radiation Oncology         [phone]) [phone] ________________________________  Initial inpatient Consultation  CT SIMULATION SAME DAY  Name: Misty Decker MRN: [id number]  Date of Service: 01/08/2022 DOB: [birth year]  FO:YDXAJO, Maritza, PA-C  No ref. provider found   REFERRING PHYSICIAN: No ref. provider found  DIAGNOSIS: 66 year old female with poorly controlled back and left leg/hip pain associated with newly diagnosed widespread metastatic disease, felt most likely secondary to lung primary but pathology pending.    ICD-10-CM   1. Cerebral venous thrombosis  G08       HISTORY OF PRESENT ILLNESS: Misty Decker is a 66 y.o. female seen at the request of Dr. Valeta Harms.  She has a longstanding history of cervical and lumbar spine stenosis having undergone multiple fusions in the past.  More recently, she has had progressive low back pain radiating into the left buttock and left leg, unrelieved with epidural steroid injections and Neurontin. She underwent lumbar spine MRI on 12/17/21 which showed extensive multifocal osseous signal abnormality, concerning for widespread osseous metastatic disease or leukemia/lymphoma with hepatomegaly and an age-indeterminate L2 endplate compression fracture, likely pathologic. She proceeded to staging CT C/A/P on 12/26/21 which showed a 5.3 cm left lower lobe lung mass with bronchial wall thickening on the left as well as additional opacities in the lungs bilaterally, diffuse lytic and sclerotic lesions in the bones, and hepatic steatosis without any focal abnormality in the liver.  She was referred to Dr. Valeta Harms and underwent staging PET scan on 01/05/22 showing intense hypermetabolism in the LLL mass compatible with a primary bronchogenic carcinoma.additionally, there was a hypermetabolic LUL nodule suspicious for metastasis and tracer avid lymphadenopathy throughout the chest. There was widespread lytic bone metastases including a lytic lesion involving the  posterior aspect of the left iliac bone, bilateral proximal femurs, bilateral ribs and spinal metastases with a metastatic lesion with pathologic compression deformity at L2 and tumor extending into the left posterior elements with mass effect upon the L1-2 and L2-3 neural foramina.  There was also a tiny hypermetabolic nodule in left adrenal that is indeterminate.  A lytic lesion involving the proximal right femur and lesser trochanter appears to be at high risk for fracture and will need orthopedic evaluation.    She proceeded to bronchoscopy with EBUS on 01/06/22, and cytology results are pending.  Per verbal report from Dr. Vic Ripper, based on stains to date, this appears to be a NSCLC, adenocarcinoma but he is still awaiting additional stains for further information.   A staging brain MRI was performed on 01/08/22 and fortunately did not show any evidence of intracranial metastatic disease but did identify a thrombus within the straight sinus. She was therefore referred to the ED for further work up and has been admitted for anticoagulation.  She was scheduled for an outpatient consult visit in our office this morning but since she is admitted, we have been asked to move forward with an inpatient consult to discuss radiation therapy options for management of her back and left hip pain.  PREVIOUS RADIATION THERAPY: No  PAST MEDICAL HISTORY:  Past Medical History:  Diagnosis Date   Complication of anesthesia    "Hard to sedate", per pt and slow to wake up   COVID 10/2020   Degenerative joint disease    s/p cervical and lumbar fusions   Depression    pt denies   Encounter for preventive health examination    Next mammogram in 07/2009. Next colonoscopy in 09/2017. DEXA  4/09>Normal   Encounter for screening colonoscopy 10/03/2007   Normal   Fatty liver    Fibromyalgia    GERD (gastroesophageal reflux disease)    Hemorrhoid    hx of   History of hiatal hernia    Hyperlipidemia    Hypertension     Sciatica       PAST SURGICAL HISTORY: Past Surgical History:  Procedure Laterality Date   ABDOMINAL HYSTERECTOMY N/A    [facility] 11/21/2019   APPENDECTOMY N/A    [facility] 11/21/2019   BRONCHIAL NEEDLE ASPIRATION BIOPSY  01/06/2022   Procedure: BRONCHIAL NEEDLE ASPIRATION BIOPSIES;  Surgeon: Garner Nash, DO;  Location: [facility];  Service: Pulmonary;;   CERVICAL FUSION  2010   X 2   LUMBAR FUSION  1997   X 2/has plate and screws and cage   OVARIAN CYST SURGERY  1980   pelvic prolapse  1987   RECTAL PROLAPSE REPAIR     SPINE SURGERY N/A    [facility]ia 11/21/2019   TONSILLECTOMY     and adenoids   TOTAL ABDOMINAL HYSTERECTOMY  1987   VIDEO BRONCHOSCOPY WITH ENDOBRONCHIAL ULTRASOUND Left 01/06/2022   Procedure: VIDEO BRONCHOSCOPY WITH ENDOBRONCHIAL ULTRASOUND;  Surgeon: Garner Nash, DO;  Location: [facility];  Service: Pulmonary;  Laterality: Left;    FAMILY HISTORY:  Family History  Problem Relation Age of Onset   Cancer Mother    Diabetes Mother    Breast cancer Mother    Heart attack Mother    Hyperlipidemia Mother    Hyperlipidemia Father    Hypertension Father    Aneurysm Father    Dementia Maternal Grandfather    Cancer Paternal Grandmother        intestinal    SOCIAL HISTORY:  Social History   Socioeconomic History   Marital status: Married    Spouse name: Not on file   Number of children: Not on file   Years of education: Not on file   Highest education level: Not on file  Occupational History   Not on file  Tobacco Use   Smoking status: Former    Packs/day: 2.00    Years: 30.00    Total pack years: 60.00    Types: Cigarettes    Quit date: 04/03/2003    Years since quitting: 18.7   Smokeless tobacco: Never  Vaping Use   Vaping Use: Never used  Substance and Sexual Activity   Alcohol use: Not Currently    Alcohol/week: 0.0 standard drinks of alcohol   Drug use: No   Sexual activity: Not Currently  Other Topics Concern   Not on file   Social History Narrative   Not on file   Social Determinants of Health   Financial Resource Strain: Medium Risk (03/02/2021)   Overall Financial Resource Strain (CARDIA)    Difficulty of Paying Living Expenses: Somewhat hard  Food Insecurity: No Food Insecurity (03/02/2021)   Hunger Vital Sign    Worried About Running Out of Food in the Last Year: Never true    [place] in the Last Year: Never true  Transportation Needs: No Transportation Needs (03/02/2021)   PRAPARE - Hydrologist (Medical): No    Lack of Transportation (Non-Medical): No  Physical Activity: Inactive (03/02/2021)   Exercise Vital Sign    Days of Exercise per Week: 0 days    Minutes of Exercise per Session: 0 min  Stress: Stress Concern Present (03/02/2021)   [employer] of  Occupational Health - Occupational Stress Questionnaire    Feeling of Stress : To some extent  Social Connections: Moderately Integrated (03/02/2021)   Social Connection and Isolation Panel [NHANES]    Frequency of Communication with Friends and Family: More than three times a week    Frequency of Social Gatherings with Friends and Family: More than three times a week    Attends Religious Services: More than 4 times per year    Active Member of [employer] or Organizations: No    Attends Archivist Meetings: Never    Marital Status: Married  Human resources officer Violence: Not At Risk (03/02/2021)   Humiliation, Afraid, Rape, and Kick questionnaire    Fear of Current or Ex-Partner: No    Emotionally Abused: No    Physically Abused: No    Sexually Abused: No    ALLERGIES: Codeine, Morphine, Beef-derived products, Doxycycline, Ibuprofen, Iodinated contrast media, Latex, Nsaids, Penicillins, Pork-derived products, Tramadol, and Gabapentin  MEDICATIONS:  Current Facility-Administered Medications  Medication Dose Route Frequency Provider Last Rate Last Admin   enoxaparin (LOVENOX) injection 80 mg  80 mg  Subcutaneous Q12H Rise Patience, MD   80 mg at 01/09/22 0844   ezetimibe (ZETIA) tablet 10 mg  10 mg Oral Daily Rise Patience, MD   10 mg at 01/09/22 0844   hydrochlorothiazide (HYDRODIURIL) tablet 12.5 mg  12.5 mg Oral Daily Rise Patience, MD   12.5 mg at 01/09/22 0844   ondansetron (ZOFRAN) tablet 8 mg  8 mg Oral Q8H PRN Vanna Scotland, MD   8 mg at 01/09/22 1527   oxyCODONE (Oxy IR/ROXICODONE) immediate release tablet 5 mg  5 mg Oral Q6H PRN Rise Patience, MD   5 mg at 01/09/22 1527   Current Outpatient Medications  Medication Sig Dispense Refill   diphenhydrAMINE (BENADRYL) 25 MG tablet Take 25 mg by mouth daily as needed for allergies.     ezetimibe (ZETIA) 10 MG tablet TAKE 1 TABLET (10 MG TOTAL) BY MOUTH DAILY. 90 tablet 0   hydrochlorothiazide (HYDRODIURIL) 12.5 MG tablet TAKE 1 TABLET (12.5 MG TOTAL) BY MOUTH DAILY. 90 tablet 1   Multiple Minerals-Vitamins (CAL MAG ZINC +D3 PO) Take 1 tablet by mouth 3 (three) times daily.     Multiple Vitamin (MULTIVITAMIN) capsule Take 1 capsule by mouth daily.     enoxaparin (LOVENOX) 80 MG/0.8ML injection Inject 0.8 mLs (80 mg total) into the skin every 12 (twelve) hours for 4 days. 7 mL 0    REVIEW OF SYSTEMS:  On review of systems, the patient reports that she is doing fair in general.  She denies any chest pain, shortness of breath, cough, fevers, chills, night sweats, or recent unintended weight changes.  She denies any bowel or bladder disturbances, and denies abdominal pain, nausea or vomiting. She denies any new musculoskeletal or joint aches or pains aside from the low back and left leg/hip pain as outlined above in the HPI.  She denies any focal weakness or paresthesias in the lower extremities and in fact has some hypersensitivity to light touch along the left thigh.  A complete review of systems is obtained and is otherwise negative.    PHYSICAL EXAM:  Wt Readings from Last 3 Encounters:  01/08/22 175 lb (79.4 kg)   01/06/22 175 lb (79.4 kg)  11/24/21 183 lb (83 kg)   Temp Readings from Last 3 Encounters:  01/09/22 98.1 F (36.7 C) (Oral)  01/06/22 99.2 F (37.3 C)  12/30/21 98.4 F (  36.9 C) (Oral)   BP Readings from Last 3 Encounters:  01/09/22 (!) 163/84  01/06/22 111/72  12/30/21 126/68   Pulse Readings from Last 3 Encounters:  01/09/22 96  01/06/22 87  12/30/21 61   Pain Assessment Pain Score: 9 /10  In general this is a well appearing Caucasian female in no acute distress.  She's alert and oriented x4 and appropriate throughout the examination. Cardiopulmonary assessment is negative for acute distress and she exhibits normal effort.   KPS = 60  100 - Normal; no complaints; no evidence of disease. 90   - Able to carry on normal activity; minor signs or symptoms of disease. 80   - Normal activity with effort; some signs or symptoms of disease. 12   - Cares for self; unable to carry on normal activity or to do active work. 60   - Requires occasional assistance, but is able to care for most of his personal needs. 50   - Requires considerable assistance and frequent medical care. 86   - Disabled; requires special care and assistance. 74   - Severely disabled; hospital admission is indicated although death not imminent. 89   - Very sick; hospital admission necessary; active supportive treatment necessary. 10   - Moribund; fatal processes progressing rapidly. 0     - Dead  Karnofsky DA, Abelmann [place], Craver LS and Burchenal [facility] [phone]) The use of the nitrogen mustards in the palliative treatment of carcinoma: with particular reference to bronchogenic carcinoma Cancer 1 [id number]  LABORATORY DATA:  Lab Results  Component Value Date   WBC 8.8 01/09/2022   HGB 13.1 01/09/2022   HCT 38.6 01/09/2022   MCV 96.3 01/09/2022   PLT 284 01/09/2022   Lab Results  Component Value Date   NA 138 01/09/2022   K 3.6 01/09/2022   CL 102 01/09/2022   CO2 22 01/09/2022   Lab Results  Component  Value Date   ALT 21 07/07/2021   AST 34 07/07/2021   ALKPHOS 121 07/07/2021   BILITOT 0.4 07/07/2021     RADIOGRAPHY: CT FEMUR LEFT WO CONTRAST  Result Date: 01/09/2022 CLINICAL DATA:  Metastatic disease evaluation. Bilateral femur. Left leg pain. Primary bronchogenic carcinoma. EXAM: CT OF THE LOWER LEFT EXTREMITY WITHOUT CONTRAST TECHNIQUE: Multidetector CT imaging of the lower left extremity was performed according to the standard protocol. RADIATION DOSE REDUCTION: This exam was performed according to the departmental dose-optimization program which includes automated exposure control, adjustment of the mA and/or kV according to patient size and/or use of iterative reconstruction technique. COMPARISON:  PET-CT 01/05/2022 FINDINGS: Bones/Joint/Cartilage Note is made on recent PET-CT of numerous hypermetabolic lytic bone metastases within the axial and appendicular skeleton. On the current CT, there are numerous lytic lesions seen throughout the visualized pelvis. A reference predominantly lytic lesion within the posterior left ilium was more completely seen on recent PET-CT and measures up to approximately 2.5 x 5.0 cm in greatest transverse by AP dimensions (axial series 3, image 1). There is high-grade thinning of the posteromedial iliac cortex (axial image 1). Superior lateral left acetabular subcortical lytic lesion measures up to approximately 1.1 x 1.8 x 1.9 cm (transverse by AP by craniocaudal) with abutment of the lateral cortex but no definitive breakthrough (axial series 2, image 32 and coronal series 7, image 56). There areas of patchy lytic lucency sclerosis within the left femoral head suspicious for metastatic disease involvement. Within the limitations of diffuse decreased bone mineralization, no definitive destructive lytic lesion is  seen within the left intertrochanteric more distal left femur. Moderate medial compartment of the knee joint space narrowing with subchondral sclerosis and  tiny peripheral medial ossicle degenerative change. Ligaments Suboptimally assessed by CT. Muscles and Tendons Mild fatty infiltration of the proximal anterior left gluteus minimus muscle body. No gross tendon tear is seen within the visualized left thigh. Soft tissues No knee joint effusion.  No soft tissue fluid collection. IMPRESSION: 1. Numerous lytic metastases within the visualized pelvis as on prior PET-CT. 2. Dominant partially visualized posterior left iliac lesion that was hypermetabolic on prior PET-CT measures up to approximately 2.5 x 5.0 cm in greatest transverse by AP dimensions. This causes high-grade thinning of the posteromedial iliac cortex. 3. Smaller superolateral left acetabular 1.9 cm lytic lesion abuts the lateral cortex without definite cortical breakthrough. Electronically Signed   By: Yvonne Kendall M.D.   On: 01/09/2022 13:51   CT FEMUR RIGHT WO CONTRAST  Result Date: 01/09/2022 CLINICAL DATA:  Metastatic disease evaluation. Bilateral femur. Left leg pain. Primary bronchogenic carcinoma. EXAM: CT OF THE LOWER RIGHT EXTREMITY WITHOUT CONTRAST TECHNIQUE: Multidetector CT imaging of the right lower extremity was performed according to the standard protocol. RADIATION DOSE REDUCTION: This exam was performed according to the departmental dose-optimization program which includes automated exposure control, adjustment of the mA and/or kV according to patient size and/or use of iterative reconstruction technique. COMPARISON:  PET-CT through the bilateral proximal femurs 01/05/2022 FINDINGS: Bones/Joint/Cartilage There again diffuse lytic metastases, seen on prior recent PET-CT to be hypermetabolic and to involve the appendicular axial skeleton. Reference lytic lesions include right iliac 1.7 x 1.3 cm lytic lesion (axial series 3, image 8) causing thinning of the anterior iliac cortex (greatest on axial series 2, image 6 where there is likely minimal pathologic breakthrough of the cortex). Right  distal femoral neck and intertrochanteric to lesser trochanteric region of the right femur lucent lesion measuring up to approximately 4.1 by 2.8 by 5.1 cm (transverse by AP by craniocaudal, axial series 2, image 92 and coronal series 7, image 76). This thins the posterior distal femoral neck and the posterior lesser trochanteric cortex (axial series 3, images 84 through 91 with areas of complete cortical breakthrough. No definitive metastatic lesion is seen within the more distal right femur, however diffuse decreased bone mineralization does limit sensitivity. Ligaments Suboptimally assessed by CT. Muscles and Tendons Normal density in size of the region of the right thigh musculature. No gross tendon tear is seen. Soft tissues No soft tissue fluid collection is seen. Mild atherosclerotic calcifications are partially visualized within the pelvis. IMPRESSION: 1. Diffuse lytic metastases are again seen throughout the pelvis and proximal right femur. 2. Dominant right distal inferior femoral neck, intertrochanteric and lesser trochanteric lytic lesion again thins the medial cortex and appears to minimally breakthrough the posterior lesser trochanteric cortex. This increases susceptibility for risk of pathologic fracture of the Mirels classification system given lower limb location, trochanteric region location, involving greater than 2/3 of the transverse bone diameter, and lytic quality. Recommend clinical evaluation for possible functional pain to complete this Mirels classification assessment. Electronically Signed   By: Yvonne Kendall M.D.   On: 01/09/2022 13:41   CT Head Wo Contrast  Result Date: 01/08/2022 CLINICAL DATA:  Frontal headache, dural venous sinus thrombosis suspected EXAM: CT HEAD WITHOUT CONTRAST TECHNIQUE: Contiguous axial images were obtained from the base of the skull through the vertex without intravenous contrast. RADIATION DOSE REDUCTION: This exam was performed according to the  departmental dose-optimization  program which includes automated exposure control, adjustment of the mA and/or kV according to patient size and/or use of iterative reconstruction technique. COMPARISON:  MRI earlier today FINDINGS: Brain: No intracranial hemorrhage, mass effect, or evidence of acute infarct. No hydrocephalus. No extra-axial fluid collection. Vascular: Hyperdensity within the straight sinus (series 7/image 29) compatible with thrombosis as seen on MRI earlier today. Skull: No fracture or focal lesion. Sinuses/Orbits: No acute finding. Paranasal sinuses and mastoid air cells are well aerated. Other: None. IMPRESSION: Hyperdensity in the straight sinus compatible with thrombus as seen on MRI earlier today. No evidence of hemorrhage or acute infarct. Electronically Signed   By: Placido Sou M.D.   On: 01/08/2022 18:51   MR BRAIN W WO CONTRAST  Result Date: 01/08/2022 CLINICAL DATA:  Non-small cell lung cancer (NSCLC), staging EXAM: MRI HEAD WITHOUT AND WITH CONTRAST TECHNIQUE: Multiplanar, multiecho pulse sequences of the brain and surrounding structures were obtained without and with intravenous contrast. CONTRAST:  47mL GADAVIST GADOBUTROL 1 MMOL/ML IV SOLN COMPARISON:  None Available. FINDINGS: Brain: There is no acute infarction or intracranial hemorrhage. There is no intracranial mass, mass effect, or edema. There is no hydrocephalus or extra-axial fluid collection. Ventricles and sulci are within normal limits in size and configuration. Minimal punctate foci of T2 hyperintensity in the supratentorial white matter nonspecific but may reflect minor chronic microvascular ischemic changes. No abnormal enhancement. Vascular: There is thrombus within the proximal to mid straight sinus. Remainder of dural venous sinuses are patent. Major vessel flow voids at the skull base are preserved. Skull and upper cervical spine: Normal marrow signal is preserved. Partially imaged susceptibility artifact related  to cervical spine fusion. Sinuses/Orbits: Paranasal sinuses are aerated. Orbits are unremarkable. Other: Sella is unremarkable.  Mastoid air cells are clear. IMPRESSION: Thrombus within the straight sinus. No evidence of intracranial metastatic disease. These results will be called to the ordering clinician or representative by the Radiologist Assistant, and communication documented in the PACS or [employer]. Electronically Signed   By: Macy Mis M.D.   On: 01/08/2022 11:55   NM PET Image Initial (PI) Skull Base To Thigh (F-18 FDG)  Result Date: 01/05/2022 CLINICAL DATA:  Initial treatment strategy for non-small cell lung cancer. EXAM: NUCLEAR MEDICINE PET SKULL BASE TO THIGH TECHNIQUE: 8.74 mCi F-18 FDG was injected intravenously. Full-ring PET imaging was performed from the skull base to thigh after the radiotracer. CT data was obtained and used for attenuation correction and anatomic localization. Fasting blood glucose: 136 mg/dl COMPARISON:  CT 12/26/2021 FINDINGS: Mediastinal blood pool activity: SUV max 3.25 Liver activity: SUV max NA NECK: No hypermetabolic lymph nodes in the neck. Incidental CT findings: none CHEST: There is a mass within the superior segment of the left lower lobe which measures 5.1 x 3.7 cm within SUV max of 18.24, image 37/7. Left upper lobe pulmonary nodule measures 8 mm and has an SUV max 3.82, image 28/7. Part solid nodule within the apical segment of the left upper lobe measures 2.3 cm with a 4 mm peripheral solid component, image 12/7. This has an SUV max 1.98. FDG avid left hilar, AP window, subcarinal, and right paratracheal lymph nodes identified. The index right paratracheal lymph node measures 9 mm with SUV max of 10.11, image 68/4. The subcarinal lymph node measures 8 mm with SUV max of 12.37, image 72/4. Left hilar lymph node has an SUV max 11.44. Incidental CT findings: Aortic atherosclerosis. No pericardial effusion. No pleural effusion. ABDOMEN/PELVIS: There  is no  abnormal FDG uptake within the liver, pancreas, or spleen. Mild asymmetric increased uptake within the left adrenal gland corresponds to a tiny nodule measuring 6 mm with SUV max of 5.31, image 111/4. No tracer avid abdominopelvic lymph nodes identified. Incidental CT findings: Aortic atherosclerosis. Moderate size hiatal hernia identified. SKELETON: Extensive tracer avid lytic bone metastases are identified involving the proximal appendicular and axial skeleton. Lesions are too numerous to count. Of potential orthopedic significance is a large tracer avid lytic lesion involving the proximal right femur and lesser trochanter with SUV max of 13.11, image 188/4. Lytic lesion involving the posterior aspect of the left iliac bone measures 4 cm with SUV max of 14.58, image 158/4. extensive bilateral ribs and spinal metastases are noted. Metastatic lesion with pathologic compression deformity is noted L2 vertebral body with SUV max 11.93. Here, tumor extends into the left posterior elements with mass effect upon the L1-2 and L2-3 neural foramina. Incidental CT findings: none IMPRESSION: 1. Left lower lobe lung mass is intensely FDG avid compatible with primary bronchogenic carcinoma. 2. Left upper lobe pulmonary nodule suspicious for pulmonary metastasis. 3. FDG avid ipsilateral hilar, ipsilateral mediastinal, subcarinal, and contralateral right mediastinal nodal metastasis. 4. Widespread FDG avid lytic bone metastasis. Lytic lesion within the proximal right femur may be of orthopedic significance. 5. Part solid nodule with low level FDG uptake is identified within the apical segment of the left upper lobe. Cannot exclude indolent pulmonary neoplasm such as adenocarcinoma. 6. Tiny nodule in the left adrenal gland is FDG avid. Early left adrenal metastasis cannot be excluded. Electronically Signed   By: Kerby Moors M.D.   On: 01/05/2022 16:21   CT Abdomen Pelvis W Contrast  Result Date: 12/28/2021 CLINICAL  DATA:  Metastatic disease evaluation, abnormal finding on MRI. Initial staging. EXAM: CT CHEST, ABDOMEN, AND PELVIS WITH CONTRAST TECHNIQUE: Multidetector CT imaging of the chest, abdomen and pelvis was performed following the standard protocol during bolus administration of intravenous contrast. RADIATION DOSE REDUCTION: This exam was performed according to the departmental dose-optimization program which includes automated exposure control, adjustment of the mA and/or kV according to patient size and/or use of iterative reconstruction technique. CONTRAST:  135mL ISOVUE-300 IOPAMIDOL (ISOVUE-300) INJECTION 61% COMPARISON:  02/09/2010, 12/17/2021. FINDINGS: CT CHEST FINDINGS Cardiovascular: The heart is normal in size and there is no pericardial effusion. There is atherosclerotic calcification of the aorta without evidence of aneurysm. The pulmonary trunk is normal in caliber. Mediastinum/Nodes: A prominent lymph node is noted in the right paratracheal space measuring 9 mm. No axillary lymphadenopathy. The thyroid gland, trachea, and esophagus are within normal limits. A small hiatal hernia is noted. Lungs/Pleura: Emphysematous changes are present in the lungs. Irregular soft tissue thickening is noted in the hilar region on the left which is continuous with a left lower lobe mass measuring 5.3 x 4.1 x 4.5 cm, axial image 74. There is a spiculated nodule in the posterior segment of the right upper lobe measuring 9 mm, axial image 55. There is a subsolid nodular opacity in the apical segment of the left upper lobe measuring 2.9 cm, axial image 24. Scattered hazy ground-glass and nodular ground-glass opacities are noted in the right lung measuring up to 1.1 cm, axial image 61. There is a irregular subsolid nodular opacity in the right lower lobe measuring 9 mm, axial image 109. There is a trace left pleural effusion with subsegmental atelectasis in the left lower lobe and lingular segment of the left upper lobe.  Musculoskeletal: Cervical spinal fusion  hardware is noted. Multiple mixed lytic and sclerotic lesions are noted within the bones, compatible with metastatic disease. No acute fracture. CT ABDOMEN PELVIS FINDINGS Hepatobiliary: No focal abnormality. Fatty infiltration of the liver is noted. No biliary ductal dilatation. The gallbladder is without stones. Pancreas: Unremarkable. No pancreatic ductal dilatation or surrounding inflammatory changes. Spleen: Normal in size without focal abnormality. Adrenals/Urinary Tract: No adrenal nodule or mass. The kidneys enhance symmetrically. No renal calculus or hydronephrosis. The bladder is unremarkable. Stomach/Bowel: Stomach is within normal limits. Appendix appears normal. No evidence of bowel wall thickening, distention, or inflammatory changes. No free air or pneumatosis. Vascular/Lymphatic: Aortic atherosclerosis. No enlarged abdominal or pelvic lymph nodes. Reproductive: Status post hysterectomy. No adnexal masses. Other: No abdominopelvic ascites. Musculoskeletal: Diffuse lytic and sclerotic lesions are noted in the bones, suggesting metastatic disease. Lumbar spinal fusion hardware is noted from L4-S1. A stable compression deformity is present at L2, possible pathologic fracture. IMPRESSION: 1. Irregular bronchial wall thickening on the left with continuation to left lower lobe mass measuring 5.3 x 4.1 x 4.5 cm, primary neoplasm versus metastatic disease. Additional spiculated, subsolid, and ground-glass opacities are noted in the lungs bilaterally, concerning for metastasis. PET-CT and/or tissue sampling is recommended for definitive diagnosis. 2. Diffuse lytic and sclerotic lesions in the bones with possible pathologic fracture at L2, suspicious for metastatic disease. 3. Hepatic steatosis. No focal abnormality is identified in the liver. 4. Small hiatal hernia. 5. Aortic atherosclerosis. Electronically Signed   By: Brett Fairy M.D.   On: 12/28/2021 21:23   CT  Chest W Contrast  Result Date: 12/28/2021 CLINICAL DATA:  Metastatic disease evaluation, abnormal finding on MRI. Initial staging. EXAM: CT CHEST, ABDOMEN, AND PELVIS WITH CONTRAST TECHNIQUE: Multidetector CT imaging of the chest, abdomen and pelvis was performed following the standard protocol during bolus administration of intravenous contrast. RADIATION DOSE REDUCTION: This exam was performed according to the departmental dose-optimization program which includes automated exposure control, adjustment of the mA and/or kV according to patient size and/or use of iterative reconstruction technique. CONTRAST:  19mL ISOVUE-300 IOPAMIDOL (ISOVUE-300) INJECTION 61% COMPARISON:  02/09/2010, 12/17/2021. FINDINGS: CT CHEST FINDINGS Cardiovascular: The heart is normal in size and there is no pericardial effusion. There is atherosclerotic calcification of the aorta without evidence of aneurysm. The pulmonary trunk is normal in caliber. Mediastinum/Nodes: A prominent lymph node is noted in the right paratracheal space measuring 9 mm. No axillary lymphadenopathy. The thyroid gland, trachea, and esophagus are within normal limits. A small hiatal hernia is noted. Lungs/Pleura: Emphysematous changes are present in the lungs. Irregular soft tissue thickening is noted in the hilar region on the left which is continuous with a left lower lobe mass measuring 5.3 x 4.1 x 4.5 cm, axial image 74. There is a spiculated nodule in the posterior segment of the right upper lobe measuring 9 mm, axial image 55. There is a subsolid nodular opacity in the apical segment of the left upper lobe measuring 2.9 cm, axial image 24. Scattered hazy ground-glass and nodular ground-glass opacities are noted in the right lung measuring up to 1.1 cm, axial image 61. There is a irregular subsolid nodular opacity in the right lower lobe measuring 9 mm, axial image 109. There is a trace left pleural effusion with subsegmental atelectasis in the left lower  lobe and lingular segment of the left upper lobe. Musculoskeletal: Cervical spinal fusion hardware is noted. Multiple mixed lytic and sclerotic lesions are noted within the bones, compatible with metastatic disease. No acute fracture.  CT ABDOMEN PELVIS FINDINGS Hepatobiliary: No focal abnormality. Fatty infiltration of the liver is noted. No biliary ductal dilatation. The gallbladder is without stones. Pancreas: Unremarkable. No pancreatic ductal dilatation or surrounding inflammatory changes. Spleen: Normal in size without focal abnormality. Adrenals/Urinary Tract: No adrenal nodule or mass. The kidneys enhance symmetrically. No renal calculus or hydronephrosis. The bladder is unremarkable. Stomach/Bowel: Stomach is within normal limits. Appendix appears normal. No evidence of bowel wall thickening, distention, or inflammatory changes. No free air or pneumatosis. Vascular/Lymphatic: Aortic atherosclerosis. No enlarged abdominal or pelvic lymph nodes. Reproductive: Status post hysterectomy. No adnexal masses. Other: No abdominopelvic ascites. Musculoskeletal: Diffuse lytic and sclerotic lesions are noted in the bones, suggesting metastatic disease. Lumbar spinal fusion hardware is noted from L4-S1. A stable compression deformity is present at L2, possible pathologic fracture. IMPRESSION: 1. Irregular bronchial wall thickening on the left with continuation to left lower lobe mass measuring 5.3 x 4.1 x 4.5 cm, primary neoplasm versus metastatic disease. Additional spiculated, subsolid, and ground-glass opacities are noted in the lungs bilaterally, concerning for metastasis. PET-CT and/or tissue sampling is recommended for definitive diagnosis. 2. Diffuse lytic and sclerotic lesions in the bones with possible pathologic fracture at L2, suspicious for metastatic disease. 3. Hepatic steatosis. No focal abnormality is identified in the liver. 4. Small hiatal hernia. 5. Aortic atherosclerosis. Electronically Signed   By:  Brett Fairy M.D.   On: 12/28/2021 21:23   MR LUMBAR SPINE WO CONTRAST  Result Date: 12/19/2021 CLINICAL DATA:  Lumbar radiculopathy. Additional history provided by scanning technologist: Patient reports low back pain radiating to hip/groin/leg bilaterally. Bilateral leg weakness. Muscle spasms in left hand and bilateral feet. EXAM: MRI LUMBAR SPINE WITHOUT CONTRAST TECHNIQUE: Multiplanar, multisequence MR imaging of the lumbar spine was performed. No intravenous contrast was administered. COMPARISON:  CT abdomen/pelvis 02/09/2010. Report from lumbar spine MRI 11/21/2000 (images unavailable). FINDINGS: Segmentation: 5 lumbar vertebrae. The caudal most well-formed intervertebral disc space is designated L5-S1. Alignment: 4 mm bony retropulsion at L2. Minimal T11-T12 and T12-L1 grade 1 retrolisthesis. 3 mm L2-L3 grade 1 retrolisthesis. 2 mm L3-L4 grade 1 retrolisthesis. 4 mm L4-L5 grade 1 anterolisthesis. Trace L5-S1 grade 1 retrolisthesis. Vertebrae: Marked diffuse osseous heterogeneity with extensive multifocal T1 hypointense and STIR hyperintense signal abnormality. There is a more circumscribed osseous lesion within the right aspect of the T11 vertebral body and right T11 pedicle measuring at least 2.2 cm (for instance as seen on series 5, image 7). Age-indeterminate L2 vertebral compression fracture on the left (with up to 50% height loss). There is T1 hypointense and subtle STIR hyperintense signal abnormality throughout the L2 vertebral body, and this likely reflects a pathologic fracture. No compression fracture at the remaining lumbar levels. Susceptibility artifact arising from a posterior spinal fusion construct at the L4-S1 levels. Multilevel ventrolateral osteophytes. Conus medullaris and cauda equina: Conus extends to the L2 level. No signal abnormality identified within the visualized distal spinal cord. Paraspinal and other soft tissues: Hepatomegaly. The incompletely imaged liver measures at  least 20 cm in craniocaudal dimension. No paraspinal mass or collection. Disc levels: Multilevel disc degeneration. Most notably, moderate to moderately advanced disc degeneration is present at T11-T12, T12-L1 and L4-L5. T10-T11: This level is imaged in the sagittal plane only. Slight disc bulge and ligamentum flavum hypertrophy. No significant spinal canal or foraminal stenosis. T11-T12: This level is imaged in the sagittal plane only. Minimal grade 1 retrolisthesis. Disc bulge. Minimal effacement of the ventral thecal sac (without spinal cord mass effect). No significant  foraminal stenosis. T12-L1: Minimal grade 1 retrolisthesis. Disc bulge. Minimal effacement of ventral thecal sac (without significant spinal cord mass effect). No significant foraminal stenosis. L1-L2: 4 mm bony retropulsion on the left at L2. Remodeling of the left aspect of the L2 vertebral body and left L2 posterior elements, suspected due to intraosseous tumor. Mild facet arthrosis. Prominence of the dorsal epidural fat. Left subarticular stenosis without appreciable frank nerve root impingement). Mild central canal stenosis. No significant foraminal narrowing. L2-L3: 3 mm grade 1 retrolisthesis. Remodeling of the left aspect of the L2 vertebral body and left L2 posterior elements, suspected due to intraosseous tumor. Facet arthrosis (mild right, mild-to-moderate left) with ligamentum flavum hypertrophy. Prominence of the dorsal epidural fat. Moderately severe spinal canal stenosis. Bilateral neural foraminal narrowing (mild right, severe left). L3-L4: 2 mm grade 1 retrolisthesis. Disc bulge. Moderate facet arthrosis with ligamentum flavum hypertrophy. Bilateral subarticular narrowing with medialization of the descending L4 nerve roots. Moderate central canal stenosis. Bilateral neural foraminal narrowing (mild to moderate right, mild left). L4-L5: Post-laminectomy changes. 4 mm grade 1 anterolisthesis. Disc uncovering with disc bulge and  endplate spurring. No significant spinal canal stenosis. Susceptibility artifact limits evaluation of the neural foramina. Neural foraminal narrowing is suspected moderate/severe on the right, and moderate on the left). L5-S1: Possible post-laminectomy changes (susceptibility artifact limits evaluation). Minimal grade 1 retrolisthesis. Disc bulge. Facet hypertrophy. Susceptibility artifact limits evaluation of the spinal canal. No definite spinal canal stenosis is appreciated. Susceptibility artifact precludes adequate evaluation of the right neural foramen. No significant left foraminal stenosis. Impressions #1 and #2 will be called to the ordering clinician or representative by the Radiologist Assistant, and communication documented in the PACS or [employer]. IMPRESSION: Extensive multifocal osseous signal abnormality. Hepatomegaly is also noted. This constellation of findings is highly suspicious for widespread osseous metastatic disease or leukemia/lymphoma. Correlate clinically and consider a CT of the chest/abdomen and pelvis with contrast for further evaluation. Age-indeterminate L2 superior endplate vertebral compression fracture on the left (with up to 50% height loss). There is diffuse signal abnormality throughout the L2 vertebral body and this fracture is likely pathologic. Lumbar spondylosis and postoperative changes, as outlined. Central canal stenosis is greatest at L2-L3 (moderately-severe) and L3-L4 (moderate). Multilevel subarticular stenosis, as detailed. Neural foraminal narrowing is greatest on the left at L2-L3 (severe) and on the right at L4-L5 (suspected moderate/severe). Electronically Signed   By: Kellie Simmering D.O.   On: 12/19/2021 12:57      IMPRESSION/PLAN: 1. 66 y.o. female with poorly controlled back and left leg/hip pain associated with newly diagnosed widespread metastatic disease, felt most likely secondary to lung primary but pathology pending. Today, we talked to the  patient about the findings and workup thus far. We discussed the natural history of metastatic lung cancer and general treatment, highlighting the role of radiotherapy in the management. We discussed the available radiation techniques, and focused on the details and logistics of delivery.  The recommendation is to proceed with a 2-week course of daily, palliative radiation to the painful disease at T11 and L2.  We have recommended orthopedic evaluation to weigh in on whether or not surgical stabilization of the right and/or left hips are necessary prior to proceeding with palliative radiation to the hips.  If surgical stabilization is not warranted, we would plan to include the left hip and possibly the right hip in the 2-week course of palliative radiation.  We reviewed the anticipated acute and late sequelae associated with radiation in this setting.  The patient was encouraged to ask questions that were answered to her stated satisfaction.  At the conclusion of our discussion, she is in agreement to proceed with the recommended 2-week course of daily, palliative radiation to the painful disease at T11 and L2, possibly including the hips pending feedback from orthopedics. She has freely signed written consent to proceed today in the office and a copy of this document will be placed in her medical record.  She will proceed with CT simulation/treatment planning following our visit today in anticipation of beginning her daily radiation treatments on Monday, 01/12/2022.  She understands that her treatments can be completed on an outpatient basis should she be deemed stable for discharge prior to the start and/or completion of her radiation treatments.  She appears to have a good understanding of her disease and our recommendations which are of palliative intent and is comfortable and in agreement with the stated plan.  We have also placed a referral to interventional radiology for consideration of RFA and kyphoplasty  for further pain management and stabilization of the compression fracture at L2 after completion of her radiation.  We enjoyed meeting her today and look forward to continuing to participate in her care.   We personally spent 70 minutes in this encounter including chart review, reviewing radiological studies, meeting face-to-face with the patient, entering orders and completing documentation.    Nicholos Johns, PA-C    Tyler Pita, MD  [facility] Direct Dial: [phone]  Fax: [phone] [email or web address]  Skype  [email or web address]

## 2022-01-09 NOTE — ED Notes (Signed)
Pt assisted to BR via W/C. Standby assist. Pt states she cannot ambulate. Transferred to W/C from stretcher with minimal assist.

## 2022-01-09 NOTE — ED Notes (Signed)
Report given to [facility]. Pt transported via stretcher out of department to [facility] at this time.

## 2022-01-10 DIAGNOSIS — I8290 Acute embolism and thrombosis of unspecified vein: Secondary | ICD-10-CM | POA: Diagnosis not present

## 2022-01-10 LAB — LIPID PANEL
Cholesterol: 165 mg/dL (ref 0–200)
HDL: 36 mg/dL — ABNORMAL LOW (ref >40)
LDL Cholesterol: 87 mg/dL (ref 0–99)
Total CHOL/HDL Ratio: 4.6 RATIO
Triglycerides: 210 mg/dL — ABNORMAL HIGH (ref <150)
VLDL: 42 mg/dL — ABNORMAL HIGH (ref 0–40)

## 2022-01-10 LAB — HEMOGLOBIN A1C
Hgb A1c MFr Bld: 5.6 % (ref 4.8–5.6)
Mean Plasma Glucose: 114.02 mg/dL

## 2022-01-10 LAB — VITAMIN D 25 HYDROXY (VIT D DEFICIENCY, FRACTURES): Vit D, 25-Hydroxy: 43.16 ng/mL (ref 30–100)

## 2022-01-10 LAB — VITAMIN B12: Vitamin B-12: 1112 pg/mL — ABNORMAL HIGH (ref 180–914)

## 2022-01-10 LAB — BETA-2-GLYCOPROTEIN I ABS, IGG/M/A
Beta-2 Glyco I IgG: <9 GPI IgG units (ref 0–20)
Beta-2-Glycoprotein I IgA: 15 GPI IgA units (ref 0–25)
Beta-2-Glycoprotein I IgM: <9 GPI IgM units (ref 0–32)

## 2022-01-10 MED ORDER — METOPROLOL TARTRATE 5 MG/5ML IV SOLN: 5.0000 mg | INTRAVENOUS | Status: DC | PRN

## 2022-01-10 MED ORDER — SENNOSIDES-DOCUSATE SODIUM 8.6-50 MG PO TABS: 1.0000 | ORAL_TABLET | Freq: Every evening | ORAL | Status: DC | PRN

## 2022-01-10 MED ORDER — TRAZODONE HCL 50 MG PO TABS
50.0000 mg | ORAL_TABLET | Freq: Every evening | ORAL | Status: DC | PRN
  Administered 2022-01-10 – 2022-01-21 (×9): 50 mg via ORAL
  Filled 2022-01-10 (×9): qty 1

## 2022-01-10 MED ORDER — ACETAMINOPHEN 325 MG PO TABS
650.0000 mg | ORAL_TABLET | Freq: Four times a day (QID) | ORAL | Status: DC | PRN
  Administered 2022-01-14: 650 mg via ORAL
  Filled 2022-01-10: qty 2

## 2022-01-10 MED ORDER — GUAIFENESIN 100 MG/5ML PO LIQD: 5.0000 mL | ORAL | Status: DC | PRN

## 2022-01-10 MED ORDER — HYDRALAZINE HCL 20 MG/ML IJ SOLN: 10.0000 mg | INTRAMUSCULAR | Status: DC | PRN

## 2022-01-10 MED ORDER — DOCUSATE SODIUM 100 MG PO CAPS
100.0000 mg | ORAL_CAPSULE | Freq: Two times a day (BID) | ORAL | Status: DC
  Administered 2022-01-10 – 2022-01-14 (×9): 100 mg via ORAL
  Filled 2022-01-10 (×9): qty 1

## 2022-01-10 MED ORDER — IPRATROPIUM-ALBUTEROL 0.5-2.5 (3) MG/3ML IN SOLN: 3.0000 mL | RESPIRATORY_TRACT | Status: DC | PRN

## 2022-01-10 NOTE — Plan of Care (Signed)
Pt c/o pain 9/10 this morning.  PRNs administered per MAR.  Mobility limited d/t right leg pain.  Problem: Education: Goal: Knowledge of General Education information will improve Description: Including pain rating scale, medication(s)/side effects and non-pharmacologic comfort measures Outcome: Progressing   Problem: Health Behavior/Discharge Planning: Goal: Ability to manage health-related needs will improve Outcome: Progressing   Problem: Clinical Measurements: Goal: Ability to maintain clinical measurements within normal limits will improve Outcome: Progressing Goal: Will remain free from infection Outcome: Progressing Goal: Diagnostic test results will improve Outcome: Progressing Goal: Respiratory complications will improve Outcome: Progressing Goal: Cardiovascular complication will be avoided Outcome: Progressing   Problem: Activity: Goal: Risk for activity intolerance will decrease Outcome: Progressing   Problem: Nutrition: Goal: Adequate nutrition will be maintained Outcome: Progressing   Problem: Coping: Goal: Level of anxiety will decrease Outcome: Progressing   Problem: Elimination: Goal: Will not experience complications related to bowel motility Outcome: Progressing Goal: Will not experience complications related to urinary retention Outcome: Progressing   Problem: Safety: Goal: Ability to remain free from injury will improve Outcome: Progressing   Problem: Skin Integrity: Goal: Risk for impaired skin integrity will decrease Outcome: Progressing   Problem: Pain Managment: Goal: General experience of comfort will improve Outcome: Not Progressing

## 2022-01-10 NOTE — Progress Notes (Signed)
Please address questions to on call ortho at [facility].     Silvestre Gunner, PA-C

## 2022-01-10 NOTE — Progress Notes (Signed)
  Radiation Oncology         [phone]) [phone] ________________________________  Name: Misty Decker MRN: [id number]  Date: 01/09/2022  DOB: [birth year]  SIMULATION AND TREATMENT PLANNING NOTE    ICD-10-CM   1. Malignant neoplasm metastatic to bone [facility])  C79.51       DIAGNOSIS:  66 y.o. patient with lumbar metastasis  NARRATIVE:  The patient was brought to the [facility].  Identity was confirmed.  All relevant records and images related to the planned course of therapy were reviewed.  The patient freely provided informed written consent to proceed with treatment after reviewing the details related to the planned course of therapy. The consent form was witnessed and verified by the simulation staff.  Then, the patient was set-up in a stable reproducible  supine position for radiation therapy.  CT images were obtained.  Surface markings were placed.  The CT images were loaded into the planning software.  Then the target and avoidance structures were contoured including kidneys.  Treatment planning then occurred.  The radiation prescription was entered and confirmed.  Then, I designed and supervised the construction of a total of 3 medically necessary complex treatment devices with VacLoc positioner and 2 MLCs to shield kidneys.  I have requested : 3D Simulation  I have requested a DVH of the following structures: Left Kidney, Right Kidney and target.  PLAN:  The patient will receive 30 Gy in 10 fractions to T11, L2 and bilateral hips.  ________________________________  Sheral Apley. Tammi Klippel, M.D.

## 2022-01-10 NOTE — Progress Notes (Signed)
Asked to review imaging by Dr. Kathaleen Bury. Discussed patient with Dr. Reesa Chew, [facility]. She has multiple metastatic bone lesions in the pelvis and proximal femur. New diagnosis of lung mass, believed to be the primary. NSGY is following for pathologic L2 compression fx. CT R femur shows IT/ST lytic lesion. CT pelvis shows multiple lesions including supra acetabular involvement of the L side. Due to her complexity, she needs [facility]. For now, she can WBAT with walker at all times. OK to mobilize OOB with PT.

## 2022-01-10 NOTE — Progress Notes (Signed)
PROGRESS NOTE    Misty Decker  ZOX:[id number] DOB: 1956/04/27 DOA: 01/08/2022 PCP: Lorrene Reid, PA-C   Brief Narrative:  66 year old with history of  HTN, HLD, fibromyalgia and recent diagnosis of lung mass with metastatic disease and outpatient MRI brain done which showed sinus thrombosis therefore admitted to the hospital.  Patient was started on anticoagulation, neurology team was consulted.  Patient was transferred to [facility] from [facility] for radiation oncology evaluation who placed [facility] 8/4 with plans for 10 radiation treatments per Dr Tammi Klippel.  Also found to have multiple bone lesion including femur lesion therefore MRI is orthopedic consulted.   Assessment & Plan:  Principal Problem:   VTE (venous thromboembolism) Active Problems:   Essential hypertension   Blood clots in brain    Straight sinus thrombosis -Seen by neurology team who has recommended anticoagulation.  Currently patient on Lovenox every 12 hours.  Plans to transition to oral NOAC. Spoke with Dr Burr Medico - ok for Xarelto or Eliquis.   Metastatic lung mass with adenopathy Metastatic bone lesions, lytic right femur lesion - Underwent bronchoscopy with EBUS on 8/1 by pulmonary, biopsies performed.  Cytology consistent with adenocarcinoma -[facility] consulted.  Awaiting their input regarding surgical plans. -Patient transferred to [facility] from [facility] at the request of radiation oncology.  SIM placed 8/4.  Dr. Tammi Klippel plans on 10 radiation treatments but can be done outpatient.  Spoke with Dr Lyla Glassing From Ortho- recommends protected weight bearing as tolerates b/l. Use walker all the time. PT/OT. Needs outpatient Ortho Oncology (no urgent inptn surgical indication for now). Appreciate his input.   Essential hypertension - On HCTZ.  IV as needed ordered  Hyperlipidemia - Zetia  Fibromyalgia - Pain control  DVT prophylaxis: lovenox Code Status: Full Code  Family  Communication:    Status is: Inpatient Remains inpatient appropriate because: she will need to work with PT/ OT and we will need to control her pain. Hopefully dc in next 1-2 days         Subjective: Feels ok, still have pain in her hip with any movement.     Examination:  General exam: Appears calm and comfortable  Respiratory system: Clear to auscultation. Respiratory effort normal. Cardiovascular system: S1 & S2 heard, RRR. No JVD, murmurs, rubs, gallops or clicks. No pedal edema. Gastrointestinal system: Abdomen is nondistended, soft and nontender. No organomegaly or masses felt. Normal bowel sounds heard. Central nervous system: Alert and oriented. No focal neurological deficits. Extremities: Symmetric 5 x 5 power. Skin: No rashes, lesions or ulcers Psychiatry: Judgement and insight appear normal. Mood & affect appropriate.     Objective: Vitals:   01/09/22 1706 01/09/22 2106 01/10/22 0106 01/10/22 0507  BP: (!) 143/78 (!) 145/60 (!) 137/59 (!) 110/53  Pulse: 96 93 94 90  Resp: 18 16 16 14   Temp: 97.9 F (36.6 C) 98.2 F (36.8 C) 97.9 F (36.6 C) 97.7 F (36.5 C)  TempSrc: Oral Oral Oral Oral  SpO2: 97% 90% 92% 90%  Weight:      Height:       No intake or output data in the 24 hours ending 01/10/22 0835 Filed Weights   01/08/22 1923  Weight: 79.4 kg     Data Reviewed:   CBC: Recent Labs  Lab 01/06/22 0558 01/08/22 1719 01/09/22 0302  WBC 7.3 8.2 8.8  NEUTROABS  --  5.8  --   HGB 13.8 13.1 13.1  HCT 40.9 38.4 38.6  MCV 97.8 97.2  96.3  PLT 301 289 109   Basic Metabolic Panel: Recent Labs  Lab 01/06/22 0558 01/08/22 1719 01/09/22 0302  NA 137 138 138  K 3.4* 3.7 3.6  CL 101 100 102  CO2 26 26 22   GLUCOSE 130* 108* 117*  BUN 13 17 13   CREATININE 0.61 0.61 0.55  CALCIUM 9.8 9.4 9.7   GFR: Estimated Creatinine Clearance: 69.9 mL/min (by C-G formula based on SCr of 0.55 mg/dL). Liver Function Tests: No results for input(s): "AST",  "ALT", "ALKPHOS", "BILITOT", "PROT", "ALBUMIN" in the last 168 hours. No results for input(s): "LIPASE", "AMYLASE" in the last 168 hours. No results for input(s): "AMMONIA" in the last 168 hours. Coagulation Profile: Recent Labs  Lab 01/08/22 1719  INR 1.0   Cardiac Enzymes: No results for input(s): "CKTOTAL", "CKMB", "CKMBINDEX", "TROPONINI" in the last 168 hours. BNP (last 3 results) No results for input(s): "PROBNP" in the last 8760 hours. HbA1C: No results for input(s): "HGBA1C" in the last 72 hours. CBG: Recent Labs  Lab 01/05/22 1201  GLUCAP 136*   Lipid Profile: No results for input(s): "CHOL", "HDL", "LDLCALC", "TRIG", "CHOLHDL", "LDLDIRECT" in the last 72 hours. Thyroid Function Tests: No results for input(s): "TSH", "T4TOTAL", "FREET4", "T3FREE", "THYROIDAB" in the last 72 hours. Anemia Panel: No results for input(s): "VITAMINB12", "FOLATE", "FERRITIN", "TIBC", "IRON", "RETICCTPCT" in the last 72 hours. Sepsis Labs: No results for input(s): "PROCALCITON", "LATICACIDVEN" in the last 168 hours.  Recent Results (from the past 240 hour(s))  SARS Coronavirus 2 (TAT 6-24 hrs)     Status: None   Collection Time: 01/02/22 12:00 AM  Result Value Ref Range Status   SARS Coronavirus 2 RESULT: NEGATIVE  Final    Comment: RESULT: NEGATIVESARS-CoV-2 INTERPRETATION:A NEGATIVE  test result means that SARS-CoV-2 RNA was not present in the specimen above the limit of detection of this test. This does not preclude a possible SARS-CoV-2 infection and should not be used as the  sole basis for patient management decisions. Negative results must be combined with clinical observations, patient history, and epidemiological information. Optimum specimen types and timing for peak viral levels during infections caused by SARS-CoV-2  have not been determined. Collection of multiple specimens or types of specimens may be necessary to detect virus. Improper specimen collection and handling, sequence  variability under primers/probes, or organism present below the limit of detection may  lead to false negative results. Positive and negative predictive values of testing are highly dependent on prevalence. False negative test results are more likely when prevalence of disease is high.The expected result is NEGATIVE.Fact S heet for  Healthcare Providers: LocalChronicle.no Sheet for Patients: SalonLookup.es Reference Range - Negative          Radiology Studies: CT FEMUR LEFT WO CONTRAST  Result Date: 01/09/2022 CLINICAL DATA:  Metastatic disease evaluation. Bilateral femur. Left leg pain. Primary bronchogenic carcinoma. EXAM: CT OF THE LOWER LEFT EXTREMITY WITHOUT CONTRAST TECHNIQUE: Multidetector CT imaging of the lower left extremity was performed according to the standard protocol. RADIATION DOSE REDUCTION: This exam was performed according to the departmental dose-optimization program which includes automated exposure control, adjustment of the mA and/or kV according to patient size and/or use of iterative reconstruction technique. COMPARISON:  PET-CT 01/05/2022 FINDINGS: Bones/Joint/Cartilage Note is made on recent PET-CT of numerous hypermetabolic lytic bone metastases within the axial and appendicular skeleton. On the current CT, there are numerous lytic lesions seen throughout the visualized pelvis. A reference predominantly lytic lesion within the posterior left ilium was more completely  seen on recent PET-CT and measures up to approximately 2.5 x 5.0 cm in greatest transverse by AP dimensions (axial series 3, image 1). There is high-grade thinning of the posteromedial iliac cortex (axial image 1). Superior lateral left acetabular subcortical lytic lesion measures up to approximately 1.1 x 1.8 x 1.9 cm (transverse by AP by craniocaudal) with abutment of the lateral cortex but no definitive breakthrough (axial series 2, image 32 and  coronal series 7, image 56). There areas of patchy lytic lucency sclerosis within the left femoral head suspicious for metastatic disease involvement. Within the limitations of diffuse decreased bone mineralization, no definitive destructive lytic lesion is seen within the left intertrochanteric more distal left femur. Moderate medial compartment of the knee joint space narrowing with subchondral sclerosis and tiny peripheral medial ossicle degenerative change. Ligaments Suboptimally assessed by CT. Muscles and Tendons Mild fatty infiltration of the proximal anterior left gluteus minimus muscle body. No gross tendon tear is seen within the visualized left thigh. Soft tissues No knee joint effusion.  No soft tissue fluid collection. IMPRESSION: 1. Numerous lytic metastases within the visualized pelvis as on prior PET-CT. 2. Dominant partially visualized posterior left iliac lesion that was hypermetabolic on prior PET-CT measures up to approximately 2.5 x 5.0 cm in greatest transverse by AP dimensions. This causes high-grade thinning of the posteromedial iliac cortex. 3. Smaller superolateral left acetabular 1.9 cm lytic lesion abuts the lateral cortex without definite cortical breakthrough. Electronically Signed   By: Yvonne Kendall M.D.   On: 01/09/2022 13:51   CT FEMUR RIGHT WO CONTRAST  Result Date: 01/09/2022 CLINICAL DATA:  Metastatic disease evaluation. Bilateral femur. Left leg pain. Primary bronchogenic carcinoma. EXAM: CT OF THE LOWER RIGHT EXTREMITY WITHOUT CONTRAST TECHNIQUE: Multidetector CT imaging of the right lower extremity was performed according to the standard protocol. RADIATION DOSE REDUCTION: This exam was performed according to the departmental dose-optimization program which includes automated exposure control, adjustment of the mA and/or kV according to patient size and/or use of iterative reconstruction technique. COMPARISON:  PET-CT through the bilateral proximal femurs 01/05/2022  FINDINGS: Bones/Joint/Cartilage There again diffuse lytic metastases, seen on prior recent PET-CT to be hypermetabolic and to involve the appendicular axial skeleton. Reference lytic lesions include right iliac 1.7 x 1.3 cm lytic lesion (axial series 3, image 8) causing thinning of the anterior iliac cortex (greatest on axial series 2, image 6 where there is likely minimal pathologic breakthrough of the cortex). Right distal femoral neck and intertrochanteric to lesser trochanteric region of the right femur lucent lesion measuring up to approximately 4.1 by 2.8 by 5.1 cm (transverse by AP by craniocaudal, axial series 2, image 92 and coronal series 7, image 76). This thins the posterior distal femoral neck and the posterior lesser trochanteric cortex (axial series 3, images 84 through 91 with areas of complete cortical breakthrough. No definitive metastatic lesion is seen within the more distal right femur, however diffuse decreased bone mineralization does limit sensitivity. Ligaments Suboptimally assessed by CT. Muscles and Tendons Normal density in size of the region of the right thigh musculature. No gross tendon tear is seen. Soft tissues No soft tissue fluid collection is seen. Mild atherosclerotic calcifications are partially visualized within the pelvis. IMPRESSION: 1. Diffuse lytic metastases are again seen throughout the pelvis and proximal right femur. 2. Dominant right distal inferior femoral neck, intertrochanteric and lesser trochanteric lytic lesion again thins the medial cortex and appears to minimally breakthrough the posterior lesser trochanteric cortex. This increases susceptibility for risk  of pathologic fracture of the Mirels classification system given lower limb location, trochanteric region location, involving greater than 2/3 of the transverse bone diameter, and lytic quality. Recommend clinical evaluation for possible functional pain to complete this Mirels classification assessment.  Electronically Signed   By: Yvonne Kendall M.D.   On: 01/09/2022 13:41   CT Head Wo Contrast  Result Date: 01/08/2022 CLINICAL DATA:  Frontal headache, dural venous sinus thrombosis suspected EXAM: CT HEAD WITHOUT CONTRAST TECHNIQUE: Contiguous axial images were obtained from the base of the skull through the vertex without intravenous contrast. RADIATION DOSE REDUCTION: This exam was performed according to the departmental dose-optimization program which includes automated exposure control, adjustment of the mA and/or kV according to patient size and/or use of iterative reconstruction technique. COMPARISON:  MRI earlier today FINDINGS: Brain: No intracranial hemorrhage, mass effect, or evidence of acute infarct. No hydrocephalus. No extra-axial fluid collection. Vascular: Hyperdensity within the straight sinus (series 7/image 29) compatible with thrombosis as seen on MRI earlier today. Skull: No fracture or focal lesion. Sinuses/Orbits: No acute finding. Paranasal sinuses and mastoid air cells are well aerated. Other: None. IMPRESSION: Hyperdensity in the straight sinus compatible with thrombus as seen on MRI earlier today. No evidence of hemorrhage or acute infarct. Electronically Signed   By: Placido Sou M.D.   On: 01/08/2022 18:51   MR BRAIN W WO CONTRAST  Result Date: 01/08/2022 CLINICAL DATA:  Non-small cell lung cancer (NSCLC), staging EXAM: MRI HEAD WITHOUT AND WITH CONTRAST TECHNIQUE: Multiplanar, multiecho pulse sequences of the brain and surrounding structures were obtained without and with intravenous contrast. CONTRAST:  11mL GADAVIST GADOBUTROL 1 MMOL/ML IV SOLN COMPARISON:  None Available. FINDINGS: Brain: There is no acute infarction or intracranial hemorrhage. There is no intracranial mass, mass effect, or edema. There is no hydrocephalus or extra-axial fluid collection. Ventricles and sulci are within normal limits in size and configuration. Minimal punctate foci of T2 hyperintensity in the  supratentorial white matter nonspecific but may reflect minor chronic microvascular ischemic changes. No abnormal enhancement. Vascular: There is thrombus within the proximal to mid straight sinus. Remainder of dural venous sinuses are patent. Major vessel flow voids at the skull base are preserved. Skull and upper cervical spine: Normal marrow signal is preserved. Partially imaged susceptibility artifact related to cervical spine fusion. Sinuses/Orbits: Paranasal sinuses are aerated. Orbits are unremarkable. Other: Sella is unremarkable.  Mastoid air cells are clear. IMPRESSION: Thrombus within the straight sinus. No evidence of intracranial metastatic disease. These results will be called to the ordering clinician or representative by the Radiologist Assistant, and communication documented in the PACS or [employer]. Electronically Signed   By: Macy Mis M.D.   On: 01/08/2022 11:55        Scheduled Meds:  enoxaparin (LOVENOX) injection  80 mg Subcutaneous Q12H   ezetimibe  10 mg Oral Daily   hydrochlorothiazide  12.5 mg Oral Daily   Continuous Infusions:   LOS: 1 day   Time spent= 35 mins    Kayven Aldaco Arsenio Loader, MD [facility]  If 7PM-7AM, please contact night-coverage  01/10/2022, 8:35 AM

## 2022-01-11 DIAGNOSIS — I8290 Acute embolism and thrombosis of unspecified vein: Secondary | ICD-10-CM | POA: Diagnosis not present

## 2022-01-11 LAB — CBC
HCT: 40.5 % (ref 36.0–46.0)
Hemoglobin: 13.4 g/dL (ref 12.0–15.0)
MCH: 32.5 pg (ref 26.0–34.0)
MCHC: 33.1 g/dL (ref 30.0–36.0)
MCV: 98.3 fL (ref 80.0–100.0)
Platelets: 278 10*3/uL (ref 150–400)
RBC: 4.12 MIL/uL (ref 3.87–5.11)
RDW: 13 % (ref 11.5–15.5)
WBC: 7.5 10*3/uL (ref 4.0–10.5)
nRBC: 0 % (ref 0.0–0.2)

## 2022-01-11 LAB — BASIC METABOLIC PANEL
Anion gap: 9 (ref 5–15)
BUN: 16 mg/dL (ref 8–23)
CO2: 26 mmol/L (ref 22–32)
Calcium: 9.9 mg/dL (ref 8.9–10.3)
Chloride: 103 mmol/L (ref 98–111)
Creatinine, Ser: 0.56 mg/dL (ref 0.44–1.00)
GFR, Estimated: >60 mL/min (ref >60)
Glucose, Bld: 136 mg/dL — ABNORMAL HIGH (ref 70–99)
Potassium: 4 mmol/L (ref 3.5–5.1)
Sodium: 138 mmol/L (ref 135–145)

## 2022-01-11 LAB — MAGNESIUM: Magnesium: 2.2 mg/dL (ref 1.7–2.4)

## 2022-01-11 LAB — FOLATE: Folate: 21.3 ng/mL (ref >5.9)

## 2022-01-11 MED ORDER — PANTOPRAZOLE SODIUM 40 MG PO TBEC
40.0000 mg | DELAYED_RELEASE_TABLET | Freq: Every day | ORAL | Status: DC
  Administered 2022-01-11 – 2022-01-25 (×15): 40 mg via ORAL
  Filled 2022-01-11 (×15): qty 1

## 2022-01-11 MED ORDER — DEXAMETHASONE 4 MG PO TABS
4.0000 mg | ORAL_TABLET | Freq: Every day | ORAL | Status: DC
  Administered 2022-01-11 – 2022-01-25 (×15): 4 mg via ORAL
  Filled 2022-01-11 (×15): qty 1

## 2022-01-11 MED ORDER — ALUM & MAG HYDROXIDE-SIMETH 200-200-20 MG/5ML PO SUSP
15.0000 mL | Freq: Four times a day (QID) | ORAL | Status: DC | PRN
  Administered 2022-01-11 – 2022-01-26 (×10): 15 mL via ORAL
  Filled 2022-01-11 (×11): qty 30

## 2022-01-11 MED ORDER — FENTANYL 25 MCG/HR TD PT72
1.0000 | MEDICATED_PATCH | TRANSDERMAL | Status: DC
  Administered 2022-01-11: 1 via TRANSDERMAL
  Filled 2022-01-11: qty 1

## 2022-01-11 MED ORDER — FENTANYL CITRATE PF 50 MCG/ML IJ SOSY
12.5000 ug | PREFILLED_SYRINGE | INTRAMUSCULAR | Status: DC | PRN
  Administered 2022-01-11: 12.5 ug via INTRAVENOUS
  Filled 2022-01-11: qty 1

## 2022-01-11 MED ORDER — FENTANYL CITRATE PF 50 MCG/ML IJ SOSY
25.0000 ug | PREFILLED_SYRINGE | INTRAMUSCULAR | Status: DC | PRN
  Administered 2022-01-11 – 2022-01-13 (×7): 25 ug via INTRAVENOUS
  Filled 2022-01-11 (×7): qty 1

## 2022-01-11 MED ORDER — KATE FARMS STANDARD 1.4 PO LIQD
325.0000 mL | Freq: Two times a day (BID) | ORAL | Status: DC
  Administered 2022-01-11 – 2022-01-28 (×33): 325 mL via ORAL
  Filled 2022-01-11 (×35): qty 325

## 2022-01-11 MED ORDER — FENTANYL CITRATE PF 50 MCG/ML IJ SOSY
25.0000 ug | PREFILLED_SYRINGE | Freq: Four times a day (QID) | INTRAMUSCULAR | Status: DC | PRN
  Administered 2022-01-11: 25 ug via INTRAVENOUS
  Filled 2022-01-11: qty 1

## 2022-01-11 NOTE — Progress Notes (Signed)
Pt unable to void.  Bladder scanned >569ml.  Orders to intermittent cath once.    Output 700 ml of amber urine.    Pt stated "I feel a little bit better with pain, but my left leg still has a shooting pain and numbness down the side." MD aware.    Will continue to monitor.

## 2022-01-11 NOTE — Progress Notes (Signed)
Initial Nutrition Assessment  INTERVENTION:   [employer] 1.4 PO BID, each provides 455 kcals and 20g protein  NUTRITION DIAGNOSIS:   Increased nutrient needs related to cancer and cancer related treatments as evidenced by estimated needs.  GOAL:   Patient will meet greater than or equal to 90% of their needs  MONITOR:   PO intake, Supplement acceptance, Labs, Weight trends, I & O's  REASON FOR ASSESSMENT:   Malnutrition Screening Tool    ASSESSMENT:   66 year old with history of  HTN, HLD, fibromyalgia and recent diagnosis of lung mass with metastatic disease and outpatient MRI brain done which showed sinus thrombosis therefore admitted to the hospital.  Patient was started on anticoagulation, neurology team was consulted.  Patient was transferred to [facility] from [facility] for radiation oncology evaluation who placed [facility] 8/4 with plans for 10 radiation treatments per Dr Tammi Klippel.  Patient current consuming 25-100% of meals. Noted allergy to beef and pork derived products. Will order plant based protein [employer] supplement for additional kcals and protein.   Per weight records, pt has lost 21 lbs since 07/07/21 (10% wt loss x 6.5 months, insignificant for time frame).  Per nursing documentation, pt with mild BLE edema.  Medications: Colace  Labs reviewed.  NUTRITION - FOCUSED PHYSICAL EXAM:  Unable to complete, RD working remotely.  Diet Order:   Diet Order             Diet Heart Room service appropriate? Yes; Fluid consistency: Thin  Diet effective now                   EDUCATION NEEDS:   No education needs have been identified at this time  Skin:  Skin Assessment: Reviewed RN Assessment  Last BM:  8/4  Height:   Ht Readings from Last 1 Encounters:  01/08/22 5\' 3"  (1.6 m)    Weight:   Wt Readings from Last 1 Encounters:  01/08/22 79.4 kg    BMI:  Body mass index is 31 kg/m.  Estimated Nutritional Needs:   Kcal:   1600-1800  Protein:  75-90g  Fluid:  1.8L/day  Clayton Bibles, MS, RD, LDN Inpatient Clinical Dietitian Contact information available via Amion

## 2022-01-11 NOTE — Progress Notes (Signed)
Noted patient assisted to [facility]. After 12 minutes pt reports she is unable to void , but is feeling the pressure.SHARED WITH THE PATIENT THAT I WILL need her to lay on her back to allow me to scan her bladder. Pt requested that I post pone doing the bladder scan and she will try later to void. Noted to on coming RN

## 2022-01-11 NOTE — Progress Notes (Signed)
Foley catheter placed successfully.   Output 500 cc output of yellow/amber urine.   Pt feels relief in bladder now.

## 2022-01-11 NOTE — Plan of Care (Signed)
Noted patient has c/o pain that has not been relieved by current medical regime. Call placed to on call and new med orders obtained. Medication administered Problem: Pain Managment: Goal: General experience of comfort will improve Outcome: Progressing   Problem: Pain Managment: Goal: General experience of comfort will improve Outcome: Progressing   Problem: Education: Goal: Knowledge of General Education information will improve Description: Including pain rating scale, medication(s)/side effects and non-pharmacologic comfort measures Outcome: Progressing   Problem: Education: Goal: Knowledge of General Education information will improve Description: Including pain rating scale, medication(s)/side effects and non-pharmacologic comfort measures Outcome: Progressing

## 2022-01-11 NOTE — Progress Notes (Signed)
PROGRESS NOTE    Misty Decker  GGE:[id number] DOB: 1956/05/22 DOA: 01/08/2022 PCP: Lorrene Reid, PA-C   Brief Narrative:  66 year old with history of  HTN, HLD, fibromyalgia and recent diagnosis of lung mass with metastatic disease and outpatient MRI brain done which showed sinus thrombosis therefore admitted to the hospital.  Patient was started on anticoagulation, neurology team was consulted.  Patient was transferred to [facility] long hospital from [facility] for radiation oncology evaluation who placed [facility] 8/4 with plans for 10 radiation treatments per Dr Tammi Klippel.  Discussed care with orthopedic who recommended protected weightbearing bilaterally, use walker all the time.   Assessment & Plan:  Principal Problem:   VTE (venous thromboembolism) Active Problems:   Essential hypertension   Blood clots in brain    Straight sinus thrombosis -Seen by neurology team who has recommended anticoagulation.  Currently patient on Lovenox every 12 hours.  Plans to transition to oral NOAC. Spoke with Dr Burr Medico - ok for Xarelto or Eliquis.   Metastatic lung mass with adenopathy Metastatic bone lesions, lytic right femur lesion - Underwent bronchoscopy with EBUS on 8/1 by pulmonary, biopsies performed.  Cytology consistent with adenocarcinoma -Patient transferred to [facility] from [facility] at the request of radiation oncology.  SIM placed 8/4.  Dr. Tammi Klippel plans on 10 radiation treatments but can be done outpatient.  Spoke with Dr Lyla Glassing From Ortho- recommends protected weight bearing as tolerates b/l. Use walker all the time. PT/OT. Needs outpatient Ortho Oncology (no urgent inptn surgical indication for now). Appreciate his input. -Pain management.  We will add fentanyl patch.  Oxycodone as needed. Add Decadron daily. Allergy to NSAIDs. Hx of Ulcers.   Essential hypertension - On HCTZ.  IV as needed ordered  Hyperlipidemia - Zetia  Fibromyalgia - Pain control  Acute urinary  retention >500cc Straight cath, if persist will need foley.   DVT prophylaxis: lovenox Code Status: Full Code  Family Communication:    Status is: Inpatient Remains inpatient appropriate because: PT/OT to work with patient, pain control as she mobilizes more.    Subjective: Feels ok, but lots of pain with movement.  Urinary retention >500cc this morning.   Examination: Constitutional: Chronically ill appearing.  Respiratory: Clear to auscultation bilaterally Cardiovascular: Normal sinus rhythm, no rubs Abdomen: Nontender nondistended good bowel sounds Musculoskeletal: No edema noted Skin: No rashes seen Neurologic: CN 2-12 grossly intact.  And nonfocal Psychiatric: Normal judgment and insight. Alert and oriented x 3. Normal mood.     Objective: Vitals:   01/10/22 0507 01/10/22 1415 01/10/22 2249 01/11/22 0659  BP: (!) 110/53 (!) 121/55 128/67 120/66  Pulse: 90 95 97 (!) 101  Resp: 14 16 16 18   Temp: 97.7 F (36.5 C) 97.7 F (36.5 C) 98.1 F (36.7 C) 98 F (36.7 C)  TempSrc: Oral Oral Oral Oral  SpO2: 90% 94% 94% 93%  Weight:      Height:        Intake/Output Summary (Last 24 hours) at 01/11/2022 0824 Last data filed at 01/11/2022 0249 Gross per 24 hour  Intake 340 ml  Output 700 ml  Net -360 ml   Filed Weights   01/08/22 1923  Weight: 79.4 kg     Data Reviewed:   CBC: Recent Labs  Lab 01/06/22 0558 01/08/22 1719 01/09/22 0302 01/11/22 0630  WBC 7.3 8.2 8.8 7.5  NEUTROABS  --  5.8  --   --   HGB 13.8 13.1 13.1 13.4  HCT 40.9 38.4 38.6  40.5  MCV 97.8 97.2 96.3 98.3  PLT 301 289 284 786   Basic Metabolic Panel: Recent Labs  Lab 01/06/22 0558 01/08/22 1719 01/09/22 0302 01/11/22 0630  NA 137 138 138 138  K 3.4* 3.7 3.6 4.0  CL 101 100 102 103  CO2 26 26 22 26   GLUCOSE 130* 108* 117* 136*  BUN 13 17 13 16   CREATININE 0.61 0.61 0.55 0.56  CALCIUM 9.8 9.4 9.7 9.9  MG  --   --   --  2.2   GFR: Estimated Creatinine Clearance: 69.9 mL/min  (by C-G formula based on SCr of 0.56 mg/dL). Liver Function Tests: No results for input(s): "AST", "ALT", "ALKPHOS", "BILITOT", "PROT", "ALBUMIN" in the last 168 hours. No results for input(s): "LIPASE", "AMYLASE" in the last 168 hours. No results for input(s): "AMMONIA" in the last 168 hours. Coagulation Profile: Recent Labs  Lab 01/08/22 1719  INR 1.0   Cardiac Enzymes: No results for input(s): "CKTOTAL", "CKMB", "CKMBINDEX", "TROPONINI" in the last 168 hours. BNP (last 3 results) No results for input(s): "PROBNP" in the last 8760 hours. HbA1C: Recent Labs    01/10/22 0816  HGBA1C 5.6   CBG: Recent Labs  Lab 01/05/22 1201  GLUCAP 136*   Lipid Profile: Recent Labs    01/10/22 0816  CHOL 165  HDL 36*  LDLCALC 87  TRIG 210*  CHOLHDL 4.6   Thyroid Function Tests: No results for input(s): "TSH", "T4TOTAL", "FREET4", "T3FREE", "THYROIDAB" in the last 72 hours. Anemia Panel: Recent Labs    01/10/22 0915 01/11/22 0630  VITAMINB12 1,112*  --   FOLATE  --  21.3   Sepsis Labs: No results for input(s): "PROCALCITON", "LATICACIDVEN" in the last 168 hours.  Recent Results (from the past 240 hour(s))  SARS Coronavirus 2 (TAT 6-24 hrs)     Status: None   Collection Time: 01/02/22 12:00 AM  Result Value Ref Range Status   SARS Coronavirus 2 RESULT: NEGATIVE  Final    Comment: RESULT: NEGATIVESARS-CoV-2 INTERPRETATION:A NEGATIVE  test result means that SARS-CoV-2 RNA was not present in the specimen above the limit of detection of this test. This does not preclude a possible SARS-CoV-2 infection and should not be used as the  sole basis for patient management decisions. Negative results must be combined with clinical observations, patient history, and epidemiological information. Optimum specimen types and timing for peak viral levels during infections caused by SARS-CoV-2  have not been determined. Collection of multiple specimens or types of specimens may be necessary to  detect virus. Improper specimen collection and handling, sequence variability under primers/probes, or organism present below the limit of detection may  lead to false negative results. Positive and negative predictive values of testing are highly dependent on prevalence. False negative test results are more likely when prevalence of disease is high.The expected result is NEGATIVE.Fact S heet for  Healthcare Providers: [email or web address].no Sheet for Patients: [email or web address].es Reference Range - Negative          Radiology Studies: CT FEMUR LEFT WO CONTRAST  Result Date: 01/09/2022 CLINICAL DATA:  Metastatic disease evaluation. Bilateral femur. Left leg pain. Primary bronchogenic carcinoma. EXAM: CT OF THE LOWER LEFT EXTREMITY WITHOUT CONTRAST TECHNIQUE: Multidetector CT imaging of the lower left extremity was performed according to the standard protocol. RADIATION DOSE REDUCTION: This exam was performed according to the departmental dose-optimization program which includes automated exposure control, adjustment of the mA and/or kV according to patient size and/or use of iterative reconstruction technique. COMPARISON:  PET-CT  01/05/2022 FINDINGS: Bones/Joint/Cartilage Note is made on recent PET-CT of numerous hypermetabolic lytic bone metastases within the axial and appendicular skeleton. On the current CT, there are numerous lytic lesions seen throughout the visualized pelvis. A reference predominantly lytic lesion within the posterior left ilium was more completely seen on recent PET-CT and measures up to approximately 2.5 x 5.0 cm in greatest transverse by AP dimensions (axial series 3, image 1). There is high-grade thinning of the posteromedial iliac cortex (axial image 1). Superior lateral left acetabular subcortical lytic lesion measures up to approximately 1.1 x 1.8 x 1.9 cm (transverse by AP by craniocaudal) with abutment of the lateral cortex  but no definitive breakthrough (axial series 2, image 32 and coronal series 7, image 56). There areas of patchy lytic lucency sclerosis within the left femoral head suspicious for metastatic disease involvement. Within the limitations of diffuse decreased bone mineralization, no definitive destructive lytic lesion is seen within the left intertrochanteric more distal left femur. Moderate medial compartment of the knee joint space narrowing with subchondral sclerosis and tiny peripheral medial ossicle degenerative change. Ligaments Suboptimally assessed by CT. Muscles and Tendons Mild fatty infiltration of the proximal anterior left gluteus minimus muscle body. No gross tendon tear is seen within the visualized left thigh. Soft tissues No knee joint effusion.  No soft tissue fluid collection. IMPRESSION: 1. Numerous lytic metastases within the visualized pelvis as on prior PET-CT. 2. Dominant partially visualized posterior left iliac lesion that was hypermetabolic on prior PET-CT measures up to approximately 2.5 x 5.0 cm in greatest transverse by AP dimensions. This causes high-grade thinning of the posteromedial iliac cortex. 3. Smaller superolateral left acetabular 1.9 cm lytic lesion abuts the lateral cortex without definite cortical breakthrough. Electronically Signed   By: Yvonne Kendall M.D.   On: 01/09/2022 13:51   CT FEMUR RIGHT WO CONTRAST  Result Date: 01/09/2022 CLINICAL DATA:  Metastatic disease evaluation. Bilateral femur. Left leg pain. Primary bronchogenic carcinoma. EXAM: CT OF THE LOWER RIGHT EXTREMITY WITHOUT CONTRAST TECHNIQUE: Multidetector CT imaging of the right lower extremity was performed according to the standard protocol. RADIATION DOSE REDUCTION: This exam was performed according to the departmental dose-optimization program which includes automated exposure control, adjustment of the mA and/or kV according to patient size and/or use of iterative reconstruction technique. COMPARISON:   PET-CT through the bilateral proximal femurs 01/05/2022 FINDINGS: Bones/Joint/Cartilage There again diffuse lytic metastases, seen on prior recent PET-CT to be hypermetabolic and to involve the appendicular axial skeleton. Reference lytic lesions include right iliac 1.7 x 1.3 cm lytic lesion (axial series 3, image 8) causing thinning of the anterior iliac cortex (greatest on axial series 2, image 6 where there is likely minimal pathologic breakthrough of the cortex). Right distal femoral neck and intertrochanteric to lesser trochanteric region of the right femur lucent lesion measuring up to approximately 4.1 by 2.8 by 5.1 cm (transverse by AP by craniocaudal, axial series 2, image 92 and coronal series 7, image 76). This thins the posterior distal femoral neck and the posterior lesser trochanteric cortex (axial series 3, images 84 through 91 with areas of complete cortical breakthrough. No definitive metastatic lesion is seen within the more distal right femur, however diffuse decreased bone mineralization does limit sensitivity. Ligaments Suboptimally assessed by CT. Muscles and Tendons Normal density in size of the region of the right thigh musculature. No gross tendon tear is seen. Soft tissues No soft tissue fluid collection is seen. Mild atherosclerotic calcifications are partially visualized within the pelvis.  IMPRESSION: 1. Diffuse lytic metastases are again seen throughout the pelvis and proximal right femur. 2. Dominant right distal inferior femoral neck, intertrochanteric and lesser trochanteric lytic lesion again thins the medial cortex and appears to minimally breakthrough the posterior lesser trochanteric cortex. This increases susceptibility for risk of pathologic fracture of the Mirels classification system given lower limb location, trochanteric region location, involving greater than 2/3 of the transverse bone diameter, and lytic quality. Recommend clinical evaluation for possible functional pain  to complete this Mirels classification assessment. Electronically Signed   By: Yvonne Kendall M.D.   On: 01/09/2022 13:41        Scheduled Meds:  docusate sodium  100 mg Oral BID   enoxaparin (LOVENOX) injection  80 mg Subcutaneous Q12H   ezetimibe  10 mg Oral Daily   fentaNYL  1 patch Transdermal Q72H   hydrochlorothiazide  12.5 mg Oral Daily   Continuous Infusions:   LOS: 2 days   Time spent= 35 mins    Dee Paden Arsenio Loader, MD [facility]  If 7PM-7AM, please contact night-coverage  01/11/2022, 8:24 AM

## 2022-01-11 NOTE — Evaluation (Signed)
Physical Therapy Evaluation Patient Details Name: Misty Decker MRN: [id number] DOB: 1955/07/13 Today's Date: 01/11/2022  History of Present Illness  Pt admitted from home 2* sinus thrombus found on MRI.  Pt with recent dx of lung mass and with multiple metastatic bone lesions in pelvis and R proximal femur with possible spinal mets and L2 compression fx.  Clinical Impression  Pt admitted as above and presenting with functional mobility limitations 2* generalized weakness, balance deficits and pain exacerbated by mobility and attempts to WB (pt reports pain greater in Left vs Right hip/thigh vs).  This date pt up to bedside and to stand x 2 and side-step up side of bed only.  Pt reports increased time in WB causing stabbing pains L hip area and states LEs will buckle if she stays up longer.  Pt hopes to progress to dc home with assist of very supportive family/friends but expresses concern about having been relegated to sleeping on the sofa as it allows her to utilize the arms and back to assist in mobility.  Pt could benefit from use of hospital bed for home use.  Pt is investigating if church has [facility] available for her use.  Pt will benefit from acute stay and HHPT follow up to maximize mobility and IND within pain tolerance.     Recommendations for follow up therapy are one component of a multi-disciplinary discharge planning process, led by the attending physician.  Recommendations may be updated based on patient status, additional functional criteria and insurance authorization.  Follow Up Recommendations Home health PT      Assistance Recommended at Discharge Frequent or constant Supervision/Assistance  Patient can return home with the following  A lot of help with walking and/or transfers;A little help with bathing/dressing/bathroom;Assistance with cooking/housework;Assist for transportation;Help with stairs or ramp for entrance    Equipment Recommendations Hospital bed   Recommendations for Other Services       Functional Status Assessment Patient has had a recent decline in their functional status and demonstrates the ability to make significant improvements in function in a reasonable and predictable amount of time.     Precautions / Restrictions Precautions Precautions: Fall Precaution Comments: Pt reports mobility pain limited and legs can buckle if up too long Restrictions Weight Bearing Restrictions: Yes LLE Weight Bearing: Weight bearing as tolerated Other Position/Activity Restrictions: WBAT with use of RW      Mobility  Bed Mobility Overal bed mobility: Needs Assistance Bed Mobility: Supine to Sit, Sit to Supine     Supine to sit: Min guard Sit to supine: Min assist, Mod assist   General bed mobility comments: Increased time and use of bed rail; physical assist to manage LEs back into bed    Transfers Overall transfer level: Needs assistance Equipment used: Rolling walker (2 wheels) Transfers: Sit to/from Stand Sit to Stand: Min assist, +2 safety/equipment           General transfer comment: cues for use of UEs to self assist.  Physical assist to bring wt up and fwd and balance in initial standing with RW.    Ambulation/Gait Ambulation/Gait assistance: Min assist, +2 safety/equipment Gait Distance (Feet): 3 Feet Assistive device: Rolling walker (2 wheels) Gait Pattern/deviations: Step-to pattern, Decreased step length - right, Decreased step length - left, Shuffle, Trunk flexed Gait velocity: decr     General Gait Details: Pt stood briefly at side of bed but with increasing pain returned to sitting.  Pt able to stand a second time to  side step up to top of bed with RW and min assist and returned to sitting 2* pain  Stairs            Wheelchair Mobility    Modified Rankin (Stroke Patients Only)       Balance Overall balance assessment: Needs assistance Sitting-balance support: No upper extremity supported,  Feet supported Sitting balance-Leahy Scale: Fair     Standing balance support: Bilateral upper extremity supported Standing balance-Leahy Scale: Poor Standing balance comment: Pt reliant on UEs on RW for support                             Pertinent Vitals/Pain Pain Assessment Pain Assessment: 0-10 Pain Score: 8  Pain Location: L hip/thigh exacerbated with mobility and WB Pain Descriptors / Indicators: Grimacing, Guarding, Aching Pain Intervention(s): Limited activity within patient's tolerance, Monitored during session, Premedicated before session, Patient requesting pain meds-RN notified    Home Living Family/patient expects to be discharged to:: Private residence Living Arrangements: Spouse/significant other;Children Available Help at Discharge: Family Type of Home: House Home Access: Stairs to enter Entrance Stairs-Rails: Psychiatric nurse of Steps: 3   Home Layout: One level Home Equipment: Conservation officer, nature (2 wheels);Cane - single point;Tub bench;Transport chair (Pt checking with church on availability of [facility]) Additional Comments: Pt states she has a lot of assistance from family and church family    Prior Function Prior Level of Function : Needs assist             Mobility Comments: Pt reports using RW for limited ambulation and has been sleeping on sofa to use back and arms to facilitate mobility       Hand Dominance        Extremity/Trunk Assessment   Upper Extremity Assessment Upper Extremity Assessment: Overall WFL for tasks assessed    Lower Extremity Assessment Lower Extremity Assessment: RLE deficits/detail;LLE deficits/detail;Generalized weakness RLE: Unable to fully assess due to pain LLE: Unable to fully assess due to pain       Communication   Communication: No difficulties  Cognition Arousal/Alertness: Awake/alert Behavior During Therapy: WFL for tasks assessed/performed Overall Cognitive Status: Within  Functional Limits for tasks assessed                                          General Comments      Exercises     Assessment/Plan    PT Assessment Patient needs continued PT services  PT Problem List Decreased strength;Decreased activity tolerance;Decreased balance;Decreased mobility;Decreased knowledge of use of DME;Pain       PT Treatment Interventions DME instruction;Gait training;Stair training;Therapeutic exercise;Therapeutic activities;Functional mobility training;Balance training;Patient/family education    PT Goals (Current goals can be found in the Care Plan section)  Acute Rehab PT Goals Patient Stated Goal: Less pain, more independence PT Goal Formulation: With patient Time For Goal Achievement: 01/25/22 Potential to Achieve Goals: Fair    Frequency Min 3X/week     Co-evaluation               AM-PAC PT "6 Clicks" Mobility  Outcome Measure Help needed turning from your back to your side while in a flat bed without using bedrails?: A Little Help needed moving from lying on your back to sitting on the side of a flat bed without using bedrails?: A Little Help needed moving  to and from a bed to a chair (including a wheelchair)?: A Lot Help needed standing up from a chair using your arms (e.g., wheelchair or bedside chair)?: A Little Help needed to walk in hospital room?: Total Help needed climbing 3-5 steps with a railing? : Total 6 Click Score: 13    End of Session Equipment Utilized During Treatment: Gait belt Activity Tolerance: Patient limited by pain Patient left: in bed;with call bell/phone within reach;with bed alarm set Nurse Communication: Mobility status;Patient requests pain meds PT Visit Diagnosis: Unsteadiness on feet (R26.81);Muscle weakness (generalized) (M62.81);Difficulty in walking, not elsewhere classified (R26.2);Pain Pain - Right/Left: Left Pain - part of body: Hip;Leg    Time: 2751-7001 PT Time Calculation (min)  (ACUTE ONLY): 22 min   Charges:   PT Evaluation $PT Eval Low Complexity: 1 Low          Debe Coder PT Acute Rehabilitation Services Pager [phone] Office [phone]   Dondi Aime 01/11/2022, 12:31 PM

## 2022-01-12 ENCOUNTER — Other Ambulatory Visit: Payer: Self-pay

## 2022-01-12 ENCOUNTER — Ambulatory Visit
Admit: 2022-01-12 | Discharge: 2022-01-12 | Disposition: A | Discharge status: Home / Self Care | Payer: Medicare Other | Attending: Radiation Oncology | Admitting: Radiation Oncology

## 2022-01-12 DIAGNOSIS — I8290 Acute embolism and thrombosis of unspecified vein: Secondary | ICD-10-CM | POA: Diagnosis not present

## 2022-01-12 LAB — RAD ONC ARIA SESSION SUMMARY
Course Elapsed Days: 0
Plan Fractions Treated to Date: 1
Plan Fractions Treated to Date: 1
Plan Fractions Treated to Date: 1
Plan Prescribed Dose Per Fraction: 3 Gy
Plan Prescribed Dose Per Fraction: 3 Gy
Plan Prescribed Dose Per Fraction: 3 Gy
Plan Total Fractions Prescribed: 10
Plan Total Fractions Prescribed: 10
Plan Total Fractions Prescribed: 10
Plan Total Prescribed Dose: 30 Gy
Plan Total Prescribed Dose: 30 Gy
Plan Total Prescribed Dose: 30 Gy
Reference Point Dosage Given to Date: 3 Gy
Reference Point Dosage Given to Date: 3 Gy
Reference Point Dosage Given to Date: 3 Gy
Reference Point Session Dosage Given: 3 Gy
Reference Point Session Dosage Given: 3 Gy
Reference Point Session Dosage Given: 3 Gy
Session Number: 1

## 2022-01-12 LAB — CBC
HCT: 39.5 % (ref 36.0–46.0)
Hemoglobin: 13.4 g/dL (ref 12.0–15.0)
MCH: 32.9 pg (ref 26.0–34.0)
MCHC: 33.9 g/dL (ref 30.0–36.0)
MCV: 97.1 fL (ref 80.0–100.0)
Platelets: 287 10*3/uL (ref 150–400)
RBC: 4.07 MIL/uL (ref 3.87–5.11)
RDW: 12.7 % (ref 11.5–15.5)
WBC: 6.4 10*3/uL (ref 4.0–10.5)
nRBC: 0 % (ref 0.0–0.2)

## 2022-01-12 LAB — BASIC METABOLIC PANEL
Anion gap: 9 (ref 5–15)
BUN: 17 mg/dL (ref 8–23)
CO2: 27 mmol/L (ref 22–32)
Calcium: 9.5 mg/dL (ref 8.9–10.3)
Chloride: 103 mmol/L (ref 98–111)
Creatinine, Ser: 0.53 mg/dL (ref 0.44–1.00)
GFR, Estimated: >60 mL/min (ref >60)
Glucose, Bld: 188 mg/dL — ABNORMAL HIGH (ref 70–99)
Potassium: 4.1 mmol/L (ref 3.5–5.1)
Sodium: 139 mmol/L (ref 135–145)

## 2022-01-12 LAB — MAGNESIUM: Magnesium: 2.2 mg/dL (ref 1.7–2.4)

## 2022-01-12 MED ORDER — TAMSULOSIN HCL 0.4 MG PO CAPS
0.4000 mg | ORAL_CAPSULE | Freq: Every day | ORAL | Status: DC
  Administered 2022-01-12 – 2022-01-28 (×17): 0.4 mg via ORAL
  Filled 2022-01-12 (×17): qty 1

## 2022-01-12 MED ORDER — FENTANYL 50 MCG/HR TD PT72
1.0000 | MEDICATED_PATCH | TRANSDERMAL | Status: DC
  Administered 2022-01-12 – 2022-01-15 (×2): 1 via TRANSDERMAL
  Filled 2022-01-12 (×2): qty 1

## 2022-01-12 NOTE — Progress Notes (Signed)
PROGRESS NOTE    [facility]  FGH:[id number] DOB: Feb 24, 1956 DOA: 01/08/2022 PCP: Misty Reid, PA-C   Brief Narrative:  66 year old with history of  HTN, HLD, fibromyalgia and recent diagnosis of lung mass with metastatic disease and outpatient MRI brain done which showed sinus thrombosis therefore admitted to the [facility].  Patient was started on anticoagulation, neurology team was consulted.  [facility] was [facility] for radiation oncology evaluation who placed [facility] 8/4 with plans for 10 radiation treatments per [facility].  Discussed care with orthopedic who recommended protected weightbearing bilaterally, use walker all the time.  Palliative care consulted for pain management.   Assessment & Plan:  Principal Problem:   VTE (venous thromboembolism) Active Problems:   Essential hypertension   Blood clots in brain    Straight sinus thrombosis -Seen by neurology team who has recommended anticoagulation.  Currently patient on Lovenox every 12 hours.  Plans to transition to oral NOAC. Spoke with [facility] - ok for Xarelto or Eliquis upon discharge  Metastatic lung mass with adenopathy Metastatic bone lesions, lytic right femur lesion - Underwent bronchoscopy with EBUS on 8/1 by pulmonary, biopsies performed.  Cytology consistent with adenocarcinoma -Patient [facility] at the request of radiation oncology.  SIM placed 8/4.  Misty. Tammi [facility] plans on 10 radiation treatments but can be done outpatient.  Spoke with Misty [facility] From Ortho- recommends protected weight bearing as tolerates b/l. Use walker all the time. PT/OT. Needs outpatient Ortho Oncology (no urgent inptn surgical indication for now). Appreciate his input. -Pain management.  On fentanyl patch (will increase today), getting fentanyl IV and oxycodone IR as needed.  Palliative care consulted  Essential hypertension - On HCTZ.  IV as needed  ordered  Hyperlipidemia - Zetia  Fibromyalgia - Pain control  Acute urinary retention >500cc Persisting urinary retention, Foley catheter placed.  Add Flomax  DVT prophylaxis: lovenox Code Status: Full Code  Family Communication:    Status is: Inpatient Remains inpatient appropriate because: PT/OT to work with patient, pain control as she mobilizes more.    Subjective: Still has lots of pain needing around the clock pain medications.  Remains afebrile otherwise  Examination: Constitutional: Not in acute distress Respiratory: Clear to auscultation bilaterally Cardiovascular: Normal sinus rhythm, no rubs Abdomen: Nontender nondistended good bowel sounds Musculoskeletal: No edema noted Skin: No rashes seen Neurologic: CN 2-12 grossly intact.  And nonfocal Psychiatric: Normal judgment and insight. Alert and oriented x 3. Normal mood.    Objective: Vitals:   01/11/22 0659 01/11/22 1400 01/11/22 2141 01/12/22 0532  BP: 120/66 119/65 130/69 (!) 129/55  Pulse: (!) 101 86 92 81  Resp: 18 16 16 14   Temp: 98 F (36.7 C) 98.1 F (36.7 C) 97.9 F (36.6 C) (!) 97.5 F (36.4 C)  TempSrc: Oral Oral Oral Oral  SpO2: 93% 92% 95% 91%  Weight:      Height:        Intake/Output Summary (Last 24 hours) at 01/12/2022 0803 Last data filed at 01/12/2022 0535 Gross per 24 hour  Intake 240 ml  Output 1750 ml  Net -1510 ml   Filed Weights   01/08/22 1923  Weight: 79.4 kg     Data Reviewed:   CBC: Recent Labs  Lab 01/06/22 0558 01/08/22 1719 01/09/22 0302 01/11/22 0630 01/12/22 0456  WBC 7.3 8.2 8.8 7.5 6.4  NEUTROABS  --  5.8  --   --   --  HGB 13.8 13.1 13.1 13.4 13.4  HCT 40.9 38.4 38.6 40.5 39.5  MCV 97.8 97.2 96.3 98.3 97.1  PLT 301 289 284 278 616   Basic Metabolic Panel: Recent Labs  Lab 01/06/22 0558 01/08/22 1719 01/09/22 0302 01/11/22 0630 01/12/22 0456  NA 137 138 138 138 139  K 3.4* 3.7 3.6 4.0 4.1  CL 101 100 102 103 103  CO2 26 26 22 26 27    GLUCOSE 130* 108* 117* 136* 188*  BUN 13 17 13 16 17   CREATININE 0.61 0.61 0.55 0.56 0.53  CALCIUM 9.8 9.4 9.7 9.9 9.5  MG  --   --   --  2.2 2.2   GFR: Estimated Creatinine Clearance: 69.9 mL/min (by C-G formula based on SCr of 0.53 mg/dL). Liver Function Tests: No results for input(s): "AST", "ALT", "ALKPHOS", "BILITOT", "PROT", "ALBUMIN" in the last 168 hours. No results for input(s): "LIPASE", "AMYLASE" in the last 168 hours. No results for input(s): "AMMONIA" in the last 168 hours. Coagulation Profile: Recent Labs  Lab 01/08/22 1719  INR 1.0   Cardiac Enzymes: No results for input(s): "CKTOTAL", "CKMB", "CKMBINDEX", "TROPONINI" in the last 168 hours. BNP (last 3 results) No results for input(s): "PROBNP" in the last 8760 hours. HbA1C: Recent Labs    01/10/22 0816  HGBA1C 5.6   CBG: Recent Labs  Lab 01/05/22 1201  GLUCAP 136*   Lipid Profile: Recent Labs    01/10/22 0816  CHOL 165  HDL 36*  LDLCALC 87  TRIG 210*  CHOLHDL 4.6   Thyroid Function Tests: No results for input(s): "TSH", "T4TOTAL", "FREET4", "T3FREE", "THYROIDAB" in the last 72 hours. Anemia Panel: Recent Labs    01/10/22 0915 01/11/22 0630  VITAMINB12 1,112*  --   FOLATE  --  21.3   Sepsis Labs: No results for input(s): "PROCALCITON", "LATICACIDVEN" in the last 168 hours.  No results found for this or any previous visit (from the past 240 hour(s)).        Radiology Studies: No results found.      Scheduled Meds:  dexamethasone  4 mg Oral Daily   docusate sodium  100 mg Oral BID   enoxaparin (LOVENOX) injection  80 mg Subcutaneous Q12H   ezetimibe  10 mg Oral Daily   feeding supplement (KATE FARMS STANDARD 1.4)  325 mL Oral BID BM   fentaNYL  1 patch Transdermal Q72H   hydrochlorothiazide  12.5 mg Oral Daily   pantoprazole  40 mg Oral Daily   Continuous Infusions:   LOS: 3 days   Time spent= 35 mins    [name] Arsenio Loader, MD [facility]  If 7PM-7AM,  please contact night-coverage  01/12/2022, 8:03 AM

## 2022-01-12 NOTE — Care Management Important Message (Signed)
Important Message  Patient Details IM Letter placed in Patients room. Name: Ailea Rhatigan Laabs MRN: [id number] Date of Birth: 02-19-1956   Medicare Important Message Given:  Yes     Maurissa, Ambrose 01/12/2022, 9:24 AM

## 2022-01-12 NOTE — Evaluation (Signed)
Occupational Therapy Evaluation Patient Details Name: [facility] MRN: [id number] DOB: Nov 06, 1955 Today's Date: 01/12/2022   History of Present Illness Pt admitted from home 2* sinus thrombus found on MRI.  Pt with recent dx of lung mass and with multiple metastatic bone lesions in pelvis and R proximal femur with possible spinal mets and L2 compression fx.   Clinical Impression   Patient is a 66 year old female who was admitted for above. Patient was noted to have increased pain impacting participation in ADLs. Patient's pain level in pelvis and L hip impacted participation in session with patient only comfortable in one position in bed. Patient was educated on importance of moving positions to preserve skin integrity. Patient verbalized understanding. Patient was noted to have decreased functional activity tolerance, decreased endurance, decreased standing and sitting balance, decreased safety awareness, and decreased knowledge of AD/AE impacting participation in ADLs. Patient would continue to benefit from skilled OT services at this time while admitted and after d/c to address noted deficits in order to improve overall safety and independence in ADLs.        Recommendations for follow up therapy are one component of a multi-disciplinary discharge planning process, led by the attending physician.  Recommendations may be updated based on patient status, additional functional criteria and insurance authorization.   Follow Up Recommendations  Home health OT    Assistance Recommended at Discharge Frequent or constant Supervision/Assistance  Patient can return home with the following A lot of help with bathing/dressing/bathroom;A little help with bathing/dressing/bathroom;Assistance with cooking/housework;Direct supervision/assist for financial management;Assist for transportation;Help with stairs or ramp for entrance;Direct supervision/assist for medications management    Functional  Status Assessment  Patient has had a recent decline in their functional status and demonstrates the ability to make significant improvements in function in a reasonable and predictable amount of time.  Equipment Recommendations  BSC/3in1    Recommendations for Other Services       Precautions / Restrictions        Mobility Bed Mobility Overal bed mobility: Needs Assistance Bed Mobility: Supine to Sit, Sit to Supine     Supine to sit: Min guard Sit to supine: Min assist   General bed mobility comments: physical assistance to get BLE back into bed.    Transfers                          Balance Overall balance assessment: Needs assistance Sitting-balance support: No upper extremity supported, Feet supported Sitting balance-Leahy Scale: Fair     Standing balance support: Bilateral upper extremity supported Standing balance-Leahy Scale: Poor Standing balance comment: Pt reliant on UEs on RW for support                           ADL either performed or assessed with clinical judgement   ADL Overall ADL's : Needs assistance/impaired Eating/Feeding: Set up;Sitting   Grooming: Wash/dry face;Oral care;Set up;Sitting Grooming Details (indicate cue type and reason): EOb with patient unable to sit up EOB for longer than 5 mins with increased pain in pelvic area. patient was noted to repeat stories aobut pain multiple times during session. Upper Body Bathing: Set up;Sitting   Lower Body Bathing: Moderate assistance;Bed level   Upper Body Dressing : Sitting;Minimal assistance   Lower Body Dressing: Bed level;Moderate assistance   Toilet Transfer: Minimal assistance;Rolling walker (2 wheels) Toilet Transfer Details (indicate cue type and reason): Patient was  able to stand with increased cues to use RW when patietn attemtpted to push it away to take small steps up [facility] with increased time and reports of pain. patient declined to attempt to sit up in recliner  on this date. Toileting- Clothing Manipulation and Hygiene: Maximal assistance;Bed level               Vision Patient Visual Report: No change from baseline       Perception     Praxis      Pertinent Vitals/Pain Pain Assessment Pain Assessment: 0-10 Pain Score: 8  Pain Location: L hip/thigh exacerbated with mobility and WB Pain Descriptors / Indicators: Grimacing, Guarding, Aching Pain Intervention(s): Limited activity within patient's tolerance, Monitored during session, Premedicated before session, Repositioned     Hand Dominance     Extremity/Trunk Assessment Upper Extremity Assessment Upper Extremity Assessment: Overall WFL for tasks assessed   Lower Extremity Assessment Lower Extremity Assessment: Defer to PT evaluation   Cervical / Trunk Assessment Cervical / Trunk Assessment: Normal   Communication Communication Communication: No difficulties   Cognition Arousal/Alertness: Awake/alert Behavior During Therapy: WFL for tasks assessed/performed Overall Cognitive Status: Within Functional Limits for tasks assessed                                       General Comments  patient was educated on skin integrity and importance of changing positions to reduce risks of skin breakdown. patient verbalized understanding.    Exercises     Shoulder Instructions      Home Living Family/patient expects to be discharged to:: Private residence Living Arrangements: Spouse/significant other;Children Available Help at Discharge: Family Type of Home: House Home Access: Stairs to enter Technical brewer of Steps: 3 Entrance Stairs-Rails: Right;Left Home Layout: One level     Bathroom Shower/Tub: Teacher, early years/pre: [place]: Conservation officer, nature (2 wheels);Cane - single point;Tub bench;Transport chair   Additional Comments: Pt states she has a lot of assistance from family and church family      Prior  Functioning/Environment Prior Level of Function : Needs assist             Mobility Comments: Pt reports using RW for limited ambulation and has been sleeping on sofa to use back and arms to facilitate mobility          OT Problem List: Decreased activity tolerance;Pain;Decreased knowledge of precautions;Decreased knowledge of use of DME or AE;Decreased safety awareness;Impaired balance (sitting and/or standing)      OT Treatment/Interventions: Self-care/ADL training;Therapeutic exercise;Neuromuscular education;Energy conservation;DME and/or AE instruction;Therapeutic activities;Balance training;Patient/family education    OT Goals(Current goals can be found in the care plan section) Acute Rehab OT Goals Patient Stated Goal: to get pain under control OT Goal Formulation: With patient Time For Goal Achievement: 01/26/22 Potential to Achieve Goals: Fair  OT Frequency: Min 2X/week    Co-evaluation              AM-PAC OT "6 Clicks" Daily Activity     Outcome Measure Help from another person eating meals?: None Help from another person taking care of personal grooming?: A Little Help from another person toileting, which includes using toliet, bedpan, or urinal?: A Lot Help from another person bathing (including washing, rinsing, drying)?: A Lot Help from another person to put on and taking off regular upper body clothing?: A Little Help from  another person to put on and taking off regular lower body clothing?: A Lot 6 Click Score: 16   End of Session Equipment Utilized During Treatment: Rolling walker (2 wheels) Nurse Communication: Mobility status  Activity Tolerance: Patient limited by pain Patient left: in bed;with call bell/phone within reach  OT Visit Diagnosis: Unsteadiness on feet (R26.81);Other abnormalities of gait and mobility (R26.89);Pain                Time: 0100-7121 OT Time Calculation (min): 27 min Charges:  OT General Charges $OT Visit: 1 Visit OT  Evaluation $OT Eval Moderate Complexity: 1 Mod OT Treatments $Self Care/Home Management : 8-22 mins  Jackelyn Poling OTR/L, MS Acute Rehabilitation Department Office# [phone] Pager# [phone]   Marcellina Millin 01/12/2022, 11:04 AM

## 2022-01-12 NOTE — Progress Notes (Signed)
[place] for lovenox Indication: Venous sinus thrombus  Allergies  Allergen Reactions   Codeine     Nausea and vomiting, hallucinations, bad dreams    Morphine Shortness Of Breath    B/P dropped!   Beef-Derived Products Nausea And Vomiting    Alpha Gal   Doxycycline Nausea And Vomiting   Ibuprofen     Stomach Ulcers, stomach scarred    Iodinated Contrast Media Nausea And Vomiting    Pt reports n/v only. No other reaction noted. No need for 13 hr prep.    Latex Rash   Nsaids Other (See Comments)    PT has hx of ulcers.   Penicillins Nausea And Vomiting    hives   Pork-Derived Products Nausea And Vomiting   Tramadol     Caused RLS   Gabapentin Other (See Comments)    Pt states this made her feel like she was always depressed and not caring about anything    Patient Measurements: Height: 5\' 3"  (160 cm) Weight: 79.4 kg (175 lb) IBW/kg (Calculated) : 52.4  Vital Signs: Temp: 97.5 F (36.4 C) (08/07 0532) Temp Source: Oral (08/07 0532) BP: 129/55 (08/07 0532) Pulse Rate: 81 (08/07 0532)  Labs: Recent Labs    01/11/22 0630 01/12/22 0456  HGB 13.4 13.4  HCT 40.5 39.5  PLT 278 287  CREATININE 0.56 0.53     Estimated Creatinine Clearance: 69.9 mL/min (by C-G formula based on SCr of 0.53 mg/dL).   Medical History: Past Medical History:  Diagnosis Date   Complication of anesthesia    "Hard to sedate", per pt and slow to wake up   COVID 10/2020   Degenerative joint disease    s/p cervical and lumbar fusions   Depression    pt denies   Encounter for preventive health examination    Next mammogram in 07/2009. Next colonoscopy in 09/2017. DEXA 4/09>Normal   Encounter for screening colonoscopy 10/03/2007   Normal   Fatty liver    Fibromyalgia    GERD (gastroesophageal reflux disease)    Hemorrhoid    hx of   History of hiatal hernia    Hyperlipidemia    Hypertension    Sciatica     Assessment: 8 YOF presenting  with HA, outpt MRI shows straight sinus thrombosis.  Not on anticoagulation PTA, CBC wnl  Goal of Therapy:  Anti-Xa level 0.6-1 units/ml 4hrs after LMWH dose given Monitor platelets by anticoagulation protocol: Yes   Plan:  Continue Lovenox 1mg /kg subq every 12 hours Monitor renal function, CBC, s/s bleeding F/u transition to oral anticoagulation   Thank you for allowing pharmacy to be a part of this patient's care.  Royetta Asal, PharmD, BCPS Clinical Pharmacist [place] Please utilize Amion for appropriate phone number to reach the unit pharmacist ([place]) 01/12/2022 7:18 AM

## 2022-01-13 ENCOUNTER — Other Ambulatory Visit: Payer: Self-pay

## 2022-01-13 ENCOUNTER — Ambulatory Visit
Admit: 2022-01-13 | Discharge: 2022-01-13 | Disposition: A | Discharge status: Home / Self Care | Payer: Medicare Other | Attending: Radiation Oncology | Admitting: Radiation Oncology

## 2022-01-13 ENCOUNTER — Ambulatory Visit: Payer: Medicare Other | Admitting: Acute Care

## 2022-01-13 DIAGNOSIS — I8290 Acute embolism and thrombosis of unspecified vein: Secondary | ICD-10-CM | POA: Diagnosis not present

## 2022-01-13 LAB — RAD ONC ARIA SESSION SUMMARY
Course Elapsed Days: 1
Plan Fractions Treated to Date: 2
Plan Fractions Treated to Date: 2
Plan Fractions Treated to Date: 2
Plan Prescribed Dose Per Fraction: 3 Gy
Plan Prescribed Dose Per Fraction: 3 Gy
Plan Prescribed Dose Per Fraction: 3 Gy
Plan Total Fractions Prescribed: 10
Plan Total Fractions Prescribed: 10
Plan Total Fractions Prescribed: 10
Plan Total Prescribed Dose: 30 Gy
Plan Total Prescribed Dose: 30 Gy
Plan Total Prescribed Dose: 30 Gy
Reference Point Dosage Given to Date: 6 Gy
Reference Point Dosage Given to Date: 6 Gy
Reference Point Dosage Given to Date: 6 Gy
Reference Point Session Dosage Given: 3 Gy
Reference Point Session Dosage Given: 3 Gy
Reference Point Session Dosage Given: 3 Gy
Session Number: 2

## 2022-01-13 LAB — BASIC METABOLIC PANEL
Anion gap: 11 (ref 5–15)
BUN: 15 mg/dL (ref 8–23)
CO2: 26 mmol/L (ref 22–32)
Calcium: 9.5 mg/dL (ref 8.9–10.3)
Chloride: 102 mmol/L (ref 98–111)
Creatinine, Ser: 0.5 mg/dL (ref 0.44–1.00)
GFR, Estimated: >60 mL/min (ref >60)
Glucose, Bld: 149 mg/dL — ABNORMAL HIGH (ref 70–99)
Potassium: 3.5 mmol/L (ref 3.5–5.1)
Sodium: 139 mmol/L (ref 135–145)

## 2022-01-13 LAB — CBC
HCT: 37.8 % (ref 36.0–46.0)
Hemoglobin: 12.6 g/dL (ref 12.0–15.0)
MCH: 32.7 pg (ref 26.0–34.0)
MCHC: 33.3 g/dL (ref 30.0–36.0)
MCV: 98.2 fL (ref 80.0–100.0)
Platelets: 307 10*3/uL (ref 150–400)
RBC: 3.85 MIL/uL — ABNORMAL LOW (ref 3.87–5.11)
RDW: 12.9 % (ref 11.5–15.5)
WBC: 9.8 10*3/uL (ref 4.0–10.5)
nRBC: 0 % (ref 0.0–0.2)

## 2022-01-13 LAB — MAGNESIUM: Magnesium: 2.2 mg/dL (ref 1.7–2.4)

## 2022-01-13 LAB — PROTEIN C, TOTAL: Protein C, Total: 148 % (ref 60–150)

## 2022-01-13 MED ORDER — OXYCODONE HCL 5 MG PO TABS
10.0000 mg | ORAL_TABLET | ORAL | Status: DC | PRN
  Administered 2022-01-13: 10 mg via ORAL
  Filled 2022-01-13: qty 2

## 2022-01-13 MED ORDER — CHLORHEXIDINE GLUCONATE CLOTH 2 % EX PADS
6.0000 | MEDICATED_PAD | Freq: Every day | CUTANEOUS | Status: DC
  Administered 2022-01-13 – 2022-01-28 (×16): 6 via TOPICAL

## 2022-01-13 MED ORDER — FENTANYL CITRATE PF 50 MCG/ML IJ SOSY
25.0000 ug | PREFILLED_SYRINGE | Freq: Four times a day (QID) | INTRAMUSCULAR | Status: DC | PRN
  Administered 2022-01-13 (×2): 25 ug via INTRAVENOUS
  Filled 2022-01-13 (×2): qty 1

## 2022-01-13 MED ORDER — POTASSIUM CHLORIDE CRYS ER 20 MEQ PO TBCR
20.0000 meq | EXTENDED_RELEASE_TABLET | ORAL | Status: AC
  Administered 2022-01-13 (×2): 20 meq via ORAL
  Filled 2022-01-13 (×2): qty 1

## 2022-01-13 MED ORDER — OXYCODONE HCL 5 MG PO TABS
15.0000 mg | ORAL_TABLET | ORAL | Status: DC | PRN
  Administered 2022-01-13 – 2022-01-14 (×5): 15 mg via ORAL
  Filled 2022-01-13 (×5): qty 3

## 2022-01-13 NOTE — Progress Notes (Signed)
OT Cancellation Note  Patient Details Name: [facility] MRN: [id number] DOB: [birth year]   Cancelled Treatment:    Reason Eval/Treat Not Completed: Other (comment) OT singing off at this time per PT as patient requested therapy stop at this time. Please reorder OT if patient and MD agree its time in future. Jackelyn Poling OTR/L, [place] Acute Rehabilitation Department Office# [phone] Pager# [phone]  01/13/2022, 12:22 PM

## 2022-01-13 NOTE — Progress Notes (Signed)
PROGRESS NOTE    [facility]  BPZ:[id number] DOB: Jun 10, 1955 DOA: 01/08/2022 PCP: Lorrene Reid, PA-C   Brief Narrative:  66 year old with history of  HTN, HLD, fibromyalgia and recent diagnosis of lung mass with metastatic disease and outpatient MRI brain done which showed sinus thrombosis therefore admitted to the hospital.  Patient was started on anticoagulation, neurology team was consulted.  Patient was transferred to [facility] from [facility] for radiation oncology evaluation who placed [facility] 8/4 with plans for 10 radiation treatments per Dr Tammi Klippel.  Discussed care with orthopedic who recommended protected weightbearing bilaterally, use walker all the time.  Consulted palliative for lytic lesions   Assessment & Plan:  Principal Problem:   VTE (venous thromboembolism) Active Problems:   Essential hypertension   Blood clots in brain    Straight sinus thrombosis -Seen by neurology team who has recommended anticoagulation.  Currently patient on Lovenox every 12 hours.  Discussed with Dr. Annamaria Boots and Dr. Earlie Server, okay for NOAC upon discharge  Metastatic lung mass with adenopathy Metastatic bone lesions, lytic right femur lesion - Underwent bronchoscopy with EBUS on 8/1 by pulmonary, biopsies performed.  Cytology consistent with adenocarcinoma -[facility] transferred [facility] at the request of radiation oncology.  SIM placed 8/4.  Dr. Tammi Klippel plans on 10 radiation treatments but can be done outpatient.  Spoke with Dr Lyla Glassing From Ortho- recommends protected weight bearing as tolerates b/l. Use walker all the time. PT/OT. Needs outpatient Ortho Oncology (no urgent inptn surgical indication for now). Appreciate his input. -Pain management.  On fentanyl patch (will increase today), increase oxycodone IR, reduce frequency of fentanyl IV.  Palliative care consulted for pain management.  Essential hypertension - On HCTZ.  IV as needed  ordered  Hyperlipidemia - Zetia  Fibromyalgia - Pain control  Acute urinary retention >500cc Persisting urinary retention, Foley catheter placed.  Flomax added  DVT prophylaxis: lovenox Code Status: Full Code  Family Communication: Family at bedside  Status is: Inpatient Remains inpatient appropriate because: Pain continues to be her big issue especially when she mobilizes.  Ongoing adjustments.  Subjective:  Patient still reports of left lower extremity pain and discomfort.  Does have significant limitations when participating in therapy due to pain.  Examination: Constitutional: Mild distress secondary to pain Respiratory: Clear to auscultation bilaterally Cardiovascular: Normal sinus rhythm, no rubs Abdomen: Nontender nondistended good bowel sounds Musculoskeletal: No edema noted Skin: No rashes seen Neurologic: CN 2-12 grossly intact.  And nonfocal Psychiatric: Normal judgment and insight. Alert and oriented x 3. Normal mood. Objective: Vitals:   01/12/22 0532 01/12/22 1309 01/12/22 2213 01/13/22 0654  BP: (!) 129/55 (!) 146/61 (!) 157/58 101/61  Pulse: 81 93 92 85  Resp: 14 18 17 17   Temp: (!) 97.5 F (36.4 C)  98.6 F (37 C) 98.6 F (37 C)  TempSrc: Oral  Oral Oral  SpO2: 91% 95% 97% 95%  Weight:      Height:        Intake/Output Summary (Last 24 hours) at 01/13/2022 0818 Last data filed at 01/12/2022 1700 Gross per 24 hour  Intake 480 ml  Output 1300 ml  Net -820 ml   Filed Weights   01/08/22 1923  Weight: 79.4 kg     Data Reviewed:   CBC: Recent Labs  Lab 01/08/22 1719 01/09/22 0302 01/11/22 0630 01/12/22 0456 01/13/22 0517  WBC 8.2 8.8 7.5 6.4 9.8  NEUTROABS 5.8  --   --   --   --  HGB 13.1 13.1 13.4 13.4 12.6  HCT 38.4 38.6 40.5 39.5 37.8  MCV 97.2 96.3 98.3 97.1 98.2  PLT 289 284 278 287 803   Basic Metabolic Panel: Recent Labs  Lab 01/08/22 1719 01/09/22 0302 01/11/22 0630 01/12/22 0456 01/13/22 0517  NA 138 138 138 139 139   K 3.7 3.6 4.0 4.1 3.5  CL 100 102 103 103 102  CO2 26 22 26 27 26   GLUCOSE 108* 117* 136* 188* 149*  BUN 17 13 16 17 15   CREATININE 0.61 0.55 0.56 0.53 0.50  CALCIUM 9.4 9.7 9.9 9.5 9.5  MG  --   --  2.2 2.2 2.2   GFR: Estimated Creatinine Clearance: 69.9 mL/min (by C-G formula based on SCr of 0.5 mg/dL). Liver Function Tests: No results for input(s): "AST", "ALT", "ALKPHOS", "BILITOT", "PROT", "ALBUMIN" in the last 168 hours. No results for input(s): "LIPASE", "AMYLASE" in the last 168 hours. No results for input(s): "AMMONIA" in the last 168 hours. Coagulation Profile: Recent Labs  Lab 01/08/22 1719  INR 1.0   Cardiac Enzymes: No results for input(s): "CKTOTAL", "CKMB", "CKMBINDEX", "TROPONINI" in the last 168 hours. BNP (last 3 results) No results for input(s): "PROBNP" in the last 8760 hours. HbA1C: No results for input(s): "HGBA1C" in the last 72 hours.  CBG: No results for input(s): "GLUCAP" in the last 168 hours.  Lipid Profile: No results for input(s): "CHOL", "HDL", "LDLCALC", "TRIG", "CHOLHDL", "LDLDIRECT" in the last 72 hours.  Thyroid Function Tests: No results for input(s): "TSH", "T4TOTAL", "FREET4", "T3FREE", "THYROIDAB" in the last 72 hours. Anemia Panel: Recent Labs    01/10/22 0915 01/11/22 0630  VITAMINB12 1,112*  --   FOLATE  --  21.3   Sepsis Labs: No results for input(s): "PROCALCITON", "LATICACIDVEN" in the last 168 hours.  No results found for this or any previous visit (from the past 240 hour(s)).        Radiology Studies: No results found.      Scheduled Meds:  Chlorhexidine Gluconate Cloth  6 each Topical Daily   dexamethasone  4 mg Oral Daily   docusate sodium  100 mg Oral BID   enoxaparin (LOVENOX) injection  80 mg Subcutaneous Q12H   ezetimibe  10 mg Oral Daily   feeding supplement (KATE FARMS STANDARD 1.4)  325 mL Oral BID BM   fentaNYL  1 patch Transdermal Q72H   hydrochlorothiazide  12.5 mg Oral Daily    pantoprazole  40 mg Oral Daily   tamsulosin  0.4 mg Oral QPC breakfast   Continuous Infusions:   LOS: 4 days   Time spent= 35 mins    Rendy Lazard Arsenio Loader, MD [facility]  If 7PM-7AM, please contact night-coverage  01/13/2022, 8:18 AM

## 2022-01-13 NOTE — Progress Notes (Signed)
PT Cancellation Note  Patient Details Name: Misty Decker MRN: [id number] DOB: 1955/10/20   Cancelled Treatment:    Reason Eval/Treat Not Completed: Patient declined, no reason specified;Other (comment) Extensive conversation with pt on benefits of mobility and therapeutic interventions/exercises and pt's desire to participate or not in therapy. Pt remains greatly limited by pain at this time and does not want to participate in therapy due to severity of pain exacerbation from movement. Pt conversely expressing strong motivation to mobilize and remain strong and active referring back to prior hospitalizations and experiences with rehab services. This therapist discussed the impact bed rest/low mobility level will have on muscle strength and activity tolerance and expressed understanding of pain limitations. Pt wishes to discuss stability of her fractures in her LE's due to severity of pain before continuing any PT/OT. Pt's family has placed on sign on her door "No Therapist Allowed". Will sign off for PT and alert OT to plan to discontinue OT as well until pt has discussed with MD and re-ordered therapy.     01/13/22 1100  PT Time Calculation  PT Start Time (ACUTE ONLY) 1028  PT Stop Time (ACUTE ONLY) 1046  PT Time Calculation (min) (ACUTE ONLY) 18 min  PT General Charges  $$ ACUTE PT VISIT 1 Visit (non-billable)     Verner Mould, DPT Acute Rehabilitation Services Office [phone] Pager [phone]  01/13/22 11:09 AM

## 2022-01-14 ENCOUNTER — Other Ambulatory Visit (HOSPITAL_COMMUNITY): Payer: Self-pay

## 2022-01-14 ENCOUNTER — Inpatient Hospital Stay: Payer: Medicare Other | Attending: Internal Medicine | Admitting: Internal Medicine

## 2022-01-14 ENCOUNTER — Other Ambulatory Visit: Payer: Self-pay

## 2022-01-14 ENCOUNTER — Telehealth (HOSPITAL_COMMUNITY): Payer: Self-pay | Admitting: Pharmacy Technician

## 2022-01-14 ENCOUNTER — Inpatient Hospital Stay: Payer: Medicare Other

## 2022-01-14 ENCOUNTER — Ambulatory Visit
Admit: 2022-01-14 | Discharge: 2022-01-14 | Disposition: A | Discharge status: Home / Self Care | Payer: Medicare Other | Attending: Radiation Oncology | Admitting: Radiation Oncology

## 2022-01-14 DIAGNOSIS — C7951 Secondary malignant neoplasm of bone: Secondary | ICD-10-CM | POA: Diagnosis not present

## 2022-01-14 DIAGNOSIS — Z515 Encounter for palliative care: Secondary | ICD-10-CM

## 2022-01-14 DIAGNOSIS — C3490 Malignant neoplasm of unspecified part of unspecified bronchus or lung: Secondary | ICD-10-CM

## 2022-01-14 DIAGNOSIS — G893 Neoplasm related pain (acute) (chronic): Secondary | ICD-10-CM

## 2022-01-14 DIAGNOSIS — I8290 Acute embolism and thrombosis of unspecified vein: Secondary | ICD-10-CM | POA: Diagnosis not present

## 2022-01-14 LAB — PROTHROMBIN GENE MUTATION

## 2022-01-14 LAB — RAD ONC ARIA SESSION SUMMARY
Course Elapsed Days: 2
Plan Fractions Treated to Date: 3
Plan Fractions Treated to Date: 3
Plan Fractions Treated to Date: 3
Plan Prescribed Dose Per Fraction: 3 Gy
Plan Prescribed Dose Per Fraction: 3 Gy
Plan Prescribed Dose Per Fraction: 3 Gy
Plan Total Fractions Prescribed: 10
Plan Total Fractions Prescribed: 10
Plan Total Fractions Prescribed: 10
Plan Total Prescribed Dose: 30 Gy
Plan Total Prescribed Dose: 30 Gy
Plan Total Prescribed Dose: 30 Gy
Reference Point Dosage Given to Date: 9 Gy
Reference Point Dosage Given to Date: 9 Gy
Reference Point Dosage Given to Date: 9 Gy
Reference Point Session Dosage Given: 3 Gy
Reference Point Session Dosage Given: 3 Gy
Reference Point Session Dosage Given: 3 Gy
Session Number: 3

## 2022-01-14 LAB — CBC
HCT: 38.7 % (ref 36.0–46.0)
Hemoglobin: 13 g/dL (ref 12.0–15.0)
MCH: 32.8 pg (ref 26.0–34.0)
MCHC: 33.6 g/dL (ref 30.0–36.0)
MCV: 97.7 fL (ref 80.0–100.0)
Platelets: 287 10*3/uL (ref 150–400)
RBC: 3.96 MIL/uL (ref 3.87–5.11)
RDW: 12.8 % (ref 11.5–15.5)
WBC: 8.8 10*3/uL (ref 4.0–10.5)
nRBC: 0 % (ref 0.0–0.2)

## 2022-01-14 LAB — BASIC METABOLIC PANEL
Anion gap: 9 (ref 5–15)
BUN: 20 mg/dL (ref 8–23)
CO2: 26 mmol/L (ref 22–32)
Calcium: 9.2 mg/dL (ref 8.9–10.3)
Chloride: 103 mmol/L (ref 98–111)
Creatinine, Ser: 0.55 mg/dL (ref 0.44–1.00)
GFR, Estimated: >60 mL/min (ref >60)
Glucose, Bld: 107 mg/dL — ABNORMAL HIGH (ref 70–99)
Potassium: 4 mmol/L (ref 3.5–5.1)
Sodium: 138 mmol/L (ref 135–145)

## 2022-01-14 LAB — MAGNESIUM: Magnesium: 2.1 mg/dL (ref 1.7–2.4)

## 2022-01-14 MED ORDER — BISACODYL 10 MG RE SUPP
10.0000 mg | Freq: Every day | RECTAL | Status: DC | PRN
  Filled 2022-01-14: qty 1

## 2022-01-14 MED ORDER — PREGABALIN 75 MG PO CAPS
75.0000 mg | ORAL_CAPSULE | Freq: Every day | ORAL | Status: DC
  Administered 2022-01-14 – 2022-01-22 (×9): 75 mg via ORAL
  Filled 2022-01-14 (×10): qty 1

## 2022-01-14 MED ORDER — SENNOSIDES-DOCUSATE SODIUM 8.6-50 MG PO TABS
2.0000 | ORAL_TABLET | Freq: Two times a day (BID) | ORAL | Status: DC
  Administered 2022-01-14 – 2022-01-28 (×26): 2 via ORAL
  Filled 2022-01-14 (×29): qty 2

## 2022-01-14 MED ORDER — METHOCARBAMOL 500 MG PO TABS
500.0000 mg | ORAL_TABLET | Freq: Three times a day (TID) | ORAL | Status: DC | PRN
  Administered 2022-01-14 (×2): 500 mg via ORAL
  Filled 2022-01-14 (×2): qty 1

## 2022-01-14 MED ORDER — FENTANYL CITRATE PF 50 MCG/ML IJ SOSY
25.0000 ug | PREFILLED_SYRINGE | INTRAMUSCULAR | Status: DC | PRN
  Administered 2022-01-14 – 2022-01-15 (×4): 25 ug via INTRAVENOUS
  Filled 2022-01-14 (×5): qty 1

## 2022-01-14 MED ORDER — OXYCODONE HCL 5 MG PO TABS
20.0000 mg | ORAL_TABLET | ORAL | Status: DC | PRN
  Administered 2022-01-14 – 2022-01-22 (×35): 20 mg via ORAL
  Filled 2022-01-14 (×35): qty 4

## 2022-01-14 NOTE — Telephone Encounter (Signed)
Pharmacy Patient Advocate Encounter  Insurance verification completed.    The patient is insured through [employer] Medicare Part D   The patient is currently admitted and ran test claims for the following: Eliquis, Xarelto.  Copays and coinsurance results were relayed to Inpatient clinical team.      

## 2022-01-14 NOTE — TOC Benefit Eligibility Note (Signed)
Patient Teacher, English as a foreign language completed.    The patient is currently admitted and upon discharge could be taking Eliquis 5 mg.  The current 30 day co-pay is $0.00.   The patient is currently admitted and upon discharge could be taking Xarelto 20 mg.  The current 30 day co-pay is $0.00.   The patient is insured through [place] [place] Patient Advocate Specialist [place] Patient Advocate Team Direct Number: [phone]  Fax: [phone]

## 2022-01-14 NOTE — Progress Notes (Signed)
PROGRESS NOTE  Misty Decker  DSK:[id number] DOB: 08/31/1955 DOA: 01/08/2022 PCP: Lorrene Reid, PA-C   Brief Narrative:  Patient is a 66 year old female with history of hypertension, hyperlipidemia, fibromyalgia, recent diagnosis of lung mass with metastatic disease who was admitted for the management of sinus thrombosis as per outpatient MRI.  Patient was started on anticoagulation, neurology team also consulted.  Patient was transferred to [facility] from [facility] for addition oncology evaluation.  Radiation treatment started.  Patient was also found to have metastatic bone lesions, lytic right femur lesion for which Ortho was consulted who recommended weightbearing as tolerated.  Hospital course remarkable for persistent pain, palliative care consulted for pain management.   Assessment & Plan:  Principal Problem:   VTE (venous thromboembolism) Active Problems:   Essential hypertension   Blood clots in brain  Stage sinus vein thrombosis: Found as per MRI done as an outpatient.  This is secondary to malignancy induced hypercoagulability.  Neurology was also consulted on admission.  Started on Lovenox, transition to [place] on dc  Osteolytic bone lesions: Metastatic bone lesions.  Transferred to [facility] for radiation oncology treatment.  Started on radiation treatment. Found to have multiple metastatic lesions on the pelvis, proximal femur.  Orthopedics recommended weightbearing as tolerated with walker at all times, possible outpatient referral to Ortho oncology at tertiary care center.  Metastatic lung cancer: Recently underwent bronchoscopy with EBUS on 8/1 by pulmonology, biopsies performed.  Cytology consistent with adenocarcinoma.  Ongoing radiation treatment.  Continue pain management, supportive care.  Oncology follow-up as an outpatient. Palliative care also consulted for goals of care.  Remains full code  Hypertension: On hydrochlorothiazide  Hyperlipidemia: On  Zetia  Fibromyalgia: Continue pain management, supportive care  Urinary retention: Foley catheter was placed, added Flomax  Debility/deconditioning: PT/OT recommended home health on discharge.  Nutrition Problem: Increased nutrient needs Etiology: cancer and cancer related treatments    DVT prophylaxis:     Code Status: Full Code  Family Communication: Family at bedside  Patient status:Inpatient  Patient is from :Home  Anticipated discharge IO:MBTD  Estimated DC date:1-2 days   Consultants: Palliative care, radiation oncology  Procedures:None  Antimicrobials:  Anti-infectives (From admission, onward)    None       Subjective: Patient seen and examined at the bedside this morning.  Lying on the bed, looks comfortable.  Family at bedside.  Not in any kind of distress.  Left patient is concerned about the persistent pain on her back and radiated to her left thigh and does not feel ready to go home soon  Objective: Vitals:   01/13/22 0654 01/13/22 2010 01/14/22 0529 01/14/22 1043  BP: 101/61 127/61 131/66 134/65  Pulse: 85 83 77 86  Resp: 17 18 18 18   Temp: 98.6 F (37 C) 98 F (36.7 C) 97.8 F (36.6 C) 98.2 F (36.8 C)  TempSrc: Oral Oral Oral Oral  SpO2: 95% 95% 96% 95%  Weight:      Height:        Intake/Output Summary (Last 24 hours) at 01/14/2022 1142 Last data filed at 01/14/2022 0930 Gross per 24 hour  Intake 480 ml  Output 1150 ml  Net -670 ml   Filed Weights   01/08/22 1923  Weight: 79.4 kg    Examination:  General exam: Overall comfortable, not in distress HEENT: PERRL Respiratory system:  no wheezes or crackles  Cardiovascular system: S1 & S2 heard, RRR.  Gastrointestinal system: Abdomen is nondistended, soft and nontender. Central nervous  system: Alert and oriented Extremities: No edema, no clubbing ,no cyanosis Skin: No rashes, no ulcers,no icterus   GU: Foley   Data Reviewed: I have personally reviewed following labs and  imaging studies  CBC: Recent Labs  Lab 01/08/22 1719 01/09/22 0302 01/11/22 0630 01/12/22 0456 01/13/22 0517 01/14/22 0543  WBC 8.2 8.8 7.5 6.4 9.8 8.8  NEUTROABS 5.8  --   --   --   --   --   HGB 13.1 13.1 13.4 13.4 12.6 13.0  HCT 38.4 38.6 40.5 39.5 37.8 38.7  MCV 97.2 96.3 98.3 97.1 98.2 97.7  PLT 289 284 278 287 307 401   Basic Metabolic Panel: Recent Labs  Lab 01/09/22 0302 01/11/22 0630 01/12/22 0456 01/13/22 0517 01/14/22 0543  NA 138 138 139 139 138  K 3.6 4.0 4.1 3.5 4.0  CL 102 103 103 102 103  CO2 22 26 27 26 26   GLUCOSE 117* 136* 188* 149* 107*  BUN 13 16 17 15 20   CREATININE 0.55 0.56 0.53 0.50 0.55  CALCIUM 9.7 9.9 9.5 9.5 9.2  MG  --  2.2 2.2 2.2 2.1     No results found for this or any previous visit (from the past 240 hour(s)).   Radiology Studies: No results found.  Scheduled Meds:  Chlorhexidine Gluconate Cloth  6 each Topical Daily   dexamethasone  4 mg Oral Daily   docusate sodium  100 mg Oral BID   enoxaparin (LOVENOX) injection  80 mg Subcutaneous Q12H   ezetimibe  10 mg Oral Daily   feeding supplement (KATE FARMS STANDARD 1.4)  325 mL Oral BID BM   fentaNYL  1 patch Transdermal Q72H   hydrochlorothiazide  12.5 mg Oral Daily   pantoprazole  40 mg Oral Daily   tamsulosin  0.4 mg Oral QPC breakfast   Continuous Infusions:   LOS: 5 days   Shelly Coss, MD [facility] P8/02/2022, 11:42 AM

## 2022-01-14 NOTE — Consult Note (Signed)
Consultation Note Date: 01/14/2022   Patient Name: Misty Decker  DOB: Feb 03, 1956  MRN: [id number]  Age / Sex: 66 y.o., female  PCP: Lorrene Reid, PA-C Referring Physician: Shelly Coss, MD  Reason for Consultation: Pain control  HPI/Patient Profile: 66 y.o. female  with past medical history of recent diagnosis of lung cancer with concern for spinal mets, sinus thrombus, HTN, HLD, fatty liver, fibromyalgia, depression admitted on 01/08/2022 with newly discovered sinus thrombus with increased pain due to new lung cancer with bone mets. She has began radiation therapy.   Clinical Assessment and Goals of Care: I met today at Misty Decker's bedside with her sister, Misty Decker, and her husband present. Misty Decker is awake and alert and conversant. I have reviewed records and noted underlying chronic pain with new diagnosis of lung cancer and even more recent discovery of bone mets. Misty Decker has good knowledge of her underlying disease and the severity. She is very upfront about her goals to get her pain better controlled so she is able to treat and "fight" this cancer. In her description of her pain she talks of mostly burning and shooting pain down her left extremity and even notes muscle spasms at times. She is hopeful that she can have some relief ultimately from radiation therapy. She is hopeful to pursue chemotherapy. She talks of having a high pain tolerance with chronic back pain that has been worse since ~February this year. She denies that her pain is much better than when she came into the hospital and cannot tell a difference in pain since starting fentanyl patch. She reports that the oxycodone helps some but does not last long enough. I discussed with her a plan to better target neuropathic pain and she did not like how gabapentin made her feel in the past and this was not helpful for her. We did discuss this is a new and  different pain than before and she does agree to trying Lyrica. We discussed a plan to see how the oxycodone provides relief and if this helps we may consider changing from fentanyl patch long acting to long acting oxycodone if this is providing more relief. We also discussed the importance of bowel regimen with LBM 8/6. We also discussed the importance of good nutrition and supplements as indicated and recommended. Discussed changes to be made today as well as plans for follow up tomorrow to discuss efficacy and tolerance to continue adjustments as indicated. Family and patient deny any concerns with any lethargy or confusion and report that she is at her baseline.   All questions/concerns addressed. Emotional support provided.   Primary Decision Maker PATIENT    SUMMARY OF RECOMMENDATIONS   - Priority is improved pain management so she can pursue cancer treatment  Code Status/Advance Care Planning: Full code   Symptom Management:  Pain 7/10 sciatica/burning pain down left leg:  Increase OxyIR 20 mg every 3 hours PRN. Adding Lyrica 75 mg daily.  Robaxin PRN muscle spasms. May consider scheduled doses if helpful.  Continue fentanyl patch 50  mcg/hr for now. She may be having increased relief from oxycodone over fentanyl. May consider transition from fentanyl patch to long acting oxycodone tomorrow.  Continue heating pad.  Continue radiation therapy and steroids.  Could consider pamidronate if continued poor relief after above interventions optimized.  Bowel Regimen: Senokot-S 2 tabs BID. Dulcolax suppository daily PRN.   Prognosis:  Overall prognosis concerning with new diagnosis of advanced cancer.   Discharge Planning: To Be Determined      Primary Diagnoses: Present on Admission:  VTE (venous thromboembolism)  Essential hypertension  Blood clots in brain   I have reviewed the medical record, interviewed the patient and family, and examined the patient. The following aspects  are pertinent.  Past Medical History:  Diagnosis Date   Complication of anesthesia    "Hard to sedate", per pt and slow to wake up   COVID 10/2020   Degenerative joint disease    s/p cervical and lumbar fusions   Depression    pt denies   Encounter for preventive health examination    Next mammogram in 07/2009. Next colonoscopy in 09/2017. DEXA 4/09>Normal   Encounter for screening colonoscopy 10/03/2007   Normal   Fatty liver    Fibromyalgia    GERD (gastroesophageal reflux disease)    Hemorrhoid    hx of   History of hiatal hernia    Hyperlipidemia    Hypertension    Sciatica    Social History   Socioeconomic History   Marital status: Married    Spouse name: Not on file   Number of children: Not on file   Years of education: Not on file   Highest education level: Not on file  Occupational History   Not on file  Tobacco Use   Smoking status: Former    Packs/day: 2.00    Years: 30.00    Total pack years: 60.00    Types: Cigarettes    Quit date: 04/03/2003    Years since quitting: 18.7   Smokeless tobacco: Never  Vaping Use   Vaping Use: Never used  Substance and Sexual Activity   Alcohol use: Not Currently    Alcohol/week: 0.0 standard drinks of alcohol   Drug use: No   Sexual activity: Not Currently  Other Topics Concern   Not on file  Social History Narrative   Not on file   Social Determinants of Health   Financial Resource Strain: Medium Risk (03/02/2021)   Overall Financial Resource Strain (CARDIA)    Difficulty of Paying Living Expenses: Somewhat hard  Food Insecurity: No Food Insecurity (03/02/2021)   Hunger Vital Sign    Worried About Running Out of Food in the Last Year: Never true    Ran Out of Food in the Last Year: Never true  Transportation Needs: No Transportation Needs (03/02/2021)   PRAPARE - Hydrologist (Medical): No    Lack of Transportation (Non-Medical): No  Physical Activity: Inactive (03/02/2021)    Exercise Vital Sign    Days of Exercise per Week: 0 days    Minutes of Exercise per Session: 0 min  Stress: Stress Concern Present (03/02/2021)   [place]    Feeling of Stress : To some extent  Social Connections: Moderately Integrated (03/02/2021)   Social Connection and Isolation Panel [NHANES]    Frequency of Communication with Friends and Family: More than three times a week    Frequency of Social Gatherings with Friends  and Family: More than three times a week    Attends Religious Services: More than 4 times per year    Active Member of Clubs or Organizations: No    Attends Archivist Meetings: Never    Marital Status: Married   Family History  Problem Relation Age of Onset   Cancer Mother    Diabetes Mother    Breast cancer Mother    Heart attack Mother    Hyperlipidemia Mother    Hyperlipidemia Father    Hypertension Father    Aneurysm Father    Dementia Maternal Grandfather    Cancer Paternal Grandmother        intestinal   Scheduled Meds:  Chlorhexidine Gluconate Cloth  6 each Topical Daily   dexamethasone  4 mg Oral Daily   docusate sodium  100 mg Oral BID   enoxaparin (LOVENOX) injection  80 mg Subcutaneous Q12H   ezetimibe  10 mg Oral Daily   feeding supplement (KATE FARMS STANDARD 1.4)  325 mL Oral BID BM   fentaNYL  1 patch Transdermal Q72H   hydrochlorothiazide  12.5 mg Oral Daily   pantoprazole  40 mg Oral Daily   tamsulosin  0.4 mg Oral QPC breakfast   Continuous Infusions: PRN Meds:.acetaminophen, alum & mag hydroxide-simeth, fentaNYL (SUBLIMAZE) injection, guaiFENesin, hydrALAZINE, ipratropium-albuterol, metoprolol tartrate, ondansetron, mouth rinse, oxyCODONE, senna-docusate, traZODone Allergies  Allergen Reactions   Codeine     Nausea and vomiting, hallucinations, bad dreams    Morphine Shortness Of Breath    B/P dropped!   Beef-Derived Products Nausea And Vomiting     Alpha Gal   Doxycycline Nausea And Vomiting   Ibuprofen     Stomach Ulcers, stomach scarred    Iodinated Contrast Media Nausea And Vomiting    Pt reports n/v only. No other reaction noted. No need for 13 hr prep.    Latex Rash   Nsaids Other (See Comments)    PT has hx of ulcers.   Penicillins Nausea And Vomiting    hives   Pork-Derived Products Nausea And Vomiting   Tramadol     Caused RLS   Gabapentin Other (See Comments)    Pt states this made her feel like she was always depressed and not caring about anything   Review of Systems  Constitutional:  Positive for activity change and appetite change. Negative for fatigue.  Respiratory:  Negative for cough and shortness of breath.   Musculoskeletal:  Positive for back pain.    Physical Exam Vitals and nursing note reviewed.  Constitutional:      General: She is not in acute distress.    Appearance: She is ill-appearing.  Cardiovascular:     Rate and Rhythm: Normal rate.  Pulmonary:     Effort: No tachypnea, accessory muscle usage or respiratory distress.  Abdominal:     General: Abdomen is flat.     Palpations: Abdomen is soft.  Neurological:     Mental Status: She is alert and oriented to person, place, and time.     Vital Decker: BP 131/66 (BP Location: Left Arm)   Pulse 77   Temp 97.8 F (36.6 C) (Oral)   Resp 18   Ht $R'5\' 3"'Sh$  (1.6 m)   Wt 79.4 kg   SpO2 96%   BMI 31.00 kg/m  Pain Scale: 0-10 POSS *See Group Information*: 1-Acceptable,Awake and alert Pain Score: Asleep   SpO2: SpO2: 96 % O2 Device:SpO2: 96 % O2 Flow Rate: .   IO: Intake/output  summary:  Intake/Output Summary (Last 24 hours) at 01/14/2022 1043 Last data filed at 01/14/2022 0930 Gross per 24 hour  Intake 480 ml  Output 1150 ml  Net -670 ml    LBM: Last BM Date : 01/11/22 Baseline Weight: Weight: 79.4 kg Most recent weight: Weight: 79.4 kg     Palliative Assessment/Data:     Time In: 1100  Time Total: 80 min  Greater than 50%  of  this time was spent counseling and coordinating care related to the above assessment and plan.  Signed by: Vinie Sill, NP Palliative Medicine Team Pager # [phone] (M-F 8a-5p) Team Phone # [phone] (Nights/Weekends)

## 2022-01-14 NOTE — TOC Initial Note (Signed)
Transition of Care [facility]) - Initial/Assessment Note    Patient Details  Name: Misty Decker MRN: [id number] Date of Birth: 03-22-1956  [facility] CM/SW Contact:    Lennart Pall, LCSW Phone Number: 01/14/2022, 3:23 PM  Clinical Narrative:                 Met with pt today to introduce TOC/CSW role with dc planning needs.  Pt confirms that she lives with spouse but does note that he has some health concerns himself.  They have good support from their two daughters and pt's sister.  Pt very focused on her primary goal of pain control.  Attempted to discuss possible DME and f/u referral needs for dc, however, pt does not feel "ready" to discuss because she isn't ready to for dc yet.  TOC will follow along and readdress with pt when medical team feels approaching dc readiness.  Expected Discharge Plan: [place] Barriers to Discharge: Continued Medical Work up   Patient Goals and CMS Choice Patient states their goals for this hospitalization and ongoing recovery are:: pain controlled and return home      Expected Discharge Plan and Services Expected Discharge Plan: [place] In-house Referral: Clinical Social Work     Living arrangements for the past 2 months: Single Family Home                                      Prior Living Arrangements/Services Living arrangements for the past 2 months: Single Family Home Lives with:: Spouse Patient language and need for interpreter reviewed:: Yes Do you feel safe going back to the place where you live?: Yes      Need for Family Participation in Patient Care: Yes (Comment) Care giver support system in place?: Yes (comment)   Criminal Activity/Legal Involvement Pertinent to Current Situation/Hospitalization: No - Comment as needed  Activities of Daily Living Home Assistive Devices/Equipment: Wheelchair ADL Screening (condition at time of admission) Patient's cognitive ability  adequate to safely complete daily activities?: Yes Is the patient deaf or have difficulty hearing?: No Does the patient have difficulty seeing, even when wearing glasses/contacts?: Yes Does the patient have difficulty concentrating, remembering, or making decisions?: No Patient able to express need for assistance with ADLs?: Yes Does the patient have difficulty dressing or bathing?: No Independently performs ADLs?: Yes (appropriate for developmental age) Does the patient have difficulty walking or climbing stairs?: Yes Weakness of Legs: Both Weakness of Arms/Hands: Both  Permission Sought/Granted Permission sought to share information with : Family Supports Permission granted to share information with : Yes, Verbal Permission Granted  Share Information with NAME: Estill Bamberg May     Permission granted to share info w Relationship: daughter  Permission granted to share info w Contact Information: [phone]  Emotional Assessment Appearance:: Appears stated age Attitude/Demeanor/Rapport: Engaged Affect (typically observed): Accepting, Hopeful Orientation: : Oriented to Self, Oriented to Place, Oriented to  Time Alcohol / Substance Use: Not Applicable Psych Involvement: No (comment)  Admission diagnosis:  Cerebral venous thrombosis [G08] VTE (venous thromboembolism) [I82.90] Blood clots in brain [I66.9] Patient Active Problem List   Diagnosis Date Noted   Blood clots in brain 01/09/2022   Malignant neoplasm metastatic to bone ([place]) 01/09/2022   VTE (venous thromboembolism) 01/08/2022   Mass of left lung 12/30/2021   Vitamin D deficiency 01/25/2019   Chronic bilateral low  back pain without sciatica 01/25/2019   Insomnia 09/12/2018   Caregiver stress 03/28/2018   Spondylolisthesis, lumbar region 12/15/2017   Elevated blood sugar level 11/12/2017   Papanicolaou smear for cervical cancer screening 11/12/2017   Family history of breast cancer in mother 11/12/2017   Gastroesophageal  reflux disease 09/27/2017   Obesity, Class I, BMI 30-34.9 09/27/2017   Stopped smoking with greater than 30 pack year history 02/26/2017   Family history of diabetes mellitus (DM)- Mom and sister 10/26/2016   Gastric erosions--- NSAID-related erosions 10/26/2016   Pre-diabetes 10/26/2016   Obesity (BMI 30.0-34.9) 10/26/2016   Statin intolerance- elevated LFT's and severe myalgias 10/26/2016   Elevated hemoglobin ([place]) 01/17/2015   Health care maintenance 90/30/0923   Nonalcoholic fatty liver disease- elevated LFT's 02/27/2014   Gastroesophageal reflux disease without esophagitis 05/08/2013   Cervicogenic headache 04/25/2012   Previous back surgery 05/05/2011   Back pain 05/05/2011   Dysthymic disorder 10/23/2009   Fibromyalgia 05/16/2009   Mixed hyperlipidemia 09/23/2006   Essential hypertension 09/23/2006   DEGENERATIVE JOINT DISEASE 09/23/2006   HYSTERECTOMY, TOTAL, HX OF 09/23/2006   PCP:  Lorrene Reid, PA-C Pharmacy:   [facility] [place] [place] [address] Phone: [phone] Fax: [place] [address] [place] [address] Phone: [phone] Fax: [phone]     Social Determinants of Health (SDOH) Interventions    Readmission Risk Interventions     No data to display

## 2022-01-15 ENCOUNTER — Other Ambulatory Visit: Payer: Self-pay

## 2022-01-15 ENCOUNTER — Ambulatory Visit
Admit: 2022-01-15 | Discharge: 2022-01-15 | Disposition: A | Discharge status: Home / Self Care | Payer: Medicare Other | Attending: Radiation Oncology | Admitting: Radiation Oncology

## 2022-01-15 DIAGNOSIS — G893 Neoplasm related pain (acute) (chronic): Secondary | ICD-10-CM | POA: Diagnosis not present

## 2022-01-15 DIAGNOSIS — Z515 Encounter for palliative care: Secondary | ICD-10-CM | POA: Diagnosis not present

## 2022-01-15 DIAGNOSIS — I8290 Acute embolism and thrombosis of unspecified vein: Secondary | ICD-10-CM | POA: Diagnosis not present

## 2022-01-15 LAB — RAD ONC ARIA SESSION SUMMARY
Course Elapsed Days: 3
Plan Fractions Treated to Date: 4
Plan Fractions Treated to Date: 4
Plan Fractions Treated to Date: 4
Plan Prescribed Dose Per Fraction: 3 Gy
Plan Prescribed Dose Per Fraction: 3 Gy
Plan Prescribed Dose Per Fraction: 3 Gy
Plan Total Fractions Prescribed: 10
Plan Total Fractions Prescribed: 10
Plan Total Fractions Prescribed: 10
Plan Total Prescribed Dose: 30 Gy
Plan Total Prescribed Dose: 30 Gy
Plan Total Prescribed Dose: 30 Gy
Reference Point Dosage Given to Date: 12 Gy
Reference Point Dosage Given to Date: 12 Gy
Reference Point Dosage Given to Date: 12 Gy
Reference Point Session Dosage Given: 3 Gy
Reference Point Session Dosage Given: 3 Gy
Reference Point Session Dosage Given: 3 Gy
Session Number: 4

## 2022-01-15 LAB — BASIC METABOLIC PANEL
Anion gap: 8 (ref 5–15)
BUN: 25 mg/dL — ABNORMAL HIGH (ref 8–23)
CO2: 27 mmol/L (ref 22–32)
Calcium: 9.2 mg/dL (ref 8.9–10.3)
Chloride: 103 mmol/L (ref 98–111)
Creatinine, Ser: 0.47 mg/dL (ref 0.44–1.00)
GFR, Estimated: >60 mL/min (ref >60)
Glucose, Bld: 143 mg/dL — ABNORMAL HIGH (ref 70–99)
Potassium: 3.9 mmol/L (ref 3.5–5.1)
Sodium: 138 mmol/L (ref 135–145)

## 2022-01-15 LAB — CBC
HCT: 40.2 % (ref 36.0–46.0)
Hemoglobin: 13.3 g/dL (ref 12.0–15.0)
MCH: 32.5 pg (ref 26.0–34.0)
MCHC: 33.1 g/dL (ref 30.0–36.0)
MCV: 98.3 fL (ref 80.0–100.0)
Platelets: 303 10*3/uL (ref 150–400)
RBC: 4.09 MIL/uL (ref 3.87–5.11)
RDW: 12.8 % (ref 11.5–15.5)
WBC: 8.4 10*3/uL (ref 4.0–10.5)
nRBC: 0 % (ref 0.0–0.2)

## 2022-01-15 LAB — FACTOR 5 LEIDEN

## 2022-01-15 LAB — MAGNESIUM: Magnesium: 2.4 mg/dL (ref 1.7–2.4)

## 2022-01-15 MED ORDER — DULOXETINE HCL 20 MG PO CPEP
20.0000 mg | ORAL_CAPSULE | Freq: Every day | ORAL | Status: DC
  Administered 2022-01-15 – 2022-01-24 (×10): 20 mg via ORAL
  Filled 2022-01-15 (×12): qty 1

## 2022-01-15 MED ORDER — POLYETHYLENE GLYCOL 3350 17 G PO PACK
17.0000 g | PACK | Freq: Every day | ORAL | Status: DC
  Administered 2022-01-15 – 2022-01-23 (×9): 17 g via ORAL
  Filled 2022-01-15 (×8): qty 1

## 2022-01-15 MED ORDER — METHOCARBAMOL 500 MG PO TABS
500.0000 mg | ORAL_TABLET | Freq: Three times a day (TID) | ORAL | Status: DC
  Administered 2022-01-15 – 2022-01-21 (×18): 500 mg via ORAL
  Filled 2022-01-15 (×18): qty 1

## 2022-01-15 MED ORDER — SORBITOL 70 % SOLN
30.0000 mL | Freq: Once | Status: AC
Start: 2022-01-15 — End: 2022-01-15
  Administered 2022-01-15: 30 mL via ORAL
  Filled 2022-01-15: qty 30

## 2022-01-15 NOTE — Care Management Important Message (Signed)
Important Message  Patient Details IM Letter given to the Patient. Name: Misty Decker MRN: [id number] Date of Birth: 1955/09/07   Medicare Important Message Given:  Yes     [name], [name] 01/15/2022, 10:02 AM

## 2022-01-15 NOTE — Progress Notes (Signed)
ANTICOAGULATION CONSULT NOTE - Follow Up Consult  Pharmacy Consult for lovenox Indication: straight sinus thrombus  Allergies  Allergen Reactions   Codeine     Nausea and vomiting, hallucinations, bad dreams    Morphine Shortness Of Breath    B/P dropped!   Beef-Derived Products Nausea And Vomiting    Alpha Gal   Doxycycline Nausea And Vomiting   Ibuprofen     Stomach Ulcers, stomach scarred    Iodinated Contrast Media Nausea And Vomiting    Pt reports n/v only. No other reaction noted. No need for 13 hr prep.    Latex Rash   Nsaids Other (See Comments)    PT has hx of ulcers.   Penicillins Nausea And Vomiting    hives   Pork-Derived Products Nausea And Vomiting   Tramadol     Caused RLS   Gabapentin Other (See Comments)    Pt states this made her feel like she was always depressed and not caring about anything    Patient Measurements: Height: 5\' 3"  (160 cm) Weight: 79.4 kg (175 lb) IBW/kg (Calculated) : 52.4 Heparin Dosing Weight:   Vital Signs: Temp: 98 F (36.7 C) (08/10 0509) Temp Source: Oral (08/10 0509) BP: 149/64 (08/10 0509) Pulse Rate: 87 (08/10 0509)  Labs: Recent Labs    01/13/22 0517 01/14/22 0543 01/15/22 0538  HGB 12.6 13.0 13.3  HCT 37.8 38.7 40.2  PLT 307 287 303  CREATININE 0.50 0.55 0.47    Estimated Creatinine Clearance: 69.9 mL/min (by C-G formula based on SCr of 0.47 mg/dL).   Assessment: Patient is a 66 y.o F with newly diagnosed lung cancer with mets to bone who presented to the ED on 01/08/22 after an outpatient head MRI showed straight sinus thrombosis.  Lovenox started on admission for thrombus with plan to transition to [place] at discharge.  Today, 01/15/2022: - cbc ok - scr stable (crcl>30) - no bleeding documented  Goal of Therapy:  Anti-Xa level 0.6-1 units/ml 4hrs after LMWH dose given Monitor platelets by anticoagulation protocol: Yes   Plan:  - continue lovenox 80 mg SQ q12h - cbc q72h - monitor for s/sx  bleeding  Rj Pedrosa P 01/15/2022,10:08 AM

## 2022-01-15 NOTE — Progress Notes (Signed)
Palliative:  HPI: 66 y.o. female  with past medical history of recent diagnosis of lung cancer with concern for spinal mets, sinus thrombus, HTN, HLD, fatty liver, fibromyalgia, depression admitted on 01/08/2022 with newly discovered sinus thrombus with increased pain due to new lung cancer with bone mets. She has began radiation therapy.   I met again today with Misty Decker. No family at bedside. She is having severe pain at time of my visit but reports that her pain was much better managed yesterday and through the night she was able to rest. She reports great relief with Robaxin especially during radiation therapy. Still no bowel movement. She is tearful about her recurring pain this morning after having relief. I reassured her that if we were able to control it better yesterday we should be able to get back there again today and now we know better what is helpful for her. She expresses feelings of depression and agrees with antidepressant that may help with pain as well. We discussed plan moving forward as outlined below. She would like better pain control before working with therapy - she had a bad experience that left her with excruciating pain.   All questions/concerns addressed. Emotional support provided.   Symptom Management:  Pain 7/10 sciatica/burning pain down left leg from low back:  OxyIR 20 mg (consider increase) every 3 hours PRN. Lyrica 75 mg daily.  Added Cymbalta 20 mg daily for neuropathic pain as well as patient complaints of depression. Spiritual care consult as well.  Robaxin was noted to be helpful. Scheduled TID. May consider increased dosage if needed.  Continue fentanyl patch 50 mcg/hr for now. She may be having increased relief from oxycodone over fentanyl. May consider transition from fentanyl patch to long acting oxycodone tomorrow.  Continue heating pad.  Continue radiation therapy and steroids.  Could consider pamidronate if continued poor relief after above interventions  optimized.  Bowel Regimen: Continue Senokot-S 2 tabs BID. Sorbitol x1 today. Dulcolax suppository daily PRN to give this afternoon if no BM.  Referral to outpatient palliative care at [facility], NP recommended.    [place], NP Palliative Medicine Team Pager [phone] (Please see [email or web address] for schedule) Team Phone [phone]    Greater than 50%  of this time was spent counseling and coordinating care related to the above assessment and plan

## 2022-01-15 NOTE — Progress Notes (Addendum)
PROGRESS NOTE  Misty Decker  MVE:[id number] DOB: 04/04/1956 DOA: 01/08/2022 PCP: Lorrene Reid, PA-C   Brief Narrative:  Patient is a 66 year old female with history of hypertension, hyperlipidemia, fibromyalgia, recent diagnosis of lung mass with metastatic disease who was admitted for the management of sinus thrombosis as per outpatient MRI.  Patient was started on anticoagulation, neurology team also consulted.  Patient was transferred to [facility] from [facility] for addition oncology evaluation.  Radiation treatment started.  Patient was also found to have metastatic bone lesions, lytic right femur lesion for which Ortho was consulted who recommended weightbearing as tolerated.  Hospital course remarkable for persistent pain, palliative care consulted fand following for pain management.   Assessment & Plan:  Principal Problem:   VTE (venous thromboembolism) Active Problems:   Essential hypertension   Blood clots in brain  Stage sinus vein thrombosis: Found as per MRI done as an outpatient.  This is secondary to malignancy induced hypercoagulability.  Neurology was also consulted on admission.  Started on Lovenox, transition to [place] on dc  Osteolytic bone lesions: Metastatic bone lesions.  Transferred to [facility] for radiation oncology treatment.  Started on radiation treatment. Found to have multiple metastatic lesions on the pelvis, proximal femur.  Orthopedics recommended weightbearing as tolerated with walker at all times, possible outpatient referral to Ortho oncology at tertiary care center. Complains of severe pain on the left thigh, left leg, tingling, numbness.  On fentanyl patch, Lyrica, oxycodone  Metastatic lung cancer: Recently underwent bronchoscopy with EBUS on 8/1 by pulmonology, biopsies performed.  Cytology consistent with adenocarcinoma.  Ongoing radiation treatment.  Continue pain management, supportive care.  Oncology follow-up as an outpatient. Palliative care  also consulted for goals of care.  Remains full code  Hypertension: On hydrochlorothiazide  Hyperlipidemia: On Zetia.She does not have diabetes, no history of stroke, coronary artery disease or peripheral vascular disease.No indication to start on a statin to decrease LDL to less than 70  Fibromyalgia: Continue pain management, supportive care  Urinary retention: Foley catheter was placed, added Flomax  Debility/deconditioning: PT/OT recommended home health on discharge.  Nutrition Problem: Increased nutrient needs Etiology: cancer and cancer related treatments    DVT prophylaxis:lovenox     Code Status: Full Code  Family Communication: Family at bedside on 8/9  Patient status:Inpatient  Patient is from :Home  Anticipated discharge GE:ZMOQ  Estimated DC date:1-2 days.  Patient still complains of severe pain on the back and left lower extremity.  Discharge planning after adequate pain control   Consultants: Palliative care, radiation oncology  Procedures:None  Antimicrobials:  Anti-infectives (From admission, onward)    None       Subjective:  Patient seen and examined the bedside this morning.  Hemodynamically stable.  Perative care provider was at the bedside.  However his pain has not improved and she is still having significant discomfort on her low back and left thigh, left leg.  Does not feel ready to go home yet.  Objective: Vitals:   01/14/22 1043 01/14/22 1343 01/14/22 2111 01/15/22 0509  BP: 134/65 126/80 (!) 140/67 (!) 149/64  Pulse: 86 91 95 87  Resp: 18 16 18 18   Temp: 98.2 F (36.8 C) (!) 97.4 F (36.3 C) 99.1 F (37.3 C) 98 F (36.7 C)  TempSrc: Oral Oral Oral Oral  SpO2: 95% 95% 95% 97%  Weight:      Height:        Intake/Output Summary (Last 24 hours) at 01/15/2022 1145 Last data  filed at 01/15/2022 6948 Gross per 24 hour  Intake 720 ml  Output 2075 ml  Net -1355 ml   Filed Weights   01/08/22 1923  Weight: 79.4 kg     Examination:  Minimal blood in the General exam: Overall comfortable, not in distress, not in significant pain during my evaluation HEENT: PERRL Respiratory system:  no wheezes or crackles  Cardiovascular system: S1 & S2 heard, RRR.  Gastrointestinal system: Abdomen is nondistended, soft and nontender. Central nervous system: Alert and oriented Extremities: No edema, no clubbing ,no cyanosis Skin: No rashes, no ulcers,no icterus  GU: Foley   Data Reviewed: I have personally reviewed following labs and imaging studies  CBC: Recent Labs  Lab 01/08/22 1719 01/09/22 0302 01/11/22 0630 01/12/22 0456 01/13/22 0517 01/14/22 0543 01/15/22 0538  WBC 8.2   < > 7.5 6.4 9.8 8.8 8.4  NEUTROABS 5.8  --   --   --   --   --   --   HGB 13.1   < > 13.4 13.4 12.6 13.0 13.3  HCT 38.4   < > 40.5 39.5 37.8 38.7 40.2  MCV 97.2   < > 98.3 97.1 98.2 97.7 98.3  PLT 289   < > 278 287 307 287 303   < > = values in this interval not displayed.   Basic Metabolic Panel: Recent Labs  Lab 01/11/22 0630 01/12/22 0456 01/13/22 0517 01/14/22 0543 01/15/22 0538  NA 138 139 139 138 138  K 4.0 4.1 3.5 4.0 3.9  CL 103 103 102 103 103  CO2 26 27 26 26 27   GLUCOSE 136* 188* 149* 107* 143*  BUN 16 17 15 20  25*  CREATININE 0.56 0.53 0.50 0.55 0.47  CALCIUM 9.9 9.5 9.5 9.2 9.2  MG 2.2 2.2 2.2 2.1 2.4     No results found for this or any previous visit (from the past 240 hour(s)).   Radiology Studies: No results found.  Scheduled Meds:  Chlorhexidine Gluconate Cloth  6 each Topical Daily   dexamethasone  4 mg Oral Daily   DULoxetine  20 mg Oral Daily   enoxaparin (LOVENOX) injection  80 mg Subcutaneous Q12H   ezetimibe  10 mg Oral Daily   feeding supplement (KATE FARMS STANDARD 1.4)  325 mL Oral BID BM   fentaNYL  1 patch Transdermal Q72H   hydrochlorothiazide  12.5 mg Oral Daily   methocarbamol  500 mg Oral Q8H   pantoprazole  40 mg Oral Daily   pregabalin  75 mg Oral Daily    senna-docusate  2 tablet Oral BID   tamsulosin  0.4 mg Oral QPC breakfast   Continuous Infusions:   LOS: 6 days   Shelly Coss, MD [facility] P8/03/2022, 11:45 AM

## 2022-01-16 ENCOUNTER — Ambulatory Visit
Admit: 2022-01-16 | Discharge: 2022-01-16 | Disposition: A | Discharge status: Home / Self Care | Payer: Medicare Other | Attending: Radiation Oncology | Admitting: Radiation Oncology

## 2022-01-16 ENCOUNTER — Other Ambulatory Visit: Payer: Self-pay

## 2022-01-16 DIAGNOSIS — I8290 Acute embolism and thrombosis of unspecified vein: Secondary | ICD-10-CM | POA: Diagnosis not present

## 2022-01-16 LAB — BASIC METABOLIC PANEL
Anion gap: 7 (ref 5–15)
BUN: 20 mg/dL (ref 8–23)
CO2: 29 mmol/L (ref 22–32)
Calcium: 9.3 mg/dL (ref 8.9–10.3)
Chloride: 102 mmol/L (ref 98–111)
Creatinine, Ser: 0.52 mg/dL (ref 0.44–1.00)
GFR, Estimated: >60 mL/min (ref >60)
Glucose, Bld: 139 mg/dL — ABNORMAL HIGH (ref 70–99)
Potassium: 4.1 mmol/L (ref 3.5–5.1)
Sodium: 138 mmol/L (ref 135–145)

## 2022-01-16 LAB — RAD ONC ARIA SESSION SUMMARY
Course Elapsed Days: 4
Plan Fractions Treated to Date: 5
Plan Fractions Treated to Date: 5
Plan Fractions Treated to Date: 5
Plan Prescribed Dose Per Fraction: 3 Gy
Plan Prescribed Dose Per Fraction: 3 Gy
Plan Prescribed Dose Per Fraction: 3 Gy
Plan Total Fractions Prescribed: 10
Plan Total Fractions Prescribed: 10
Plan Total Fractions Prescribed: 10
Plan Total Prescribed Dose: 30 Gy
Plan Total Prescribed Dose: 30 Gy
Plan Total Prescribed Dose: 30 Gy
Reference Point Dosage Given to Date: 15 Gy
Reference Point Dosage Given to Date: 15 Gy
Reference Point Dosage Given to Date: 15 Gy
Reference Point Session Dosage Given: 3 Gy
Reference Point Session Dosage Given: 3 Gy
Reference Point Session Dosage Given: 3 Gy
Session Number: 5

## 2022-01-16 LAB — MAGNESIUM: Magnesium: 2.2 mg/dL (ref 1.7–2.4)

## 2022-01-16 MED ORDER — OXYCODONE HCL ER 15 MG PO T12A
15.0000 mg | EXTENDED_RELEASE_TABLET | Freq: Two times a day (BID) | ORAL | Status: DC
  Administered 2022-01-16 – 2022-01-20 (×9): 15 mg via ORAL
  Filled 2022-01-16 (×9): qty 1

## 2022-01-16 NOTE — Progress Notes (Signed)
PROGRESS NOTE  Misty Decker  UVO:[id number] DOB: 06/07/1956 DOA: 01/08/2022 PCP: Lorrene Reid, PA-C   Brief Narrative:  Patient is a 66 year old female with history of hypertension, hyperlipidemia, fibromyalgia, recent diagnosis of lung mass with metastatic disease who was admitted for the management of sinus thrombosis as per outpatient MRI.  Patient was started on anticoagulation, neurology team also consulted.  Patient was transferred to [facility] from [facility] for addition oncology evaluation.  Radiation treatment started.  Patient was also found to have metastatic bone lesions, lytic right femur lesion for which Ortho was consulted who recommended weightbearing as tolerated.  Hospital course remarkable for persistent pain, palliative care consulted fand following for pain management, titrating pain medications.   Assessment & Plan:  Principal Problem:   VTE (venous thromboembolism) Active Problems:   Essential hypertension   Blood clots in brain  Stage sinus vein thrombosis: Found as per MRI done as an outpatient.  This is secondary to malignancy induced hypercoagulability.  Neurology was also consulted on admission.  Started on Lovenox, transition to [place] on dc  Osteolytic bone lesions: Metastatic bone lesions.  Transferred to [facility] for radiation oncology treatment.  Started on radiation treatment. Found to have multiple metastatic lesions on the pelvis, proximal femur.  Orthopedics recommended weightbearing as tolerated with walker at all times, possible outpatient referral to Ortho oncology at tertiary care center. Complains of severe pain on the left thigh, left leg, tingling, numbness.  On Lyrica, oxycodone, OxyContin  Metastatic lung cancer: Recently underwent bronchoscopy with EBUS on 8/1 by pulmonology, biopsies performed.  Cytology consistent with adenocarcinoma.  Ongoing radiation treatment.  Continue pain management, supportive care.  Oncology follow-up as an  outpatient. Palliative care also consulted for goals of care.  Remains full code  Hypertension: On hydrochlorothiazide  Hyperlipidemia: On Zetia.She does not have diabetes, no history of stroke, coronary artery disease or peripheral vascular disease.No indication to start on a statin to decrease LDL to less than 70  Fibromyalgia: Continue pain management, supportive care  Urinary retention: Foley catheter was placed, added Flomax  Debility/deconditioning: PT/OT recommended home health on discharge.  Nutrition Problem: Increased nutrient needs Etiology: cancer and cancer related treatments    DVT prophylaxis:lovenox     Code Status: Full Code  Family Communication: Family at bedside on 8/9  Patient status:Inpatient  Patient is from :Home  Anticipated discharge VQ:QVZD  Estimated DC date:1-2 days.  Patient still complains of severe pain on the back and left lower extremity.  Discharge planning after adequate pain control   Consultants: Palliative care, radiation oncology  Procedures:None  Antimicrobials:  Anti-infectives (From admission, onward)    None       Subjective:  Patient seen and examined at the bedside this morning.  Hemodynamically stable.  Appears comfortable to me but when asked, she complains of severe low back pain rating to her upper thigh and legs.  Objective: Vitals:   01/15/22 0509 01/15/22 1337 01/15/22 2043 01/16/22 0418  BP: (!) 149/64 127/61 122/68 134/79  Pulse: 87 86 84 82  Resp: 18 17 18 18   Temp: 98 F (36.7 C) 97.7 F (36.5 C) 98.1 F (36.7 C) 98.4 F (36.9 C)  TempSrc: Oral Oral Oral Oral  SpO2: 97% 94% 95% 95%  Weight:      Height:        Intake/Output Summary (Last 24 hours) at 01/16/2022 1334 Last data filed at 01/16/2022 1133 Gross per 24 hour  Intake --  Output 2075 ml  Net -  2075 ml   Filed Weights   01/08/22 1923  Weight: 79.4 kg    Examination:   General exam: Appears comfortable, without any distress but  complains of significant pain  HEENT: PERRL Respiratory system:  no wheezes or crackles  Cardiovascular system: S1 & S2 heard, RRR.  Gastrointestinal system: Abdomen is nondistended, soft and nontender. Central nervous system: Alert and oriented Extremities: No edema, no clubbing ,no cyanosis Skin: No rashes, no ulcers,no icterus  GU: Foley    Data Reviewed: I have personally reviewed following labs and imaging studies  CBC: Recent Labs  Lab 01/11/22 0630 01/12/22 0456 01/13/22 0517 01/14/22 0543 01/15/22 0538  WBC 7.5 6.4 9.8 8.8 8.4  HGB 13.4 13.4 12.6 13.0 13.3  HCT 40.5 39.5 37.8 38.7 40.2  MCV 98.3 97.1 98.2 97.7 98.3  PLT 278 287 307 287 568   Basic Metabolic Panel: Recent Labs  Lab 01/12/22 0456 01/13/22 0517 01/14/22 0543 01/15/22 0538 01/16/22 0703  NA 139 139 138 138 138  K 4.1 3.5 4.0 3.9 4.1  CL 103 102 103 103 102  CO2 27 26 26 27 29   GLUCOSE 188* 149* 107* 143* 139*  BUN 17 15 20  25* 20  CREATININE 0.53 0.50 0.55 0.47 0.52  CALCIUM 9.5 9.5 9.2 9.2 9.3  MG 2.2 2.2 2.1 2.4 2.2     No results found for this or any previous visit (from the past 240 hour(s)).   Radiology Studies: No results found.  Scheduled Meds:  Chlorhexidine Gluconate Cloth  6 each Topical Daily   dexamethasone  4 mg Oral Daily   DULoxetine  20 mg Oral Daily   enoxaparin (LOVENOX) injection  80 mg Subcutaneous Q12H   ezetimibe  10 mg Oral Daily   feeding supplement (KATE FARMS STANDARD 1.4)  325 mL Oral BID BM   hydrochlorothiazide  12.5 mg Oral Daily   methocarbamol  500 mg Oral Q8H   oxyCODONE  15 mg Oral Q12H   pantoprazole  40 mg Oral Daily   polyethylene glycol  17 g Oral Daily   pregabalin  75 mg Oral Daily   senna-docusate  2 tablet Oral BID   tamsulosin  0.4 mg Oral QPC breakfast   Continuous Infusions:   LOS: 7 days   Shelly Coss, MD [facility] P8/04/2022, 1:34 PM

## 2022-01-16 NOTE — Progress Notes (Signed)
[place] Spiritual Care Note  Covering for [facility], visited with Misty Decker regarding Advance Directives consult. She reports that she already has AD paperwork completed at home and just needs to get it notarized. Today her main concern was sharing and processing health updates for her and her husband.  Provided empathic listening, pastoral reflection, and emotional support. Misty Decker is aware of ongoing chaplain availability, should needs arise or circumstances change.   [place], [place], [facility] 24/7 pager [phone]

## 2022-01-16 NOTE — Progress Notes (Signed)
Palliative:  HPI: 66 y.o. female  with past medical history of recent diagnosis of lung cancer with concern for spinal mets, sinus thrombus, HTN, HLD, fatty liver, fibromyalgia, depression admitted on 01/08/2022 with newly discovered sinus thrombus with increased pain due to new lung cancer with bone mets. She has began radiation therapy.   Patient is resting in bed, she tells me that she wants to be up and about, she wants to be out of bed and wants more treatments, however, she feels severely limited due to her pain. She has pain in mid to low back, it radiates down her left leg, she describes a throbbing and burning sensation. She expresses feeling helpless, offered her active listening and supportive empathic presence. Also discussed with her about her current pain and non pain symptom regimen.   All questions/concerns addressed. Emotional support provided.   Symptom Management:  Pain 7/10 sciatica/burning pain down left leg from low back:  OxyIR 20 mg  every 3 hours PRN. Lyrica 75 mg daily.  Added Cymbalta 20 mg daily for neuropathic pain as well as patient complaints of depression. Spiritual care consult as well.  Robaxin was noted to be helpful. Scheduled TID. May consider increased dosage if needed.  D/C fentanyl patch 50 mcg/hr and start PO OxyContin 15 mg BID for long acting pain medication and monitor.   Continue heating pad.  Continue radiation therapy and steroids.    Bowel Regimen: Continue Senokot-S 2 tabs BID.  Dulcolax suppository daily PRN.  Referral to outpatient palliative care at [place], NP recommended.    Mod MDM Loistine Chance MD Palliative Medicine Team Pager [phone] (Please see [email or web address] for schedule) Team Phone [phone]    Greater than 50%  of this time was spent counseling and coordinating care related to the above assessment and plan

## 2022-01-17 DIAGNOSIS — I8290 Acute embolism and thrombosis of unspecified vein: Secondary | ICD-10-CM | POA: Diagnosis not present

## 2022-01-17 NOTE — Progress Notes (Signed)
PROGRESS NOTE  Mackenzey Crownover Reine  PYP:[id number] DOB: [birth year] DOA: 01/08/2022 PCP: Lorrene Reid, PA-C   Brief Narrative:  Patient is a 66 year old female with history of hypertension, hyperlipidemia, fibromyalgia, recent diagnosis of lung mass with metastatic disease who was admitted for the management of sinus thrombosis as per outpatient MRI.  Patient was started on anticoagulation, neurology team also consulted.  Patient was transferred to [facility] from [facility] for addition oncology evaluation.  Radiation treatment started.  Patient was also found to have metastatic bone lesions, lytic right femur lesion for which Ortho was consulted who recommended weightbearing as tolerated.  Hospital course remarkable for persistent pain, palliative care consulted fand following for pain management, titrating pain medications.  Patient continues to complain of pain, does not feel ready to go home yet.  Requesting PT evaluation today   Assessment & Plan:  Principal Problem:   VTE (venous thromboembolism) Active Problems:   Essential hypertension   Blood clots in brain  Stage sinus vein thrombosis: Found as per MRI done as an outpatient.  This is secondary to malignancy induced hypercoagulability.  Neurology was also consulted on admission.  Started on Lovenox, transition to [place] on dc  Osteolytic bone lesions: Metastatic bone lesions.  Transferred to [facility] for radiation oncology treatment.  Started on radiation treatment. Found to have multiple metastatic lesions on the pelvis, proximal femur.  Orthopedics recommended weightbearing as tolerated with walker at all times, possible outpatient referral to Ortho oncology at tertiary care center. Complains of constant severe pain on the left thigh, left leg, tingling, numbness.  On Lyrica, oxycodone, OxyContin  Metastatic lung cancer: Recently underwent bronchoscopy with EBUS on 8/1 by pulmonology, biopsies performed.  Cytology consistent with  adenocarcinoma.  Ongoing radiation treatment.  Continue pain management, supportive care.  Oncology follow-up as an outpatient. Palliative care also consulted for goals of care.  Remains full code  Hypertension: On hydrochlorothiazide  Hyperlipidemia: On Zetia.She does not have diabetes, no history of stroke, coronary artery disease or peripheral vascular disease.No indication to start on a statin to decrease LDL to less than 70  Fibromyalgia: Continue pain management, supportive care  Urinary retention: Foley catheter was placed, added Flomax  Debility/deconditioning: PT/OT recommended home health on discharge.  We will request to have reevaluation  Nutrition Problem: Increased nutrient needs Etiology: cancer and cancer related treatments    DVT prophylaxis:lovenox     Code Status: Full Code  Family Communication: Family at bedside on 8/9  Patient status:Inpatient  Patient is from :Home  Anticipated discharge IW:[facility] vs SNF  Estimated DC date:Not sure.  Patient constantly complains of pain on her lower back, left upper thigh.  States pain medications are not helping.  Declines participating with physical therapy.   Consultants: Palliative care, radiation oncology  Procedures:None  Antimicrobials:  Anti-infectives (From admission, onward)    None       Subjective:  Patient seen and examined at the bedside this morning.  Hemodynamically stable.  Looked comfortable when I entered the room but as soon as I entered and started talking to her, she complained of low back pain, pain on the upper thigh and leg.  She states the pain medications are not helping at all.  Long discussion had at the bedside about the need of mobility.  I encouraged her to participate with physical therapy today.  Patient agreed reluctantly  Objective: Vitals:   01/16/22 0418 01/16/22 1349 01/16/22 2030 01/17/22 0529  BP: 134/79 118/62 (!) 142/59 139/66  Pulse:  82 96 88 85  Resp: 18  18 18    Temp: 98.4 F (36.9 C) (!) 97.4 F (36.3 C) 98.2 F (36.8 C) 97.8 F (36.6 C)  TempSrc: Oral Oral Oral Oral  SpO2: 95% 94% 94% 92%  Weight:      Height:        Intake/Output Summary (Last 24 hours) at 01/17/2022 1126 Last data filed at 01/17/2022 1008 Gross per 24 hour  Intake --  Output 2800 ml  Net -2800 ml   Filed Weights   01/08/22 1923  Weight: 79.4 kg    Examination:   General exam: Appears comfortable, without any distress but complains of significant pain  HEENT: PERRL Respiratory system:  no wheezes or crackles  Cardiovascular system: S1 & S2 heard, RRR.  Gastrointestinal system: Abdomen is nondistended, soft and nontender. Central nervous system: Alert and oriented Extremities: No edema, no clubbing ,no cyanosis Skin: No rashes, no ulcers,no icterus  GU: Foley    Data Reviewed: I have personally reviewed following labs and imaging studies  CBC: Recent Labs  Lab 01/11/22 0630 01/12/22 0456 01/13/22 0517 01/14/22 0543 01/15/22 0538  WBC 7.5 6.4 9.8 8.8 8.4  HGB 13.4 13.4 12.6 13.0 13.3  HCT 40.5 39.5 37.8 38.7 40.2  MCV 98.3 97.1 98.2 97.7 98.3  PLT 278 287 307 287 115   Basic Metabolic Panel: Recent Labs  Lab 01/12/22 0456 01/13/22 0517 01/14/22 0543 01/15/22 0538 01/16/22 0703  NA 139 139 138 138 138  K 4.1 3.5 4.0 3.9 4.1  CL 103 102 103 103 102  CO2 27 26 26 27 29   GLUCOSE 188* 149* 107* 143* 139*  BUN 17 15 20  25* 20  CREATININE 0.53 0.50 0.55 0.47 0.52  CALCIUM 9.5 9.5 9.2 9.2 9.3  MG 2.2 2.2 2.1 2.4 2.2     No results found for this or any previous visit (from the past 240 hour(s)).   Radiology Studies: No results found.  Scheduled Meds:  Chlorhexidine Gluconate Cloth  6 each Topical Daily   dexamethasone  4 mg Oral Daily   DULoxetine  20 mg Oral Daily   enoxaparin (LOVENOX) injection  80 mg Subcutaneous Q12H   ezetimibe  10 mg Oral Daily   feeding supplement (KATE FARMS STANDARD 1.4)  325 mL Oral BID BM    hydrochlorothiazide  12.5 mg Oral Daily   methocarbamol  500 mg Oral Q8H   oxyCODONE  15 mg Oral Q12H   pantoprazole  40 mg Oral Daily   polyethylene glycol  17 g Oral Daily   pregabalin  75 mg Oral Daily   senna-docusate  2 tablet Oral BID   tamsulosin  0.4 mg Oral QPC breakfast   Continuous Infusions:   LOS: 8 days   Shelly Coss, MD [facility] P8/05/2022, 11:26 AM

## 2022-01-17 NOTE — Progress Notes (Signed)
Palliative:  HPI: 66 y.o. female  with past medical history of recent diagnosis of lung cancer with concern for spinal mets, sinus thrombus, HTN, HLD, fatty liver, fibromyalgia, depression admitted on 01/08/2022 with newly discovered sinus thrombus with increased pain due to new lung cancer with bone mets. She has began radiation therapy.   Patient is resting in bed, she tells me that she wants to be up and about, she wants to be out of bed and wants more treatments, however, she feels severely limited due to her pain. She has pain in mid to low back, it radiates down her left leg, she describes a throbbing and burning sensation. She expresses feeling helpless, offered her active listening and supportive empathic presence. Also discussed with her about her current pain and non pain symptom regimen.   All questions/concerns addressed. Emotional support provided.   Symptom Management:  Pain 7/10 sciatica/burning pain down left leg from low back:  OxyIR 20 mg  every 3 hours PRN. Lyrica 75 mg daily.  Also on Cymbalta 20 mg daily for neuropathic pain as well as patient complaints of depression. Spiritual care consult as well.  Robaxin was noted to be helpful. Scheduled TID.    PO OxyContin 15 mg BID for long acting pain medication and monitor.   Continue heating pad.  Continue radiation therapy and steroids.    Bowel Regimen: Continue Senokot-S 2 tabs BID.  Dulcolax suppository daily PRN.  Referral to outpatient palliative care at [place], NP recommended.    Mod MDM Loistine Chance MD Palliative Medicine Team Pager [phone] (Please see [email or web address] for schedule) Team Phone [phone]    Greater than 50%  of this time was spent counseling and coordinating care related to the above assessment and plan

## 2022-01-18 DIAGNOSIS — I8290 Acute embolism and thrombosis of unspecified vein: Secondary | ICD-10-CM | POA: Diagnosis not present

## 2022-01-18 MED ORDER — APIXABAN 5 MG PO TABS
10.0000 mg | ORAL_TABLET | Freq: Two times a day (BID) | ORAL | Status: AC
  Administered 2022-01-18 – 2022-01-24 (×13): 10 mg via ORAL
  Filled 2022-01-18 (×13): qty 2

## 2022-01-18 MED ORDER — APIXABAN 5 MG PO TABS
5.0000 mg | ORAL_TABLET | Freq: Two times a day (BID) | ORAL | Status: DC
  Administered 2022-01-25 – 2022-01-28 (×7): 5 mg via ORAL
  Filled 2022-01-18 (×7): qty 1

## 2022-01-18 NOTE — Progress Notes (Signed)
PROGRESS NOTE  Misty Decker  ZOX:[id number] DOB: February 11, 1956 DOA: 01/08/2022 PCP: Lorrene Reid, PA-C   Brief Narrative:  Patient is a 66 year old female with history of hypertension, hyperlipidemia, fibromyalgia, recent diagnosis of lung mass with metastatic disease who was admitted for the management of sinus thrombosis as per outpatient MRI.  Patient was started on anticoagulation, neurology team also consulted.  Patient was transferred to [facility] from [facility] for addition oncology evaluation.  Radiation treatment started.  Patient was also found to have metastatic bone lesions, lytic right femur lesion for which Ortho was consulted who recommended weightbearing as tolerated.  Hospital course remarkable for persistent pain, palliative care consulted fand following for pain management, titrating pain medications.  Patient continues to complain of pain, does not feel ready to go home yet.  Requesting PT evaluation   Assessment & Plan:  Principal Problem:   VTE (venous thromboembolism) Active Problems:   Essential hypertension   Blood clots in brain  Stage sinus vein thrombosis: Found as per MRI done as an outpatient.  This is secondary to malignancy induced hypercoagulability.  Neurology was also consulted on admission.  Started on Lovenox, transitioned to DOAC   Osteolytic bone lesions: Metastatic bone lesions.  Transferred to [facility] for radiation oncology treatment.  Started on radiation treatment. Found to have multiple metastatic lesions on the pelvis, proximal femur.  Orthopedics recommended weightbearing as tolerated with walker at all times, possible outpatient referral to Ortho oncology at tertiary care center. Complains of constant severe pain on the left thigh, left leg, tingling, numbness.  On Lyrica, oxycodone, OxyContin.  Palliative care managing the pain medications  Metastatic lung cancer: Recently underwent bronchoscopy with EBUS on 8/1 by pulmonology, biopsies  performed.  Cytology consistent with adenocarcinoma.  Ongoing radiation treatment.  Continue pain management, supportive care.  Oncology follow-up as an outpatient. Palliative care also consulted for goals of care.  Remains full code  Hypertension: On hydrochlorothiazide  Hyperlipidemia: On Zetia.She does not have diabetes, no history of stroke, coronary artery disease or peripheral vascular disease.No indication to start on a statin to decrease LDL to less than 70  Fibromyalgia: Continue pain management, supportive care  Urinary retention: Foley catheter was placed, added Flomax  Debility/deconditioning: PT/OT recommended home health on discharge.  We have  requested to have reevaluation  Nutrition Problem: Increased nutrient needs Etiology: cancer and cancer related treatments    DVT prophylaxis:lovenox apixaban (ELIQUIS) tablet 10 mg  apixaban (ELIQUIS) tablet 5 mg     Code Status: Full Code  Family Communication: Family at bedside on 8/9  Patient status:Inpatient  Patient is from :Home  Anticipated discharge WJ:[facility] vs SNF  Estimated DC date:Not sure.  Patient constantly complains of pain on her lower back, left upper thigh.  States pain medications are not helping.  Reluctant to participate with physical therapy.   Consultants: Palliative care, radiation oncology  Procedures:None  Antimicrobials:  Anti-infectives (From admission, onward)    None       Subjective:  Patient seen and examined at the bedside this morning.  Hemodynamically stable.  Lying on bed.  Continues to complain of pain on her lower back and left hip, left upper thigh.  States that she did not sleep last night.  No improvement with pain medications  Objective: Vitals:   01/17/22 0529 01/17/22 1408 01/17/22 1936 01/18/22 0611  BP: 139/66 (!) 128/53 136/60 110/62  Pulse: 85 87 96 86  Resp: 18 18 18 16   Temp: 97.8 F (36.6 C)  98 F (36.7 C) 98.9 F (37.2 C) 97.9 F (36.6 C)  TempSrc:  Oral Oral Oral Oral  SpO2: 92%  91% 94%  Weight:      Height:        Intake/Output Summary (Last 24 hours) at 01/18/2022 1130 Last data filed at 01/18/2022 0900 Gross per 24 hour  Intake 480 ml  Output 1675 ml  Net -1195 ml   Filed Weights   01/08/22 1923  Weight: 79.4 kg    Examination:   General exam: Overall comfortable, not in distress HEENT: PERRL Respiratory system:  no wheezes or crackles  Cardiovascular system: S1 & S2 heard, RRR.  Gastrointestinal system: Abdomen is nondistended, soft and nontender. Central nervous system: Alert and oriented Extremities: No edema, no clubbing ,no cyanosis Skin: No rashes, no ulcers,no icterus   GU: Foley   Data Reviewed: I have personally reviewed following labs and imaging studies  CBC: Recent Labs  Lab 01/12/22 0456 01/13/22 0517 01/14/22 0543 01/15/22 0538  WBC 6.4 9.8 8.8 8.4  HGB 13.4 12.6 13.0 13.3  HCT 39.5 37.8 38.7 40.2  MCV 97.1 98.2 97.7 98.3  PLT 287 307 287 116   Basic Metabolic Panel: Recent Labs  Lab 01/12/22 0456 01/13/22 0517 01/14/22 0543 01/15/22 0538 01/16/22 0703  NA 139 139 138 138 138  K 4.1 3.5 4.0 3.9 4.1  CL 103 102 103 103 102  CO2 27 26 26 27 29   GLUCOSE 188* 149* 107* 143* 139*  BUN 17 15 20  25* 20  CREATININE 0.53 0.50 0.55 0.47 0.52  CALCIUM 9.5 9.5 9.2 9.2 9.3  MG 2.2 2.2 2.1 2.4 2.2     No results found for this or any previous visit (from the past 240 hour(s)).   Radiology Studies: No results found.  Scheduled Meds:  apixaban  10 mg Oral BID   Followed by   Derrill Memo ON 01/25/2022] apixaban  5 mg Oral BID   Chlorhexidine Gluconate Cloth  6 each Topical Daily   dexamethasone  4 mg Oral Daily   DULoxetine  20 mg Oral Daily   ezetimibe  10 mg Oral Daily   feeding supplement (KATE FARMS STANDARD 1.4)  325 mL Oral BID BM   hydrochlorothiazide  12.5 mg Oral Daily   methocarbamol  500 mg Oral Q8H   oxyCODONE  15 mg Oral Q12H   pantoprazole  40 mg Oral Daily    polyethylene glycol  17 g Oral Daily   pregabalin  75 mg Oral Daily   senna-docusate  2 tablet Oral BID   tamsulosin  0.4 mg Oral QPC breakfast   Continuous Infusions:   LOS: 9 days   Shelly Coss, MD [facility] P8/13/2023, 11:30 AM

## 2022-01-18 NOTE — Progress Notes (Signed)
PT Cancellation Note  Patient Details Name: Misty Decker MRN: [id number] DOB: [birth year]   Cancelled Treatment:     PT re-order received but eval deferred this date.  Pt declines 2* fatigue and pain level.  Will follow.   Viral Schramm 01/18/2022, 12:49 PM

## 2022-01-18 NOTE — Progress Notes (Signed)
OT Cancellation Note  Patient Details Name: Misty Decker MRN: [id number] DOB: 02/15/1956   Cancelled Treatment:    Reason Eval/Treat Not Completed: Other (comment). Pt reports not sleeping well last night and desires to prioritize rest today. Discussed reattempting at a later date, pt agreeable to plan.  Tyrone Schimke, OT Acute Rehabilitation Services Office: [phone]  01/18/2022, 11:21 AM

## 2022-01-18 NOTE — Progress Notes (Signed)
Palliative:  HPI: 66 y.o. female  with past medical history of recent diagnosis of lung cancer with concern for spinal mets, sinus thrombus, HTN, HLD, fatty liver, fibromyalgia, depression admitted on 01/08/2022 with newly discovered sinus thrombus with increased pain due to new lung cancer with bone mets. She has began radiation therapy.   Patient is resting in bed, she continues to complain about pain and discomfort especially in her left lower extremity.  She hopes to increase her physical activity.  Gently discussed with her about scope of current hospitalization and her overall goals of care were again discussed.     All questions/concerns addressed. Emotional support provided.   Symptom Management:  Pain 7/10 sciatica/burning pain down left leg from low back:  OxyIR 20 mg  every 3 hours PRN. Lyrica 75 mg daily.  Also on Cymbalta 20 mg daily for neuropathic pain as well as patient complaints of depression. Spiritual care consult as well.  Robaxin was noted to be helpful. Scheduled TID.    PO OxyContin 15 mg BID for long acting pain medication and monitor.   Continue heating pad.  Continue radiation therapy and steroids.    Bowel Regimen: Continue Senokot-S 2 tabs BID.  Dulcolax suppository daily PRN.  Referral to outpatient palliative care at [place], NP recommended.    Mod MDM Loistine Chance MD Palliative Medicine Team Pager [phone] (Please see [email or web address] for schedule) Team Phone [phone]    Greater than 50%  of this time was spent counseling and coordinating care related to the above assessment and plan

## 2022-01-18 NOTE — Progress Notes (Signed)
[facility] for apixaban Indication: Venous sinus thrombus  Allergies  Allergen Reactions   Codeine     Nausea and vomiting, hallucinations, bad dreams    Morphine Shortness Of Breath    B/P dropped!   Beef-Derived Products Nausea And Vomiting    Alpha Gal   Doxycycline Nausea And Vomiting   Ibuprofen     Stomach Ulcers, stomach scarred    Iodinated Contrast Media Nausea And Vomiting    Pt reports n/v only. No other reaction noted. No need for 13 hr prep.    Latex Rash   Nsaids Other (See Comments)    PT has hx of ulcers.   Penicillins Nausea And Vomiting    hives   Pork-Derived Products Nausea And Vomiting   Tramadol     Caused RLS   Gabapentin Other (See Comments)    Pt states this made her feel like she was always depressed and not caring about anything    Patient Measurements: Height: 5\' 3"  (160 cm) Weight: 79.4 kg (175 lb) IBW/kg (Calculated) : 52.4  Vital Signs: Temp: 97.9 F (36.6 C) (08/13 0611) Temp Source: Oral (08/13 0611) BP: 110/62 (08/13 0611) Pulse Rate: 86 (08/13 0611)  Labs: Recent Labs    01/16/22 0703  CREATININE 0.52     Estimated Creatinine Clearance: 69.9 mL/min (by C-G formula based on SCr of 0.52 mg/dL).   Medical History: Past Medical History:  Diagnosis Date   Complication of anesthesia    "Hard to sedate", per pt and slow to wake up   COVID 10/2020   Degenerative joint disease    s/p cervical and lumbar fusions   Depression    pt denies   Encounter for preventive health examination    Next mammogram in 07/2009. Next colonoscopy in 09/2017. DEXA 4/09>Normal   Encounter for screening colonoscopy 10/03/2007   Normal   Fatty liver    Fibromyalgia    GERD (gastroesophageal reflux disease)    Hemorrhoid    hx of   History of hiatal hernia    Hyperlipidemia    Hypertension    Sciatica     Assessment: 89 YOF presenting with HA, outpt MRI shows straight sinus thrombosis.  Not on  anticoagulation PTA.  Pharmacy was initially consulted for Lovenox dosing, but is now consulted to change to apixaban.    SCr 0.5 CBC: Hgb and Plt WNL No bleeding or complications reported Copay check shows $0 copay for apixaban  Goal of Therapy:  Anti-Xa level 0.6-1 units/ml 4hrs after LMWH dose given Monitor platelets by anticoagulation protocol: Yes   Plan:  Apixaban 10mg  PO BID x7 days, followed by 5 mg PO BID.  Monitor CBC, s/s bleeding Pharmacy to provide education prior to discharge.   F/u plans for discharge   Thank you for allowing pharmacy to be a part of this patient's care.  Gretta Arab PharmD, BCPS Clinical Pharmacist [facility] main pharmacy [phone] 01/18/2022 9:28 AM

## 2022-01-18 NOTE — Discharge Instructions (Addendum)
Information on my medicine - ELIQUIS (apixaban)  This medication education was reviewed with me or my healthcare representative as part of my discharge preparation.  The pharmacist that spoke with me during my hospital stay was:  Leeroy Bock, [facility]  Why was Eliquis prescribed for you? Eliquis was prescribed to treat blood clots that may have been found in the veins of your legs (deep vein thrombosis) or in your lungs (pulmonary embolism) and to reduce the risk of them occurring again.  What do You need to know about Eliquis ? The starting dose is 10 mg (two 5 mg tablets) taken TWICE daily for the FIRST SEVEN (7) DAYS, then on 01/25/2022  the dose is reduced to ONE 5 mg tablet taken TWICE daily.  Eliquis may be taken with or without food.   Try to take the dose about the same time in the morning and in the evening. If you have difficulty swallowing the tablet whole please discuss with your pharmacist how to take the medication safely.  Take Eliquis exactly as prescribed and DO NOT stop taking Eliquis without talking to the doctor who prescribed the medication.  Stopping may increase your risk of developing a new blood clot.  Refill your prescription before you run out.  After discharge, you should have regular check-up appointments with your healthcare provider that is prescribing your Eliquis.    What do you do if you miss a dose? If a dose of ELIQUIS is not taken at the scheduled time, take it as soon as possible on the same day and twice-daily administration should be resumed. The dose should not be doubled to make up for a missed dose.  Important Safety Information A possible side effect of Eliquis is bleeding. You should call your healthcare provider right away if you experience any of the following: Bleeding from an injury or your nose that does not stop. Unusual colored urine (red or dark brown) or unusual colored stools (red or black). Unusual bruising for unknown  reasons. A serious fall or if you hit your head (even if there is no bleeding).  Some medicines may interact with Eliquis and might increase your risk of bleeding or clotting while on Eliquis. To help avoid this, consult your healthcare provider or pharmacist prior to using any new prescription or non-prescription medications, including herbals, vitamins, non-steroidal anti-inflammatory drugs (NSAIDs) and supplements.  This website has more information on Eliquis (apixaban): http://www.eliquis.com/eliquis/home   Additional Discharge Instructions   Please get your medications reviewed and adjusted by your Primary MD.  Please request your Primary MD to go over all Hospital Tests and Procedure/Radiological results at the follow up, please get all Hospital records sent to your Prim MD by signing hospital release before you go home.  If you had Pneumonia of Lung problems at the Hospital: Please get a 2 view Chest X ray done in approximately 4 weeks after hospital discharge or sooner if instructed by your Primary MD.  If you have Congestive Heart Failure: Please call your Cardiologist or Primary MD anytime you have any of the following symptoms:  1) 3 pound weight gain in 24 hours or 5 pounds in 1 week  2) shortness of breath, with or without a dry hacking cough  3) swelling in the hands, feet or stomach  4) if you have to sleep on extra pillows at night in order to breathe  Follow cardiac low salt diet and 1.5 lit/day fluid restriction.  If you have diabetes Accuchecks 4  times/day, Once in AM empty stomach and then before each meal. Log in all results and show them to your primary doctor at your next visit. If any glucose reading is under 80 or above 300 call your primary MD immediately.  If you have Seizure/Convulsions/Epilepsy: Please do not drive, operate heavy machinery, participate in activities at heights or participate in high speed sports until you have seen by Primary MD or a  Neurologist and advised to do so again. Per [facility] statutes, patients with seizures are not allowed to drive until they have been seizure-free for six months.  Use caution when using heavy equipment or power tools. Avoid working on ladders or at heights. Take showers instead of baths. Ensure the water temperature is not too high on the home water heater. Do not go swimming alone. Do not lock yourself in a room alone (i.e. bathroom). When caring for infants or small children, sit down when holding, feeding, or changing them to minimize risk of injury to the child in the event you have a seizure. Maintain good sleep hygiene. Avoid alcohol.   If you had Gastrointestinal Bleeding: Please ask your Primary MD to check a complete blood count within one week of discharge or at your next visit. Your endoscopic/colonoscopic biopsies that are pending at the time of discharge, will also need to followed by your Primary MD.  Get Medicines reviewed and adjusted. Please take all your medications with you for your next visit with your Primary MD  Please request your Primary MD to go over all hospital tests and procedure/radiological results at the follow up, please ask your Primary MD to get all Hospital records sent to his/her office.  If you experience worsening of your admission symptoms, develop shortness of breath, life threatening emergency, suicidal or homicidal thoughts you must seek medical attention immediately by calling 911 or calling your MD immediately  if symptoms less severe.  You must read complete instructions/literature along with all the possible adverse reactions/side effects for all the Medicines you take and that have been prescribed to you. Take any new Medicines after you have completely understood and accpet all the possible adverse reactions/side effects.   Do not drive or operate heavy machinery when taking Pain medications.   Do not take more than prescribed Pain, Sleep and  Anxiety Medications  Special Instructions: If you have smoked or chewed Tobacco  in the last 2 yrs please stop smoking, stop any regular Alcohol  and or any Recreational drug use.  Wear Seat belts while driving.  Please note You were cared for by a hospitalist during your hospital stay. If you have any questions about your discharge medications or the care you received while you were in the hospital after you are discharged, you can call the unit and asked to speak with the hospitalist on call if the hospitalist that took care of you is not available. Once you are discharged, your primary care physician will handle any further medical issues. Please note that NO REFILLS for any discharge medications will be authorized once you are discharged, as it is imperative that you return to your primary care physician (or establish a relationship with a primary care physician if you do not have one) for your aftercare needs so that they can reassess your need for medications and monitor your lab values.  You can reach the hospitalist office at phone [phone] or fax [phone]   If you do not have a primary care physician, you can  call [phone] for a physician referral.

## 2022-01-19 ENCOUNTER — Other Ambulatory Visit: Payer: Self-pay

## 2022-01-19 ENCOUNTER — Ambulatory Visit
Admit: 2022-01-19 | Discharge: 2022-01-19 | Disposition: A | Discharge status: Home / Self Care | Payer: Medicare Other | Attending: Radiation Oncology | Admitting: Radiation Oncology

## 2022-01-19 DIAGNOSIS — I8290 Acute embolism and thrombosis of unspecified vein: Secondary | ICD-10-CM | POA: Diagnosis not present

## 2022-01-19 LAB — RAD ONC ARIA SESSION SUMMARY
Course Elapsed Days: 7
Plan Fractions Treated to Date: 6
Plan Fractions Treated to Date: 6
Plan Fractions Treated to Date: 6
Plan Prescribed Dose Per Fraction: 3 Gy
Plan Prescribed Dose Per Fraction: 3 Gy
Plan Prescribed Dose Per Fraction: 3 Gy
Plan Total Fractions Prescribed: 10
Plan Total Fractions Prescribed: 10
Plan Total Fractions Prescribed: 10
Plan Total Prescribed Dose: 30 Gy
Plan Total Prescribed Dose: 30 Gy
Plan Total Prescribed Dose: 30 Gy
Reference Point Dosage Given to Date: 18 Gy
Reference Point Dosage Given to Date: 18 Gy
Reference Point Dosage Given to Date: 18 Gy
Reference Point Session Dosage Given: 3 Gy
Reference Point Session Dosage Given: 3 Gy
Reference Point Session Dosage Given: 3 Gy
Session Number: 6

## 2022-01-19 NOTE — TOC Progression Note (Signed)
Transition of Care [facility]) - Progression Note    Patient Details  Name: Misty Decker MRN: [id number] Date of Birth: [birth year]  Transition of Care [facility]) CM/SW Rogers, LCSW Phone Number: 01/19/2022, 12:32 PM  Clinical Narrative:    CSW spoke with Pt to discuss PT recommendations of [place] services, pt stated she is waiting to discuss her options with palliative. She is requesting a call back about [facility] services. CSW will await palliative recommendations   Expected Discharge Plan: [place] Barriers to Discharge: Continued Medical Work up  Expected Discharge Plan and Services Expected Discharge Plan: [place] In-house Referral: Clinical Social Work     Living arrangements for the past 2 months: Single Family Home                                       Social Determinants of Health (SDOH) Interventions    Readmission Risk Interventions     No data to display

## 2022-01-19 NOTE — Progress Notes (Addendum)
Palliative:  HPI: 66 y.o. female  with past medical history of recent diagnosis of lung cancer with concern for spinal mets, sinus thrombus, HTN, HLD, fatty liver, fibromyalgia, depression admitted on 01/08/2022 with newly discovered sinus thrombus with increased pain due to new lung cancer with bone mets. She has began radiation therapy.   Patient is resting in bed, she continues to complain about pain and discomfort especially in her left lower extremity.  She hopes to increase her physical activity.  Gently discussed with her about scope of current hospitalization and her overall goals of care were again discussed.     All questions/concerns addressed. Emotional support provided.   Symptom Management:  Pain 7/10 sciatica/burning pain down left leg from low back:  OxyIR 20 mg  every 3 hours PRN. Lyrica 75 mg daily.  Also on Cymbalta 20 mg daily for neuropathic pain as well as patient complaints of depression. Spiritual care consult as well.  Robaxin was noted to be helpful. Scheduled TID.    PO OxyContin 15 mg BID for long acting pain medication and monitor.   Continue heating pad.  Continue radiation therapy and steroids.    Bowel Regimen: Continue Senokot-S 2 tabs BID.  Dulcolax suppository daily PRN.  Referral to outpatient palliative care at [place], NP recommended.  Encouraged patient to consider PT and OOB. Spent some time with patient today, talking about her goals of care. Patient doesn't want hospice, wants to continue with radiation and wants to continue her care at the [facility]. I told her about my colleague Ms Cousar NP and that she can see her for pain management at the cancer center. We talked about the differences between home with palliative versus home with hospice. Patient simply states that she doesn't see the value of participating with PT, she wishes to further discuss with her family regarding her choices, PMT to follow up  tomorrow.  Mod MDM Loistine Chance MD Palliative Medicine Team Pager [phone] (Please see [email or web address] for schedule) Team Phone [phone]    Greater than 50%  of this time was spent counseling and coordinating care related to the above assessment and plan

## 2022-01-19 NOTE — Progress Notes (Signed)
PROGRESS NOTE  Misty Decker  JAS:[id number] DOB: [birth year] DOA: 01/08/2022 PCP: Lorrene Reid, PA-C   Brief Narrative:  Patient is a 66 year old female with history of hypertension, hyperlipidemia, fibromyalgia, recent diagnosis of lung mass with metastatic disease who was admitted for the management of sinus thrombosis as per outpatient MRI.  Patient was started on anticoagulation, neurology team also consulted.  Patient was transferred to [facility] from [facility] for addition oncology evaluation.  Radiation treatment started.  Patient was also found to have metastatic bone lesions, lytic right femur lesion for which Ortho was consulted who recommended weightbearing as tolerated.  Hospital course remarkable for persistent pain, palliative care consulted and following for pain management, titrating pain medications.  Patient continues to complain of pain, does not feel ready to go home yet.   Assessment & Plan:  Principal Problem:   VTE (venous thromboembolism) Active Problems:   Essential hypertension   Blood clots in brain  Stage sinus vein thrombosis: Found as per MRI done as an outpatient.  This is secondary to malignancy induced hypercoagulability.  Neurology was also consulted on admission.  Started on Lovenox, transitioned to DOAC   Osteolytic bone lesions: Metastatic bone lesions.  Transferred to [facility] for radiation oncology treatment.  Started on radiation treatment. Found to have multiple metastatic lesions on the pelvis, proximal femur.  Orthopedics recommended weightbearing as tolerated with walker at all times, possible outpatient referral to Ortho oncology at tertiary care center. Complains of constant severe pain on the left thigh, left leg, tingling, numbness.  On Lyrica, oxycodone, OxyContin.  Palliative care managing the pain medications  Metastatic lung cancer: Recently underwent bronchoscopy with EBUS on 8/1 by pulmonology, biopsies performed.  Cytology consistent  with adenocarcinoma.  Ongoing radiation treatment.  Continue pain management, supportive care.  Oncology follow-up as an outpatient. Palliative care also consulted for goals of care.  Remains full code  Hypertension: On hydrochlorothiazide  Hyperlipidemia: On Zetia.She does not have diabetes, no history of stroke, coronary artery disease or peripheral vascular disease.No indication to start on a statin to decrease LDL to less than 70  Fibromyalgia: Continue pain management, supportive care  Urinary retention: Foley catheter was placed, added Flomax  Debility/deconditioning: PT/OT recommended home health on discharge.  We have  requested to have reevaluation,but patient declines  Nutrition Problem: Increased nutrient needs Etiology: cancer and cancer related treatments    DVT prophylaxis:lovenox apixaban (ELIQUIS) tablet 10 mg  apixaban (ELIQUIS) tablet 5 mg     Code Status: Full Code  Family Communication: Family at bedside on 8/9  Patient status:Inpatient  Patient is from :Home  Anticipated discharge AL:PFXT with home health  Estimated DC date:Not sure.  Patient constantly complains of pain on her lower back, left upper thigh.  States pain medications are not helping.  Reluctant to participate with physical therapy.   Consultants: Palliative care, radiation oncology  Procedures:None  Antimicrobials:  Anti-infectives (From admission, onward)    None       Subjective:  Patient seen and examined at the bedside this morning.  Continues to complain of pain like yesterday.  States that pain medication are not helping.  when I entered the room, she looked comfortable and not in any kind of distress, speaking in full sentences   Objective: Vitals:   01/18/22 0611 01/18/22 1346 01/18/22 2048 01/19/22 0516  BP: 110/62 128/69 125/61 (!) 151/88  Pulse: 86 82 95 92  Resp: 16 14 20 16   Temp: 97.9 F (36.6 C) 97.9 F (  36.6 C) 98.2 F (36.8 C) 98 F (36.7 C)  TempSrc:  Oral Oral Oral Oral  SpO2: 94% 92% 93% 94%  Weight:      Height:        Intake/Output Summary (Last 24 hours) at 01/19/2022 1117 Last data filed at 01/19/2022 0800 Gross per 24 hour  Intake 240 ml  Output 1425 ml  Net -1185 ml   Filed Weights   01/08/22 1923  Weight: 79.4 kg    Examination:   General exam: Overall comfortable, not in distress HEENT: PERRL Respiratory system:  no wheezes or crackles  Cardiovascular system: S1 & S2 heard, RRR.  Gastrointestinal system: Abdomen is nondistended, soft and nontender. Central nervous system: Alert and oriented Extremities: No edema, no clubbing ,no cyanosis Skin: No rashes, no ulcers,no icterus   GU: Foley   Data Reviewed: I have personally reviewed following labs and imaging studies  CBC: Recent Labs  Lab 01/13/22 0517 01/14/22 0543 01/15/22 0538  WBC 9.8 8.8 8.4  HGB 12.6 13.0 13.3  HCT 37.8 38.7 40.2  MCV 98.2 97.7 98.3  PLT 307 287 295   Basic Metabolic Panel: Recent Labs  Lab 01/13/22 0517 01/14/22 0543 01/15/22 0538 01/16/22 0703  NA 139 138 138 138  K 3.5 4.0 3.9 4.1  CL 102 103 103 102  CO2 26 26 27 29   GLUCOSE 149* 107* 143* 139*  BUN 15 20 25* 20  CREATININE 0.50 0.55 0.47 0.52  CALCIUM 9.5 9.2 9.2 9.3  MG 2.2 2.1 2.4 2.2     No results found for this or any previous visit (from the past 240 hour(s)).   Radiology Studies: No results found.  Scheduled Meds:  apixaban  10 mg Oral BID   Followed by   Derrill Memo ON 01/25/2022] apixaban  5 mg Oral BID   Chlorhexidine Gluconate Cloth  6 each Topical Daily   dexamethasone  4 mg Oral Daily   DULoxetine  20 mg Oral Daily   ezetimibe  10 mg Oral Daily   feeding supplement ([employer] STANDARD 1.4)  325 mL Oral BID BM   hydrochlorothiazide  12.5 mg Oral Daily   methocarbamol  500 mg Oral Q8H   oxyCODONE  15 mg Oral Q12H   pantoprazole  40 mg Oral Daily   polyethylene glycol  17 g Oral Daily   pregabalin  75 mg Oral Daily   senna-docusate  2 tablet  Oral BID   tamsulosin  0.4 mg Oral QPC breakfast   Continuous Infusions:   LOS: 10 days   Shelly Coss, MD [facility] P8/14/2023, 11:17 AM

## 2022-01-19 NOTE — Progress Notes (Signed)
Nutrition Follow-up  DOCUMENTATION CODES:   Not applicable  INTERVENTION:  - continue [employer] 1.4 po BID. - complete NFPE when feasible.  - re-weigh patient today.  Noted allergies to beef-derived products and pork-derived products   NUTRITION DIAGNOSIS:   Increased nutrient needs related to cancer and cancer related treatments as evidenced by estimated needs. -ongoing  GOAL:   Patient will meet greater than or equal to 90% of their needs   MONITOR:   PO intake, Supplement acceptance, Labs, Weight trends, I & O's  ASSESSMENT:   66 year old with history of  HTN, HLD, fibromyalgia and recent diagnosis of lung mass with metastatic disease and outpatient MRI brain done which showed sinus thrombosis therefore admitted to the hospital.  Patient was started on anticoagulation, neurology team was consulted.  Patient was transferred to [facility] long hospital from [facility] for radiation oncology evaluation who placed [facility] 8/4 with plans for 10 radiation treatments per Dr Tammi Klippel.  Patient is currently out of the room to XRT. Review of order indicates she has been accepting [employer] supplement 100% of the time offered. Review of flow sheet documentation shows that she has been eating 75-100% at meals since breakfast on 8/7. Most recently documented meal intakes were 100% of lunch on 8/12 and 80% of lunch on 8/13.  She has not been weighed since admission on 8/3. Mild pitting edema to BLE documented in the edema section of flow sheet.   Palliative Care following inpatient and recommendation for ongoing support at [facility] following discharge.    Labs reviewed. Medications reviewed; 12.5 mg hydrodiuril/day, 40 mg oral protonix/day, 17 g miralax/day, 2 tablets senokot BID.   NUTRITION - FOCUSED PHYSICAL EXAM:  Patient out of the room to XRT.  Diet Order:   Diet Order             Diet Heart Room service appropriate? Yes; Fluid consistency: Thin  Diet effective now                    EDUCATION NEEDS:   No education needs have been identified at this time  Skin:  Skin Assessment: Reviewed RN Assessment  Last BM:  8/10 (type 2 x1, medium amount)  Height:   Ht Readings from Last 1 Encounters:  01/08/22 5\' 3"  (1.6 m)    Weight:   Wt Readings from Last 1 Encounters:  01/08/22 79.4 kg     BMI:  Body mass index is 31 kg/m.  Estimated Nutritional Needs:  Kcal:  1600-1800 Protein:  75-90g Fluid:  1.8L/day      Jarome Matin, MS, RD, LDN, CNSC Registered Dietitian II Inpatient Clinical Nutrition RD pager # and on-call/weekend pager # available in [place]

## 2022-01-19 NOTE — Progress Notes (Signed)
Chaplain engaged in an initial visit with Jasmon and her sisters.  Chaplain offered reflective listening as Katarzyna shared about her healthcare journey.  Tericka and her family at bedside voiced not knowing what exactly Cherrell has been diagnosed with and not knowing what interventions exist for her.  Beckett and family conveyed not having clarity about Malary's health and body right now.  The feeling of not knowing what is happening has left [place] feeling anxious.  She noted that one of the attending physicians began talking about hospice right away without ever telling her what she was facing.    During time together, Locklyn and family voiced the importance of having a family meeting.  Kearsten would like to involve her two daughters in the conversation because they do not understand the severity of what Lakayla has been facing.  They would like a space to ask questions, understand options, and outline what Angelisa desires.  They shared about their parents dying suddenly and not having the ability to even choose what would happen.  Agency and having some input on what is happening is important especially since they didn't have it with their parents.   Mahlani also spent time talking about her pain from physical therapy. Chaplain could assess that Sahvannah is in a place of needing to firmly declare what she wants.  She voiced that after she has PT she is in pain for days after that it doesn't yield her any quality of life.  She also stated that she hasn't been able to do any of the things that she loves which leaves her unfulfilled in living.  Chaplain believes Nickola is trying to come to terms with what she sees and believes is happening, while also trying to honor that she needs to keep fighting.    Chaplain provided space for story-telling and for Pasqualina to process.  Chaplain worked to let her know that her voice is important and that she is not a burden on the medical system.  Chaplain offered advocacy support and will follow-up  with Palliative Care.     01/19/22 1600  Clinical Encounter Type  Visited With Patient and family together  Visit Type Spiritual support  Referral From Palliative care team  Consult/Referral To Chaplain  Stress Factors  Patient Stress Factors Major life changes;Health changes;Loss of control;Exhausted;Family relationships

## 2022-01-19 NOTE — Progress Notes (Signed)
Physical Therapy Discharge Patient Details Name: Misty Decker MRN: [id number] DOB: 06/06/1956 Today's Date: 01/19/2022 Time:  -     Patient discharged from PT services secondary to  patient declining  need for services at this time. Refer to OT note from today.  [place] Office [phone] Weekend [phone]  GP     [name] 01/19/2022, 10:15 AM

## 2022-01-19 NOTE — Progress Notes (Signed)
POT Cancellation Note  Patient Details Name: Misty Decker MRN: [id number] DOB: 21-May-1956   Cancelled Treatment:    Reason Eval/Treat Not Completed: Other (comment) Patient continues to decline to participate in therapy reporting that therapy saw her last night and it was extremely painful. Patient declined to participate in therapy on 8/13 no therapy in PM hours.  Patient spoke in circles about "what is the point of therapy?" And "I was doing this at home myself". No family in room at this time but noted to have placed a "no therapy" sign on door last week when therapy signed off the last time. OT singing off again at this time.  Jackelyn Poling OTR/L, [place] [facility] Office# [phone] Pager# [phone]  01/19/2022, 8:34 AM

## 2022-01-19 NOTE — Care Management Important Message (Signed)
Important Message  Patient Details IM Letter given to the Patient. Name: Misty Decker MRN: [id number] Date of Birth: Sep 28, 1955   Medicare Important Message Given:  Yes     Chelsei, [place] 01/19/2022, 11:03 AM

## 2022-01-20 ENCOUNTER — Encounter (HOSPITAL_COMMUNITY): Payer: Self-pay | Admitting: General Practice

## 2022-01-20 ENCOUNTER — Encounter: Payer: Self-pay | Admitting: *Deleted

## 2022-01-20 ENCOUNTER — Other Ambulatory Visit: Payer: Self-pay

## 2022-01-20 ENCOUNTER — Ambulatory Visit
Admit: 2022-01-20 | Discharge: 2022-01-20 | Disposition: A | Discharge status: Home / Self Care | Payer: Medicare Other | Attending: Radiation Oncology | Admitting: Radiation Oncology

## 2022-01-20 DIAGNOSIS — Z515 Encounter for palliative care: Secondary | ICD-10-CM | POA: Diagnosis not present

## 2022-01-20 DIAGNOSIS — Z7189 Other specified counseling: Secondary | ICD-10-CM | POA: Diagnosis not present

## 2022-01-20 DIAGNOSIS — C419 Malignant neoplasm of bone and articular cartilage, unspecified: Secondary | ICD-10-CM | POA: Diagnosis not present

## 2022-01-20 DIAGNOSIS — C3492 Malignant neoplasm of unspecified part of left bronchus or lung: Secondary | ICD-10-CM

## 2022-01-20 DIAGNOSIS — I669 Occlusion and stenosis of unspecified cerebral artery: Secondary | ICD-10-CM

## 2022-01-20 DIAGNOSIS — I8290 Acute embolism and thrombosis of unspecified vein: Secondary | ICD-10-CM | POA: Diagnosis not present

## 2022-01-20 LAB — RAD ONC ARIA SESSION SUMMARY
Course Elapsed Days: 8
Plan Fractions Treated to Date: 7
Plan Fractions Treated to Date: 7
Plan Fractions Treated to Date: 7
Plan Prescribed Dose Per Fraction: 3 Gy
Plan Prescribed Dose Per Fraction: 3 Gy
Plan Prescribed Dose Per Fraction: 3 Gy
Plan Total Fractions Prescribed: 10
Plan Total Fractions Prescribed: 10
Plan Total Fractions Prescribed: 10
Plan Total Prescribed Dose: 30 Gy
Plan Total Prescribed Dose: 30 Gy
Plan Total Prescribed Dose: 30 Gy
Reference Point Dosage Given to Date: 21 Gy
Reference Point Dosage Given to Date: 21 Gy
Reference Point Dosage Given to Date: 21 Gy
Reference Point Session Dosage Given: 3 Gy
Reference Point Session Dosage Given: 3 Gy
Reference Point Session Dosage Given: 3 Gy
Session Number: 7

## 2022-01-20 LAB — BASIC METABOLIC PANEL
Anion gap: 8 (ref 5–15)
BUN: 26 mg/dL — ABNORMAL HIGH (ref 8–23)
CO2: 27 mmol/L (ref 22–32)
Calcium: 9.2 mg/dL (ref 8.9–10.3)
Chloride: 102 mmol/L (ref 98–111)
Creatinine, Ser: 0.58 mg/dL (ref 0.44–1.00)
GFR, Estimated: >60 mL/min (ref >60)
Glucose, Bld: 126 mg/dL — ABNORMAL HIGH (ref 70–99)
Potassium: 4.3 mmol/L (ref 3.5–5.1)
Sodium: 137 mmol/L (ref 135–145)

## 2022-01-20 LAB — CBC
HCT: 39.3 % (ref 36.0–46.0)
Hemoglobin: 12.9 g/dL (ref 12.0–15.0)
MCH: 33 pg (ref 26.0–34.0)
MCHC: 32.8 g/dL (ref 30.0–36.0)
MCV: 100.5 fL — ABNORMAL HIGH (ref 80.0–100.0)
Platelets: 300 10*3/uL (ref 150–400)
RBC: 3.91 MIL/uL (ref 3.87–5.11)
RDW: 13.4 % (ref 11.5–15.5)
WBC: 8.8 10*3/uL (ref 4.0–10.5)
nRBC: 0 % (ref 0.0–0.2)

## 2022-01-20 MED ORDER — VITAMIN D (ERGOCALCIFEROL) 1.25 MG (50000 UNIT) PO CAPS
50000.0000 [IU] | ORAL_CAPSULE | ORAL | Status: DC
  Administered 2022-01-20 – 2022-01-27 (×2): 50000 [IU] via ORAL
  Filled 2022-01-20 (×2): qty 1

## 2022-01-20 MED ORDER — OXYCODONE HCL ER 15 MG PO T12A
30.0000 mg | EXTENDED_RELEASE_TABLET | Freq: Two times a day (BID) | ORAL | Status: DC
  Administered 2022-01-20 – 2022-01-21 (×3): 30 mg via ORAL
  Filled 2022-01-20 (×3): qty 2

## 2022-01-20 NOTE — Progress Notes (Signed)
Spoke with Varney Biles in Pathology to request Foundation One and PDL-1 testing on tissue from patient's biopsy on 01/06/22 per request from Mikey Bussing, APP.

## 2022-01-20 NOTE — Consult Note (Addendum)
[name]  Telephone:([phone] [phone]   MEDICAL ONCOLOGY - INITIAL CONSULTATION  Referral MD: Dr. June Leap  Reason for Referral: Metastatic non-small cell lung cancer, adenocarcinoma  HPI: [name] is a 66 year old female with a past medical history significant for hypertension, hyperlipidemia, fibromyalgia.  She was sent to the emergency department due to abnormal MRI of the brain.  She initially had a CT of the chest/abdomen/pelvis with contrast performed on 12/26/2021 which showed irregular bronchial wall thickening on the left with continuation to the left lower lobe mass measuring 5.3 x 4.1 x 4.5 cm and diffuse lytic and sclerotic lesions in the bones with possible pathologic fracture at L2.  She was referred to pulmonology and and had a PET scan performed on 01/05/2022 which showed a left lower lobe lung mass intensely FDG avid and compatible with primary bronchogenic carcinoma, left upper lobe pulmonary nodule suspicious for pulmonary metastases, FDG avid ipsilateral hilar, ipsilateral mediastinal, subcarinal, and contralateral right mediastinal node metastasis, widespread FDG avid lytic bone metastasis and lytic lesion within the proximal right femur.  She underwent bronchoscopy on 01/06/2022 and cytology consistent with adenocarcinoma.  An MRI of the brain was performed on 01/08/2022 which showed a thrombus within the straight sinus and no evidence of intracranial metastatic disease.  Neurology was consulted due to the thrombus and recommended subcutaneous Lovenox x5 days followed by Dabigatran 150 mg twice daily.  CT femur without contrast was performed 01/09/2022 which showed diffuse lytic metastases throughout the pelvis and proximal right femur, dominant right distal inferior femoral neck, intertrochanteric and lesser trochanteric lytic lesion.  She was seen by orthopedic surgery who recommended outpatient referral to Ortho oncology at a tertiary care center.   Due to pain, she was seen by radiation oncology and she is now receiving palliative radiation to T11 and L2.  Today, the patient was seen in her hospital room with her husband and family member at the bedside.  She reports significant pain particularly to her left thigh and right hip.  Has not been able to work with physical therapy secondary to pain.  Not really getting out of bed.  Prior to her diagnosis, she reports that she was independent with ADLs.  She had no difficulty ambulating prior to her diagnosis.  She reports that her appetite has been good but she has lost about 10 pounds recently.  She is not having any headaches or dizziness.  Denies chest pain, cough, shortness of breath.  Denies abdominal pain, nausea, vomiting.  The patient is married and has 2 children.  Denies alcohol use.  Smoked 2-1/2 packs of cigarettes per day but quit in 2004.  She smoked for about 20 years.  Family history significant for a mother with breast cancer.  Medical oncology was asked see the patient for recommendations regarding her metastatic non-small cell lung cancer.   Past Medical History:  Diagnosis Date   Complication of anesthesia    "Hard to sedate", per pt and slow to wake up   COVID 10/2020   Degenerative joint disease    s/p cervical and lumbar fusions   Depression    pt denies   Encounter for preventive health examination    Next mammogram in 07/2009. Next colonoscopy in 09/2017. DEXA 4/09>Normal   Encounter for screening colonoscopy 10/03/2007   Normal   Fatty liver    Fibromyalgia    GERD (gastroesophageal reflux disease)    Hemorrhoid    hx of   History of hiatal  hernia    Hyperlipidemia    Hypertension    Sciatica   :   Past Surgical History:  Procedure Laterality Date   ABDOMINAL HYSTERECTOMY N/A    [employer] 11/21/2019   APPENDECTOMY N/A    [employer] 11/21/2019   BRONCHIAL NEEDLE ASPIRATION BIOPSY  01/06/2022   Procedure: BRONCHIAL NEEDLE ASPIRATION BIOPSIES;  Surgeon: Garner Nash, DO;  Location: [facility];  Service: Pulmonary;;   CERVICAL FUSION  2010   X 2   LUMBAR FUSION  1997   X 2/has plate and screws and cage   OVARIAN CYST SURGERY  1980   pelvic prolapse  1987   RECTAL PROLAPSE REPAIR     SPINE SURGERY N/A    [employer] 11/21/2019   TONSILLECTOMY     and adenoids   TOTAL ABDOMINAL HYSTERECTOMY  1987   VIDEO BRONCHOSCOPY WITH ENDOBRONCHIAL ULTRASOUND Left 01/06/2022   Procedure: VIDEO BRONCHOSCOPY WITH ENDOBRONCHIAL ULTRASOUND;  Surgeon: Garner Nash, DO;  Location: [facility];  Service: Pulmonary;  Laterality: Left;  :   Current Facility-Administered Medications  Medication Dose Route Frequency Provider Last Rate Last Admin   acetaminophen (TYLENOL) tablet 650 mg  650 mg Oral Q6H PRN Amin, Ankit Chirag, MD   650 mg at 01/14/22 1458   alum & mag hydroxide-simeth (MAALOX/MYLANTA) 200-200-20 MG/5ML suspension 15 mL  15 mL Oral Q6H PRN Amin, Ankit Chirag, MD   15 mL at 01/19/22 1651   apixaban (ELIQUIS) tablet 10 mg  10 mg Oral BID Randa Spike, RPH   10 mg at 01/20/22 4562   Followed by   Derrill Memo ON 01/25/2022] apixaban (ELIQUIS) tablet 5 mg  5 mg Oral BID Shade, Christine E, RPH       bisacodyl (DULCOLAX) suppository 10 mg  10 mg Rectal Daily PRN Pershing Proud, NP       Chlorhexidine Gluconate Cloth 2 % PADS 6 each  6 each Topical Daily Damita Lack, MD   6 each at 01/20/22 0838   dexamethasone (DECADRON) tablet 4 mg  4 mg Oral Daily Amin, Ankit Chirag, MD   4 mg at 01/20/22 0838   DULoxetine (CYMBALTA) DR capsule 20 mg  20 mg Oral Daily Vinie Sill C, NP   20 mg at 01/20/22 0840   ezetimibe (ZETIA) tablet 10 mg  10 mg Oral Daily Rise Patience, MD   10 mg at 01/20/22 0837   feeding supplement (KATE FARMS STANDARD 1.4) liquid 325 mL  325 mL Oral BID BM Amin, Ankit Chirag, MD   325 mL at 01/20/22 0836   guaiFENesin (ROBITUSSIN) 100 MG/5ML liquid 5 mL  5 mL Oral Q4H PRN Amin, Ankit Chirag, MD       hydrALAZINE  (APRESOLINE) injection 10 mg  10 mg Intravenous Q4H PRN Amin, Ankit Chirag, MD       hydrochlorothiazide (HYDRODIURIL) tablet 12.5 mg  12.5 mg Oral Daily Rise Patience, MD   12.5 mg at 01/20/22 5638   ipratropium-albuterol (DUONEB) 0.5-2.5 (3) MG/3ML nebulizer solution 3 mL  3 mL Nebulization Q4H PRN Amin, Ankit Chirag, MD       methocarbamol (ROBAXIN) tablet 500 mg  500 mg Oral L3T Vinie Sill C, NP   342 mg at 01/20/22 0544   metoprolol tartrate (LOPRESSOR) injection 5 mg  5 mg Intravenous Q4H PRN Amin, Ankit Chirag, MD       ondansetron (ZOFRAN) tablet 8 mg  8 mg Oral Q8H PRN Vanna Scotland, MD   8 mg  at 01/09/22 1527   Oral care mouth rinse  15 mL Mouth Rinse PRN Amin, Ankit Chirag, MD       oxyCODONE (Oxy IR/ROXICODONE) immediate release tablet 20 mg  20 mg Oral E9B PRN Pershing Proud, NP   20 mg at 01/20/22 0837   oxyCODONE (OXYCONTIN) 12 hr tablet 30 mg  30 mg Oral Q12H Ferolito, Freada Bergeron, NP       pantoprazole (PROTONIX) EC tablet 40 mg  40 mg Oral Daily Amin, Ankit Chirag, MD   40 mg at 01/20/22 0837   polyethylene glycol (MIRALAX / GLYCOLAX) packet 17 g  17 g Oral Daily Shelly Coss, MD   17 g at 01/20/22 0836   pregabalin (LYRICA) capsule 75 mg  75 mg Oral Daily Pershing Proud, NP   75 mg at 01/20/22 2841   senna-docusate (Senokot-S) tablet 2 tablet  2 tablet Oral BID Pershing Proud, NP   2 tablet at 01/20/22 0837   tamsulosin (FLOMAX) capsule 0.4 mg  0.4 mg Oral QPC breakfast Amin, Ankit Chirag, MD   0.4 mg at 01/20/22 3244   traZODone (DESYREL) tablet 50 mg  50 mg Oral QHS PRN Damita Lack, MD   50 mg at 01/19/22 2134      Allergies  Allergen Reactions   Codeine     Nausea and vomiting, hallucinations, bad dreams    Morphine Shortness Of Breath    B/P dropped!   Beef-Derived Products Nausea And Vomiting    Alpha Gal   Doxycycline Nausea And Vomiting   Ibuprofen     Stomach Ulcers, stomach scarred    Iodinated Contrast Media Nausea And Vomiting    Pt  reports n/v only. No other reaction noted. No need for 13 hr prep.    Latex Rash   Nsaids Other (See Comments)    PT has hx of ulcers.   Penicillins Nausea And Vomiting    hives   Pork-Derived Products Nausea And Vomiting   Tramadol     Caused RLS   Gabapentin Other (See Comments)    Pt states this made her feel like she was always depressed and not caring about anything  :   Family History  Problem Relation Age of Onset   Cancer Mother    Diabetes Mother    Breast cancer Mother    Heart attack Mother    Hyperlipidemia Mother    Hyperlipidemia Father    Hypertension Father    Aneurysm Father    Dementia Maternal Grandfather    Cancer Paternal Grandmother        intestinal  :   Social History   Socioeconomic History   Marital status: Married    Spouse name: Not on file   Number of children: Not on file   Years of education: Not on file   Highest education level: Not on file  Occupational History   Not on file  Tobacco Use   Smoking status: Former    Packs/day: 2.00    Years: 30.00    Total pack years: 60.00    Types: Cigarettes    Quit date: 04/03/2003    Years since quitting: 18.8   Smokeless tobacco: Never  Vaping Use   Vaping Use: Never used  Substance and Sexual Activity   Alcohol use: Not Currently    Alcohol/week: 0.0 standard drinks of alcohol   Drug use: No   Sexual activity: Not Currently  Other Topics Concern   Not on file  Social History Narrative   Not on file   Social Determinants of Health   Financial Resource Strain: Medium Risk (03/02/2021)   Overall Financial Resource Strain (CARDIA)    Difficulty of Paying Living Expenses: Somewhat hard  Food Insecurity: No Food Insecurity (03/02/2021)   Hunger Vital Sign    Worried About Running Out of Food in the Last Year: Never true    Ran Out of Food in the Last Year: Never true  Transportation Needs: No Transportation Needs (03/02/2021)   PRAPARE - Hydrologist  (Medical): No    Lack of Transportation (Non-Medical): No  Physical Activity: Inactive (03/02/2021)   Exercise Vital Sign    Days of Exercise per Week: 0 days    Minutes of Exercise per Session: 0 min  Stress: Stress Concern Present (03/02/2021)   [place]    Feeling of Stress : To some extent  Social Connections: Moderately Integrated (03/02/2021)   Social Connection and Isolation Panel [NHANES]    Frequency of Communication with Friends and Family: More than three times a week    Frequency of Social Gatherings with Friends and Family: More than three times a week    Attends Religious Services: More than 4 times per year    Active Member of [employer] or Organizations: No    Attends Archivist Meetings: Never    Marital Status: Married  Human resources officer Violence: Not At Risk (03/02/2021)   Humiliation, Afraid, Rape, and Kick questionnaire    Fear of Current or Ex-Partner: No    Emotionally Abused: No    Physically Abused: No    Sexually Abused: No  :  Review of Systems: A comprehensive 14 point review of systems was negative except as noted in the HPI.  Exam: Patient Vitals for the past 24 hrs:  BP Temp Temp src Pulse Resp SpO2 Weight  01/20/22 0553 119/70 98.1 F (36.7 C) Oral 83 18 94 % --  01/19/22 2237 135/67 98.1 F (36.7 C) Oral 85 20 94 % --  01/19/22 1505 132/66 98.3 F (36.8 C) Oral 90 20 92 % --  01/19/22 1431 -- -- -- -- -- -- 88.8 kg    General: Laying in bed, no distress. Eyes:  no scleral icterus.   ENT:  There were no oropharyngeal lesions.    Lymphatics:  Negative cervical, supraclavicular or axillary adenopathy.   Respiratory: lungs were clear bilaterally without wheezing or crackles.   Cardiovascular:  Regular rate and rhythm, S1/S2, without murmur, rub or gallop.  There was no pedal edema.   GI:  abdomen was soft, flat, nontender, nondistended, without organomegaly.    Skin: No  rashes.  She has a few scattered ecchymoses on her arms. Neuro exam was nonfocal. Patient was alert and oriented.     Lab Results  Component Value Date   WBC 8.8 01/20/2022   HGB 12.9 01/20/2022   HCT 39.3 01/20/2022   PLT 300 01/20/2022   GLUCOSE 126 (H) 01/20/2022   CHOL 165 01/10/2022   TRIG 210 (H) 01/10/2022   HDL 36 (L) 01/10/2022   LDLCALC 87 01/10/2022   ALT 21 07/07/2021   AST 34 07/07/2021   NA 137 01/20/2022   K 4.3 01/20/2022   CL 102 01/20/2022   CREATININE 0.58 01/20/2022   BUN 26 (H) 01/20/2022   CO2 27 01/20/2022   .    Latest Ref Rng & Units 01/20/2022  8:05 AM 01/15/2022    5:38 AM 01/14/2022    5:43 AM  CBC  WBC 4.0 - 10.5 K/uL 8.8  8.4  8.8   Hemoglobin 12.0 - 15.0 g/dL 12.9  13.3  13.0   Hematocrit 36.0 - 46.0 % 39.3  40.2  38.7   Platelets 150 - 400 K/uL 300  303  287       Latest Ref Rng & Units 01/20/2022    8:05 AM 01/16/2022    7:03 AM 01/15/2022    5:38 AM  CMP  Glucose 70 - 99 mg/dL 126  139  143   BUN 8 - 23 mg/dL _0 Creatinine 0.44 - 1.00 mg/dL 0.58  0.52  0.47   Sodium 135 - 145 mmol/L 137  138  138   Potassium 3.5 - 5.1 mmol/L 4.3  4.1  3.9   Chloride 98 - 111 mmol/L 102  102  103   CO2 22 - 32 mmol/L _1 Calcium 8.9 - 10.3 mg/dL 9.2  9.3  9.2    CYTOLOGY - NON PAP  CASE: [id number]  PATIENT: Dene Shain  Non-Gynecological Cytology Report      Clinical History: Lung mass, adenopathy     FINAL MICROSCOPIC DIAGNOSIS:   A. LYMPH NODE, STATION 7, FINE NEEDLE ASPIRATION:  - Malignant cells consistent with adenocarcinoma, see comment       COMMENT:   Immunohistochemical stains show that the tumor cells are negative for  TTF-1 and cytokeratin AE1/AE3 (membranous staining); while they are  negative for synaptophysin, CD56, p63 and CK5/6.  This immunoprofile is  consistent with above interpretation.  Dr. Arby Barrette reviewed the case  and concurs with the above diagnosis.  Dr. Valeta Harms was notified on   01/09/2022.   CT FEMUR LEFT WO CONTRAST  Result Date: 01/09/2022 CLINICAL DATA:  Metastatic disease evaluation. Bilateral femur. Left leg pain. Primary bronchogenic carcinoma. EXAM: CT OF THE LOWER LEFT EXTREMITY WITHOUT CONTRAST TECHNIQUE: Multidetector CT imaging of the lower left extremity was performed according to the standard protocol. RADIATION DOSE REDUCTION: This exam was performed according to the departmental dose-optimization program which includes automated exposure control, adjustment of the mA and/or kV according to patient size and/or use of iterative reconstruction technique. COMPARISON:  PET-CT 01/05/2022 FINDINGS: Bones/Joint/Cartilage Note is made on recent PET-CT of numerous hypermetabolic lytic bone metastases within the axial and appendicular skeleton. On the current CT, there are numerous lytic lesions seen throughout the visualized pelvis. A reference predominantly lytic lesion within the posterior left ilium was more completely seen on recent PET-CT and measures up to approximately 2.5 x 5.0 cm in greatest transverse by AP dimensions (axial series 3, image 1). There is high-grade thinning of the posteromedial iliac cortex (axial image 1). Superior lateral left acetabular subcortical lytic lesion measures up to approximately 1.1 x 1.8 x 1.9 cm (transverse by AP by craniocaudal) with abutment of the lateral cortex but no definitive breakthrough (axial series 2, image 32 and coronal series 7, image 56). There areas of patchy lytic lucency sclerosis within the left femoral head suspicious for metastatic disease involvement. Within the limitations of diffuse decreased bone mineralization, no definitive destructive lytic lesion is seen within the left intertrochanteric more distal left femur. Moderate medial compartment of the knee joint space narrowing with subchondral sclerosis and tiny peripheral medial ossicle degenerative change. Ligaments Suboptimally assessed by CT. Muscles and Tendons  Mild fatty infiltration of the proximal anterior  left gluteus minimus muscle body. No gross tendon tear is seen within the visualized left thigh. Soft tissues No knee joint effusion.  No soft tissue fluid collection. IMPRESSION: 1. Numerous lytic metastases within the visualized pelvis as on prior PET-CT. 2. Dominant partially visualized posterior left iliac lesion that was hypermetabolic on prior PET-CT measures up to approximately 2.5 x 5.0 cm in greatest transverse by AP dimensions. This causes high-grade thinning of the posteromedial iliac cortex. 3. Smaller superolateral left acetabular 1.9 cm lytic lesion abuts the lateral cortex without definite cortical breakthrough. Electronically Signed   By: Yvonne Kendall M.D.   On: 01/09/2022 13:51   CT FEMUR RIGHT WO CONTRAST  Result Date: 01/09/2022 CLINICAL DATA:  Metastatic disease evaluation. Bilateral femur. Left leg pain. Primary bronchogenic carcinoma. EXAM: CT OF THE LOWER RIGHT EXTREMITY WITHOUT CONTRAST TECHNIQUE: Multidetector CT imaging of the right lower extremity was performed according to the standard protocol. RADIATION DOSE REDUCTION: This exam was performed according to the departmental dose-optimization program which includes automated exposure control, adjustment of the mA and/or kV according to patient size and/or use of iterative reconstruction technique. COMPARISON:  PET-CT through the bilateral proximal femurs 01/05/2022 FINDINGS: Bones/Joint/Cartilage There again diffuse lytic metastases, seen on prior recent PET-CT to be hypermetabolic and to involve the appendicular axial skeleton. Reference lytic lesions include right iliac 1.7 x 1.3 cm lytic lesion (axial series 3, image 8) causing thinning of the anterior iliac cortex (greatest on axial series 2, image 6 where there is likely minimal pathologic breakthrough of the cortex). Right distal femoral neck and intertrochanteric to lesser trochanteric region of the right femur lucent lesion  measuring up to approximately 4.1 by 2.8 by 5.1 cm (transverse by AP by craniocaudal, axial series 2, image 92 and coronal series 7, image 76). This thins the posterior distal femoral neck and the posterior lesser trochanteric cortex (axial series 3, images 84 through 91 with areas of complete cortical breakthrough. No definitive metastatic lesion is seen within the more distal right femur, however diffuse decreased bone mineralization does limit sensitivity. Ligaments Suboptimally assessed by CT. Muscles and Tendons Normal density in size of the region of the right thigh musculature. No gross tendon tear is seen. Soft tissues No soft tissue fluid collection is seen. Mild atherosclerotic calcifications are partially visualized within the pelvis. IMPRESSION: 1. Diffuse lytic metastases are again seen throughout the pelvis and proximal right femur. 2. Dominant right distal inferior femoral neck, intertrochanteric and lesser trochanteric lytic lesion again thins the medial cortex and appears to minimally breakthrough the posterior lesser trochanteric cortex. This increases susceptibility for risk of pathologic fracture of the Mirels classification system given lower limb location, trochanteric region location, involving greater than 2/3 of the transverse bone diameter, and lytic quality. Recommend clinical evaluation for possible functional pain to complete this Mirels classification assessment. Electronically Signed   By: Yvonne Kendall M.D.   On: 01/09/2022 13:41   CT Head Wo Contrast  Result Date: 01/08/2022 CLINICAL DATA:  Frontal headache, dural venous sinus thrombosis suspected EXAM: CT HEAD WITHOUT CONTRAST TECHNIQUE: Contiguous axial images were obtained from the base of the skull through the vertex without intravenous contrast. RADIATION DOSE REDUCTION: This exam was performed according to the departmental dose-optimization program which includes automated exposure control, adjustment of the mA and/or kV  according to patient size and/or use of iterative reconstruction technique. COMPARISON:  MRI earlier today FINDINGS: Brain: No intracranial hemorrhage, mass effect, or evidence of acute infarct. No hydrocephalus. No extra-axial fluid  collection. Vascular: Hyperdensity within the straight sinus (series 7/image 29) compatible with thrombosis as seen on MRI earlier today. Skull: No fracture or focal lesion. Sinuses/Orbits: No acute finding. Paranasal sinuses and mastoid air cells are well aerated. Other: None. IMPRESSION: Hyperdensity in the straight sinus compatible with thrombus as seen on MRI earlier today. No evidence of hemorrhage or acute infarct. Electronically Signed   By: Placido Sou M.D.   On: 01/08/2022 18:51   MR BRAIN W WO CONTRAST  Result Date: 01/08/2022 CLINICAL DATA:  Non-small cell lung cancer (NSCLC), staging EXAM: MRI HEAD WITHOUT AND WITH CONTRAST TECHNIQUE: Multiplanar, multiecho pulse sequences of the brain and surrounding structures were obtained without and with intravenous contrast. CONTRAST:  108m GADAVIST GADOBUTROL 1 MMOL/ML IV SOLN COMPARISON:  None Available. FINDINGS: Brain: There is no acute infarction or intracranial hemorrhage. There is no intracranial mass, mass effect, or edema. There is no hydrocephalus or extra-axial fluid collection. Ventricles and sulci are within normal limits in size and configuration. Minimal punctate foci of T2 hyperintensity in the supratentorial white matter nonspecific but may reflect minor chronic microvascular ischemic changes. No abnormal enhancement. Vascular: There is thrombus within the proximal to mid straight sinus. Remainder of dural venous sinuses are patent. Major vessel flow voids at the skull base are preserved. Skull and upper cervical spine: Normal marrow signal is preserved. Partially imaged susceptibility artifact related to cervical spine fusion. Sinuses/Orbits: Paranasal sinuses are aerated. Orbits are unremarkable. Other: Sella  is unremarkable.  Mastoid air cells are clear. IMPRESSION: Thrombus within the straight sinus. No evidence of intracranial metastatic disease. These results will be called to the ordering clinician or representative by the Radiologist Assistant, and communication documented in the PACS or [employer] Electronically Signed   By: PMacy MisM.D.   On: 01/08/2022 11:55   NM PET Image Initial (PI) Skull Base To Thigh (F-18 FDG)  Result Date: 01/05/2022 CLINICAL DATA:  Initial treatment strategy for non-small cell lung cancer. EXAM: NUCLEAR MEDICINE PET SKULL BASE TO THIGH TECHNIQUE: 8.74 mCi F-18 FDG was injected intravenously. Full-ring PET imaging was performed from the skull base to thigh after the radiotracer. CT data was obtained and used for attenuation correction and anatomic localization. Fasting blood glucose: 136 mg/dl COMPARISON:  CT 12/26/2021 FINDINGS: Mediastinal blood pool activity: SUV max 3.25 Liver activity: SUV max NA NECK: No hypermetabolic lymph nodes in the neck. Incidental CT findings: none CHEST: There is a mass within the superior segment of the left lower lobe which measures 5.1 x 3.7 cm within SUV max of 18.24, image 37/7. Left upper lobe pulmonary nodule measures 8 mm and has an SUV max 3.82, image 28/7. Part solid nodule within the apical segment of the left upper lobe measures 2.3 cm with a 4 mm peripheral solid component, image 12/7. This has an SUV max 1.98. FDG avid left hilar, AP window, subcarinal, and right paratracheal lymph nodes identified. The index right paratracheal lymph node measures 9 mm with SUV max of 10.11, image 68/4. The subcarinal lymph node measures 8 mm with SUV max of 12.37, image 72/4. Left hilar lymph node has an SUV max 11.44. Incidental CT findings: Aortic atherosclerosis. No pericardial effusion. No pleural effusion. ABDOMEN/PELVIS: There is no abnormal FDG uptake within the liver, pancreas, or spleen. Mild asymmetric increased uptake within the  left adrenal gland corresponds to a tiny nodule measuring 6 mm with SUV max of 5.31, image 111/4. No tracer avid abdominopelvic lymph nodes identified. Incidental CT findings: Aortic  atherosclerosis. Moderate size hiatal hernia identified. SKELETON: Extensive tracer avid lytic bone metastases are identified involving the proximal appendicular and axial skeleton. Lesions are too numerous to count. Of potential orthopedic significance is a large tracer avid lytic lesion involving the proximal right femur and lesser trochanter with SUV max of 13.11, image 188/4. Lytic lesion involving the posterior aspect of the left iliac bone measures 4 cm with SUV max of 14.58, image 158/4. extensive bilateral ribs and spinal metastases are noted. Metastatic lesion with pathologic compression deformity is noted L2 vertebral body with SUV max 11.93. Here, tumor extends into the left posterior elements with mass effect upon the L1-2 and L2-3 neural foramina. Incidental CT findings: none IMPRESSION: 1. Left lower lobe lung mass is intensely FDG avid compatible with primary bronchogenic carcinoma. 2. Left upper lobe pulmonary nodule suspicious for pulmonary metastasis. 3. FDG avid ipsilateral hilar, ipsilateral mediastinal, subcarinal, and contralateral right mediastinal nodal metastasis. 4. Widespread FDG avid lytic bone metastasis. Lytic lesion within the proximal right femur may be of orthopedic significance. 5. Part solid nodule with low level FDG uptake is identified within the apical segment of the left upper lobe. Cannot exclude indolent pulmonary neoplasm such as adenocarcinoma. 6. Tiny nodule in the left adrenal gland is FDG avid. Early left adrenal metastasis cannot be excluded. Electronically Signed   By: Kerby Moors M.D.   On: 01/05/2022 16:21   CT Abdomen Pelvis W Contrast  Result Date: 12/28/2021 CLINICAL DATA:  Metastatic disease evaluation, abnormal finding on MRI. Initial staging. EXAM: CT CHEST, ABDOMEN, AND  PELVIS WITH CONTRAST TECHNIQUE: Multidetector CT imaging of the chest, abdomen and pelvis was performed following the standard protocol during bolus administration of intravenous contrast. RADIATION DOSE REDUCTION: This exam was performed according to the departmental dose-optimization program which includes automated exposure control, adjustment of the mA and/or kV according to patient size and/or use of iterative reconstruction technique. CONTRAST:  147m ISOVUE-300 IOPAMIDOL (ISOVUE-300) INJECTION 61% COMPARISON:  02/09/2010, 12/17/2021. FINDINGS: CT CHEST FINDINGS Cardiovascular: The heart is normal in size and there is no pericardial effusion. There is atherosclerotic calcification of the aorta without evidence of aneurysm. The pulmonary trunk is normal in caliber. Mediastinum/Nodes: A prominent lymph node is noted in the right paratracheal space measuring 9 mm. No axillary lymphadenopathy. The thyroid gland, trachea, and esophagus are within normal limits. A small hiatal hernia is noted. Lungs/Pleura: Emphysematous changes are present in the lungs. Irregular soft tissue thickening is noted in the hilar region on the left which is continuous with a left lower lobe mass measuring 5.3 x 4.1 x 4.5 cm, axial image 74. There is a spiculated nodule in the posterior segment of the right upper lobe measuring 9 mm, axial image 55. There is a subsolid nodular opacity in the apical segment of the left upper lobe measuring 2.9 cm, axial image 24. Scattered hazy ground-glass and nodular ground-glass opacities are noted in the right lung measuring up to 1.1 cm, axial image 61. There is a irregular subsolid nodular opacity in the right lower lobe measuring 9 mm, axial image 109. There is a trace left pleural effusion with subsegmental atelectasis in the left lower lobe and lingular segment of the left upper lobe. Musculoskeletal: Cervical spinal fusion hardware is noted. Multiple mixed lytic and sclerotic lesions are noted  within the bones, compatible with metastatic disease. No acute fracture. CT ABDOMEN PELVIS FINDINGS Hepatobiliary: No focal abnormality. Fatty infiltration of the liver is noted. No biliary ductal dilatation. The gallbladder is without  stones. Pancreas: Unremarkable. No pancreatic ductal dilatation or surrounding inflammatory changes. Spleen: Normal in size without focal abnormality. Adrenals/Urinary Tract: No adrenal nodule or mass. The kidneys enhance symmetrically. No renal calculus or hydronephrosis. The bladder is unremarkable. Stomach/Bowel: Stomach is within normal limits. Appendix appears normal. No evidence of bowel wall thickening, distention, or inflammatory changes. No free air or pneumatosis. Vascular/Lymphatic: Aortic atherosclerosis. No enlarged abdominal or pelvic lymph nodes. Reproductive: Status post hysterectomy. No adnexal masses. Other: No abdominopelvic ascites. Musculoskeletal: Diffuse lytic and sclerotic lesions are noted in the bones, suggesting metastatic disease. Lumbar spinal fusion hardware is noted from L4-S1. A stable compression deformity is present at L2, possible pathologic fracture. IMPRESSION: 1. Irregular bronchial wall thickening on the left with continuation to left lower lobe mass measuring 5.3 x 4.1 x 4.5 cm, primary neoplasm versus metastatic disease. Additional spiculated, subsolid, and ground-glass opacities are noted in the lungs bilaterally, concerning for metastasis. PET-CT and/or tissue sampling is recommended for definitive diagnosis. 2. Diffuse lytic and sclerotic lesions in the bones with possible pathologic fracture at L2, suspicious for metastatic disease. 3. Hepatic steatosis. No focal abnormality is identified in the liver. 4. Small hiatal hernia. 5. Aortic atherosclerosis. Electronically Signed   By: Brett Fairy M.D.   On: 12/28/2021 21:23   CT Chest W Contrast  Result Date: 12/28/2021 CLINICAL DATA:  Metastatic disease evaluation, abnormal finding on  MRI. Initial staging. EXAM: CT CHEST, ABDOMEN, AND PELVIS WITH CONTRAST TECHNIQUE: Multidetector CT imaging of the chest, abdomen and pelvis was performed following the standard protocol during bolus administration of intravenous contrast. RADIATION DOSE REDUCTION: This exam was performed according to the departmental dose-optimization program which includes automated exposure control, adjustment of the mA and/or kV according to patient size and/or use of iterative reconstruction technique. CONTRAST:  131m ISOVUE-300 IOPAMIDOL (ISOVUE-300) INJECTION 61% COMPARISON:  02/09/2010, 12/17/2021. FINDINGS: CT CHEST FINDINGS Cardiovascular: The heart is normal in size and there is no pericardial effusion. There is atherosclerotic calcification of the aorta without evidence of aneurysm. The pulmonary trunk is normal in caliber. Mediastinum/Nodes: A prominent lymph node is noted in the right paratracheal space measuring 9 mm. No axillary lymphadenopathy. The thyroid gland, trachea, and esophagus are within normal limits. A small hiatal hernia is noted. Lungs/Pleura: Emphysematous changes are present in the lungs. Irregular soft tissue thickening is noted in the hilar region on the left which is continuous with a left lower lobe mass measuring 5.3 x 4.1 x 4.5 cm, axial image 74. There is a spiculated nodule in the posterior segment of the right upper lobe measuring 9 mm, axial image 55. There is a subsolid nodular opacity in the apical segment of the left upper lobe measuring 2.9 cm, axial image 24. Scattered hazy ground-glass and nodular ground-glass opacities are noted in the right lung measuring up to 1.1 cm, axial image 61. There is a irregular subsolid nodular opacity in the right lower lobe measuring 9 mm, axial image 109. There is a trace left pleural effusion with subsegmental atelectasis in the left lower lobe and lingular segment of the left upper lobe. Musculoskeletal: Cervical spinal fusion hardware is noted.  Multiple mixed lytic and sclerotic lesions are noted within the bones, compatible with metastatic disease. No acute fracture. CT ABDOMEN PELVIS FINDINGS Hepatobiliary: No focal abnormality. Fatty infiltration of the liver is noted. No biliary ductal dilatation. The gallbladder is without stones. Pancreas: Unremarkable. No pancreatic ductal dilatation or surrounding inflammatory changes. Spleen: Normal in size without focal abnormality. Adrenals/Urinary Tract: No  adrenal nodule or mass. The kidneys enhance symmetrically. No renal calculus or hydronephrosis. The bladder is unremarkable. Stomach/Bowel: Stomach is within normal limits. Appendix appears normal. No evidence of bowel wall thickening, distention, or inflammatory changes. No free air or pneumatosis. Vascular/Lymphatic: Aortic atherosclerosis. No enlarged abdominal or pelvic lymph nodes. Reproductive: Status post hysterectomy. No adnexal masses. Other: No abdominopelvic ascites. Musculoskeletal: Diffuse lytic and sclerotic lesions are noted in the bones, suggesting metastatic disease. Lumbar spinal fusion hardware is noted from L4-S1. A stable compression deformity is present at L2, possible pathologic fracture. IMPRESSION: 1. Irregular bronchial wall thickening on the left with continuation to left lower lobe mass measuring 5.3 x 4.1 x 4.5 cm, primary neoplasm versus metastatic disease. Additional spiculated, subsolid, and ground-glass opacities are noted in the lungs bilaterally, concerning for metastasis. PET-CT and/or tissue sampling is recommended for definitive diagnosis. 2. Diffuse lytic and sclerotic lesions in the bones with possible pathologic fracture at L2, suspicious for metastatic disease. 3. Hepatic steatosis. No focal abnormality is identified in the liver. 4. Small hiatal hernia. 5. Aortic atherosclerosis. Electronically Signed   By: Brett Fairy M.D.   On: 12/28/2021 21:23     CT FEMUR LEFT WO CONTRAST  Result Date: 01/09/2022 CLINICAL  DATA:  Metastatic disease evaluation. Bilateral femur. Left leg pain. Primary bronchogenic carcinoma. EXAM: CT OF THE LOWER LEFT EXTREMITY WITHOUT CONTRAST TECHNIQUE: Multidetector CT imaging of the lower left extremity was performed according to the standard protocol. RADIATION DOSE REDUCTION: This exam was performed according to the departmental dose-optimization program which includes automated exposure control, adjustment of the mA and/or kV according to patient size and/or use of iterative reconstruction technique. COMPARISON:  PET-CT 01/05/2022 FINDINGS: Bones/Joint/Cartilage Note is made on recent PET-CT of numerous hypermetabolic lytic bone metastases within the axial and appendicular skeleton. On the current CT, there are numerous lytic lesions seen throughout the visualized pelvis. A reference predominantly lytic lesion within the posterior left ilium was more completely seen on recent PET-CT and measures up to approximately 2.5 x 5.0 cm in greatest transverse by AP dimensions (axial series 3, image 1). There is high-grade thinning of the posteromedial iliac cortex (axial image 1). Superior lateral left acetabular subcortical lytic lesion measures up to approximately 1.1 x 1.8 x 1.9 cm (transverse by AP by craniocaudal) with abutment of the lateral cortex but no definitive breakthrough (axial series 2, image 32 and coronal series 7, image 56). There areas of patchy lytic lucency sclerosis within the left femoral head suspicious for metastatic disease involvement. Within the limitations of diffuse decreased bone mineralization, no definitive destructive lytic lesion is seen within the left intertrochanteric more distal left femur. Moderate medial compartment of the knee joint space narrowing with subchondral sclerosis and tiny peripheral medial ossicle degenerative change. Ligaments Suboptimally assessed by CT. Muscles and Tendons Mild fatty infiltration of the proximal anterior left gluteus minimus muscle  body. No gross tendon tear is seen within the visualized left thigh. Soft tissues No knee joint effusion.  No soft tissue fluid collection. IMPRESSION: 1. Numerous lytic metastases within the visualized pelvis as on prior PET-CT. 2. Dominant partially visualized posterior left iliac lesion that was hypermetabolic on prior PET-CT measures up to approximately 2.5 x 5.0 cm in greatest transverse by AP dimensions. This causes high-grade thinning of the posteromedial iliac cortex. 3. Smaller superolateral left acetabular 1.9 cm lytic lesion abuts the lateral cortex without definite cortical breakthrough. Electronically Signed   By: Yvonne Kendall M.D.   On: 01/09/2022 13:51  CT FEMUR RIGHT WO CONTRAST  Result Date: 01/09/2022 CLINICAL DATA:  Metastatic disease evaluation. Bilateral femur. Left leg pain. Primary bronchogenic carcinoma. EXAM: CT OF THE LOWER RIGHT EXTREMITY WITHOUT CONTRAST TECHNIQUE: Multidetector CT imaging of the right lower extremity was performed according to the standard protocol. RADIATION DOSE REDUCTION: This exam was performed according to the departmental dose-optimization program which includes automated exposure control, adjustment of the mA and/or kV according to patient size and/or use of iterative reconstruction technique. COMPARISON:  PET-CT through the bilateral proximal femurs 01/05/2022 FINDINGS: Bones/Joint/Cartilage There again diffuse lytic metastases, seen on prior recent PET-CT to be hypermetabolic and to involve the appendicular axial skeleton. Reference lytic lesions include right iliac 1.7 x 1.3 cm lytic lesion (axial series 3, image 8) causing thinning of the anterior iliac cortex (greatest on axial series 2, image 6 where there is likely minimal pathologic breakthrough of the cortex). Right distal femoral neck and intertrochanteric to lesser trochanteric region of the right femur lucent lesion measuring up to approximately 4.1 by 2.8 by 5.1 cm (transverse by AP by  craniocaudal, axial series 2, image 92 and coronal series 7, image 76). This thins the posterior distal femoral neck and the posterior lesser trochanteric cortex (axial series 3, images 84 through 91 with areas of complete cortical breakthrough. No definitive metastatic lesion is seen within the more distal right femur, however diffuse decreased bone mineralization does limit sensitivity. Ligaments Suboptimally assessed by CT. Muscles and Tendons Normal density in size of the region of the right thigh musculature. No gross tendon tear is seen. Soft tissues No soft tissue fluid collection is seen. Mild atherosclerotic calcifications are partially visualized within the pelvis. IMPRESSION: 1. Diffuse lytic metastases are again seen throughout the pelvis and proximal right femur. 2. Dominant right distal inferior femoral neck, intertrochanteric and lesser trochanteric lytic lesion again thins the medial cortex and appears to minimally breakthrough the posterior lesser trochanteric cortex. This increases susceptibility for risk of pathologic fracture of the Mirels classification system given lower limb location, trochanteric region location, involving greater than 2/3 of the transverse bone diameter, and lytic quality. Recommend clinical evaluation for possible functional pain to complete this Mirels classification assessment. Electronically Signed   By: Yvonne Kendall M.D.   On: 01/09/2022 13:41   CT Head Wo Contrast  Result Date: 01/08/2022 CLINICAL DATA:  Frontal headache, dural venous sinus thrombosis suspected EXAM: CT HEAD WITHOUT CONTRAST TECHNIQUE: Contiguous axial images were obtained from the base of the skull through the vertex without intravenous contrast. RADIATION DOSE REDUCTION: This exam was performed according to the departmental dose-optimization program which includes automated exposure control, adjustment of the mA and/or kV according to patient size and/or use of iterative reconstruction technique.  COMPARISON:  MRI earlier today FINDINGS: Brain: No intracranial hemorrhage, mass effect, or evidence of acute infarct. No hydrocephalus. No extra-axial fluid collection. Vascular: Hyperdensity within the straight sinus (series 7/image 29) compatible with thrombosis as seen on MRI earlier today. Skull: No fracture or focal lesion. Sinuses/Orbits: No acute finding. Paranasal sinuses and mastoid air cells are well aerated. Other: None. IMPRESSION: Hyperdensity in the straight sinus compatible with thrombus as seen on MRI earlier today. No evidence of hemorrhage or acute infarct. Electronically Signed   By: Placido Sou M.D.   On: 01/08/2022 18:51   MR BRAIN W WO CONTRAST  Result Date: 01/08/2022 CLINICAL DATA:  Non-small cell lung cancer (NSCLC), staging EXAM: MRI HEAD WITHOUT AND WITH CONTRAST TECHNIQUE: Multiplanar, multiecho pulse sequences of the brain  and surrounding structures were obtained without and with intravenous contrast. CONTRAST:  36m GADAVIST GADOBUTROL 1 MMOL/ML IV SOLN COMPARISON:  None Available. FINDINGS: Brain: There is no acute infarction or intracranial hemorrhage. There is no intracranial mass, mass effect, or edema. There is no hydrocephalus or extra-axial fluid collection. Ventricles and sulci are within normal limits in size and configuration. Minimal punctate foci of T2 hyperintensity in the supratentorial white matter nonspecific but may reflect minor chronic microvascular ischemic changes. No abnormal enhancement. Vascular: There is thrombus within the proximal to mid straight sinus. Remainder of dural venous sinuses are patent. Major vessel flow voids at the skull base are preserved. Skull and upper cervical spine: Normal marrow signal is preserved. Partially imaged susceptibility artifact related to cervical spine fusion. Sinuses/Orbits: Paranasal sinuses are aerated. Orbits are unremarkable. Other: Sella is unremarkable.  Mastoid air cells are clear. IMPRESSION: Thrombus within  the straight sinus. No evidence of intracranial metastatic disease. These results will be called to the ordering clinician or representative by the Radiologist Assistant, and communication documented in the PACS or [employer] Electronically Signed   By: PMacy MisM.D.   On: 01/08/2022 11:55   NM PET Image Initial (PI) Skull Base To Thigh (F-18 FDG)  Result Date: 01/05/2022 CLINICAL DATA:  Initial treatment strategy for non-small cell lung cancer. EXAM: NUCLEAR MEDICINE PET SKULL BASE TO THIGH TECHNIQUE: 8.74 mCi F-18 FDG was injected intravenously. Full-ring PET imaging was performed from the skull base to thigh after the radiotracer. CT data was obtained and used for attenuation correction and anatomic localization. Fasting blood glucose: 136 mg/dl COMPARISON:  CT 12/26/2021 FINDINGS: Mediastinal blood pool activity: SUV max 3.25 Liver activity: SUV max NA NECK: No hypermetabolic lymph nodes in the neck. Incidental CT findings: none CHEST: There is a mass within the superior segment of the left lower lobe which measures 5.1 x 3.7 cm within SUV max of 18.24, image 37/7. Left upper lobe pulmonary nodule measures 8 mm and has an SUV max 3.82, image 28/7. Part solid nodule within the apical segment of the left upper lobe measures 2.3 cm with a 4 mm peripheral solid component, image 12/7. This has an SUV max 1.98. FDG avid left hilar, AP window, subcarinal, and right paratracheal lymph nodes identified. The index right paratracheal lymph node measures 9 mm with SUV max of 10.11, image 68/4. The subcarinal lymph node measures 8 mm with SUV max of 12.37, image 72/4. Left hilar lymph node has an SUV max 11.44. Incidental CT findings: Aortic atherosclerosis. No pericardial effusion. No pleural effusion. ABDOMEN/PELVIS: There is no abnormal FDG uptake within the liver, pancreas, or spleen. Mild asymmetric increased uptake within the left adrenal gland corresponds to a tiny nodule measuring 6 mm with SUV max  of 5.31, image 111/4. No tracer avid abdominopelvic lymph nodes identified. Incidental CT findings: Aortic atherosclerosis. Moderate size hiatal hernia identified. SKELETON: Extensive tracer avid lytic bone metastases are identified involving the proximal appendicular and axial skeleton. Lesions are too numerous to count. Of potential orthopedic significance is a large tracer avid lytic lesion involving the proximal right femur and lesser trochanter with SUV max of 13.11, image 188/4. Lytic lesion involving the posterior aspect of the left iliac bone measures 4 cm with SUV max of 14.58, image 158/4. extensive bilateral ribs and spinal metastases are noted. Metastatic lesion with pathologic compression deformity is noted L2 vertebral body with SUV max 11.93. Here, tumor extends into the left posterior elements with mass effect upon the  L1-2 and L2-3 neural foramina. Incidental CT findings: none IMPRESSION: 1. Left lower lobe lung mass is intensely FDG avid compatible with primary bronchogenic carcinoma. 2. Left upper lobe pulmonary nodule suspicious for pulmonary metastasis. 3. FDG avid ipsilateral hilar, ipsilateral mediastinal, subcarinal, and contralateral right mediastinal nodal metastasis. 4. Widespread FDG avid lytic bone metastasis. Lytic lesion within the proximal right femur may be of orthopedic significance. 5. Part solid nodule with low level FDG uptake is identified within the apical segment of the left upper lobe. Cannot exclude indolent pulmonary neoplasm such as adenocarcinoma. 6. Tiny nodule in the left adrenal gland is FDG avid. Early left adrenal metastasis cannot be excluded. Electronically Signed   By: Kerby Moors M.D.   On: 01/05/2022 16:21   CT Abdomen Pelvis W Contrast  Result Date: 12/28/2021 CLINICAL DATA:  Metastatic disease evaluation, abnormal finding on MRI. Initial staging. EXAM: CT CHEST, ABDOMEN, AND PELVIS WITH CONTRAST TECHNIQUE: Multidetector CT imaging of the chest, abdomen  and pelvis was performed following the standard protocol during bolus administration of intravenous contrast. RADIATION DOSE REDUCTION: This exam was performed according to the departmental dose-optimization program which includes automated exposure control, adjustment of the mA and/or kV according to patient size and/or use of iterative reconstruction technique. CONTRAST:  124m ISOVUE-300 IOPAMIDOL (ISOVUE-300) INJECTION 61% COMPARISON:  02/09/2010, 12/17/2021. FINDINGS: CT CHEST FINDINGS Cardiovascular: The heart is normal in size and there is no pericardial effusion. There is atherosclerotic calcification of the aorta without evidence of aneurysm. The pulmonary trunk is normal in caliber. Mediastinum/Nodes: A prominent lymph node is noted in the right paratracheal space measuring 9 mm. No axillary lymphadenopathy. The thyroid gland, trachea, and esophagus are within normal limits. A small hiatal hernia is noted. Lungs/Pleura: Emphysematous changes are present in the lungs. Irregular soft tissue thickening is noted in the hilar region on the left which is continuous with a left lower lobe mass measuring 5.3 x 4.1 x 4.5 cm, axial image 74. There is a spiculated nodule in the posterior segment of the right upper lobe measuring 9 mm, axial image 55. There is a subsolid nodular opacity in the apical segment of the left upper lobe measuring 2.9 cm, axial image 24. Scattered hazy ground-glass and nodular ground-glass opacities are noted in the right lung measuring up to 1.1 cm, axial image 61. There is a irregular subsolid nodular opacity in the right lower lobe measuring 9 mm, axial image 109. There is a trace left pleural effusion with subsegmental atelectasis in the left lower lobe and lingular segment of the left upper lobe. Musculoskeletal: Cervical spinal fusion hardware is noted. Multiple mixed lytic and sclerotic lesions are noted within the bones, compatible with metastatic disease. No acute fracture. CT  ABDOMEN PELVIS FINDINGS Hepatobiliary: No focal abnormality. Fatty infiltration of the liver is noted. No biliary ductal dilatation. The gallbladder is without stones. Pancreas: Unremarkable. No pancreatic ductal dilatation or surrounding inflammatory changes. Spleen: Normal in size without focal abnormality. Adrenals/Urinary Tract: No adrenal nodule or mass. The kidneys enhance symmetrically. No renal calculus or hydronephrosis. The bladder is unremarkable. Stomach/Bowel: Stomach is within normal limits. Appendix appears normal. No evidence of bowel wall thickening, distention, or inflammatory changes. No free air or pneumatosis. Vascular/Lymphatic: Aortic atherosclerosis. No enlarged abdominal or pelvic lymph nodes. Reproductive: Status post hysterectomy. No adnexal masses. Other: No abdominopelvic ascites. Musculoskeletal: Diffuse lytic and sclerotic lesions are noted in the bones, suggesting metastatic disease. Lumbar spinal fusion hardware is noted from L4-S1. A stable compression deformity is present  at L2, possible pathologic fracture. IMPRESSION: 1. Irregular bronchial wall thickening on the left with continuation to left lower lobe mass measuring 5.3 x 4.1 x 4.5 cm, primary neoplasm versus metastatic disease. Additional spiculated, subsolid, and ground-glass opacities are noted in the lungs bilaterally, concerning for metastasis. PET-CT and/or tissue sampling is recommended for definitive diagnosis. 2. Diffuse lytic and sclerotic lesions in the bones with possible pathologic fracture at L2, suspicious for metastatic disease. 3. Hepatic steatosis. No focal abnormality is identified in the liver. 4. Small hiatal hernia. 5. Aortic atherosclerosis. Electronically Signed   By: Brett Fairy M.D.   On: 12/28/2021 21:23   CT Chest W Contrast  Result Date: 12/28/2021 CLINICAL DATA:  Metastatic disease evaluation, abnormal finding on MRI. Initial staging. EXAM: CT CHEST, ABDOMEN, AND PELVIS WITH CONTRAST  TECHNIQUE: Multidetector CT imaging of the chest, abdomen and pelvis was performed following the standard protocol during bolus administration of intravenous contrast. RADIATION DOSE REDUCTION: This exam was performed according to the departmental dose-optimization program which includes automated exposure control, adjustment of the mA and/or kV according to patient size and/or use of iterative reconstruction technique. CONTRAST:  181m ISOVUE-300 IOPAMIDOL (ISOVUE-300) INJECTION 61% COMPARISON:  02/09/2010, 12/17/2021. FINDINGS: CT CHEST FINDINGS Cardiovascular: The heart is normal in size and there is no pericardial effusion. There is atherosclerotic calcification of the aorta without evidence of aneurysm. The pulmonary trunk is normal in caliber. Mediastinum/Nodes: A prominent lymph node is noted in the right paratracheal space measuring 9 mm. No axillary lymphadenopathy. The thyroid gland, trachea, and esophagus are within normal limits. A small hiatal hernia is noted. Lungs/Pleura: Emphysematous changes are present in the lungs. Irregular soft tissue thickening is noted in the hilar region on the left which is continuous with a left lower lobe mass measuring 5.3 x 4.1 x 4.5 cm, axial image 74. There is a spiculated nodule in the posterior segment of the right upper lobe measuring 9 mm, axial image 55. There is a subsolid nodular opacity in the apical segment of the left upper lobe measuring 2.9 cm, axial image 24. Scattered hazy ground-glass and nodular ground-glass opacities are noted in the right lung measuring up to 1.1 cm, axial image 61. There is a irregular subsolid nodular opacity in the right lower lobe measuring 9 mm, axial image 109. There is a trace left pleural effusion with subsegmental atelectasis in the left lower lobe and lingular segment of the left upper lobe. Musculoskeletal: Cervical spinal fusion hardware is noted. Multiple mixed lytic and sclerotic lesions are noted within the bones,  compatible with metastatic disease. No acute fracture. CT ABDOMEN PELVIS FINDINGS Hepatobiliary: No focal abnormality. Fatty infiltration of the liver is noted. No biliary ductal dilatation. The gallbladder is without stones. Pancreas: Unremarkable. No pancreatic ductal dilatation or surrounding inflammatory changes. Spleen: Normal in size without focal abnormality. Adrenals/Urinary Tract: No adrenal nodule or mass. The kidneys enhance symmetrically. No renal calculus or hydronephrosis. The bladder is unremarkable. Stomach/Bowel: Stomach is within normal limits. Appendix appears normal. No evidence of bowel wall thickening, distention, or inflammatory changes. No free air or pneumatosis. Vascular/Lymphatic: Aortic atherosclerosis. No enlarged abdominal or pelvic lymph nodes. Reproductive: Status post hysterectomy. No adnexal masses. Other: No abdominopelvic ascites. Musculoskeletal: Diffuse lytic and sclerotic lesions are noted in the bones, suggesting metastatic disease. Lumbar spinal fusion hardware is noted from L4-S1. A stable compression deformity is present at L2, possible pathologic fracture. IMPRESSION: 1. Irregular bronchial wall thickening on the left with continuation to left lower lobe mass  measuring 5.3 x 4.1 x 4.5 cm, primary neoplasm versus metastatic disease. Additional spiculated, subsolid, and ground-glass opacities are noted in the lungs bilaterally, concerning for metastasis. PET-CT and/or tissue sampling is recommended for definitive diagnosis. 2. Diffuse lytic and sclerotic lesions in the bones with possible pathologic fracture at L2, suspicious for metastatic disease. 3. Hepatic steatosis. No focal abnormality is identified in the liver. 4. Small hiatal hernia. 5. Aortic atherosclerosis. Electronically Signed   By: Brett Fairy M.D.   On: 12/28/2021 21:23    Pathology:  CYTOLOGY - NON PAP  CASE: [id number]  PATIENT: Misty Decker  Non-Gynecological Cytology Report   Clinical  History: Lung mass, adenopathy   FINAL MICROSCOPIC DIAGNOSIS:   A. LYMPH NODE, STATION 7, FINE NEEDLE ASPIRATION:  - Malignant cells consistent with adenocarcinoma, see comment   COMMENT:   Immunohistochemical stains show that the tumor cells are negative for  TTF-1 and cytokeratin AE1/AE3 (membranous staining); while they are  negative for synaptophysin, CD56, p63 and CK5/6.  This immunoprofile is  consistent with above interpretation.  Dr. Arby Barrette reviewed the case  and concurs with the above diagnosis.  Dr. Valeta Harms was notified on  01/09/2022.   Assessment and Plan:   1) Stager IV Metastatic non-small cell lung cancer, adenocarcinoma -12/26/2021 CT chest/abdomen/pelvis -left lower lobe mass measuring 5.3 x 4.1 x 4.5 cm, diffuse lytic and sclerotic lesions in the bones with possible pathologic fracture at L2 -01/05/2022 PET scan-left lower lobe lung mass intensely FDG avid, left upper lobe pulmonary nodule suspicious for pulmonary metastasis, FDG avid ipsilateral hilar, ipsilateral mediastinal, subcarinal, and contralateral right mediastinal nodal metastasis, widespread FDG avid lytic bone metastases with lytic lesion within the right proximal femur which may be of orthopedic significance. -01/08/2022 MRI brain with and without contrast-thrombus within the straight sinus, no evidence of intracranial metastatic disease.  2) posterior left iliac lesion/right distal inferior femoral neck, intertrochanteric, and lesser trochanteric lytic lesion at risk for pathologic fracture -01/09/2022 CT femur right-diffuse lytic metastases seen throughout the pelvis and proximal right femur, dominant right distal inferior femoral neck, intertrochanteric, and lesser trochanteric lytic lesion-increase his susceptibility for risk of pathologic fracture -01/09/2022 CT femur left-numerous lytic metastases within the pelvis, dominant partially visualized posterior left iliac lesion measuring 2.5 x 5 cm, smaller  superolateral left acetabular 1.9 cm lytic lesion which abuts the lateral cortex without definite cortical breakthrough  3) thrombus straight sinus -Seen by neurology who recommended full dose Lovenox x5 days followed by dabigatran 150 mg p.o. twice daily. -Patient has been started on apixaban now.  4) hypertension  5) hyperlipidemia  6) fibromyalgia  7) h/o cervical and lumbar spinal fusion with chronic back pain issues with L2 compression fracture and PLAN:  -We will recent diagnosis of metastatic stage IV lung adenocarcinoma and the implications of this were discussed in details. -We did discuss the causes, natural history, typical progression, treatment considerations including molecular characterization of the tumor and the patient's performance status etc. and typical treatment strategies. -We discussed her disease was not curable but potentially could be effectively treated if she had one of the targetable mutations and/or she tolerated chemoimmunotherapy well. -Patient suggest that she is quite keen to explore all treatment options. -Since she has adenocarcinoma, recommend sending tissue for foundation 1 and PD-L1 testing. -The patient is having significant pain due to her bone metastasis.  She appears to be at risk for pathologic fracture in her right femur.  She has been recommended weightbearing as tolerated with a walker -  Hopefully patient is able to transfer from bed to chair and start working with physical therapy is as her pain is controlled and we can set up an outpatient referral to Dr. Janice Coffin at [facility] by palliative care team -She was encouraged to work with physical therapy as much as she is able to. -Further discussion regarding systemic treatment options pending results of foundation 1 and PD-L1 testing.   -We will plan to start the patient on bisphosphonates soon this might also help with her bone pain.  Thank you for  this referral.   Mikey Bussing, DNP, AGPCNP-BC, AOCNP   ADDENDUM  .Patient was Personally and independently interviewed, examined and relevant elements of the history of present illness were reviewed in details and an assessment and plan was created. All elements of the patient's history of present illness , assessment and plan were discussed in details with Mikey Bussing, DNP, AGPCNP-BC, AOCNP . The above documentation reflects our combined findings assessment and plan. All of the available labs, imaging findings, diagnosis, goals of care and potential treatment approaches were discussed in detail with the patient with her sister and husband at bedside. Foundation 1 NGS and PD-L1 testing requested to pathology. Anticoagulation for venous sinus thrombosis as per neurology PT OT evaluation to determine home care needs. Outpatient referral to Dr. Janice Coffin for orthopedic oncology consideration. Plan to start bisphosphonate soon to help reduce risk of additional fractures and also to help some with bone pain. Palliative radiation to painful bony metastases. We will continue to follow  Sullivan Lone MD MS

## 2022-01-20 NOTE — Progress Notes (Addendum)
Palliative Medicine Inpatient Follow Up Note  HPI: 66 y.o. female  with past medical history of recent diagnosis of lung cancer with concern for spinal mets, sinus thrombus, HTN, HLD, fatty liver, fibromyalgia, depression admitted on 01/08/2022 with newly discovered sinus thrombus with increased pain due to new lung cancer with bone mets. She has began radiation therapy.   Today's Discussion 01/20/2022  *Please note that this is a verbal dictation therefore any spelling or grammatical errors are due to the "[place] [employer]" system interpretation.  Chart reviewed inclusive of vital signs, progress notes, laboratory results, and diagnostic images.   Brynleigh is resting in bed. She shares that she is frustrated as the physical therapy team continues to push her to get out of bed though she vocalizes that she is experiencing severe pain constantly at an 8 out of 10 which inhibits her from movement.  She shares that even with the present medication regimen she gets no relief from the pain.  She expresses after even short movements the pain is worse and is ongoing for 2 days.  Yanelli shares with me that she cannot believe the rapidity of this diagnosis.  She expresses that her neurosurgery team nor the oncology team from [facility] have given her an impression of diagnosis nor prognosis.  She says that through her own research with [employer] she has been able to gather that she is a very limited prognosis.  Amaziah asked that we try to coordinate getting the teams together so that she can hear exactly what to expect moving forward and can better prepare not only herself but her family for what the future may look like.  I met with Camila's sister, Gregary Signs and her husband later in the morning during her time together.  They also expressed frustration as they to are unsure of if any additional treatments can be offered and what the risks and benefits of those treatments may look like.  We have agreed to  communicate with both the neurosurgery team and medical oncology team in an effort to get some clarity in regard to some of these questions.  Created space and opportunity for patient to explore thoughts feelings and fears regarding current medical situation.  Saliyah and I spoke about topics such as cardiopulmonary resuscitation and she states that she would not want to be traumatized as her father was causing broken ribs and bleeding.  She also feels this topic is overwhelming and we determined that we would allow her time to further consider these questions and readdress moving forward.  Dafna has felt pushed into hospice care and she shares how can she pursue hospice when she does not even understand the gravity of what is going on.  I agreed with her and I shared if anything is done in the oncoming days we will try to get her a much better idea of again what her future will look like.  Gregary Signs states that if it anytime a meeting needs to be had she can get the family together for further conversations.  I reviewed with Keishawn that I will be modifying some of her pain medications to ideally help more comprehensively manage the overwhelming discomfort that she is constantly experiencing..  Questions and concerns addressed/Palliative Support Provided.   Objective Assessment: Vital Signs Vitals:   01/19/22 2237 01/20/22 0553  BP: 135/67 119/70  Pulse: 85 83  Resp: 20 18  Temp: 98.1 F (36.7 C) 98.1 F (36.7 C)  SpO2: 94% 94%  Intake/Output Summary (Last 24 hours) at 01/20/2022 1142 Last data filed at 01/20/2022 0800 Gross per 24 hour  Intake 715 ml  Output 1950 ml  Net -1235 ml   Last Weight  Most recent update: 01/19/2022  4:55 PM    Weight  88.8 kg (195 lb 12.3 oz)            Gen: Older Caucasian female not in any distress this morning HEENT: moist mucous membranes CV: Regular rate and rhythm PULM: On room air breathing is even and nonlabored ABD: soft/nontender EXT: No  edema Neuro: Alert and oriented x3  SUMMARY OF RECOMMENDATIONS   Full code- Patient would like more time to think about this she shares that she would not want to be traumatized especially if she were unable to have a meaningful quality of life.  I suggested that she think more about this topic and speak to her family regarding her wishes prior to [place] changing her cardiopulmonary resuscitation status.  Patient is seeking the insights of neurosurgery and medical oncology --> regarding her treatment options and likely prognosis  Palliative support and hospice were explained --> have tried to highlight the reality that hospice helps patient's maintain dignity and quality as they are encroaching the end of their life  Ongoing Palliative Support --> Vinie Sill will assume care of [facility] tomorrow  Symptoms: Pain 7/10 sciatica/burning pain down left leg from low back:  OxyIR 20 mg  every 3 hours PRN. Lyrica 75 mg daily.  Also on Cymbalta 20 mg daily for neuropathic pain as well as patient complaints of depression. Spiritual care consult as well.  Robaxin was noted to be helpful. Scheduled TID.   PO OxyContin 30 mg BID for long acting pain medication and monitor.   Continue heating pad.  Continue radiation therapy and steroids.    Bowel Regimen: Continue Senokot-S 2 tabs BID.  Dulcolax suppository daily PRN. Miralax daily. Referral to outpatient palliative care at [place], NP recommended  Time Spent: 90  Billing based on MDM: High  Problems Addressed: One acute or chronic illness or injury that poses a threat to life or bodily function  Amount and/or Complexity of Data: Category 3:Discussion of management or test interpretation with external physician/other qualified health care professional/appropriate source (not separately reported)  Risks: Parenteral controlled substances, Decision regarding hospitalization or escalation of hospital care, and Decision not to  resuscitate or to de-escalate care because of poor prognosis ______________________________________________________________________________________ [facility] Team Team Cell Phone: [phone] Please utilize secure chat with additional questions, if there is no response within 30 minutes please call the above phone number  Palliative Medicine Team providers are available by phone from 7am to 7pm daily and can be reached through the team cell phone.  Should this patient require assistance outside of these hours, please call the patient's attending physician.

## 2022-01-20 NOTE — Progress Notes (Signed)
PROGRESS NOTE  Misty Decker  ZDG:[id number] DOB: Jun 01, 1956 DOA: 01/08/2022 PCP: Lorrene Reid, PA-C   Brief Narrative:  Patient is a 66 year old female with history of hypertension, hyperlipidemia, fibromyalgia, recent diagnosis of lung mass with metastatic disease who was admitted for the management of sinus thrombosis as per outpatient MRI.  Patient was started on anticoagulation, neurology team also consulted.  Patient was transferred to [facility]  from [facility] for addition oncology evaluation.  Radiation treatment started.  Patient was also found to have metastatic bone lesions, lytic right femur lesion for which Ortho was consulted who recommended weightbearing as tolerated.  Hospital course remarkable for persistent pain, palliative care consulted and following for pain management, titrating pain medications.  Patient continues to complain of pain, does not feel ready to go home yet.   Assessment & Plan:  Principal Problem:   VTE (venous thromboembolism) Active Problems:   Essential hypertension   Blood clots in brain  Stage sinus vein thrombosis: Found as per MRI done as an outpatient.  This is secondary to malignancy induced hypercoagulability.  Neurology was also consulted on admission.  Started on Lovenox, transitioned to DOAC   Osteolytic bone lesions: Metastatic bone lesions.  Transferred to [facility] for radiation oncology treatment.  Started on radiation treatment. Found to have multiple metastatic lesions on the pelvis, proximal femur.  Orthopedics recommended weightbearing as tolerated with walker at all times, possible outpatient referral to Ortho oncology at tertiary care center. Complains of constant severe pain on the left thigh, left leg, tingling, numbness.  On Lyrica, oxycodone, OxyContin.  Palliative care managing the pain medications.  Metastatic lung cancer: Recently underwent bronchoscopy with EBUS on 8/1 by pulmonology, biopsies performed.  Cytology consistent  with adenocarcinoma.  Ongoing radiation treatment.  Continue pain management, supportive care.  Oncology follow-up recommended as an outpatient. Palliative care also consulted for goals of care.  Remains full code. Oncology consult request today for facilitating on goals of care discussion.  Hypertension: On hydrochlorothiazide  Hyperlipidemia: On Zetia.She does not have diabetes, no history of stroke, coronary artery disease or peripheral vascular disease.No indication to start on a statin to decrease LDL to less than 70  Fibromyalgia: Continue pain management, supportive care  Urinary retention: Foley catheter was placed, added Flomax.  Patient says she is unable to go to the bathroom because of pain,hardly moving.  Continue Foley catheter until pain is better controlled  Debility/deconditioning: PT/OT recommended home health on discharge.  We have  requested to have reevaluation,but patient declines  Nutrition Problem: Increased nutrient needs Etiology: cancer and cancer related treatments    DVT prophylaxis:lovenox apixaban (ELIQUIS) tablet 10 mg  apixaban (ELIQUIS) tablet 5 mg     Code Status: Full Code  Family Communication: Family at bedside on 8/9  Patient status:Inpatient  Patient is from :Home  Anticipated discharge VZ:DGLO with home health  Estimated DC date:Not sure.  Patient constantly complains of pain on her lower back, left upper thigh.  States pain medications are not helping.  Reluctant to participate with physical therapy.  Very difficult situation.  Multidisciplinary team involved   Consultants: Palliative care, radiation oncology,oncology  Procedures:None  Antimicrobials:  Anti-infectives (From admission, onward)    None       Subjective:  Patient seen and examined at the bedside this morning.  Says the same thing.  Complaining of pain.  States that pain medications are not helping.Doesnot appear in any distress  Objective: Vitals:   01/19/22  1431 01/19/22 1505 01/19/22 2237  01/20/22 0553  BP:  132/66 135/67 119/70  Pulse:  90 85 83  Resp:  20 20 18   Temp:  98.3 F (36.8 C) 98.1 F (36.7 C) 98.1 F (36.7 C)  TempSrc:  Oral Oral Oral  SpO2:  92% 94% 94%  Weight: 88.8 kg     Height:        Intake/Output Summary (Last 24 hours) at 01/20/2022 1126 Last data filed at 01/20/2022 0800 Gross per 24 hour  Intake 715 ml  Output 1950 ml  Net -1235 ml   Filed Weights   01/08/22 1923 01/19/22 1431  Weight: 79.4 kg 88.8 kg    Examination:   General exam: Overall comfortable, not in distress HEENT: PERRL Respiratory system:  no wheezes or crackles  Cardiovascular system: S1 & S2 heard, RRR.  Gastrointestinal system: Abdomen is nondistended, soft and nontender. Central nervous system: Alert and oriented Extremities: No edema, no clubbing ,no cyanosis Skin: No rashes, no ulcers,no icterus   GU: Foley   Data Reviewed: I have personally reviewed following labs and imaging studies  CBC: Recent Labs  Lab 01/14/22 0543 01/15/22 0538 01/20/22 0805  WBC 8.8 8.4 8.8  HGB 13.0 13.3 12.9  HCT 38.7 40.2 39.3  MCV 97.7 98.3 100.5*  PLT 287 303 001   Basic Metabolic Panel: Recent Labs  Lab 01/14/22 0543 01/15/22 0538 01/16/22 0703 01/20/22 0805  NA 138 138 138 137  K 4.0 3.9 4.1 4.3  CL 103 103 102 102  CO2 26 27 29 27   GLUCOSE 107* 143* 139* 126*  BUN 20 25* 20 26*  CREATININE 0.55 0.47 0.52 0.58  CALCIUM 9.2 9.2 9.3 9.2  MG 2.1 2.4 2.2  --      No results found for this or any previous visit (from the past 240 hour(s)).   Radiology Studies: No results found.  Scheduled Meds:  apixaban  10 mg Oral BID   Followed by   Derrill Memo ON 01/25/2022] apixaban  5 mg Oral BID   Chlorhexidine Gluconate Cloth  6 each Topical Daily   dexamethasone  4 mg Oral Daily   DULoxetine  20 mg Oral Daily   ezetimibe  10 mg Oral Daily   feeding supplement (KATE FARMS STANDARD 1.4)  325 mL Oral BID BM   hydrochlorothiazide  12.5  mg Oral Daily   methocarbamol  500 mg Oral Q8H   oxyCODONE  15 mg Oral Q12H   pantoprazole  40 mg Oral Daily   polyethylene glycol  17 g Oral Daily   pregabalin  75 mg Oral Daily   senna-docusate  2 tablet Oral BID   tamsulosin  0.4 mg Oral QPC breakfast   Continuous Infusions:   LOS: 11 days   Shelly Coss, MD [facility] P8/15/2023, 11:26 AM

## 2022-01-21 ENCOUNTER — Ambulatory Visit
Admit: 2022-01-21 | Discharge: 2022-01-21 | Disposition: A | Discharge status: Home / Self Care | Payer: Medicare Other | Attending: Radiation Oncology | Admitting: Radiation Oncology

## 2022-01-21 ENCOUNTER — Other Ambulatory Visit: Payer: Self-pay

## 2022-01-21 DIAGNOSIS — I8290 Acute embolism and thrombosis of unspecified vein: Secondary | ICD-10-CM | POA: Diagnosis not present

## 2022-01-21 DIAGNOSIS — Z7189 Other specified counseling: Secondary | ICD-10-CM | POA: Diagnosis not present

## 2022-01-21 DIAGNOSIS — C419 Malignant neoplasm of bone and articular cartilage, unspecified: Secondary | ICD-10-CM | POA: Diagnosis not present

## 2022-01-21 DIAGNOSIS — G893 Neoplasm related pain (acute) (chronic): Secondary | ICD-10-CM | POA: Diagnosis not present

## 2022-01-21 DIAGNOSIS — Z515 Encounter for palliative care: Secondary | ICD-10-CM | POA: Diagnosis not present

## 2022-01-21 LAB — RAD ONC ARIA SESSION SUMMARY
Course Elapsed Days: 9
Plan Fractions Treated to Date: 8
Plan Fractions Treated to Date: 8
Plan Fractions Treated to Date: 8
Plan Prescribed Dose Per Fraction: 3 Gy
Plan Prescribed Dose Per Fraction: 3 Gy
Plan Prescribed Dose Per Fraction: 3 Gy
Plan Total Fractions Prescribed: 10
Plan Total Fractions Prescribed: 10
Plan Total Fractions Prescribed: 10
Plan Total Prescribed Dose: 30 Gy
Plan Total Prescribed Dose: 30 Gy
Plan Total Prescribed Dose: 30 Gy
Reference Point Dosage Given to Date: 24 Gy
Reference Point Dosage Given to Date: 24 Gy
Reference Point Dosage Given to Date: 24 Gy
Reference Point Session Dosage Given: 3 Gy
Reference Point Session Dosage Given: 3 Gy
Reference Point Session Dosage Given: 3 Gy
Session Number: 8

## 2022-01-21 MED ORDER — METHOCARBAMOL 500 MG PO TABS
750.0000 mg | ORAL_TABLET | Freq: Three times a day (TID) | ORAL | Status: DC
  Administered 2022-01-21 – 2022-01-22 (×4): 750 mg via ORAL
  Filled 2022-01-21 (×4): qty 2

## 2022-01-21 MED ORDER — SORBITOL 70 % SOLN
30.0000 mL | Freq: Once | Status: AC
  Administered 2022-01-21: 30 mL via ORAL
  Filled 2022-01-21: qty 30

## 2022-01-21 NOTE — Progress Notes (Signed)
Palliative:  HPI: 65 y.o. female  with past medical history of recent diagnosis of lung cancer with concern for spinal mets, sinus thrombus, HTN, HLD, fatty liver, fibromyalgia, depression admitted on 01/08/2022 with newly discovered sinus thrombus with increased pain due to new lung cancer with bone mets. She has began radiation therapy.    I met today with Misty Decker and was assessing pain efficacy which she reports today at 8/10 but she has not had any PRN pain medication in 7 hours. I discussed with her the importance of staying on top of her pain with medication to try and keep it from escalating and then the medication is not as effective. She continues to report the relief she gets from Robaxin especially in tolerating radiation treatments. Misty Decker talks about her relief in speaking with oncology and knowing that she may have opportunity for treatment options. She reports that she felt much better - even relief - of knowing there could be options for treatment. I discussed further with her that oncology is still awaiting more testing results and information before they can make a clear plan for here. Then she will need to weigh the risks vs benefits of treatment offered and see how she tolerates if she decides to proceed. Chaplain, Misty Decker, joined us in conversation and was aware of some of the concerns of ensuring family is up to date on plans to help support Misty Decker. Misty Decker has voiced some concern for her pain and poor quality of life. In conversation she often voices what she feels her family would want her to do and I asked Misty Decker what she wanted for herself. Misty Decker was able to express desire to pursue treatment but she does not want to suffer. She is still hopeful that she can have improved pain control. I reassured her that we will continue to titrate and help her with her pain but we also had a realistic conversation that there is the possibility that her pain may not be able to be controlled enough to allow her  to travel back and forth to frequent doctors visits for aggressive treatment. I reassured Misty Decker we will respect her decisions acknowledging that the decision she makes today may change as time goes on. Misty Decker and I both reassure her that there are no right or wrong decisions and that she is the only one that knows how she is feeling.   Plan: Pain 8/10 sciatica/burning pain down left leg from low back:  OxyIR 20 mg  every 3 hours PRN. Lyrica 75 mg daily.  Cymbalta 20 mg daily for neuropathic pain as well as patient complaints of depression.  Spiritual care consult as well.  Robaxin increased to 750 mg TID.   OxyContin 30 mg BID for long acting pain medication and monitor.   Continue heating pad.  Continue radiation therapy and steroids.   Bowel Regimen: Continue Senokot-S 2 tabs BID.  Miralax daily. Sorbitol x 1 LBM 01/18/22. Dulcolax suppository daily PRN.  Referral to outpatient palliative care at [facility] Nikki Pickenpack-Cousar, NP recommended.  She is hopeful to pursue treatment.    60 min   , NP Palliative Medicine Team Pager [phone] (Please see [email or web address] for schedule) Team Phone [phone]    Greater than 50%  of this time was spent counseling and coordinating care related to the above assessment and plan   

## 2022-01-21 NOTE — Progress Notes (Signed)
Chaplain engaged in a follow-up visit with Natika today as she was also conversing with Palliative Care Elmo Putt, NP).  Chaplain was able to ask pertinent questions to [place].  When asked what exactly Shamia wants she was able to state that she wants to keep trying and fighting until she "falls."  Chaplain assessed that Kaedynce is open to interventions as long as the pain does not become unmanageable and yield her no quality of life.  Elmo Putt was able to speak candidly with Vaughan Basta about pain management and the possibilities of it not getting better or even getting worse, while acknowledging that there are still some things to try and do.  Annick feels open to options to figure out what she can do to have more time with some quality of life.   Chaplain continues to offer an additional layer of support, listening, and presence.    01/21/22 1500  Clinical Encounter Type  Visited With Patient;Health care provider  Visit Type Follow-up

## 2022-01-21 NOTE — Progress Notes (Signed)
PROGRESS NOTE  Misty Decker  YKD:[id number] DOB: [birth year] DOA: 01/08/2022 PCP: Lorrene Reid, PA-C   Brief Narrative:  Patient is a 66 year old female with history of hypertension, hyperlipidemia, fibromyalgia, recent diagnosis of lung mass with metastatic disease who was admitted for the management of sinus thrombosis as per outpatient MRI.  Patient was started on anticoagulation, neurology team also consulted.  Patient was transferred to [facility] from [facility] for addition oncology evaluation.  Radiation treatment started.  Patient was also found to have metastatic bone lesions, lytic right femur lesion for which Ortho was consulted who recommended weightbearing as tolerated.  Hospital course remarkable for persistent pain, palliative care consulted and following for pain management, titrating pain medications.  Patient continues to complain of pain, does not feel ready to go home yet.  Consistently denies participating with PT/OT prolonging discharge plan.   Assessment & Plan:  Principal Problem:   VTE (venous thromboembolism) Active Problems:   Essential hypertension   Blood clots in brain   Primary malignant neoplasm of left lung metastatic to other site [facility])   Malignant neoplasm of bone with metastases [facility])   Counseling regarding advance care planning and goals of care  Stage sinus vein thrombosis: Found as per MRI done as an outpatient.  This is secondary to malignancy induced hypercoagulability.  Neurology was also consulted on admission.  Started on Lovenox, transitioned to DOAC   Osteolytic bone lesions: Metastatic bone lesions.  Transferred to [facility] for radiation oncology treatment.  Started on radiation treatment. Found to have multiple metastatic lesions on the pelvis, proximal femur.  Orthopedics recommended weightbearing as tolerated with walker at all times, possible outpatient referral to Ortho oncology at tertiary care center. Complains of constant severe pain  on the left thigh, left leg, tingling, numbness.  On Lyrica, oxycodone, OxyContin.  Palliative care managing the pain medications.  Metastatic lung cancer: Recently underwent bronchoscopy with EBUS on 8/1 by pulmonology, biopsies performed.  Cytology consistent with adenocarcinoma.  Ongoing radiation treatment.  Continue pain management, supportive care.  Oncology follow-up recommended as an outpatient. Palliative care also consulted for goals of care.  Remains full code. Oncology consult also consulted  facilitating on goals of care discussion.  Recommending 1 NGS, PDL-L1 testing from pathology, PT/OT evaluation and outpatient orthopedic oncology referral, continue radiation treatment.  Hypertension: On hydrochlorothiazide  Hyperlipidemia: On Zetia.She does not have diabetes, no history of stroke, coronary artery disease or peripheral vascular disease.No indication to start on a statin to decrease LDL to less than 70  Fibromyalgia: Continue pain management, supportive care  Urinary retention: Foley catheter was placed, added Flomax.  Patient says she is unable to go to the bathroom because of pain,hardly moving.  Continue Foley catheter until pain is better controlled  Obesity: BMI  of 34.6  Debility/deconditioning: PT/OT recommended home health on discharge.  We have  requested to have reevaluation,but patient declines  Nutrition Problem: Increased nutrient needs Etiology: cancer and cancer related treatments    DVT prophylaxis:lovenox apixaban (ELIQUIS) tablet 10 mg  apixaban (ELIQUIS) tablet 5 mg     Code Status: Full Code  Family Communication: Family at bedside on 8/9  Patient status:Inpatient  Patient is from :Home  Anticipated discharge LZ:[facility] with home health  Estimated DC date:Not sure.  Patient constantly complains of pain on her lower back, left upper thigh.  Continues to decline participating with PT/OT and states she is not ready for that.  Consultants: Palliative  care, radiation oncology,oncology  Procedures:None  Antimicrobials:  Anti-infectives (From admission, onward)    None       Subjective:  Patient seen and examined at the bedside this morning.  Hemodynamically stable.  Eating her breakfast.  Looks comfortable during my evaluation but continues to complain of pain and says she is not willing to participate with PT and OT  Objective: Vitals:   01/20/22 0553 01/20/22 1640 01/20/22 2125 01/21/22 0433  BP: 119/70 129/60 134/60 (!) 145/65  Pulse: 83 91 85 85  Resp: 18 18 16 16   Temp: 98.1 F (36.7 C) 98.3 F (36.8 C) 98.5 F (36.9 C) 98.5 F (36.9 C)  TempSrc: Oral Oral Oral Oral  SpO2: 94% 93% 94% 93%  Weight:      Height:        Intake/Output Summary (Last 24 hours) at 01/21/2022 1124 Last data filed at 01/21/2022 0900 Gross per 24 hour  Intake 240 ml  Output 2100 ml  Net -1860 ml   Filed Weights   01/08/22 1923 01/19/22 1431  Weight: 79.4 kg 88.8 kg    Examination:   General exam: Overall comfortable, not in distress,obese HEENT: PERRL Respiratory system:  no wheezes or crackles  Cardiovascular system: S1 & S2 heard, RRR.  Gastrointestinal system: Abdomen is nondistended, soft and nontender. Central nervous system: Alert and oriented Extremities: No edema, no clubbing ,no cyanosis Skin: No rashes, no ulcers,no icterus   GU: Foley   Data Reviewed: I have personally reviewed following labs and imaging studies  CBC: Recent Labs  Lab 01/15/22 0538 01/20/22 0805  WBC 8.4 8.8  HGB 13.3 12.9  HCT 40.2 39.3  MCV 98.3 100.5*  PLT 303 329   Basic Metabolic Panel: Recent Labs  Lab 01/15/22 0538 01/16/22 0703 01/20/22 0805  NA 138 138 137  K 3.9 4.1 4.3  CL 103 102 102  CO2 27 29 27   GLUCOSE 143* 139* 126*  BUN 25* 20 26*  CREATININE 0.47 0.52 0.58  CALCIUM 9.2 9.3 9.2  MG 2.4 2.2  --      No results found for this or any previous visit (from the past 240 hour(s)).   Radiology Studies: No  results found.  Scheduled Meds:  apixaban  10 mg Oral BID   Followed by   Derrill Memo ON 01/25/2022] apixaban  5 mg Oral BID   Chlorhexidine Gluconate Cloth  6 each Topical Daily   dexamethasone  4 mg Oral Daily   DULoxetine  20 mg Oral Daily   ezetimibe  10 mg Oral Daily   feeding supplement (KATE FARMS STANDARD 1.4)  325 mL Oral BID BM   hydrochlorothiazide  12.5 mg Oral Daily   methocarbamol  750 mg Oral Q8H   oxyCODONE  30 mg Oral Q12H   pantoprazole  40 mg Oral Daily   polyethylene glycol  17 g Oral Daily   pregabalin  75 mg Oral Daily   senna-docusate  2 tablet Oral BID   tamsulosin  0.4 mg Oral QPC breakfast   Vitamin D (Ergocalciferol)  50,000 Units Oral Q7 days   Continuous Infusions:   LOS: 12 days   Shelly Coss, MD [facility] P8/16/2023, 11:24 AM

## 2022-01-22 ENCOUNTER — Other Ambulatory Visit: Payer: Self-pay

## 2022-01-22 ENCOUNTER — Ambulatory Visit
Admit: 2022-01-22 | Discharge: 2022-01-22 | Disposition: A | Discharge status: Home / Self Care | Payer: Medicare Other | Attending: Radiation Oncology | Admitting: Radiation Oncology

## 2022-01-22 DIAGNOSIS — I8290 Acute embolism and thrombosis of unspecified vein: Secondary | ICD-10-CM | POA: Diagnosis not present

## 2022-01-22 DIAGNOSIS — Z515 Encounter for palliative care: Secondary | ICD-10-CM | POA: Diagnosis not present

## 2022-01-22 DIAGNOSIS — G893 Neoplasm related pain (acute) (chronic): Secondary | ICD-10-CM | POA: Diagnosis not present

## 2022-01-22 DIAGNOSIS — Z7189 Other specified counseling: Secondary | ICD-10-CM | POA: Diagnosis not present

## 2022-01-22 DIAGNOSIS — C419 Malignant neoplasm of bone and articular cartilage, unspecified: Secondary | ICD-10-CM | POA: Diagnosis not present

## 2022-01-22 LAB — RAD ONC ARIA SESSION SUMMARY
Course Elapsed Days: 10
Plan Fractions Treated to Date: 9
Plan Fractions Treated to Date: 9
Plan Fractions Treated to Date: 9
Plan Prescribed Dose Per Fraction: 3 Gy
Plan Prescribed Dose Per Fraction: 3 Gy
Plan Prescribed Dose Per Fraction: 3 Gy
Plan Total Fractions Prescribed: 10
Plan Total Fractions Prescribed: 10
Plan Total Fractions Prescribed: 10
Plan Total Prescribed Dose: 30 Gy
Plan Total Prescribed Dose: 30 Gy
Plan Total Prescribed Dose: 30 Gy
Reference Point Dosage Given to Date: 27 Gy
Reference Point Dosage Given to Date: 27 Gy
Reference Point Dosage Given to Date: 27 Gy
Reference Point Session Dosage Given: 3 Gy
Reference Point Session Dosage Given: 3 Gy
Reference Point Session Dosage Given: 3 Gy
Session Number: 9

## 2022-01-22 MED ORDER — LIDOCAINE 5 % EX PTCH
1.0000 | MEDICATED_PATCH | CUTANEOUS | Status: DC
  Administered 2022-01-22 – 2022-01-28 (×7): 1 via TRANSDERMAL
  Filled 2022-01-22 (×7): qty 1

## 2022-01-22 MED ORDER — OXYCODONE HCL 5 MG PO TABS
25.0000 mg | ORAL_TABLET | ORAL | Status: DC | PRN
  Administered 2022-01-22 – 2022-01-23 (×4): 25 mg via ORAL
  Filled 2022-01-22 (×4): qty 5

## 2022-01-22 MED ORDER — SORBITOL 70 % SOLN
30.0000 mL | Freq: Every day | Status: DC | PRN
Start: 2022-01-22 — End: 2022-01-28

## 2022-01-22 MED ORDER — OXYCODONE HCL ER 40 MG PO T12A: 40.0000 mg | EXTENDED_RELEASE_TABLET | Freq: Two times a day (BID) | ORAL | Status: DC

## 2022-01-22 MED ORDER — METHOCARBAMOL 500 MG PO TABS
750.0000 mg | ORAL_TABLET | Freq: Two times a day (BID) | ORAL | Status: DC
  Administered 2022-01-23: 750 mg via ORAL
  Filled 2022-01-22: qty 2

## 2022-01-22 MED ORDER — OXYCODONE HCL ER 15 MG PO T12A
45.0000 mg | EXTENDED_RELEASE_TABLET | Freq: Two times a day (BID) | ORAL | Status: DC
  Administered 2022-01-22 – 2022-01-23 (×3): 45 mg via ORAL
  Filled 2022-01-22 (×3): qty 3

## 2022-01-22 MED ORDER — BISACODYL 10 MG RE SUPP: 10.0000 mg | Freq: Every day | RECTAL | Status: DC | PRN

## 2022-01-22 MED ORDER — METHOCARBAMOL 500 MG PO TABS
1000.0000 mg | ORAL_TABLET | Freq: Every day | ORAL | Status: DC
  Administered 2022-01-22: 1000 mg via ORAL
  Filled 2022-01-22: qty 2

## 2022-01-22 NOTE — Progress Notes (Signed)
[place] for apixaban Indication: Venous sinus thrombus  Allergies  Allergen Reactions   Codeine     Nausea and vomiting, hallucinations, bad dreams    Morphine Shortness Of Breath    B/P dropped!   Beef-Derived Products Nausea And Vomiting    Alpha Gal   Doxycycline Nausea And Vomiting   Ibuprofen     Stomach Ulcers, stomach scarred    Iodinated Contrast Media Nausea And Vomiting    Pt reports n/v only. No other reaction noted. No need for 13 hr prep.    Latex Rash   Nsaids Other (See Comments)    PT has hx of ulcers.   Penicillins Nausea And Vomiting    hives   Pork-Derived Products Nausea And Vomiting   Tramadol     Caused RLS   Gabapentin Other (See Comments)    Pt states this made her feel like she was always depressed and not caring about anything    Patient Measurements: Height: 5\' 3"  (160 cm) Weight: 88.8 kg (195 lb 12.3 oz) IBW/kg (Calculated) : 52.4  Vital Signs: Temp: 98.7 F (37.1 C) (08/17 1017) Temp Source: Oral (08/17 1017) BP: 143/73 (08/17 1017) Pulse Rate: 87 (08/17 1017)  Labs: Recent Labs    01/20/22 0805  HGB 12.9  HCT 39.3  PLT 300  CREATININE 0.58     Estimated Creatinine Clearance: 74.2 mL/min (by C-G formula based on SCr of 0.58 mg/dL).   Medical History: Past Medical History:  Diagnosis Date   Complication of anesthesia    "Hard to sedate", per pt and slow to wake up   COVID 10/2020   Degenerative joint disease    s/p cervical and lumbar fusions   Depression    pt denies   Encounter for preventive health examination    Next mammogram in 07/2009. Next colonoscopy in 09/2017. DEXA 4/09>Normal   Encounter for screening colonoscopy 10/03/2007   Normal   Fatty liver    Fibromyalgia    GERD (gastroesophageal reflux disease)    Hemorrhoid    hx of   History of hiatal hernia    Hyperlipidemia    Hypertension    Sciatica     Assessment: 83 YOF presenting with HA, outpt MRI shows  straight sinus thrombosis.  Not on anticoagulation PTA.  Pharmacy was initially consulted for Lovenox dosing, but is now consulted to change to apixaban.    SCr 0.5 CBC: Hgb and Plt WNL No bleeding or complications reported Copay check shows $0 copay for apixaban  Goal of Therapy:  Anti-Xa level 0.6-1 units/ml 4hrs after LMWH dose given Monitor platelets by anticoagulation protocol: Yes   Plan:  Continue apixaban 10mg  PO BID x7 days, followed by 5 mg PO BID.  Monitor CBC, s/s bleeding Pharmacy to provide education prior to discharge.   F/u plans for discharge    Will sign off at this time.  Please reconsult if a change in clinical status warrants re-evaluation of dosage.     Thank you for allowing pharmacy to be a part of this patient's care.   Royetta Asal, PharmD, BCPS 01/22/2022 12:25 PM

## 2022-01-22 NOTE — Progress Notes (Addendum)
Palliative:  HPI: 66 y.o. female  with past medical history of recent diagnosis of lung cancer with concern for spinal mets, sinus thrombus, HTN, HLD, fatty liver, fibromyalgia, depression admitted on 01/08/2022 with newly discovered sinus thrombus with increased pain due to new lung cancer with bone mets. She has began radiation therapy.    I met today again with Misty Decker. Misty Decker is tired and frustrated with hurting and being in pain. Seems that she is having some relief at times but difficult to know when or how long. Misty Decker is beginning to question if she will be able to pursue treatment. She appreciates all efforts to help her feel better but is frustrated with the lack of progress. We continue to discuss plans to make adjustments to continue to provide her with increased pain relief while she is anxious to get her affairs in order with her family. She shares candidly today that she does not feel that her time is as long as she wants and had hoped. She shares that she feels the need to get her affairs in order and prepare her family. We did discuss that I do feel that we will ultimately be able to give her some relief but maybe not enough relief to function and pursue treatment. We did discuss further the option of hospice to assist with ongoing pain control to try and allow her to be home with her family. She is not ready for this option but is discouraged today. I provided reassurance that we will continue to help her the best we are able.   All questions/concerns addressed. Emotional support provided.   Exam: Alert, oriented. No distress. Lying in bed. Breathing regular, unlabored. Abd soft.   Plan: Pain 8/10 sciatica/burning pain down left leg from low back:  Increase OxyIR 25 mg every 3 hours PRN. Lyrica 75 mg daily.  Cymbalta 20 mg daily for neuropathic pain as well as patient complaints of depression.  Spiritual care consult as well.  Robaxin 750 mg 0600 and 1400 with 1000 mg 2200.   Increase  OxyContin 45 mg BID for long acting pain medication and monitor.   Continue heating pad.  Continue radiation therapy and steroids.   Consider bisphosphonates. May consider methadone tomorrow if still limited relief.  Bowel Regimen: Continue Senokot-S 2 tabs BID. Miralax daily. LBM 01/22/22. Sorbitol daily PRN mod constipation. Dulcolax suppository daily PRN severe constipation.  Referral to outpatient palliative care at [place], NP recommended.  She is hopeful to pursue treatment but questioning if this will be possible with her ongoing symptom burden.   [place], NP Palliative Medicine Team Pager [phone] (Please see [email or web address] for schedule) Team Phone [phone]    Greater than 50%  of this time was spent counseling and coordinating care related to the above assessment and plan

## 2022-01-22 NOTE — Progress Notes (Signed)
PROGRESS NOTE  Misty Decker  FUX:[id number] DOB: 05-09-1956 DOA: 01/08/2022 PCP: Lorrene Reid, PA-C   Brief Narrative:  Patient is a 66 year old female with history of hypertension, hyperlipidemia, fibromyalgia, recent diagnosis of lung mass with metastatic disease who was admitted for the management of sinus thrombosis as per outpatient MRI.  Patient was started on anticoagulation, neurology team also consulted.  Patient was transferred to [facility] from [facility] for addition oncology evaluation.  Radiation treatment started.  Patient was also found to have metastatic bone lesions, lytic right femur lesion for which Ortho was consulted who recommended weightbearing as tolerated.  Hospital course remarkable for persistent pain, palliative care consulted and following for pain management, titrating pain medications.  Patient continues to complain of pain, does not feel ready to go home yet.  Consistently denies participating with PT/OT prolonging discharge plan.   Assessment & Plan:  Principal Problem:   VTE (venous thromboembolism) Active Problems:   Essential hypertension   Blood clots in brain   Primary malignant neoplasm of left lung metastatic to other site [facility])   Malignant neoplasm of bone with metastases [facility])   Counseling regarding advance care planning and goals of care  Stage sinus vein thrombosis: Found as per MRI done as an outpatient.  This is secondary to malignancy induced hypercoagulability.  Neurology was also consulted on admission.  Started on Lovenox, transitioned to [place]   Osteolytic bone lesions/persistent pain: Metastatic bone lesions.  Transferred to [facility] for radiation oncology treatment.  Started on radiation treatment. Found to have multiple metastatic lesions on the pelvis, proximal femur.  Orthopedics recommended weightbearing as tolerated with walker at all times, possible outpatient referral to Ortho oncology at tertiary care center. Complains of  constant severe pain on the left thigh, left leg, tingling, numbness.  On Lyrica, oxycodone, cymbalta,OxyContin.  Palliative care managing the pain medications.  Metastatic lung cancer: Recently underwent bronchoscopy with EBUS on 8/1 by pulmonology, biopsies performed.  Cytology consistent with adenocarcinoma.  Ongoing radiation treatment.  Continue pain management, supportive care.  Oncology follow-up recommended as an outpatient. Palliative care also consulted for goals of care.  Remains full code. Oncology consult also consulted  facilitating on goals of care discussion.  Recommending 1 NGS, PDL-L1 testing from pathology, PT/OT evaluation and outpatient orthopedic oncology referral, continue radiation treatment.  Hypertension: On hydrochlorothiazide  Hyperlipidemia: On Zetia.She does not have diabetes, no history of stroke, coronary artery disease or peripheral vascular disease.No indication to start on a statin to decrease LDL to less than 70  Fibromyalgia: Continue pain management, supportive care  Urinary retention: Foley catheter was placed, added Flomax.  We will give a voiding trial today  Obesity: BMI  of 34.6  Debility/deconditioning: PT/OT recommended home health on discharge.  We have  requested to have reevaluation,but patient declines  Nutrition Problem: Increased nutrient needs Etiology: cancer and cancer related treatments    DVT prophylaxis:lovenox apixaban (ELIQUIS) tablet 10 mg  apixaban (ELIQUIS) tablet 5 mg     Code Status: Full Code  Family Communication: Family at bedside on 8/9  Patient status:Inpatient  Patient is from :Home  Anticipated discharge GU:[facility] with home health  Estimated DC date:Not sure.  Patient constantly complains of pain on her lower back, left upper thigh.  Continues to decline participating with PT/OT and states she is not ready for that.  Consultants: Palliative care, radiation oncology,oncology  Procedures:None  Antimicrobials:   Anti-infectives (From admission, onward)    None  Subjective:  Patient seen and examined at the bedside this morning.  Again she complained of pain and says the pain medications are not helping.  She states she is not ready to participate with PT/OT.  She wants to check if we can remove the Foley.  Objective: Vitals:   01/22/22 1000 01/22/22 1017 01/22/22 1023 01/22/22 1100  BP:  (!) 143/73    Pulse:  87    Resp: 19 18 20 19   Temp:  98.7 F (37.1 C)    TempSrc:  Oral    SpO2:  94%    Weight:      Height:        Intake/Output Summary (Last 24 hours) at 01/22/2022 1130 Last data filed at 01/22/2022 1100 Gross per 24 hour  Intake --  Output 1925 ml  Net -1925 ml   Filed Weights   01/08/22 1923 01/19/22 1431  Weight: 79.4 kg 88.8 kg    Examination:   General exam: Overall comfortable, not in distress HEENT: PERRL Respiratory system:  no wheezes or crackles  Cardiovascular system: S1 & S2 heard, RRR.  Gastrointestinal system: Abdomen is nondistended, soft and nontender. Central nervous system: Alert and oriented Extremities: No edema, no clubbing ,no cyanosis Skin: No rashes, no ulcers,no icterus   GU: Foley   Data Reviewed: I have personally reviewed following labs and imaging studies  CBC: Recent Labs  Lab 01/20/22 0805  WBC 8.8  HGB 12.9  HCT 39.3  MCV 100.5*  PLT 485   Basic Metabolic Panel: Recent Labs  Lab 01/16/22 0703 01/20/22 0805  NA 138 137  K 4.1 4.3  CL 102 102  CO2 29 27  GLUCOSE 139* 126*  BUN 20 26*  CREATININE 0.52 0.58  CALCIUM 9.3 9.2  MG 2.2  --      No results found for this or any previous visit (from the past 240 hour(s)).   Radiology Studies: No results found.  Scheduled Meds:  apixaban  10 mg Oral BID   Followed by   Derrill Memo ON 01/25/2022] apixaban  5 mg Oral BID   Chlorhexidine Gluconate Cloth  6 each Topical Daily   dexamethasone  4 mg Oral Daily   DULoxetine  20 mg Oral Daily   ezetimibe  10 mg Oral  Daily   feeding supplement (KATE FARMS STANDARD 1.4)  325 mL Oral BID BM   hydrochlorothiazide  12.5 mg Oral Daily   lidocaine  1 patch Transdermal Q24H   methocarbamol  750 mg Oral Q8H   oxyCODONE  45 mg Oral Q12H   pantoprazole  40 mg Oral Daily   polyethylene glycol  17 g Oral Daily   pregabalin  75 mg Oral Daily   senna-docusate  2 tablet Oral BID   tamsulosin  0.4 mg Oral QPC breakfast   Vitamin D (Ergocalciferol)  50,000 Units Oral Q7 days   Continuous Infusions:   LOS: 13 days   Shelly Coss, MD [facility] P8/17/2023, 11:30 AM

## 2022-01-23 ENCOUNTER — Ambulatory Visit
Admit: 2022-01-23 | Discharge: 2022-01-23 | Disposition: A | Discharge status: Home / Self Care | Payer: Medicare Other | Attending: Radiation Oncology | Admitting: Radiation Oncology

## 2022-01-23 ENCOUNTER — Encounter: Payer: Self-pay | Admitting: Urology

## 2022-01-23 ENCOUNTER — Other Ambulatory Visit: Payer: Self-pay

## 2022-01-23 DIAGNOSIS — R52 Pain, unspecified: Secondary | ICD-10-CM | POA: Diagnosis not present

## 2022-01-23 DIAGNOSIS — C7951 Secondary malignant neoplasm of bone: Secondary | ICD-10-CM

## 2022-01-23 DIAGNOSIS — Z7189 Other specified counseling: Secondary | ICD-10-CM | POA: Diagnosis not present

## 2022-01-23 DIAGNOSIS — C419 Malignant neoplasm of bone and articular cartilage, unspecified: Secondary | ICD-10-CM | POA: Diagnosis not present

## 2022-01-23 DIAGNOSIS — I8290 Acute embolism and thrombosis of unspecified vein: Secondary | ICD-10-CM | POA: Diagnosis not present

## 2022-01-23 DIAGNOSIS — Z515 Encounter for palliative care: Secondary | ICD-10-CM | POA: Diagnosis not present

## 2022-01-23 LAB — RAD ONC ARIA SESSION SUMMARY
Course Elapsed Days: 11
Plan Fractions Treated to Date: 10
Plan Fractions Treated to Date: 10
Plan Fractions Treated to Date: 10
Plan Prescribed Dose Per Fraction: 3 Gy
Plan Prescribed Dose Per Fraction: 3 Gy
Plan Prescribed Dose Per Fraction: 3 Gy
Plan Total Fractions Prescribed: 10
Plan Total Fractions Prescribed: 10
Plan Total Fractions Prescribed: 10
Plan Total Prescribed Dose: 30 Gy
Plan Total Prescribed Dose: 30 Gy
Plan Total Prescribed Dose: 30 Gy
Reference Point Dosage Given to Date: 30 Gy
Reference Point Dosage Given to Date: 30 Gy
Reference Point Dosage Given to Date: 30 Gy
Reference Point Session Dosage Given: 3 Gy
Reference Point Session Dosage Given: 3 Gy
Reference Point Session Dosage Given: 3 Gy
Session Number: 10

## 2022-01-23 MED ORDER — METHOCARBAMOL 500 MG PO TABS
1000.0000 mg | ORAL_TABLET | Freq: Three times a day (TID) | ORAL | Status: DC
  Administered 2022-01-23 – 2022-01-28 (×15): 1000 mg via ORAL
  Filled 2022-01-23 (×15): qty 2

## 2022-01-23 MED ORDER — POLYETHYLENE GLYCOL 3350 17 G PO PACK
17.0000 g | PACK | Freq: Two times a day (BID) | ORAL | Status: DC
  Administered 2022-01-23 – 2022-01-28 (×9): 17 g via ORAL
  Filled 2022-01-23 (×8): qty 1

## 2022-01-23 MED ORDER — OXYCODONE HCL 5 MG PO TABS
30.0000 mg | ORAL_TABLET | ORAL | Status: DC | PRN
  Administered 2022-01-23 – 2022-01-24 (×5): 30 mg via ORAL
  Filled 2022-01-23 (×5): qty 6

## 2022-01-23 MED ORDER — OXYCODONE HCL ER 40 MG PO T12A
60.0000 mg | EXTENDED_RELEASE_TABLET | Freq: Two times a day (BID) | ORAL | Status: DC
  Administered 2022-01-23 – 2022-01-28 (×10): 60 mg via ORAL
  Filled 2022-01-23 (×10): qty 1

## 2022-01-23 NOTE — Progress Notes (Signed)
Palliative:  HPI: 66 y.o. female  with past medical history of recent diagnosis of lung cancer with concern for spinal mets, sinus thrombus, HTN, HLD, fatty liver, fibromyalgia, depression admitted on 01/08/2022 with newly discovered sinus thrombus with increased pain due to new lung cancer with bone mets. She has began radiation therapy.    I met again today with Vaughan Basta and her husband and sister, Gregary Signs, are at bedside. Donise continues to be in pain 8/10 but has not had any OxyIR in 7 hours. She reports that she slept well last night and pain seems to be improving some. Upon further discussion she shares that her chronic back pain typically stays around 6/10 before her cancer. She consistently rates her pain 8-9/10 and says it has been as low as 7/10 but 6-7/10 is more tolerable for her. Discussed continuing to increase and adjust current regimen if she feels this is making a difference vs changing course (i.e. trying methadone) and Ellanore shares that she feels this is helping and wants to continue to adjust her current regimen.   We discussed further needs once she returns home. They will need equipment (bed, bedside table, BSC, wheelchair). They ask about help with transportation to oncology appointments. They would like max support at home and help with filling pill box and managing medications if possible. They had questions about hospice and were under the impression that hospice would be helping at home and I did explain that hospice would be very helpful and appropriate but is not an option if Normandy plans to follow up with oncology for cancer treatment. This seemed to be not well understood prior to our discussion. I explained hospice at home vs home health, follow up with oncology, and outpatient palliative (my colleague in [facility]) to help manage pain. I discussed with them that we do not yet know what treatment may be offered as all the testing has not returned yet. We discussed that this can vary  significantly as she may not tolerate treatment, side effects and risks may outweigh benefits and she may not choose to pursue treatment, or she may struggle to physically make it to her appointments given her pain and debility = any of these scenarios would be a clear indication it is time for hospice. Noted some confusion regarding status and plan so family meeting was arranged for tomorrow morning.   All questions/concerns addressed. Emotional support provided.   Exam: Alert, oriented. No distress. Lying in bed. Breathing regular, unlabored. Abd soft.    Plan: Pain 8/10 sciatica/burning pain down left leg from low back:  Increase OxyIR 30 mg every 3 hours PRN. Cymbalta 20 mg daily for neuropathic pain as well as patient complaints of depression.  Spiritual care consult as well.  Robaxin 1000 mg TID.   Increase OxyContin 60 mg BID for long acting pain medication and monitor.   Continue heating pad.  Continue radiation therapy and steroids.   Consider bisphosphonates. May consider methadone if still limited relief.  Bowel Regimen: Continue Senokot-S 2 tabs BID. Miralax daily. LBM 01/22/22. Sorbitol daily PRN mod constipation. Dulcolax suppository daily PRN severe constipation.  Referral to outpatient palliative care at [place], NP recommended.  She is hopeful to pursue treatment but questioning if this will be possible with her ongoing symptom burden.  Family meeting planned for 8/19 0900 to further discuss plan moving forward and hospice vs palliative when discharged.    Vails Gate, NP Palliative Medicine Team Pager [phone] (Please  see [email or web address] for schedule) Team Phone [phone]    Greater than 50%  of this time was spent counseling and coordinating care related to the above assessment and plan

## 2022-01-23 NOTE — Progress Notes (Signed)
Pt alert and oriented. Pt transferred for radiation therpay. Report given to [place].

## 2022-01-23 NOTE — Progress Notes (Signed)
PROGRESS NOTE  Misty Decker  ZMO:[id number] DOB: 25-May-1956 DOA: 01/08/2022 PCP: Lorrene Reid, PA-C   Brief Narrative:  Patient is a 66 year old female with history of hypertension, hyperlipidemia, fibromyalgia, recent diagnosis of lung mass with metastatic disease who was admitted for the management of sinus thrombosis as per outpatient MRI.  Patient was started on anticoagulation, neurology team also consulted.  Patient was transferred to [facility] from [facility] for radiation oncology evaluation.  Radiation treatment started.  Patient was also found to have metastatic bone lesions, lytic right femur lesion for which Ortho was consulted who recommended weightbearing as tolerated.  Hospital course remarkable for persistent pain, palliative care consulted and following for pain management, titrating pain medications.  Patient continues to complain of pain, does not feel ready to go home yet.  Consistently denies participating with PT/OT prolonging discharge plan.   Assessment & Plan:  Principal Problem:   VTE (venous thromboembolism) Active Problems:   Essential hypertension   Blood clots in brain   Primary malignant neoplasm of left lung metastatic to other site [facility])   Malignant neoplasm of bone with metastases [facility]   Counseling regarding advance care planning and goals of care  Stage sinus vein thrombosis: Found as per MRI done as an outpatient.  This is secondary to malignancy induced hypercoagulability.  Neurology was also consulted on admission.  Started on Lovenox, transitioned to [place]   Osteolytic bone lesions/persistent pain: Metastatic bone lesions.  Transferred to [facility] for radiation oncology treatment.  Started on radiation treatment. Found to have multiple metastatic lesions on the pelvis, proximal femur.  Orthopedics recommended weightbearing as tolerated with walker at all times, possible outpatient referral to Ortho oncology at tertiary care center. Complains of  constant severe pain on the left thigh, left leg, tingling, numbness.  On Lyrica, oxycodone, cymbalta,OxyContin.  Palliative care managing the pain medications.She still complains of pain, does not want to be evaluated by PT.  Metastatic lung cancer: Recently underwent bronchoscopy with EBUS on 8/1 by pulmonology, biopsies performed.  Cytology consistent with adenocarcinoma.  Ongoing radiation treatment.  Continue pain management, supportive care.  Oncology follow-up recommended as an outpatient. Palliative care also consulted for goals of care.  Remains full code. Oncology consult also consulted  facilitating on goals of care discussion.  Recommending 1 NGS, PDL-L1 testing from pathology, PT/OT evaluation and outpatient orthopedic oncology referral, continue radiation treatment.  Hypertension: On hydrochlorothiazide  Hyperlipidemia: On Zetia.She does not have diabetes, no history of stroke, coronary artery disease or peripheral vascular disease.No indication to start on a statin to decrease LDL to less than 70  Fibromyalgia: Continue pain management, supportive care  Urinary retention: Foley catheter was placed, added Flomax.  Tried voiding trial on 8/17 but she failed so Foley had to put back.  She needs to follow-up with urology as an outpatient and discharged on Foley catheter  Obesity: BMI  of 34.6  Constipation: On tons of narcotics, continue aggressive bowel regimen  Debility/deconditioning/position: PT/OT recommended home health on discharge.  We have  requested to have reevaluation,but patient declines. Difficult situation.  Continues to complain of pain, states she is not ready for PT evaluation.  Multidisciplinary team involved  Nutrition Problem: Increased nutrient needs Etiology: cancer and cancer related treatments    DVT prophylaxis:lovenox apixaban (ELIQUIS) tablet 10 mg  apixaban (ELIQUIS) tablet 5 mg     Code Status: Full Code  Family Communication: Called and  discussed with daughter on 8/18  Patient status:Inpatient  Patient is from :  Home  Anticipated discharge ID:[id number] with home health  Estimated DC date:Not sure. Patient says the  pain is still there, she is not ready to be evaluated by PT   Consultants: Palliative care, radiation oncology,oncology  Procedures:None  Antimicrobials:  Anti-infectives (From admission, onward)    None       Subjective:  Patient seen and examined at the bedside this morning.  Appears comfortable when I entered the room.  Foley had to be put back yesterday because he was retaining.  She says she tried to walk and get out of the bed but could not.  Still feels terrible pain on her left hip.  She is hoping that her pain will be better soon and she can go home  Objective: Vitals:   01/22/22 2010 01/22/22 2215 01/23/22 0322 01/23/22 0602  BP:  136/70  132/65  Pulse:  82  83  Resp: 20 18 16 20   Temp:  (!) 97.5 F (36.4 C)  97.6 F (36.4 C)  TempSrc:  Oral  Oral  SpO2:  93%  92%  Weight:      Height:        Intake/Output Summary (Last 24 hours) at 01/23/2022 1109 Last data filed at 01/23/2022 0700 Gross per 24 hour  Intake 480 ml  Output 1125 ml  Net -645 ml   Filed Weights   01/08/22 1923 01/19/22 1431  Weight: 79.4 kg 88.8 kg    Examination:  General exam: Overall comfortable, not in distress, deconditioned, weak HEENT: PERRL Respiratory system:  no wheezes or crackles  Cardiovascular system: S1 & S2 heard, RRR.  Gastrointestinal system: Abdomen is nondistended, soft and nontender. Central nervous system: Alert and oriented Extremities: No edema, no clubbing ,no cyanosis, tenderness on the left hip Skin: No rashes, no ulcers,no icterus   GU: Foley  Data Reviewed: I have personally reviewed following labs and imaging studies  CBC: Recent Labs  Lab 01/20/22 0805  WBC 8.8  HGB 12.9  HCT 39.3  MCV 100.5*  PLT 235   Basic Metabolic Panel: Recent Labs  Lab 01/20/22 0805  NA  137  K 4.3  CL 102  CO2 27  GLUCOSE 126*  BUN 26*  CREATININE 0.58  CALCIUM 9.2     No results found for this or any previous visit (from the past 240 hour(s)).   Radiology Studies: No results found.  Scheduled Meds:  apixaban  10 mg Oral BID   Followed by   Derrill Memo ON 01/25/2022] apixaban  5 mg Oral BID   Chlorhexidine Gluconate Cloth  6 each Topical Daily   dexamethasone  4 mg Oral Daily   DULoxetine  20 mg Oral Daily   ezetimibe  10 mg Oral Daily   feeding supplement (KATE FARMS STANDARD 1.4)  325 mL Oral BID BM   hydrochlorothiazide  12.5 mg Oral Daily   lidocaine  1 patch Transdermal Q24H   methocarbamol  1,000 mg Oral QHS   methocarbamol  750 mg Oral BID   oxyCODONE  45 mg Oral Q12H   pantoprazole  40 mg Oral Daily   polyethylene glycol  17 g Oral Daily   senna-docusate  2 tablet Oral BID   tamsulosin  0.4 mg Oral QPC breakfast   Vitamin D (Ergocalciferol)  50,000 Units Oral Q7 days   Continuous Infusions:   LOS: 14 days   Shelly Coss, MD [facility] P8/18/2023, 11:09 AM

## 2022-01-23 NOTE — Plan of Care (Signed)
  Problem: Clinical Measurements: Goal: Ability to maintain clinical measurements within normal limits will improve Outcome: Progressing   Problem: Activity: Goal: Risk for activity intolerance will decrease Outcome: Progressing   Problem: Nutrition: Goal: Adequate nutrition will be maintained Outcome: Progressing   Problem: Coping: Goal: Level of anxiety will decrease Outcome: Progressing   Problem: Pain Managment: Goal: General experience of comfort will improve Outcome: Progressing

## 2022-01-24 DIAGNOSIS — C419 Malignant neoplasm of bone and articular cartilage, unspecified: Secondary | ICD-10-CM | POA: Diagnosis not present

## 2022-01-24 DIAGNOSIS — Z515 Encounter for palliative care: Secondary | ICD-10-CM | POA: Diagnosis not present

## 2022-01-24 DIAGNOSIS — G08 Intracranial and intraspinal phlebitis and thrombophlebitis: Secondary | ICD-10-CM | POA: Diagnosis not present

## 2022-01-24 DIAGNOSIS — Z7189 Other specified counseling: Secondary | ICD-10-CM | POA: Diagnosis not present

## 2022-01-24 DIAGNOSIS — R52 Pain, unspecified: Secondary | ICD-10-CM | POA: Diagnosis not present

## 2022-01-24 DIAGNOSIS — I8290 Acute embolism and thrombosis of unspecified vein: Secondary | ICD-10-CM | POA: Diagnosis not present

## 2022-01-24 DIAGNOSIS — I10 Essential (primary) hypertension: Secondary | ICD-10-CM | POA: Diagnosis not present

## 2022-01-24 LAB — BASIC METABOLIC PANEL
Anion gap: 6 (ref 5–15)
BUN: 22 mg/dL (ref 8–23)
CO2: 32 mmol/L (ref 22–32)
Calcium: 9 mg/dL (ref 8.9–10.3)
Chloride: 98 mmol/L (ref 98–111)
Creatinine, Ser: 0.48 mg/dL (ref 0.44–1.00)
GFR, Estimated: >60 mL/min (ref >60)
Glucose, Bld: 110 mg/dL — ABNORMAL HIGH (ref 70–99)
Potassium: 3.9 mmol/L (ref 3.5–5.1)
Sodium: 136 mmol/L (ref 135–145)

## 2022-01-24 LAB — CBC
HCT: 40.9 % (ref 36.0–46.0)
Hemoglobin: 13.2 g/dL (ref 12.0–15.0)
MCH: 32.8 pg (ref 26.0–34.0)
MCHC: 32.3 g/dL (ref 30.0–36.0)
MCV: 101.5 fL — ABNORMAL HIGH (ref 80.0–100.0)
Platelets: 262 10*3/uL (ref 150–400)
RBC: 4.03 MIL/uL (ref 3.87–5.11)
RDW: 13.9 % (ref 11.5–15.5)
WBC: 6.6 10*3/uL (ref 4.0–10.5)
nRBC: 0 % (ref 0.0–0.2)

## 2022-01-24 MED ORDER — PREGABALIN 50 MG PO CAPS
50.0000 mg | ORAL_CAPSULE | Freq: Two times a day (BID) | ORAL | Status: DC
  Administered 2022-01-24 – 2022-01-28 (×9): 50 mg via ORAL
  Filled 2022-01-24 (×9): qty 1

## 2022-01-24 MED ORDER — DULOXETINE HCL 20 MG PO CPEP
40.0000 mg | ORAL_CAPSULE | Freq: Every day | ORAL | Status: DC
  Administered 2022-01-25 – 2022-01-28 (×4): 40 mg via ORAL
  Filled 2022-01-24 (×4): qty 2

## 2022-01-24 MED ORDER — OXYCODONE HCL 5 MG PO TABS
40.0000 mg | ORAL_TABLET | ORAL | Status: DC | PRN
  Administered 2022-01-25 – 2022-01-27 (×9): 40 mg via ORAL
  Filled 2022-01-24 (×9): qty 8

## 2022-01-24 MED ORDER — ALPRAZOLAM 0.25 MG PO TABS
0.2500 mg | ORAL_TABLET | Freq: Two times a day (BID) | ORAL | Status: DC | PRN
Start: 2022-01-24 — End: 2022-01-28
  Administered 2022-01-24: 0.25 mg via ORAL
  Filled 2022-01-24: qty 1

## 2022-01-24 NOTE — Progress Notes (Signed)
Palliative:  HPI: 66 y.o. female  with past medical history of recent diagnosis of lung cancer with concern for spinal mets, sinus thrombus, HTN, HLD, fatty liver, fibromyalgia, depression admitted on 01/08/2022 with newly discovered sinus thrombus with increased pain due to new lung cancer with bone mets. She has began radiation therapy.     I met today at [facility] bedside with Ayauna and her 2 daughters, Amanda/Mandy and Janett Billow. We had a discussion regarding Genice's condition and expectations. I shared more information regarding oncology awaiting results to know the best treatment options and this will change the expectations of the effectiveness of treatment. They all understand that she has incurable stage 4 disease. We discussed the challenge of significant pain and debility that may impact ability to pursue treatment. We discussed the difference of palliative vs hospice at time of discharge. We discussed the various paths of pursuing aggressive treatment vs comfort and this impacts services after discharge. We spent time discussing with Teneshia expectations that she will have pain and it may not be any better controlled than it is currently. We discussed with Vaughan Basta her wishes and priority for care and she does wish to meet with oncology and consider treatment. I discussed with them fully when hospice would be appropriate. We also discussed code status at length. Valrie shares her experience with her father and during initial discussion she favors DNR. After further discussion with her family at bedside she ultimately desires full code at this time but they all agree this may be reconsidering into the future depending on her overall goals and progression.   Kayci and daughters had many good questions. Able to clarify plan of care moving forward and goals. Good discussion today. Able to answer questions and address concerns. Emotional support provided. Daughters would like to continue to be updated. They are  hopeful for return home early next week. TOC consulted to assist with obtaining equipment in home (will need hospital bed at a minimum).   Plan: Pain 8/10 sciatica/burning pain down left leg from low back:  Increase OxyIR 40 mg every 3 hours PRN. Cymbalta 20 mg daily for neuropathic pain as well as patient complaints of depression.  Spiritual care consult as well.  Robaxin 1000 mg TID.   OxyContin 60 mg BID.  Continue heating pad.  Continue radiation therapy and steroids.   Consider bisphosphonates. May consider methadone if still limited relief.  Bowel Regimen: Continue Senokot-S 2 tabs BID. Miralax daily. LBM 01/23/22. Sorbitol daily PRN mod constipation - this has been effective when needed.  Referral to outpatient palliative care at [place], NP recommended.  She is hopeful to pursue treatment. Opts for full code at this time but seems to have low threshold for DNR - family have encouraged full code but also shared that they would respect her decision for DNR if this is what she wants.   993 min  Charnee Turnipseed, NP Palliative Medicine Team Pager [phone] (Please see [email or web address] for schedule) Team Phone [phone]    Greater than 50%  of this time was spent counseling and coordinating care related to the above assessment and plan

## 2022-01-24 NOTE — Progress Notes (Signed)
Pt. Started complaining of sternal chest pain.  EKG obtained, vitals obtained, Pearletha Forge, MD notified.  Awaiting for further instruction.

## 2022-01-24 NOTE — Progress Notes (Signed)
PROGRESS NOTE  Misty Decker  IEP:[id number] DOB: 29-Oct-1955 DOA: 01/08/2022 PCP: Lorrene Reid, PA-C   Brief Narrative:  Patient is a 66 year old female with history of hypertension, hyperlipidemia, fibromyalgia, recent diagnosis of lung mass with metastatic disease who was admitted for the management of sinus thrombosis as per outpatient MRI.  Patient was started on anticoagulation, neurology team also consulted.  Patient was transferred to [facility] from [facility] for radiation oncology evaluation.  Radiation treatment started.  Patient was also found to have metastatic bone lesions, lytic right femur lesion for which Ortho was consulted who recommended weightbearing as tolerated.  Hospital course remarkable for persistent pain, palliative care consulted and following for pain management, titrating pain medications.  Patient continues to complain of pain, does not feel ready to go home yet.     Assessment & Plan:  Principal Problem:   VTE (venous thromboembolism) Active Problems:   Essential hypertension   Blood clots in brain   Primary malignant neoplasm of left lung metastatic to other site [facility])   Malignant neoplasm of bone with metastases [facility])   Counseling regarding advance care planning and goals of care  Straight sinus vein thrombosis: Found as per MRI brain done as an outpatient.  This is secondary to malignancy induced hypercoagulability.  Neurology was also consulted on admission.  Started on Lovenox, transitioned to [place]   Osteolytic bone lesions/persistent pain: Metastatic bone lesions.  Transferred to [facility] for radiation oncology treatment.  Started on radiation treatment. Found to have multiple metastatic lesions on the pelvis, proximal femur.  Orthopedics recommended weightbearing as tolerated with walker at all times, possible outpatient referral to Ortho oncology at tertiary care center. Complains of constant severe pain on the left thigh, left leg, tingling,  numbness.  On oxycodone, cymbalta,OxyContin.  Palliative care managing the pain medications. Increase cymbalta and add in lyrica  Metastatic lung cancer, adenocarcinoma: Recently underwent bronchoscopy with EBUS on 8/1 by pulmonology, biopsies performed.  Cytology consistent with adenocarcinoma.  Ongoing radiation treatment.  Continue pain management, supportive care.  Oncology follow-up recommended as an outpatient. Palliative care also consulted for goals of care.  Remains full code. Oncology consult also consulted  facilitating on goals of care discussion.  Recommending 1 NGS, PDL-L1 testing from pathology, PT/OT evaluation and outpatient orthopedic oncology referral, continue radiation treatment.  Hypertension: On hydrochlorothiazide  Hyperlipidemia: On Zetia.She does not have diabetes, no history of stroke, coronary artery disease or peripheral vascular disease.No indication to start on a statin to decrease LDL to less than 70  Fibromyalgia: Continue pain management, supportive care  Urinary retention: Foley catheter was placed, added Flomax.  Tried voiding trial on 8/17 but she failed so Foley had to put back.  She needs to follow-up with urology as an outpatient and discharged on Foley catheter  Obesity: BMI  of 34.6  Constipation: She is on opiates and is not very mobile, continue aggressive bowel regimen  Debility/deconditioning/position: PT/OT recommended home health on discharge.  Will likely need DME such as wheelchair, hospital bed  Nutrition Problem: Increased nutrient needs Etiology: cancer and cancer related treatments    DVT prophylaxis:lovenox apixaban (ELIQUIS) tablet 10 mg  apixaban (ELIQUIS) tablet 5 mg     Code Status: Full Code  Family Communication: discussed with daughters at the bedside 8/19  Patient status:Inpatient  Patient is from :Home  Anticipated discharge YS:AYTK with home health  Estimated DC date:Possible discharge in next few days if DME is  set up at home and pain is  reasonably controlled  Consultants: Palliative care, radiation oncology,oncology  Procedures:None  Antimicrobials:  Anti-infectives (From admission, onward)    None       Subjective:  She continues to have uncontrolled pain. Described partly as burning pain. Had 2 BMs yesterday. Had chest discomfort this morning. Family feels this may have been more related to mood/anxiety. She was upset that food/meds were not on schedule. She is feeling better from that perspective now.  Objective: Vitals:   01/24/22 0834 01/24/22 0839 01/24/22 0900 01/24/22 1000  BP:  (!) 145/70    Pulse:  85    Resp: (!) 23 16 18  (!) 21  Temp:      TempSrc:      SpO2:  93%    Weight:      Height:        Intake/Output Summary (Last 24 hours) at 01/24/2022 1127 Last data filed at 01/24/2022 0732 Gross per 24 hour  Intake 480 ml  Output 2200 ml  Net -1720 ml   Filed Weights   01/08/22 1923 01/19/22 1431  Weight: 79.4 kg 88.8 kg    Examination:  General exam: Overall comfortable, not in distress, deconditioned, weak HEENT: PERRL Respiratory system:  no wheezes or crackles  Cardiovascular system: S1 & S2 heard, RRR.  Gastrointestinal system: Abdomen is nondistended, soft and nontender. Central nervous system: Alert and oriented Extremities: No edema, no clubbing ,no cyanosis, tenderness on the left hip Skin: No rashes, no ulcers,no icterus   GU: Foley  Data Reviewed: I have personally reviewed following labs and imaging studies  CBC: Recent Labs  Lab 01/20/22 0805 01/24/22 0558  WBC 8.8 6.6  HGB 12.9 13.2  HCT 39.3 40.9  MCV 100.5* 101.5*  PLT 300 865   Basic Metabolic Panel: Recent Labs  Lab 01/20/22 0805 01/24/22 0558  NA 137 136  K 4.3 3.9  CL 102 98  CO2 27 32  GLUCOSE 126* 110*  BUN 26* 22  CREATININE 0.58 0.48  CALCIUM 9.2 9.0     No results found for this or any previous visit (from the past 240 hour(s)).   Radiology Studies: No  results found.  Scheduled Meds:  apixaban  10 mg Oral BID   Followed by   Derrill Memo ON 01/25/2022] apixaban  5 mg Oral BID   Chlorhexidine Gluconate Cloth  6 each Topical Daily   dexamethasone  4 mg Oral Daily   [START ON 01/25/2022] DULoxetine  40 mg Oral Daily   ezetimibe  10 mg Oral Daily   feeding supplement (KATE FARMS STANDARD 1.4)  325 mL Oral BID BM   hydrochlorothiazide  12.5 mg Oral Daily   lidocaine  1 patch Transdermal Q24H   methocarbamol  1,000 mg Oral TID   oxyCODONE  60 mg Oral Q12H   pantoprazole  40 mg Oral Daily   polyethylene glycol  17 g Oral BID   pregabalin  50 mg Oral BID   senna-docusate  2 tablet Oral BID   tamsulosin  0.4 mg Oral QPC breakfast   Vitamin D (Ergocalciferol)  50,000 Units Oral Q7 days   Continuous Infusions:   LOS: 15 days   Kathie Dike, MD [facility] 01/24/2022, 11:27 AM

## 2022-01-25 DIAGNOSIS — Z515 Encounter for palliative care: Secondary | ICD-10-CM | POA: Diagnosis not present

## 2022-01-25 DIAGNOSIS — R52 Pain, unspecified: Secondary | ICD-10-CM | POA: Diagnosis not present

## 2022-01-25 DIAGNOSIS — C419 Malignant neoplasm of bone and articular cartilage, unspecified: Secondary | ICD-10-CM | POA: Diagnosis not present

## 2022-01-25 DIAGNOSIS — G08 Intracranial and intraspinal phlebitis and thrombophlebitis: Secondary | ICD-10-CM | POA: Diagnosis not present

## 2022-01-25 DIAGNOSIS — Z7189 Other specified counseling: Secondary | ICD-10-CM | POA: Diagnosis not present

## 2022-01-25 DIAGNOSIS — I8290 Acute embolism and thrombosis of unspecified vein: Secondary | ICD-10-CM | POA: Diagnosis not present

## 2022-01-25 DIAGNOSIS — I10 Essential (primary) hypertension: Secondary | ICD-10-CM | POA: Diagnosis not present

## 2022-01-25 MED ORDER — PANTOPRAZOLE SODIUM 20 MG PO TBEC
20.0000 mg | DELAYED_RELEASE_TABLET | Freq: Two times a day (BID) | ORAL | Status: DC
  Administered 2022-01-25 – 2022-01-28 (×6): 20 mg via ORAL
  Filled 2022-01-25 (×8): qty 1

## 2022-01-25 MED ORDER — ALPRAZOLAM 0.5 MG PO TABS
0.5000 mg | ORAL_TABLET | Freq: Every day | ORAL | Status: DC
  Administered 2022-01-25 – 2022-01-27 (×3): 0.5 mg via ORAL
  Filled 2022-01-25 (×3): qty 1

## 2022-01-25 MED ORDER — CELECOXIB 200 MG PO CAPS
200.0000 mg | ORAL_CAPSULE | Freq: Every day | ORAL | Status: DC
Start: 2022-01-25 — End: 2022-01-28
  Administered 2022-01-25 – 2022-01-28 (×4): 200 mg via ORAL
  Filled 2022-01-25 (×4): qty 1

## 2022-01-25 MED ORDER — PANTOPRAZOLE SODIUM 40 MG PO TBEC: 40.0000 mg | DELAYED_RELEASE_TABLET | Freq: Two times a day (BID) | ORAL | Status: DC

## 2022-01-25 MED ORDER — ZOLEDRONIC ACID 4 MG/5ML IV CONC
4.0000 mg | Freq: Once | INTRAVENOUS | Status: AC
  Administered 2022-01-25: 4 mg via INTRAVENOUS
  Filled 2022-01-25: qty 5

## 2022-01-25 MED ORDER — DEXAMETHASONE 4 MG PO TABS
2.0000 mg | ORAL_TABLET | Freq: Every day | ORAL | Status: DC
  Administered 2022-01-26 – 2022-01-28 (×3): 2 mg via ORAL
  Filled 2022-01-25 (×3): qty 1

## 2022-01-25 NOTE — Progress Notes (Signed)
PROGRESS NOTE  Misty Decker  BSW:[id number] DOB: 12-07-1955 DOA: 01/08/2022 PCP: Lorrene Reid, PA-C   Brief Narrative:  Patient is a 66 year old female with history of hypertension, hyperlipidemia, fibromyalgia, recent diagnosis of lung mass with metastatic disease who was admitted for the management of sinus thrombosis as per outpatient MRI.  Patient was started on anticoagulation, neurology team also consulted.  Patient was transferred to [facility] from [facility] for radiation oncology evaluation.  Radiation treatment started.  Patient was also found to have metastatic bone lesions, lytic right femur lesion for which Ortho was consulted who recommended weightbearing as tolerated.  Hospital course remarkable for persistent pain, palliative care consulted and following for pain management, titrating pain medications.  Patient continues to complain of pain, does not feel ready to go home yet.     Assessment & Plan:  Principal Problem:   VTE (venous thromboembolism) Active Problems:   Essential hypertension   Blood clots in brain   Primary malignant neoplasm of left lung metastatic to other site [facility])   Malignant neoplasm of bone with metastases [facility])   Counseling regarding advance care planning and goals of care  Straight sinus vein thrombosis: Found as per MRI brain done as an outpatient.  This is secondary to malignancy induced hypercoagulability.  Neurology was also consulted on admission.  Started on Lovenox, transitioned to [place]   Osteolytic bone lesions/persistent pain: Metastatic bone lesions.  Transferred to [facility] for radiation oncology treatment.  Started on radiation treatment. Found to have multiple metastatic lesions on the pelvis, proximal femur.  Orthopedics recommended weightbearing as tolerated with walker at all times, possible outpatient referral to Ortho oncology at tertiary care center. Complains of constant severe pain on the left thigh, left leg, tingling,  numbness.  On oxycodone, cymbalta,OxyContin.  Palliative care managing the pain medications.  Started on Celebrex, may consider methadone  Metastatic lung cancer, adenocarcinoma: Recently underwent bronchoscopy with EBUS on 8/1 by pulmonology, biopsies performed.  Cytology consistent with adenocarcinoma.  Ongoing radiation treatment.  Continue pain management, supportive care.  Oncology follow-up recommended as an outpatient. Palliative care also consulted for goals of care.  Remains full code. Oncology consult also consulted  facilitating on goals of care discussion.  Recommending 1 NGS, PDL-L1 testing from pathology, PT/OT evaluation and outpatient orthopedic oncology referral, continue radiation treatment.  Hypertension: On hydrochlorothiazide  Hyperlipidemia: On Zetia.She does not have diabetes, no history of stroke, coronary artery disease or peripheral vascular disease.No indication to start on a statin to decrease LDL to less than 70  Fibromyalgia: Continue pain management, supportive care  Urinary retention: Foley catheter was placed, added Flomax.  Tried voiding trial on 8/17 but she failed so Foley had to put back.  She needs to follow-up with urology as an outpatient and discharged on Foley catheter  Obesity: BMI  of 34.6  Constipation: She is on opiates and is not very mobile, continue aggressive bowel regimen -Constipation now resolved,  Debility/deconditioning/position: PT/OT recommended home health on discharge.  Will likely need DME such as wheelchair, hospital bed  Nutrition Problem: Increased nutrient needs Etiology: cancer and cancer related treatments    DVT prophylaxis:lovenox apixaban (ELIQUIS) tablet 5 mg     Code Status: Full Code  Family Communication: No family present today  Patient status:Inpatient  Patient is from :Home  Anticipated discharge GY:KZLD with home health  Estimated DC date:Possible discharge in next few days if DME is set up at home and  pain is reasonably controlled  Consultants: Palliative  care, radiation oncology,oncology  Procedures:None  Antimicrobials:  Anti-infectives (From admission, onward)    None       Subjective:  She is having bowel movements.  Feels as though her pain is similar to what it was yesterday  Objective: Vitals:   01/24/22 1400 01/24/22 2225 01/25/22 0500 01/25/22 1409  BP:  135/71 122/69 125/64  Pulse:  84 78 88  Resp: 12 14 14 16   Temp:  97.7 F (36.5 C) 98.1 F (36.7 C) 98.3 F (36.8 C)  TempSrc:  Oral Oral Oral  SpO2:  95% 94% 95%  Weight:      Height:        Intake/Output Summary (Last 24 hours) at 01/25/2022 1815 Last data filed at 01/25/2022 1700 Gross per 24 hour  Intake 720 ml  Output 2300 ml  Net -1580 ml   Filed Weights   01/08/22 1923 01/19/22 1431  Weight: 79.4 kg 88.8 kg    Examination:  General exam: Overall comfortable, not in distress, deconditioned, weak HEENT: PERRL Respiratory system:  no wheezes or crackles  Cardiovascular system: S1 & S2 heard, RRR.  Gastrointestinal system: Abdomen is nondistended, soft and nontender. Central nervous system: Alert and oriented Extremities: No edema, no clubbing ,no cyanosis, tenderness on the left hip Skin: No rashes, no ulcers,no icterus   GU: Foley  Data Reviewed: I have personally reviewed following labs and imaging studies  CBC: Recent Labs  Lab 01/20/22 0805 01/24/22 0558  WBC 8.8 6.6  HGB 12.9 13.2  HCT 39.3 40.9  MCV 100.5* 101.5*  PLT 300 924   Basic Metabolic Panel: Recent Labs  Lab 01/20/22 0805 01/24/22 0558  NA 137 136  K 4.3 3.9  CL 102 98  CO2 27 32  GLUCOSE 126* 110*  BUN 26* 22  CREATININE 0.58 0.48  CALCIUM 9.2 9.0     No results found for this or any previous visit (from the past 240 hour(s)).   Radiology Studies: No results found.  Scheduled Meds:  ALPRAZolam  0.5 mg Oral QHS   apixaban  5 mg Oral BID   celecoxib  200 mg Oral Daily   Chlorhexidine Gluconate  Cloth  6 each Topical Daily   [START ON 01/26/2022] dexamethasone  2 mg Oral Daily   DULoxetine  40 mg Oral Daily   ezetimibe  10 mg Oral Daily   feeding supplement (KATE FARMS STANDARD 1.4)  325 mL Oral BID BM   hydrochlorothiazide  12.5 mg Oral Daily   lidocaine  1 patch Transdermal Q24H   methocarbamol  1,000 mg Oral TID   oxyCODONE  60 mg Oral Q12H   pantoprazole  20 mg Oral BID   polyethylene glycol  17 g Oral BID   pregabalin  50 mg Oral BID   senna-docusate  2 tablet Oral BID   tamsulosin  0.4 mg Oral QPC breakfast   Vitamin D (Ergocalciferol)  50,000 Units Oral Q7 days   Continuous Infusions:   LOS: 16 days   Kathie Dike, MD [facility] 01/25/2022, 6:15 PM

## 2022-01-25 NOTE — Progress Notes (Signed)
Patient Name: Misty Decker  DOB: 07-11-1955 Age: 66 y.o.  Attending Physician: Kathie Dike, MD MRN: [id number]    Primary Care Physician: Lorrene Reid, PA-C Admit Date: 01/08/2022  Date: 01/25/2022  Subjective  Slept better last PM. Pain still limiting weight bearing. Patient has been thinking about her condition. She is awaiting gene testing. Her main goal is being able to go home.    Objective   Allergies:  Allergies  Allergen Reactions   Codeine     Nausea and vomiting, hallucinations, bad dreams    Morphine Shortness Of Breath    B/P dropped!   Beef-Derived Products Nausea And Vomiting    Alpha Gal   Doxycycline Nausea And Vomiting   Ibuprofen     Stomach Ulcers, stomach scarred    Iodinated Contrast Media Nausea And Vomiting    Pt reports n/v only. No other reaction noted. No need for 13 hr prep.    Latex Rash   Nsaids Other (See Comments)    PT has hx of ulcers.   Penicillins Nausea And Vomiting    hives   Pork-Derived Products Nausea And Vomiting   Tramadol     Caused RLS   Gabapentin Other (See Comments)    Pt states this made her feel like she was always depressed and not caring about anything    Current Medications: Scheduled Meds:   ALPRAZolam  0.5 mg Oral QHS   apixaban  5 mg Oral BID   celecoxib  200 mg Oral Daily   Chlorhexidine Gluconate Cloth  6 each Topical Daily   [START ON 01/26/2022] dexamethasone  2 mg Oral Daily   DULoxetine  40 mg Oral Daily   ezetimibe  10 mg Oral Daily   feeding supplement (KATE FARMS STANDARD 1.4)  325 mL Oral BID BM   hydrochlorothiazide  12.5 mg Oral Daily   lidocaine  1 patch Transdermal Q24H   methocarbamol  1,000 mg Oral TID   oxyCODONE  60 mg Oral Q12H   pantoprazole  20 mg Oral BID   polyethylene glycol  17 g Oral BID   pregabalin  50 mg Oral BID   senna-docusate  2 tablet Oral BID   tamsulosin  0.4 mg Oral QPC breakfast   Vitamin D (Ergocalciferol)  50,000 Units Oral Q7 days   Infusions:    zoledronic acid (ZOMETA) 4 mg in sodium chloride 0.9 % 100 mL IVPB     PRN Meds: acetaminophen, ALPRAZolam, alum & mag hydroxide-simeth, bisacodyl, guaiFENesin, hydrALAZINE, ipratropium-albuterol, metoprolol tartrate, ondansetron, mouth rinse, oxyCODONE, sorbitol, traZODone  Vital signs in last 24 hours: Temp:  [97.7 F (36.5 C)-98.1 F (36.7 C)] 98.1 F (36.7 C) (08/20 0500) Pulse Rate:  [78-84] 78 (08/20 0500) Resp:  [11-19] 14 (08/20 0500) BP: (122-135)/(69-72) 122/69 (08/20 0500) SpO2:  [92 %-95 %] 94 % (08/20 0500) Wt Readings from Last 1 Encounters:  01/19/22 88.8 kg    Intake/Output from previous day: 08/19 0701 - 08/20 0700 In: -  Out: 1975 [Urine:1975] Intake/Output this shift: Total I/O In: 240 [P.O.:240] Out: 350 [Urine:350]  Lab Results: Recent Labs    01/24/22 0558  WBC 6.6  HGB 13.2  HCT 40.9  PLT 262   BMET Recent Labs    01/24/22 0558  NA 136  K 3.9  CL 98  CO2 32  GLUCOSE 110*  BUN 22  CREATININE 0.48  CALCIUM 9.0    Studies/Results: No results found.  Medications: I have reviewed the patient's current medications.  Assessment  Metastatic Lung Cancer Cancer Related Pain, Bone Metastasis Prior Hx of Chronic Pain, multiple spinal fusions  Palliative Care Assessment & Plan   1. Code Status: Still considering options 2. Goals of Care : Await gene testing for treatable target,regain ability to ambulate and go home, pain control 3. Spiritual Care:  following 4. Symptom control  Adding Celebrex once daily Will give Zometa X1 We discussed methadone-this would be best medication for her pain but will wait on clarification of treatment plan re: cancer intervention. May also be more interventional options. 5. Antcipated Discharge Plan: Palliative Care Oncology Clinic follow-up.         Plan    LOS: 16 days        Lane Hacker 01/25/2022, 11:24 AM

## 2022-01-25 NOTE — Plan of Care (Signed)
  Problem: Coping: Goal: Level of anxiety will decrease Outcome: Progressing   Problem: Pain Managment: Goal: General experience of comfort will improve Outcome: Progressing   

## 2022-01-26 ENCOUNTER — Other Ambulatory Visit: Payer: Self-pay | Admitting: Oncology

## 2022-01-26 DIAGNOSIS — C7951 Secondary malignant neoplasm of bone: Secondary | ICD-10-CM

## 2022-01-26 DIAGNOSIS — I8290 Acute embolism and thrombosis of unspecified vein: Secondary | ICD-10-CM | POA: Diagnosis not present

## 2022-01-26 DIAGNOSIS — C419 Malignant neoplasm of bone and articular cartilage, unspecified: Secondary | ICD-10-CM | POA: Diagnosis not present

## 2022-01-26 DIAGNOSIS — I10 Essential (primary) hypertension: Secondary | ICD-10-CM | POA: Diagnosis not present

## 2022-01-26 DIAGNOSIS — G08 Intracranial and intraspinal phlebitis and thrombophlebitis: Secondary | ICD-10-CM | POA: Diagnosis not present

## 2022-01-26 NOTE — Progress Notes (Signed)
Chaplain followed up with Vaughan Basta who discussed her need to get things in order.  She is working on completing some durable POA documents to ensure things are set up accordingly for her daughters, husband, and family.  Chaplain provided some guidance on how to have those documents completed while hospitalized.  Though our notaries at the hospital are unable to work with financial, business, assets POA documents, Bonney Roussel was able to tell Zykeria about how to contact a Oncologist and the support Chaplains can offer to help obtain witnesses.  Breiana does know a Patent examiner and plans to call that person for help.   Chaplain also offered some reflective listening as Alahni wanted the medical team to know that she has been doing all that she can to keep going.  She wants the team to know that she has not given up.  Chaplain assessed that Yanin began experiencing some anxiety possibly around discharging and wanting to ensure that she has done everything possible to move to the next stage.  Chaplain offered assurance that the medical team knows that she is doing everything that she can.  Chaplain continues to remind Angeline that she is not a burden or problem to the medical team but that we are here to serve and help her.    Chaplain continues to offer a compassionate presence, support, and listening.     01/26/22 1600  Clinical Encounter Type  Visited With Patient  Visit Type Follow-up

## 2022-01-26 NOTE — TOC Progression Note (Addendum)
Transition of Care [facility]) - Progression Note    Patient Details  Name: Misty Decker MRN: [id number] Date of Birth: [birth year]  Transition of Care [facility]) CM/SW Contact  Ross Ludwig, [place] Phone Number: 01/26/2022, 4:57 PM  Clinical Narrative:     CSW received consult that patient will need hospital bed, 3 in 1 and wheelchair if she qualifies.  Patient will need [facility] services as well.  CSW to continue to follow patient's progress throughout discharge planning.  5:10pm  CSW spoke to patient, she says she only needs the hospital bed, she has the other equipment already.  Patient is interested in [facility] services, CSW requested orders from MD for [facility] PT, OT, RN, [place], and Soc Work along with hospital bed.  Also she would like outpatient palliative no preference for agency for [facility] or palliative.  CSW alerted Danielle from [place] to be aware of hospital bed to be ordered.   Expected Discharge Plan: [place] Barriers to Discharge: Continued Medical Work up  Expected Discharge Plan and Services Expected Discharge Plan: [place] In-house Referral: Clinical Social Work     Living arrangements for the past 2 months: Single Family Home                                       Social Determinants of Health (SDOH) Interventions    Readmission Risk Interventions     No data to display

## 2022-01-26 NOTE — Progress Notes (Signed)
PROGRESS NOTE  Misty Decker  SHF:[id number] DOB: [birth year] DOA: 01/08/2022 PCP: Lorrene Reid, PA-C   Brief Narrative:  Patient is a 66 year old female with history of hypertension, hyperlipidemia, fibromyalgia, recent diagnosis of lung mass with metastatic disease who was admitted for the management of sinus thrombosis as per outpatient MRI.  Patient was started on anticoagulation, neurology team also consulted.  Patient was transferred to [facility] from [facility] for radiation oncology evaluation.  Radiation treatment started.  Patient was also found to have metastatic bone lesions, lytic right femur lesion for which Ortho was consulted who recommended weightbearing as tolerated.  Hospital course remarkable for persistent pain, palliative care consulted and following for pain management, titrating pain medications.  Patient continues to complain of pain, does not feel ready to go home yet.     Assessment & Plan:  Principal Problem:   VTE (venous thromboembolism) Active Problems:   Essential hypertension   Blood clots in brain   Primary malignant neoplasm of left lung metastatic to other site [facility])   Malignant neoplasm of bone with metastases [facility])   Counseling regarding advance care planning and goals of care  Straight sinus vein thrombosis: Found as per MRI brain done as an outpatient.  This is secondary to malignancy induced hypercoagulability.  Neurology was also consulted on admission.  Started on Lovenox, transitioned to [place]   Osteolytic bone lesions/persistent pain: Metastatic bone lesions.  Transferred to [facility] for radiation oncology treatment.  Started on radiation treatment. Found to have multiple metastatic lesions on the pelvis, proximal femur.  Orthopedics recommended weightbearing as tolerated with walker at all times, possible outpatient referral to Ortho oncology at tertiary care center. Complains of constant severe pain on the left thigh, left leg, tingling,  numbness.  On oxycodone, cymbalta,OxyContin.  Palliative care managing the pain medications.  Started on Celebrex, may consider methadone  Metastatic lung cancer, adenocarcinoma: Recently underwent bronchoscopy with EBUS on 8/1 by pulmonology, biopsies performed.  Cytology consistent with adenocarcinoma.  Ongoing radiation treatment.  Continue pain management, supportive care.  Oncology follow-up recommended as an outpatient. Palliative care also consulted for goals of care.  Remains full code. Oncology consult also consulted  facilitating on goals of care discussion.  Recommending 1 NGS, PDL-L1 testing from pathology, PT/OT evaluation and outpatient orthopedic oncology referral, continue radiation treatment.  Hypertension: On hydrochlorothiazide  Hyperlipidemia: On Zetia.She does not have diabetes, no history of stroke, coronary artery disease or peripheral vascular disease.No indication to start on a statin to decrease LDL to less than 70  Fibromyalgia: Continue pain management, supportive care  Urinary retention: Foley catheter was placed, added Flomax.  Tried voiding trial on 8/17 but she failed so Foley had to put back.  She needs to follow-up with urology as an outpatient and discharged on Foley catheter  Obesity: BMI  of 34.6  Constipation: She is on opiates and is not very mobile, continue aggressive bowel regimen -Constipation now resolved,  Debility/deconditioning/position: PT/OT recommended home health on discharge.  Will likely need DME such as wheelchair, hospital bed  Nutrition Problem: Increased nutrient needs Etiology: cancer and cancer related treatments    DVT prophylaxis:lovenox apixaban (ELIQUIS) tablet 5 mg     Code Status: Full Code  Family Communication: No family present today  Patient status:Inpatient  Patient is from :Home  Anticipated discharge FO:YDXA with home health  Estimated DC date:Possible discharge in next few days if DME is set up at home and  pain is reasonably controlled  Consultants: Palliative  care, radiation oncology,oncology  Procedures:None  Antimicrobials:  Anti-infectives (From admission, onward)    None       Subjective:  Feels as though her pain might be a little better today.  Still has difficulty getting around.  No bowel movements today.  Objective: Vitals:   01/25/22 1409 01/25/22 2128 01/26/22 0528 01/26/22 1251  BP: 125/64 135/62 136/70 (!) 133/59  Pulse: 88 80 77 79  Resp: 16 18 18 17   Temp: 98.3 F (36.8 C) 97.7 F (36.5 C) 97.8 F (36.6 C) 98.2 F (36.8 C)  TempSrc: Oral Oral Oral Oral  SpO2: 95% 95% 93% 94%  Weight:      Height:        Intake/Output Summary (Last 24 hours) at 01/26/2022 1943 Last data filed at 01/26/2022 0500 Gross per 24 hour  Intake --  Output 1450 ml  Net -1450 ml   Filed Weights   01/08/22 1923 01/19/22 1431  Weight: 79.4 kg 88.8 kg    Examination:  General exam: Overall comfortable, not in distress, deconditioned, weak HEENT: PERRL Respiratory system:  no wheezes or crackles  Cardiovascular system: S1 & S2 heard, RRR.  Gastrointestinal system: Abdomen is nondistended, soft and nontender. Central nervous system: Alert and oriented Extremities: No edema, no clubbing ,no cyanosis, tenderness on the left hip Skin: No rashes, no ulcers,no icterus   GU: Foley  Data Reviewed: I have personally reviewed following labs and imaging studies  CBC: Recent Labs  Lab 01/20/22 0805 01/24/22 0558  WBC 8.8 6.6  HGB 12.9 13.2  HCT 39.3 40.9  MCV 100.5* 101.5*  PLT 300 784   Basic Metabolic Panel: Recent Labs  Lab 01/20/22 0805 01/24/22 0558  NA 137 136  K 4.3 3.9  CL 102 98  CO2 27 32  GLUCOSE 126* 110*  BUN 26* 22  CREATININE 0.58 0.48  CALCIUM 9.2 9.0     No results found for this or any previous visit (from the past 240 hour(s)).   Radiology Studies: No results found.  Scheduled Meds:  ALPRAZolam  0.5 mg Oral QHS   apixaban  5 mg Oral  BID   celecoxib  200 mg Oral Daily   Chlorhexidine Gluconate Cloth  6 each Topical Daily   dexamethasone  2 mg Oral Daily   DULoxetine  40 mg Oral Daily   ezetimibe  10 mg Oral Daily   feeding supplement (KATE FARMS STANDARD 1.4)  325 mL Oral BID BM   hydrochlorothiazide  12.5 mg Oral Daily   lidocaine  1 patch Transdermal Q24H   methocarbamol  1,000 mg Oral TID   oxyCODONE  60 mg Oral Q12H   pantoprazole  20 mg Oral BID   polyethylene glycol  17 g Oral BID   pregabalin  50 mg Oral BID   senna-docusate  2 tablet Oral BID   tamsulosin  0.4 mg Oral QPC breakfast   Vitamin D (Ergocalciferol)  50,000 Units Oral Q7 days   Continuous Infusions:   LOS: 17 days   Kathie Dike, MD [facility] 01/26/2022, 7:43 PM

## 2022-01-27 ENCOUNTER — Encounter (HOSPITAL_COMMUNITY): Payer: Self-pay | Admitting: Hematology

## 2022-01-27 ENCOUNTER — Telehealth: Payer: Self-pay | Admitting: Hematology

## 2022-01-27 DIAGNOSIS — I8290 Acute embolism and thrombosis of unspecified vein: Secondary | ICD-10-CM | POA: Diagnosis not present

## 2022-01-27 DIAGNOSIS — C419 Malignant neoplasm of bone and articular cartilage, unspecified: Secondary | ICD-10-CM | POA: Diagnosis not present

## 2022-01-27 DIAGNOSIS — I10 Essential (primary) hypertension: Secondary | ICD-10-CM | POA: Diagnosis not present

## 2022-01-27 DIAGNOSIS — G08 Intracranial and intraspinal phlebitis and thrombophlebitis: Secondary | ICD-10-CM | POA: Diagnosis not present

## 2022-01-27 NOTE — Telephone Encounter (Signed)
Scheduled appt per 8/21 referral. Called pt, no answer. Left msg with appt date/time. Requested for pt to call back to confirm appt.

## 2022-01-27 NOTE — TOC Progression Note (Signed)
Transition of Care [facility]) - Progression Note    Patient Details  Name: Misty Decker MRN: [id number] Date of Birth: 10/16/1955  Transition of Care [facility]) CM/SW [place], LCSW Phone Number: 01/27/2022, 2:41 PM  Clinical Narrative:    CSW spoke with Tommi Rumps from [place] who has accepted pt for [facility] PT, OT RN, [place], and SW through [place]. A referral was sent to [facility] for a hospital bed. TOC to follow for d/c planning needs.     Expected Discharge Plan: [place] Barriers to Discharge: Continued Medical Work up  Expected Discharge Plan and Services Expected Discharge Plan: [place] In-house Referral: Clinical Social Work     Living arrangements for the past 2 months: Single Family Home                                       Social Determinants of Health (SDOH) Interventions    Readmission Risk Interventions     No data to display

## 2022-01-27 NOTE — Progress Notes (Signed)
Palliative:  HPI: 66 y.o. female  with past medical history of recent diagnosis of lung cancer with concern for spinal mets, sinus thrombus, HTN, HLD, fatty liver, fibromyalgia, depression admitted on 01/08/2022 with newly discovered sinus thrombus with increased pain due to new lung cancer with bone mets. She has began radiation therapy.    States that today is a better today as far as pain management.   Plan: Pain   Increase OxyIR 40 mg every 3 hours PRN. Cymbalta 40 mg daily for neuropathic pain as well as patient complaints of depression.  Spiritual care following.  celebrex  OxyContin 60 mg BID.  Continue heating pad.  Continue radiation therapy and steroids.    Bowel Regimen: Continue Senokot-S 2 tabs BID. Miralax daily.   Referral to outpatient palliative care at [place], NP recommended.  She is hopeful to pursue treatment. Full code for now, has follow up scheduled 02-10-22 with her oncologist.    Mod MDM  Loistine Chance MD Palliative Medicine Team   Team Phone [phone]    Greater than 50%  of this time was spent counseling and coordinating care related to the above assessment and plan

## 2022-01-27 NOTE — Progress Notes (Signed)
PROGRESS NOTE  Misty Decker  KXF:[id number] DOB: [birth year] DOA: 01/08/2022 PCP: Lorrene Reid, PA-C   Brief Narrative:  Patient is a 66 year old female with history of hypertension, hyperlipidemia, fibromyalgia, recent diagnosis of adenocarcinoma of lung with metastatic disease who was admitted for the management of sinus thrombosis as per outpatient MRI.  Patient was started on anticoagulation, neurology team also consulted.  She was subsequently transferred to [facility] from [facility] for palliative radiation to her hips.  Patient was also found to have metastatic bone lesions, lytic right femur lesion for which Ortho was consulted who recommended weightbearing as tolerated.  Hospital course remarkable for persistent pain, palliative care consulted and following for pain management, titrating pain medications.  Overall pain symptoms appear to be better.  Currently discharge planning, arranging DME to be delivered to home in anticipation of discharge home soon   Assessment & Plan:  Principal Problem:   VTE (venous thromboembolism) Active Problems:   Essential hypertension   Blood clots in brain   Primary malignant neoplasm of left lung metastatic to other site [facility])   Malignant neoplasm of bone with metastases [facility])   Counseling regarding advance care planning and goals of care  Straight sinus vein thrombosis: Found as per MRI brain done as an outpatient.  This is secondary to malignancy induced hypercoagulability.  Neurology was also consulted on admission.  Started on Lovenox, transitioned to [place]   Osteolytic bone lesions/persistent pain: Metastatic bone lesions.  Transferred to [facility] for radiation oncology treatment.  She completed radiation treatments on 01/23/2022. Found to have multiple metastatic lesions on the pelvis, proximal femur.  Orthopedics recommended weightbearing as tolerated with walker at all times, possible outpatient referral to Ortho oncology at tertiary care  center. Palliative medicine assisting in adjusting pain management. She is currently on oxycontin, oxycodone, celebrex, cymbalta and lyrica She has been ordered wheelchair, hospital bed and home health services including [facility] PT/OT/RN/aide/SW. Her daughter will be staying with her at home. Once DME has been set up at home, anticipate that she should be able to discharge home.  Metastatic lung cancer, adenocarcinoma: Recently underwent bronchoscopy with EBUS on 8/1 by pulmonology, biopsies performed.  Cytology consistent with adenocarcinoma.  She completed radiation treatment on 01/23/2022.  Continue pain management, supportive care.  She has a follow up appointment with oncology to discuss genetic testing and further treatment options Palliative care also consulted for goals of care.  Remains full code. Referral to orthopedic oncology at tertiary center can be considered as outpatient, based on treatment decisions after discussing with oncology  Hypertension: On hydrochlorothiazide  Hyperlipidemia: On Zetia. She does not have diabetes, no history of stroke, coronary artery disease or peripheral vascular disease. No indication to start on a statin to decrease LDL to less than 70  Fibromyalgia: Continue pain management, supportive care  Urinary retention: Foley catheter was placed, added Flomax.  Voiding trial attempted on 8/17 but she failed, so Foley reinserted.  She needs to follow-up with urology as an outpatient and discharge with Foley catheter  Obesity: BMI  of 34.6  Constipation: She is on opiates and is not very mobile, continue aggressive bowel regimen -Constipation now resolved,  Debility/deconditioning/position: PT/OT recommended home health on discharge.  Will likely need DME such as wheelchair, hospital bed, these have been ordered.  Nutrition Problem: Increased nutrient needs Etiology: cancer and cancer related treatments    DVT prophylaxis:lovenox apixaban (ELIQUIS) tablet 5 mg      Code Status: Full Code  Family Communication:  updated patient daughter Misty Decker over the phone  Patient status:Inpatient  Patient is from :Home  Anticipated discharge OI:BBCW with home health  Estimated DC date:Possible discharge in next few days if DME is set up at home and pain is reasonably controlled  Consultants: Palliative care, radiation oncology,oncology  Procedures:None  Antimicrobials:  Anti-infectives (From admission, onward)    None       Subjective:  Still has significant pain in back and hips/legs, but feels pain meds are helping  Objective: Vitals:   01/26/22 1251 01/26/22 2205 01/27/22 0557 01/27/22 1347  BP: (!) 133/59 124/60 133/64 122/62  Pulse: 79 80 87 81  Resp: 17 16 18 16   Temp: 98.2 F (36.8 C) 97.7 F (36.5 C) 98.1 F (36.7 C) (!) 97.4 F (36.3 C)  TempSrc: Oral Oral Oral Oral  SpO2: 94% 94% 94% 92%  Weight:      Height:        Intake/Output Summary (Last 24 hours) at 01/27/2022 1850 Last data filed at 01/27/2022 1812 Gross per 24 hour  Intake 830 ml  Output 2950 ml  Net -2120 ml   Filed Weights   01/08/22 1923 01/19/22 1431  Weight: 79.4 kg 88.8 kg    Examination:  General exam: Overall comfortable, not in distress, deconditioned, weak HEENT: PERRL Respiratory system:  no wheezes or crackles  Cardiovascular system: S1 & S2 heard, RRR.  Gastrointestinal system: Abdomen is nondistended, soft and nontender. Central nervous system: Alert and oriented Extremities: No edema, no clubbing ,no cyanosis, tenderness on the left hip Skin: No rashes, no ulcers,no icterus   GU: Foley  Data Reviewed: I have personally reviewed following labs and imaging studies  CBC: Recent Labs  Lab 01/24/22 0558  WBC 6.6  HGB 13.2  HCT 40.9  MCV 101.5*  PLT 888   Basic Metabolic Panel: Recent Labs  Lab 01/24/22 0558  NA 136  K 3.9  CL 98  CO2 32  GLUCOSE 110*  BUN 22  CREATININE 0.48  CALCIUM 9.0     No results  found for this or any previous visit (from the past 240 hour(s)).   Radiology Studies: No results found.  Scheduled Meds:  ALPRAZolam  0.5 mg Oral QHS   apixaban  5 mg Oral BID   celecoxib  200 mg Oral Daily   Chlorhexidine Gluconate Cloth  6 each Topical Daily   dexamethasone  2 mg Oral Daily   DULoxetine  40 mg Oral Daily   ezetimibe  10 mg Oral Daily   feeding supplement ([employer] STANDARD 1.4)  325 mL Oral BID BM   hydrochlorothiazide  12.5 mg Oral Daily   lidocaine  1 patch Transdermal Q24H   methocarbamol  1,000 mg Oral TID   oxyCODONE  60 mg Oral Q12H   pantoprazole  20 mg Oral BID   polyethylene glycol  17 g Oral BID   pregabalin  50 mg Oral BID   senna-docusate  2 tablet Oral BID   tamsulosin  0.4 mg Oral QPC breakfast   Vitamin D (Ergocalciferol)  50,000 Units Oral Q7 days   Continuous Infusions:   LOS: 18 days   Kathie Dike, MD [facility] 01/27/2022, 6:50 PM

## 2022-01-27 NOTE — Progress Notes (Signed)
Nutrition Follow-up  INTERVENTION:   -[employer] 1.4 PO BID, each provides 455 kcals and 20g protein  -RD to sign off, please consult with needs  NUTRITION DIAGNOSIS:   Increased nutrient needs related to cancer and cancer related treatments as evidenced by estimated needs.  Ongoing.  GOAL:   Patient will meet greater than or equal to 90% of their needs  Progressing.  MONITOR:   PO intake, Supplement acceptance, Labs, Weight trends, I & O's  ASSESSMENT:   66 year old with history of  HTN, HLD, fibromyalgia and recent diagnosis of lung mass with metastatic disease and outpatient MRI brain done which showed sinus thrombosis therefore admitted to the hospital.  Patient was started on anticoagulation, neurology team was consulted.  Patient was transferred to [facility] from [facility] for radiation oncology evaluation who placed [facility] 8/4 with plans for 10 radiation treatments per Dr Tammi Klippel. Noted allergy to beef and pork d/t alpha gal.  Patient currently consuming 100% of meals. Accepting [employer] [place] 100% as well.  Admission weight: 195 lbs.  Medications: Miralax, Senokot, Vitamin D  Labs reviewed.  Diet Order:   Diet Order             Diet Heart Room service appropriate? Yes; Fluid consistency: Thin  Diet effective now                   EDUCATION NEEDS:   No education needs have been identified at this time  Skin:  Skin Assessment: Reviewed RN Assessment  Last BM:  8/21 -type 4  Height:   Ht Readings from Last 1 Encounters:  01/08/22 5\' 3"  (1.6 m)    Weight:   Wt Readings from Last 1 Encounters:  01/19/22 88.8 kg    BMI:  Body mass index is 34.68 kg/m.  Estimated Nutritional Needs:   Kcal:  1600-1800  Protein:  75-90g  Fluid:  1.8L/day  Clayton Bibles, MS, RD, LDN Inpatient Clinical Dietitian Contact information available via Amion

## 2022-01-27 NOTE — Progress Notes (Signed)
Chaplain follow-up with Nylani who was joined by her two sisters today.  Henretta still had questions around understanding if she has made progress or not.  She wants the team, and specifically her daughters, to recognize that she is trying and doing all that she can.  Chaplain continues to affirm that the medical team knows she is doing all that she can.  Chaplain also promotes having candid conversations with her daughters around her capacity.   Chaplain offers presence, listening, and support.    01/27/22 1500  Clinical Encounter Type  Visited With Patient and family together  Visit Type Follow-up

## 2022-01-28 ENCOUNTER — Other Ambulatory Visit: Payer: Self-pay | Admitting: Nurse Practitioner

## 2022-01-28 ENCOUNTER — Other Ambulatory Visit (HOSPITAL_COMMUNITY): Payer: Self-pay

## 2022-01-28 DIAGNOSIS — G893 Neoplasm related pain (acute) (chronic): Secondary | ICD-10-CM

## 2022-01-28 DIAGNOSIS — I8290 Acute embolism and thrombosis of unspecified vein: Secondary | ICD-10-CM | POA: Diagnosis not present

## 2022-01-28 DIAGNOSIS — C3492 Malignant neoplasm of unspecified part of left bronchus or lung: Secondary | ICD-10-CM

## 2022-01-28 DIAGNOSIS — Z515 Encounter for palliative care: Secondary | ICD-10-CM

## 2022-01-28 DIAGNOSIS — C419 Malignant neoplasm of bone and articular cartilage, unspecified: Secondary | ICD-10-CM | POA: Diagnosis not present

## 2022-01-28 LAB — CBC
HCT: 36.4 % (ref 36.0–46.0)
Hemoglobin: 12 g/dL (ref 12.0–15.0)
MCH: 33.9 pg (ref 26.0–34.0)
MCHC: 33 g/dL (ref 30.0–36.0)
MCV: 102.8 fL — ABNORMAL HIGH (ref 80.0–100.0)
Platelets: 215 10*3/uL (ref 150–400)
RBC: 3.54 MIL/uL — ABNORMAL LOW (ref 3.87–5.11)
RDW: 14.5 % (ref 11.5–15.5)
WBC: 4.1 10*3/uL (ref 4.0–10.5)
nRBC: 0 % (ref 0.0–0.2)

## 2022-01-28 LAB — BASIC METABOLIC PANEL
Anion gap: 7 (ref 5–15)
BUN: 17 mg/dL (ref 8–23)
CO2: 27 mmol/L (ref 22–32)
Calcium: 7.2 mg/dL — ABNORMAL LOW (ref 8.9–10.3)
Chloride: 104 mmol/L (ref 98–111)
Creatinine, Ser: 0.39 mg/dL — ABNORMAL LOW (ref 0.44–1.00)
GFR, Estimated: >60 mL/min (ref >60)
Glucose, Bld: 127 mg/dL — ABNORMAL HIGH (ref 70–99)
Potassium: 3.5 mmol/L (ref 3.5–5.1)
Sodium: 138 mmol/L (ref 135–145)

## 2022-01-28 MED ORDER — OXYCODONE HCL ER 60 MG PO T12A: 60.0000 mg | EXTENDED_RELEASE_TABLET | Freq: Two times a day (BID) | ORAL | 0 refills | Status: DC

## 2022-01-28 MED ORDER — TAMSULOSIN HCL 0.4 MG PO CAPS: 0.4000 mg | ORAL_CAPSULE | Freq: Every day | ORAL | 0 refills | Status: DC

## 2022-01-28 MED ORDER — KATE FARMS STANDARD 1.4 PO LIQD: 325.0000 mL | Freq: Two times a day (BID) | ORAL | Status: AC

## 2022-01-28 MED ORDER — DEXAMETHASONE 2 MG PO TABS: 2.0000 mg | ORAL_TABLET | Freq: Every day | ORAL | 0 refills | Status: AC

## 2022-01-28 MED ORDER — SENNOSIDES-DOCUSATE SODIUM 8.6-50 MG PO TABS: 2.0000 | ORAL_TABLET | Freq: Two times a day (BID) | ORAL | 1 refills | Status: DC

## 2022-01-28 MED ORDER — BISACODYL 10 MG RE SUPP: 10.0000 mg | Freq: Every day | RECTAL | 0 refills | Status: AC | PRN

## 2022-01-28 MED ORDER — DULOXETINE HCL 40 MG PO CPEP: 40.0000 mg | ORAL_CAPSULE | Freq: Every day | ORAL | 0 refills | Status: DC

## 2022-01-28 MED ORDER — ALPRAZOLAM 0.5 MG PO TABS: 0.5000 mg | ORAL_TABLET | Freq: Every day | ORAL | 0 refills | Status: DC

## 2022-01-28 MED ORDER — METHOCARBAMOL 1000 MG PO TABS: 1000.0000 mg | ORAL_TABLET | Freq: Three times a day (TID) | ORAL | 0 refills | Status: DC

## 2022-01-28 MED ORDER — OXYCODONE HCL 20 MG PO TABS
40.0000 mg | ORAL_TABLET | ORAL | 0 refills | Status: DC | PRN
  Filled 2022-01-28: qty 60, 10d supply, fill #0

## 2022-01-28 MED ORDER — XTAMPZA ER 27 MG PO C12A
54.0000 mg | EXTENDED_RELEASE_CAPSULE | Freq: Two times a day (BID) | ORAL | 0 refills | Status: DC
Start: 2022-01-28 — End: 2022-02-10
  Filled 2022-01-28: qty 20, 5d supply, fill #0
  Filled 2022-01-28: qty 40, 10d supply, fill #0

## 2022-01-28 MED ORDER — LIDOCAINE 5 % EX PTCH: 1.0000 | MEDICATED_PATCH | CUTANEOUS | 0 refills | Status: DC

## 2022-01-28 MED ORDER — PREGABALIN 50 MG PO CAPS: 50.0000 mg | ORAL_CAPSULE | Freq: Two times a day (BID) | ORAL | 0 refills | Status: DC

## 2022-01-28 MED ORDER — POLYETHYLENE GLYCOL 3350 17 G PO PACK
17.0000 g | PACK | Freq: Two times a day (BID) | ORAL | 1 refills | Status: AC
Start: 2022-01-28 — End: ?

## 2022-01-28 MED ORDER — OXYCODONE HCL ER 60 MG PO T12A
60.0000 mg | EXTENDED_RELEASE_TABLET | Freq: Two times a day (BID) | ORAL | 0 refills | Status: DC
  Filled 2022-01-28: qty 30, 15d supply, fill #0

## 2022-01-28 MED ORDER — PANTOPRAZOLE SODIUM 20 MG PO TBEC: 20.0000 mg | DELAYED_RELEASE_TABLET | Freq: Two times a day (BID) | ORAL | 1 refills | Status: DC

## 2022-01-28 MED ORDER — CELECOXIB 200 MG PO CAPS: 200.0000 mg | ORAL_CAPSULE | Freq: Every day | ORAL | 0 refills | Status: DC

## 2022-01-28 MED ORDER — APIXABAN 5 MG PO TABS: 5.0000 mg | ORAL_TABLET | Freq: Two times a day (BID) | ORAL | 1 refills | Status: DC

## 2022-01-28 MED ORDER — VITAMIN D (ERGOCALCIFEROL) 1.25 MG (50000 UNIT) PO CAPS: 50000.0000 [IU] | ORAL_CAPSULE | ORAL | 0 refills | Status: AC

## 2022-01-28 MED ORDER — OXYCODONE HCL 20 MG PO TABS
40.0000 mg | ORAL_TABLET | ORAL | 0 refills | Status: DC | PRN
Start: 2022-01-28 — End: 2022-01-28

## 2022-01-28 NOTE — Discharge Summary (Addendum)
Physician Discharge Summary  Misty Decker [place] PHX:[id number] DOB: 1955-06-30  PCP: Lorrene Reid, PA-C  Admitted from: Home Discharged to: Home  Admit date: 01/08/2022 Discharge date: 01/28/2022  Recommendations for Outpatient Follow-up:    Follow-up Information     Garvin Fila, MD. Schedule an appointment as soon as possible for a visit in 1 month(s).   Specialties: Neurology, Radiology Why: stroke clinic Contact information: [address] [place] [address] [phone]         Lorrene Reid, [place]. Schedule an appointment as soon as possible for a visit in 1 week(s).   Specialty: Physician Assistant Why: To be seen with repeat labs (CBC, CMP & magnesium). Contact information: [place] [address] [phone]         Brunetta Genera, MD Follow up on 02/10/2022.   Specialties: Hematology, Oncology Why: 2:30 PM. Contact information: [place] [address] [phone]         Pickenpack-Cousar, [place], NP. Schedule an appointment as soon as possible for a visit.   Specialty: Nurse Practitioner Why: To be seen at the same time of her oncology visit 02/10/2022.  Oncology team will coordinate. Contact information: [address]  [place] [address] [phone]         [place]. Schedule an appointment as soon as possible for a visit.   Why: To be seen in 1 to 2 weeks for management of acute urinary retention, has indwelling Foley catheter. Contact information: [place] [phone]                 Home Health: Home Health Orders (From admission, onward)     Start     Ordered   01/26/22 [place]  At discharge       Question Answer Comment  To provide the following care/treatments PT   To provide the following care/treatments OT   To provide the following care/treatments RN   To  provide the following care/treatments [place]   To provide the following care/treatments Social work      01/26/22 1943             Equipment/Devices:     [place]  (From admission, onward)           Start     Ordered   01/27/22 1849  For home use only DME standard manual wheelchair with seat cushion  Once       Comments: Patient suffers from hip pain which impairs their ability to perform daily activities like walking in the home.  A walker will not resolve issue with performing activities of daily living. A wheelchair will allow patient to safely perform daily activities. Patient can safely propel the wheelchair in the home or has a caregiver who can provide assistance. Length of need lifetime. Accessories: elevating leg rests (ELRs), wheel locks, extensions and anti-tippers.   01/27/22 1849   01/26/22 1943  For home use only DME Hospital bed  Once       Question Answer Comment  Length of Need Lifetime   Patient has (list medical condition): metastatic cancer   The above medical condition requires: Patient requires the ability to reposition frequently   Head must be elevated greater than: 30 degrees   Bed type Semi-electric      01/26/22 1943  Discharge Condition: Improved and stable.   Code Status: Full Code Diet recommendation:  Discharge Diet Orders (From admission, onward)     Start     Ordered   01/28/22 0000  Diet - low sodium heart healthy        01/28/22 1348             Discharge Diagnoses:  Principal Problem:   VTE (venous thromboembolism) Active Problems:   Essential hypertension   Blood clots in brain   Primary malignant neoplasm of left lung metastatic to other site [name])   Malignant neoplasm of bone with metastases ([name])   Counseling regarding advance care planning and goals of care   Brief Summary: Patient is a 66 year old female with history of hypertension, hyperlipidemia, fibromyalgia, recent  diagnosis of adenocarcinoma of lung with metastatic disease who was admitted for the management of sinus thrombosis as per outpatient MRI.  Patient was started on anticoagulation, neurology team also consulted.  She was subsequently transferred to [facility] from [facility] for palliative radiation to her hips.  Patient was also found to have metastatic bone lesions, lytic right femur lesion for which Ortho was consulted who recommended weightbearing as tolerated.  Hospital course remarkable for persistent pain, palliative care consulted and following for pain management, titrating pain medications.  Overall pain symptoms appear to be better.  TOC has arranged DME and home health services.     Assessment & Plan:   Straight sinus vein thrombosis: Found as per MRI brain done as an outpatient.  This is secondary to malignancy induced hypercoagulability.  Neurology was also consulted on admission.  Started on Lovenox, transitioned to [place]    Osteolytic bone lesions/persistent pain: Metastatic bone lesions.  Transferred to [name] for radiation oncology treatment.  She completed radiation treatments on 01/23/2022. Found to have multiple metastatic lesions on the pelvis, proximal femur.  Orthopedics recommended weightbearing as tolerated with walker at all times, possible outpatient referral to Ortho oncology at tertiary care center. Palliative medicine assisted in adjusting pain management. She is currently on oxycontin, oxycodone, celebrex, cymbalta and lyrica She has been ordered wheelchair, hospital bed and home health services including [name] PT/OT/RN/aide/SW. Her daughter will be staying with her at home.  As per communication with TOC team, all home health services and DME have been arranged.  As discussed with Dr. Rowe Pavy, PMT, recommends discharging on current multimodality pain regimen as noted below and she will coordinate with her team to have patient seen by the palliative care team at the cancer center when  she visits her oncologist on 02/10/2022.  Addendum: After her meds had been sent to her primary outpatient pharmacy, received communication but the pharmacy would not be able to fill her OxyContin.  Communicated at length with Dr. Rowe Pavy, who then changed her OxyContin to [name].  She called a prescription for Xtampza and oxycodone IR to the [facility] outpatient pharmacy.   Metastatic lung cancer, adenocarcinoma: Recently underwent bronchoscopy with EBUS on 8/1 by pulmonology, biopsies performed.  Cytology consistent with adenocarcinoma.  She completed radiation treatment on 01/23/2022.  Continue pain management, supportive care.  She has a follow up appointment with oncology to discuss genetic testing and further treatment options Palliative care also consulted for goals of care.  Remains full code. Referral to orthopedic oncology at tertiary center can be considered as outpatient, based on treatment decisions after discussing with oncology   Hypertension: On hydrochlorothiazide   Hyperlipidemia: On Zetia. She does not have diabetes, no  history of stroke, coronary artery disease or peripheral vascular disease. No indication to start on a statin to decrease LDL to less than 70   Fibromyalgia: Continue pain management, supportive care   Urinary retention: Foley catheter was placed, added Flomax.  Voiding trial attempted on 8/17 but she failed, so Foley reinserted.  She needs to follow-up with urology as an outpatient and discharge with Foley catheter   Obesity: BMI  of 34.6   Constipation: She is on opiates and is not very mobile, continue aggressive bowel regimen -Constipation now resolved,   Debility/deconditioning/position: PT/OT recommended home health on discharge.  Will likely need DME such as wheelchair, hospital bed, these have been ordered.   Nutrition Problem: Increased nutrient needs Etiology: cancer and cancer related treatments   Consultations: Palliative care  medicine, radiation oncology, medical oncology.  Procedures: None.   Discharge Instructions  Discharge Instructions     Ambulatory referral to Neurology   Complete by: As directed    Follow up with Dr. Leonie Man at [facility] in 4-6 weeks. Too complicated for RN to follow. Thanks.   Call MD for:  difficulty breathing, headache or visual disturbances   Complete by: As directed    Call MD for:  extreme fatigue   Complete by: As directed    Call MD for:  persistant dizziness or light-headedness   Complete by: As directed    Call MD for:  persistant nausea and vomiting   Complete by: As directed    Call MD for:  severe uncontrolled pain   Complete by: As directed    Call MD for:  temperature >100.4   Complete by: As directed    Diet - low sodium heart healthy   Complete by: As directed    Increase activity slowly   Complete by: As directed         Medication List     STOP taking these medications    HYDROcodone-acetaminophen 5-325 MG tablet Commonly known as: NORCO/VICODIN   oxyCODONE-acetaminophen 5-325 MG tablet Commonly known as: PERCOCET/ROXICET       TAKE these medications    ALPRAZolam 0.5 MG tablet Commonly known as: XANAX Take 1 tablet (0.5 mg total) by mouth at bedtime.   apixaban 5 MG Tabs tablet Commonly known as: ELIQUIS Take 1 tablet (5 mg total) by mouth 2 (two) times daily.   bisacodyl 10 MG suppository Commonly known as: DULCOLAX Place 1 suppository (10 mg total) rectally daily as needed for severe constipation.   CAL MAG ZINC +D3 PO Take 1 tablet by mouth 3 (three) times daily.   celecoxib 200 MG capsule Commonly known as: CELEBREX Take 1 capsule (200 mg total) by mouth daily. Start taking on: January 29, 2022   dexamethasone 2 MG tablet Commonly known as: DECADRON Take 1 tablet (2 mg total) by mouth daily. Start taking on: January 29, 2022   diphenhydrAMINE 25 MG tablet Commonly known as: BENADRYL Take 25 mg by mouth daily as needed for  allergies.   DULoxetine HCl 40 MG Cpep Take 40 mg by mouth daily. Start taking on: January 29, 2022   ezetimibe 10 MG tablet Commonly known as: ZETIA TAKE 1 TABLET (10 MG TOTAL) BY MOUTH DAILY.   feeding supplement (KATE FARMS STANDARD 1.4) Liqd liquid Take 325 mLs by mouth 2 (two) times daily between meals.   hydrochlorothiazide 12.5 MG tablet Commonly known as: HYDRODIURIL TAKE 1 TABLET (12.5 MG TOTAL) BY MOUTH DAILY.   lidocaine 5 % Commonly known as: LIDODERM  Place 1 patch onto the skin daily. Remove & Discard patch within 12 hours or as directed by MD Start taking on: January 29, 2022   Methocarbamol 1000 MG Tabs Take 1,000 mg by mouth 3 (three) times daily.   multivitamin capsule Take 1 capsule by mouth daily.   Oxycodone HCl 20 MG Tabs Take 2 tablets (40 mg total) by mouth every 4 (four) hours as needed.   pantoprazole 20 MG tablet Commonly known as: PROTONIX Take 1 tablet (20 mg total) by mouth 2 (two) times daily.   polyethylene glycol 17 g packet Commonly known as: MIRALAX / GLYCOLAX Take 17 g by mouth 2 (two) times daily.   pregabalin 50 MG capsule Commonly known as: LYRICA Take 1 capsule (50 mg total) by mouth 2 (two) times daily.   senna-docusate 8.6-50 MG tablet Commonly known as: Senokot-S Take 2 tablets by mouth 2 (two) times daily.   tamsulosin 0.4 MG Caps capsule Commonly known as: FLOMAX Take 1 capsule (0.4 mg total) by mouth daily after breakfast. Start taking on: January 29, 2022   Vitamin D (Ergocalciferol) 1.25 MG (50000 UNIT) Caps capsule Commonly known as: DRISDOL Take 1 capsule (50,000 Units total) by mouth every 7 (seven) days. Start taking on: February 03, 2022   Xtampza ER 27 MG C12a Generic drug: oxyCODONE ER Take 2 capsules by mouth in the morning and at bedtime.        Allergies  Allergen Reactions   Codeine     Nausea and vomiting, hallucinations, bad dreams    Morphine Shortness Of Breath    B/P dropped!    Beef-Derived Products Nausea And Vomiting    Alpha Gal   Doxycycline Nausea And Vomiting   Ibuprofen     Stomach Ulcers, stomach scarred    Iodinated Contrast Media Nausea And Vomiting    Pt reports n/v only. No other reaction noted. No need for 13 hr prep.    Latex Rash   Nsaids Other (See Comments)    PT has hx of ulcers.   Penicillins Nausea And Vomiting    hives   Pork-Derived Products Nausea And Vomiting   Tramadol     Caused RLS   Gabapentin Other (See Comments)    Pt states this made her feel like she was always depressed and not caring about anything      Procedures/Studies: CT FEMUR LEFT WO CONTRAST  Result Date: 01/09/2022 CLINICAL DATA:  Metastatic disease evaluation. Bilateral femur. Left leg pain. Primary bronchogenic carcinoma. EXAM: CT OF THE LOWER LEFT EXTREMITY WITHOUT CONTRAST TECHNIQUE: Multidetector CT imaging of the lower left extremity was performed according to the standard protocol. RADIATION DOSE REDUCTION: This exam was performed according to the departmental dose-optimization program which includes automated exposure control, adjustment of the mA and/or kV according to patient size and/or use of iterative reconstruction technique. COMPARISON:  PET-CT 01/05/2022 FINDINGS: Bones/Joint/Cartilage Note is made on recent PET-CT of numerous hypermetabolic lytic bone metastases within the axial and appendicular skeleton. On the current CT, there are numerous lytic lesions seen throughout the visualized pelvis. A reference predominantly lytic lesion within the posterior left ilium was more completely seen on recent PET-CT and measures up to approximately 2.5 x 5.0 cm in greatest transverse by AP dimensions (axial series 3, image 1). There is high-grade thinning of the posteromedial iliac cortex (axial image 1). Superior lateral left acetabular subcortical lytic lesion measures up to approximately 1.1 x 1.8 x 1.9 cm (transverse by AP by craniocaudal) with abutment  of the  lateral cortex but no definitive breakthrough (axial series 2, image 32 and coronal series 7, image 56). There areas of patchy lytic lucency sclerosis within the left femoral head suspicious for metastatic disease involvement. Within the limitations of diffuse decreased bone mineralization, no definitive destructive lytic lesion is seen within the left intertrochanteric more distal left femur. Moderate medial compartment of the knee joint space narrowing with subchondral sclerosis and tiny peripheral medial ossicle degenerative change. Ligaments Suboptimally assessed by CT. Muscles and Tendons Mild fatty infiltration of the proximal anterior left gluteus minimus muscle body. No gross tendon tear is seen within the visualized left thigh. Soft tissues No knee joint effusion.  No soft tissue fluid collection. IMPRESSION: 1. Numerous lytic metastases within the visualized pelvis as on prior PET-CT. 2. Dominant partially visualized posterior left iliac lesion that was hypermetabolic on prior PET-CT measures up to approximately 2.5 x 5.0 cm in greatest transverse by AP dimensions. This causes high-grade thinning of the posteromedial iliac cortex. 3. Smaller superolateral left acetabular 1.9 cm lytic lesion abuts the lateral cortex without definite cortical breakthrough. Electronically Signed   By: Yvonne Kendall M.D.   On: 01/09/2022 13:51   CT FEMUR RIGHT WO CONTRAST  Result Date: 01/09/2022 CLINICAL DATA:  Metastatic disease evaluation. Bilateral femur. Left leg pain. Primary bronchogenic carcinoma. EXAM: CT OF THE LOWER RIGHT EXTREMITY WITHOUT CONTRAST TECHNIQUE: Multidetector CT imaging of the right lower extremity was performed according to the standard protocol. RADIATION DOSE REDUCTION: This exam was performed according to the departmental dose-optimization program which includes automated exposure control, adjustment of the mA and/or kV according to patient size and/or use of iterative reconstruction technique.  COMPARISON:  PET-CT through the bilateral proximal femurs 01/05/2022 FINDINGS: Bones/Joint/Cartilage There again diffuse lytic metastases, seen on prior recent PET-CT to be hypermetabolic and to involve the appendicular axial skeleton. Reference lytic lesions include right iliac 1.7 x 1.3 cm lytic lesion (axial series 3, image 8) causing thinning of the anterior iliac cortex (greatest on axial series 2, image 6 where there is likely minimal pathologic breakthrough of the cortex). Right distal femoral neck and intertrochanteric to lesser trochanteric region of the right femur lucent lesion measuring up to approximately 4.1 by 2.8 by 5.1 cm (transverse by AP by craniocaudal, axial series 2, image 92 and coronal series 7, image 76). This thins the posterior distal femoral neck and the posterior lesser trochanteric cortex (axial series 3, images 84 through 91 with areas of complete cortical breakthrough. No definitive metastatic lesion is seen within the more distal right femur, however diffuse decreased bone mineralization does limit sensitivity. Ligaments Suboptimally assessed by CT. Muscles and Tendons Normal density in size of the region of the right thigh musculature. No gross tendon tear is seen. Soft tissues No soft tissue fluid collection is seen. Mild atherosclerotic calcifications are partially visualized within the pelvis. IMPRESSION: 1. Diffuse lytic metastases are again seen throughout the pelvis and proximal right femur. 2. Dominant right distal inferior femoral neck, intertrochanteric and lesser trochanteric lytic lesion again thins the medial cortex and appears to minimally breakthrough the posterior lesser trochanteric cortex. This increases susceptibility for risk of pathologic fracture of the Mirels classification system given lower limb location, trochanteric region location, involving greater than 2/3 of the transverse bone diameter, and lytic quality. Recommend clinical evaluation for possible  functional pain to complete this Mirels classification assessment. Electronically Signed   By: Yvonne Kendall M.D.   On: 01/09/2022 13:41   CT Head Wo  Contrast  Result Date: 01/08/2022 CLINICAL DATA:  Frontal headache, dural venous sinus thrombosis suspected EXAM: CT HEAD WITHOUT CONTRAST TECHNIQUE: Contiguous axial images were obtained from the base of the skull through the vertex without intravenous contrast. RADIATION DOSE REDUCTION: This exam was performed according to the departmental dose-optimization program which includes automated exposure control, adjustment of the mA and/or kV according to patient size and/or use of iterative reconstruction technique. COMPARISON:  MRI earlier today FINDINGS: Brain: No intracranial hemorrhage, mass effect, or evidence of acute infarct. No hydrocephalus. No extra-axial fluid collection. Vascular: Hyperdensity within the straight sinus (series 7/image 29) compatible with thrombosis as seen on MRI earlier today. Skull: No fracture or focal lesion. Sinuses/Orbits: No acute finding. Paranasal sinuses and mastoid air cells are well aerated. Other: None. IMPRESSION: Hyperdensity in the straight sinus compatible with thrombus as seen on MRI earlier today. No evidence of hemorrhage or acute infarct. Electronically Signed   By: Placido Sou M.D.   On: 01/08/2022 18:51   MR BRAIN W WO CONTRAST  Result Date: 01/08/2022 CLINICAL DATA:  Non-small cell lung cancer (NSCLC), staging EXAM: MRI HEAD WITHOUT AND WITH CONTRAST TECHNIQUE: Multiplanar, multiecho pulse sequences of the brain and surrounding structures were obtained without and with intravenous contrast. CONTRAST:  61mL GADAVIST GADOBUTROL 1 MMOL/ML IV SOLN COMPARISON:  None Available. FINDINGS: Brain: There is no acute infarction or intracranial hemorrhage. There is no intracranial mass, mass effect, or edema. There is no hydrocephalus or extra-axial fluid collection. Ventricles and sulci are within normal limits in size  and configuration. Minimal punctate foci of T2 hyperintensity in the supratentorial white matter nonspecific but may reflect minor chronic microvascular ischemic changes. No abnormal enhancement. Vascular: There is thrombus within the proximal to mid straight sinus. Remainder of dural venous sinuses are patent. Major vessel flow voids at the skull base are preserved. Skull and upper cervical spine: Normal marrow signal is preserved. Partially imaged susceptibility artifact related to cervical spine fusion. Sinuses/Orbits: Paranasal sinuses are aerated. Orbits are unremarkable. Other: Sella is unremarkable.  Mastoid air cells are clear. IMPRESSION: Thrombus within the straight sinus. No evidence of intracranial metastatic disease. These results will be called to the ordering clinician or representative by the Radiologist Assistant, and communication documented in the PACS or [employer]. Electronically Signed   By: Macy Mis M.D.   On: 01/08/2022 11:55   NM PET Image Initial (PI) Skull Base To Thigh (F-18 FDG)  Result Date: 01/05/2022 CLINICAL DATA:  Initial treatment strategy for non-small cell lung cancer. EXAM: NUCLEAR MEDICINE PET SKULL BASE TO THIGH TECHNIQUE: 8.74 mCi F-18 FDG was injected intravenously. Full-ring PET imaging was performed from the skull base to thigh after the radiotracer. CT data was obtained and used for attenuation correction and anatomic localization. Fasting blood glucose: 136 mg/dl COMPARISON:  CT 12/26/2021 FINDINGS: Mediastinal blood pool activity: SUV max 3.25 Liver activity: SUV max NA NECK: No hypermetabolic lymph nodes in the neck. Incidental CT findings: none CHEST: There is a mass within the superior segment of the left lower lobe which measures 5.1 x 3.7 cm within SUV max of 18.24, image 37/7. Left upper lobe pulmonary nodule measures 8 mm and has an SUV max 3.82, image 28/7. Part solid nodule within the apical segment of the left upper lobe measures 2.3 cm with a  4 mm peripheral solid component, image 12/7. This has an SUV max 1.98. FDG avid left hilar, AP window, subcarinal, and right paratracheal lymph nodes identified. The index right paratracheal  lymph node measures 9 mm with SUV max of 10.11, image 68/4. The subcarinal lymph node measures 8 mm with SUV max of 12.37, image 72/4. Left hilar lymph node has an SUV max 11.44. Incidental CT findings: Aortic atherosclerosis. No pericardial effusion. No pleural effusion. ABDOMEN/PELVIS: There is no abnormal FDG uptake within the liver, pancreas, or spleen. Mild asymmetric increased uptake within the left adrenal gland corresponds to a tiny nodule measuring 6 mm with SUV max of 5.31, image 111/4. No tracer avid abdominopelvic lymph nodes identified. Incidental CT findings: Aortic atherosclerosis. Moderate size hiatal hernia identified. SKELETON: Extensive tracer avid lytic bone metastases are identified involving the proximal appendicular and axial skeleton. Lesions are too numerous to count. Of potential orthopedic significance is a large tracer avid lytic lesion involving the proximal right femur and lesser trochanter with SUV max of 13.11, image 188/4. Lytic lesion involving the posterior aspect of the left iliac bone measures 4 cm with SUV max of 14.58, image 158/4. extensive bilateral ribs and spinal metastases are noted. Metastatic lesion with pathologic compression deformity is noted L2 vertebral body with SUV max 11.93. Here, tumor extends into the left posterior elements with mass effect upon the L1-2 and L2-3 neural foramina. Incidental CT findings: none IMPRESSION: 1. Left lower lobe lung mass is intensely FDG avid compatible with primary bronchogenic carcinoma. 2. Left upper lobe pulmonary nodule suspicious for pulmonary metastasis. 3. FDG avid ipsilateral hilar, ipsilateral mediastinal, subcarinal, and contralateral right mediastinal nodal metastasis. 4. Widespread FDG avid lytic bone metastasis. Lytic lesion within  the proximal right femur may be of orthopedic significance. 5. Part solid nodule with low level FDG uptake is identified within the apical segment of the left upper lobe. Cannot exclude indolent pulmonary neoplasm such as adenocarcinoma. 6. Tiny nodule in the left adrenal gland is FDG avid. Early left adrenal metastasis cannot be excluded. Electronically Signed   By: Kerby Moors M.D.   On: 01/05/2022 16:21      Subjective: Patient reports that overall she feels better, pain is much better controlled.  Having BMs.  Tolerating diet.  No other complaints reported.  Agreeable to discharge home today.  Discharge Exam:  Vitals:   01/27/22 1347 01/27/22 2012 01/28/22 0551 01/28/22 1331  BP: 122/62 128/69 114/71 118/62  Pulse: 81 89 80 79  Resp: 16 18 18 16   Temp: (!) 97.4 F (36.3 C) 98.2 F (36.8 C) 98.4 F (36.9 C) 98.5 F (36.9 C)  TempSrc: Oral Oral Oral Oral  SpO2: 92% 95% 94% 91%  Weight:      Height:        General: Middle-age female, moderately built and nourished sitting up comfortably in bed without distress. Cardiovascular: S1 & S2 heard, RRR, S1/S2 +. No murmurs, rubs, gallops or clicks. No JVD or pedal edema.  Telemetry personally reviewed: Sinus rhythm. Respiratory: Clear to auscultation without wheezing, rhonchi or crackles. No increased work of breathing. Abdominal:  Non distended, non tender & soft. No organomegaly or masses appreciated. Normal bowel sounds heard. CNS: Alert and oriented. No focal deficits. Extremities: no edema, no cyanosis    The results of significant diagnostics from this hospitalization (including imaging, microbiology, ancillary and laboratory) are listed below for reference.     Microbiology: No results found for this or any previous visit (from the past 240 hour(s)).   Labs: CBC: Recent Labs  Lab 01/24/22 0558 01/28/22 0649  WBC 6.6 4.1  HGB 13.2 12.0  HCT 40.9 36.4  MCV 101.5* 102.8*  PLT 262 956    Basic Metabolic  Panel: Recent Labs  Lab 01/24/22 0558 01/28/22 0649  NA 136 138  K 3.9 3.5  CL 98 104  CO2 32 27  GLUCOSE 110* 127*  BUN 22 17  CREATININE 0.48 0.39*  CALCIUM 9.0 7.2*     Urinalysis    Component Value Date/Time   COLORURINE YELLOW 02/08/2010 2215   Victor 02/08/2010 2215   LABSPEC 1.016 02/08/2010 2215   PHURINE 7.0 02/08/2010 2215   GLUCOSEU NEGATIVE 02/08/2010 2215   HGBUR NEGATIVE 02/08/2010 2215   BILIRUBINUR NEGATIVE 02/08/2010 2215   KETONESUR NEGATIVE 02/08/2010 2215   PROTEINUR NEGATIVE 02/08/2010 2215   UROBILINOGEN 0.2 02/08/2010 2215   NITRITE NEGATIVE 02/08/2010 2215   LEUKOCYTESUR  02/08/2010 2215    NEGATIVE MICROSCOPIC NOT DONE ON URINES WITH NEGATIVE PROTEIN, BLOOD, LEUKOCYTES, NITRITE, OR GLUCOSE <1000 mg/dL.    I spoke with patient's daughter in detail, updated care and answered all questions.  Time coordinating discharge: 35 minutes  SIGNED:  Vernell Leep, MD,  FACP, [facility], [facility], [facility] (Care Management Physician Certified). [facility]  To contact the attending provider between 7A-7P or the covering provider during after hours 7P-7A, please log into the web site [email or web address] and access using universal [place] password for that web site. If you do not have the password, please call the hospital operator.

## 2022-01-28 NOTE — Progress Notes (Signed)
Palliative:  HPI: 66 y.o. female  with past medical history of recent diagnosis of lung cancer with concern for spinal mets, sinus thrombus, HTN, HLD, fatty liver, fibromyalgia, depression admitted on 01/08/2022 with newly discovered sinus thrombus with increased pain due to new lung cancer with bone mets. She has began radiation therapy.     Discussed with patient and [facility] MD, planning for discharge home today.   Plan: Pain    OxyIR 40 mg every 4 hours PRN. Cymbalta 40 mg daily for neuropathic pain as well as patient complaints of depression.  Spiritual care following.  celebrex  Xtampza 54 mg PO BID   Bowel Regimen: Continue Senokot-S 2 tabs BID. Miralax daily.   Referral to outpatient palliative care at [facility], NP has been made.  She is hopeful to pursue treatment. Full code for now, has follow up scheduled 02-10-22 with her oncologist.    Mod MDM  Loistine Chance MD Palliative Medicine Team   Team Phone [phone]    Greater than 50%  of this time was spent counseling and coordinating care related to the above assessment and plan

## 2022-01-30 ENCOUNTER — Other Ambulatory Visit (HOSPITAL_COMMUNITY): Payer: Self-pay

## 2022-02-03 ENCOUNTER — Encounter: Payer: Self-pay | Admitting: Hematology

## 2022-02-03 ENCOUNTER — Encounter: Payer: Self-pay | Admitting: Pulmonary Disease

## 2022-02-03 ENCOUNTER — Encounter (HOSPITAL_COMMUNITY): Payer: Self-pay | Admitting: Hematology

## 2022-02-04 ENCOUNTER — Other Ambulatory Visit: Payer: Self-pay | Admitting: Nurse Practitioner

## 2022-02-04 ENCOUNTER — Ambulatory Visit (INDEPENDENT_AMBULATORY_CARE_PROVIDER_SITE_OTHER): Payer: Medicare Other | Admitting: Physician Assistant

## 2022-02-04 ENCOUNTER — Encounter: Payer: Self-pay | Admitting: Physician Assistant

## 2022-02-04 VITALS — BP 119/76 | HR 88 | Temp 97.7°F | Ht 63.0 in | Wt 195.0 lb

## 2022-02-04 DIAGNOSIS — Z09 Encounter for follow-up examination after completed treatment for conditions other than malignant neoplasm: Secondary | ICD-10-CM | POA: Diagnosis not present

## 2022-02-04 DIAGNOSIS — C3492 Malignant neoplasm of unspecified part of left bronchus or lung: Secondary | ICD-10-CM

## 2022-02-04 DIAGNOSIS — I8290 Acute embolism and thrombosis of unspecified vein: Secondary | ICD-10-CM | POA: Diagnosis not present

## 2022-02-04 DIAGNOSIS — F430 Acute stress reaction: Secondary | ICD-10-CM

## 2022-02-04 DIAGNOSIS — I10 Essential (primary) hypertension: Secondary | ICD-10-CM

## 2022-02-04 DIAGNOSIS — Z515 Encounter for palliative care: Secondary | ICD-10-CM

## 2022-02-04 DIAGNOSIS — G893 Neoplasm related pain (acute) (chronic): Secondary | ICD-10-CM

## 2022-02-04 DIAGNOSIS — E782 Mixed hyperlipidemia: Secondary | ICD-10-CM

## 2022-02-04 DIAGNOSIS — F410 Panic disorder [episodic paroxysmal anxiety] without agoraphobia: Secondary | ICD-10-CM

## 2022-02-04 DIAGNOSIS — C419 Malignant neoplasm of bone and articular cartilage, unspecified: Secondary | ICD-10-CM

## 2022-02-04 MED ORDER — OXYCODONE HCL 20 MG PO TABS: 40.0000 mg | ORAL_TABLET | ORAL | 0 refills | Status: DC | PRN

## 2022-02-04 NOTE — Telephone Encounter (Signed)
Good Afternoon. I am reaching out as when we left the hospital we were told they would be setting up genetic testing and marking. I was unsure if this was already scheduled and if not how and who do we schedule that with?  Is this something you are managing or is this for Oncology? Please advise?

## 2022-02-04 NOTE — Progress Notes (Signed)
Established patient visit   Patient: Misty Decker   DOB: [birth year]   66 y.o. Female  MRN: [id number] Visit Date: 02/04/2022  Chief Complaint  Patient presents with   Hospitalization Follow-up        Subjective    HPI HPI     Hospitalization Follow-up    Additional comments:        Last edited by Sylvester Harder, CMA on 02/04/2022  1:49 PM.      Patient presents for hospital follow-up. Patient is accompanied by her husband and sister. Reports gradually feeling better. States [place] home health came out yesterday for evaluation. Has a follow-up with [facility] next Thursday. Patient reports alprazolam has helped with a panic attack she had and situational anxiety.    Discharge summary:  Admitted from: Home Discharged to: Home   Admit date: 01/08/2022 Discharge date: 01/28/2022   Recommendations for Outpatient Follow-up:      Follow-up Information       Micki Riley, MD. Schedule an appointment as soon as possible for a visit in 1 month(s).   Specialties: Neurology, Radiology Why: stroke clinic Contact information: [address] [place] [address] [phone]              Mayer Masker, [place]. Schedule an appointment as soon as possible for a visit in 1 week(s).   Specialty: Physician Assistant Why: To be seen with repeat labs (CBC, CMP & magnesium). Contact information: [address] [place] [address] [phone]              Johney Maine, MD Follow up on 02/10/2022.   Specialties: Hematology, Oncology Why: 2:30 PM. Contact information: [address] [place] [address] [phone]              Glee Arvin, NP. Schedule an appointment as soon as possible for a visit.   Specialty: Nurse Practitioner Why: To be seen at the same time of her oncology visit 02/10/2022.  Oncology team will coordinate. Contact information: [address] [place]  [address] [phone]              [facility]. Schedule an appointment as soon as possible for a visit.   Why: To be seen in 1 to 2 weeks for management of acute urinary retention, has indwelling Foley catheter. Contact information: [address] [place] [address] [place] [address] [phone]                          Home Health: Home Health Orders (From admission, onward)        Start     Ordered    01/26/22 1942   Home Health  At discharge       Question Answer Comment  To provide the following care/treatments PT    To provide the following care/treatments OT    To provide the following care/treatments RN    To provide the following care/treatments Home Health Aide    To provide the following care/treatments Social work       01/26/22 1943                  Equipment/Devices:       Durable Medical Equipment  (From admission, onward)                 Start     Ordered  01/27/22 1849   For home use only DME standard manual wheelchair with seat cushion  Once       Comments: Patient suffers from hip pain which impairs their ability to perform daily activities like walking in the home.  A walker will not resolve issue with performing activities of daily living. A wheelchair will allow patient to safely perform daily activities. Patient can safely propel the wheelchair in the home or has a caregiver who can provide assistance. Length of need lifetime. Accessories: elevating leg rests (ELRs), wheel locks, extensions and anti-tippers.   01/27/22 1849    01/26/22 1943   For home use only DME Hospital bed  Once       Question Answer Comment  Length of Need Lifetime    Patient has (list medical condition): metastatic cancer    The above medical condition requires: Patient requires the ability to reposition frequently    Head must be elevated greater than: 30 degrees    Bed type Semi-electric       01/26/22 1943                   Discharge Condition: Improved and stable.   Code Status: Full Code Diet recommendation:  Discharge Diet Orders (From admission, onward)        Start     Ordered    01/28/22 0000   Diet - low sodium heart healthy        01/28/22 1348                  Discharge Diagnoses:  Principal Problem:   VTE (venous thromboembolism) Active Problems:   Essential hypertension   Blood clots in brain   Primary malignant neoplasm of left lung metastatic to other site [facility])   Malignant neoplasm of bone with metastases ([place])   Counseling regarding advance care planning and goals of care     Brief Summary: Patient is a 66 year old female with history of hypertension, hyperlipidemia, fibromyalgia, recent diagnosis of adenocarcinoma of lung with metastatic disease who was admitted for the management of sinus thrombosis as per outpatient MRI.  Patient was started on anticoagulation, neurology team also consulted.  She was subsequently transferred to [facility] from [facility] for palliative radiation to her hips.  Patient was also found to have metastatic bone lesions, lytic right femur lesion for which Ortho was consulted who recommended weightbearing as tolerated.  Hospital course remarkable for persistent pain, palliative care consulted and following for pain management, titrating pain medications.  Overall pain symptoms appear to be better.  TOC has arranged DME and home health services.     Assessment & Plan:   Straight sinus vein thrombosis: Found as per MRI brain done as an outpatient.  This is secondary to malignancy induced hypercoagulability.  Neurology was also consulted on admission.  Started on Lovenox, transitioned to [place]    Osteolytic bone lesions/persistent pain: Metastatic bone lesions.  Transferred to [facility] for radiation oncology treatment.  She completed radiation treatments on 01/23/2022. Found to have multiple metastatic lesions on the pelvis, proximal femur.  Orthopedics recommended  weightbearing as tolerated with walker at all times, possible outpatient referral to Ortho oncology at tertiary care center. Palliative medicine assisted in adjusting pain management. She is currently on oxycontin, oxycodone, celebrex, cymbalta and lyrica She has been ordered wheelchair, hospital bed and home health services including [facility] PT/OT/RN/aide/SW. Her daughter will be staying with her at home.  As per communication with TOC team, all  home health services and DME have been arranged.  As discussed with Dr. Rowe Pavy, PMT, recommends discharging on current multimodality pain regimen as noted below and she will coordinate with her team to have patient seen by the palliative care team at the cancer center when she visits her oncologist on 02/10/2022.   Addendum: After her meds had been sent to her primary outpatient pharmacy, received communication but the pharmacy would not be able to fill her OxyContin.  Communicated at length with Dr. Rowe Pavy, who then changed her OxyContin to [facility]  She called a prescription for Xtampza and oxycodone IR to the [facility] outpatient pharmacy.   Metastatic lung cancer, adenocarcinoma: Recently underwent bronchoscopy with EBUS on 8/1 by pulmonology, biopsies performed.  Cytology consistent with adenocarcinoma.  She completed radiation treatment on 01/23/2022.  Continue pain management, supportive care.  She has a follow up appointment with oncology to discuss genetic testing and further treatment options Palliative care also consulted for goals of care.  Remains full code. Referral to orthopedic oncology at tertiary center can be considered as outpatient, based on treatment decisions after discussing with oncology   Hypertension: On hydrochlorothiazide   Hyperlipidemia: On Zetia. She does not have diabetes, no history of stroke, coronary artery disease or peripheral vascular disease. No indication to start on a statin to decrease LDL to less than 70    Fibromyalgia: Continue pain management, supportive care   Urinary retention: Foley catheter was placed, added Flomax.  Voiding trial attempted on 8/17 but she failed, so Foley reinserted.  She needs to follow-up with urology as an outpatient and discharge with Foley catheter   Obesity: BMI  of 34.6   Constipation: She is on opiates and is not very mobile, continue aggressive bowel regimen -Constipation now resolved,   Debility/deconditioning/position: PT/OT recommended home health on discharge.  Will likely need DME such as wheelchair, hospital bed, these have been ordered.   Nutrition Problem: Increased nutrient needs Etiology: cancer and cancer related treatments     Consultations: Palliative care medicine, radiation oncology, medical oncology.   Procedures: None.     Discharge Instructions   Discharge Instructions       Ambulatory referral to Neurology   Complete by: As directed      Follow up with Dr. Leonie Man at [facility] in 4-6 weeks. Too complicated for RN to follow. Thanks.    Call MD for:  difficulty breathing, headache or visual disturbances   Complete by: As directed      Call MD for:  extreme fatigue   Complete by: As directed      Call MD for:  persistant dizziness or light-headedness   Complete by: As directed      Call MD for:  persistant nausea and vomiting   Complete by: As directed      Call MD for:  severe uncontrolled pain   Complete by: As directed      Call MD for:  temperature >100.4   Complete by: As directed      Diet - low sodium heart healthy   Complete by: As directed      Increase activity slowly   Complete by: As directed            Medications: Outpatient Medications Prior to Visit  Medication Sig   ALPRAZolam (XANAX) 0.5 MG tablet Take 1 tablet (0.5 mg total) by mouth at bedtime.   apixaban (ELIQUIS) 5 MG TABS tablet Take 1 tablet (5 mg total) by mouth 2 (  two) times daily.   bisacodyl (DULCOLAX) 10 MG suppository Place 1 suppository (10  mg total) rectally daily as needed for severe constipation.   celecoxib (CELEBREX) 200 MG capsule Take 1 capsule (200 mg total) by mouth daily.   dexamethasone (DECADRON) 2 MG tablet Take 1 tablet (2 mg total) by mouth daily.   diphenhydrAMINE (BENADRYL) 25 MG tablet Take 25 mg by mouth daily as needed for allergies.   DULoxetine 40 MG CPEP Take 40 mg by mouth daily.   ezetimibe (ZETIA) 10 MG tablet TAKE 1 TABLET (10 MG TOTAL) BY MOUTH DAILY.   hydrochlorothiazide (HYDRODIURIL) 12.5 MG tablet TAKE 1 TABLET (12.5 MG TOTAL) BY MOUTH DAILY.   lidocaine (LIDODERM) 5 % Place 1 patch onto the skin daily. Remove & Discard patch within 12 hours or as directed by MD   methocarbamol 1000 MG TABS Take 1,000 mg by mouth 3 (three) times daily.   Multiple Minerals-Vitamins (CAL MAG ZINC +D3 PO) Take 1 tablet by mouth 3 (three) times daily.   Multiple Vitamin (MULTIVITAMIN) capsule Take 1 capsule by mouth daily.   Nutritional Supplements (FEEDING SUPPLEMENT, KATE FARMS STANDARD 1.4,) LIQD liquid Take 325 mLs by mouth 2 (two) times daily between meals.   oxyCODONE ER (XTAMPZA ER) 27 MG C12A Take 2 capsules by mouth in the morning and at bedtime.   Oxycodone HCl 20 MG TABS Take 2 tablets (40 mg total) by mouth every 4 (four) hours as needed.   pantoprazole (PROTONIX) 20 MG tablet Take 1 tablet (20 mg total) by mouth 2 (two) times daily.   polyethylene glycol (MIRALAX / GLYCOLAX) 17 g packet Take 17 g by mouth 2 (two) times daily.   pregabalin (LYRICA) 50 MG capsule Take 1 capsule (50 mg total) by mouth 2 (two) times daily.   senna-docusate (SENOKOT-S) 8.6-50 MG tablet Take 2 tablets by mouth 2 (two) times daily.   tamsulosin (FLOMAX) 0.4 MG CAPS capsule Take 1 capsule (0.4 mg total) by mouth daily after breakfast.   Vitamin D, Ergocalciferol, (DRISDOL) 1.25 MG (50000 UNIT) CAPS capsule Take 1 capsule (50,000 Units total) by mouth every 7 (seven) days.   No facility-administered medications prior to visit.     Review of Systems Review of Systems:  A fourteen system review of systems was performed and found to be positive as per HPI.  Last CBC Lab Results  Component Value Date   WBC 4.1 01/28/2022   HGB 12.0 01/28/2022   HCT 36.4 01/28/2022   MCV 102.8 (H) 01/28/2022   MCH 33.9 01/28/2022   RDW 14.5 01/28/2022   PLT 215 25/49/8264   Last metabolic panel Lab Results  Component Value Date   GLUCOSE 127 (H) 01/28/2022   NA 138 01/28/2022   K 3.5 01/28/2022   CL 104 01/28/2022   CO2 27 01/28/2022   BUN 17 01/28/2022   CREATININE 0.39 (L) 01/28/2022   GFRNONAA >60 01/28/2022   CALCIUM 7.2 (L) 01/28/2022   PROT 7.3 07/07/2021   ALBUMIN 4.7 07/07/2021   LABGLOB 2.6 07/07/2021   AGRATIO 1.8 07/07/2021   BILITOT 0.4 07/07/2021   ALKPHOS 121 07/07/2021   AST 34 07/07/2021   ALT 21 07/07/2021   ANIONGAP 7 01/28/2022   Last lipids Lab Results  Component Value Date   CHOL 165 01/10/2022   HDL 36 (L) 01/10/2022   LDLCALC 87 01/10/2022   TRIG 210 (H) 01/10/2022   CHOLHDL 4.6 01/10/2022   Last hemoglobin A1c Lab Results  Component Value Date  HGBA1C 5.6 01/10/2022   Last thyroid functions Lab Results  Component Value Date   TSH 2.550 12/10/2020   Last vitamin D Lab Results  Component Value Date   VD25OH 43.16 01/10/2022       Objective    BP 119/76   Pulse 88   Temp 97.7 F (36.5 C)   Ht $R'5\' 3"'Uy$  (1.6 m)   Wt 195 lb (88.5 kg)   SpO2 97%   BMI 34.54 kg/m  BP Readings from Last 3 Encounters:  02/04/22 119/76  01/28/22 118/62  01/06/22 111/72   Wt Readings from Last 3 Encounters:  02/04/22 195 lb (88.5 kg)  01/19/22 195 lb 12.3 oz (88.8 kg)  01/06/22 175 lb (79.4 kg)    Physical Exam  General:  Cooperative, in no acute distress, appropriate for stated age.  Neuro:  Alert and oriented,  extra-ocular muscles intact  HEENT:  Normocephalic, atraumatic, neck supple  Skin:  no gross rash, warm, pink. Cardiac:  RRR, S1 S2 Respiratory: CTA B/L  Vascular:   Ext warm, no cyanosis apprec.; cap RF less 2 sec. Psych:  No HI/SI, judgement and insight good, Euthymic mood. Full Affect.   No results found for any visits on 02/04/22.  Assessment & Plan      Problem List Items Addressed This Visit       Cardiovascular and Mediastinum   Essential hypertension (Chronic)   VTE (venous thromboembolism)   Relevant Orders   CBC w/Diff   Comp Met (CMET)   Magnesium     Respiratory   Primary malignant neoplasm of left lung metastatic to other site [facility])     Musculoskeletal and Integument   Malignant neoplasm of bone with metastases ([place])     Other   Mixed hyperlipidemia (Chronic)   Other Visit Diagnoses     Hospital discharge follow-up    -  Primary   Relevant Orders   CBC w/Diff   Comp Met (CMET)   Magnesium   Panic attack as reaction to stress          Hospital discharge follow-up, VTE: -Reviewed hospital notes, labs and imaging studies. -Will collect CBC w/d, CMP, and magnesium. -Recommend to follow-up with palliative care, oncology and neurology as scheduled. Discussed to continue with Eliquis 5 mg twice daily and will appreciate neurology input for duration of medication therapy for straight sinus vein thrombosis.  -Follow-up with [facility] as scheduled. -Continue with Home Health PT/nursing services.  Essential hypertension: -BP controlled. Continue HCTZ 12.5 mg daily. Will continue to monitor.  Mixed hyperlipidemia: -Continue Zetia 10 mg daily. Will continue to monitor.  Panic attack as a reaction to stress: -Discussed with patient and family rx for alprazolam was filled for a 15 day supply 01/29/23 per PDMP so too soon for refill for controlled substance, recommend to double check medications at home. Willing to provide a medication refill when due for refill.   Return in about 4 months (around 06/06/2022) for HTN, HLD.        Lorrene Reid, PA-C  [facility] at [facility] [phone]  (phone) [phone] (fax)  [place]

## 2022-02-04 NOTE — Patient Instructions (Signed)

## 2022-02-05 ENCOUNTER — Other Ambulatory Visit: Payer: Self-pay | Admitting: Nurse Practitioner

## 2022-02-05 DIAGNOSIS — C3492 Malignant neoplasm of unspecified part of left bronchus or lung: Secondary | ICD-10-CM

## 2022-02-05 DIAGNOSIS — C419 Malignant neoplasm of bone and articular cartilage, unspecified: Secondary | ICD-10-CM

## 2022-02-05 LAB — CBC WITH DIFFERENTIAL/PLATELET
Basophils Absolute: 0 10*3/uL (ref 0.0–0.2)
Basos: 0 %
EOS (ABSOLUTE): 0 10*3/uL (ref 0.0–0.4)
Eos: 1 %
Hematocrit: 32.9 % — ABNORMAL LOW (ref 34.0–46.6)
Hemoglobin: 11.6 g/dL (ref 11.1–15.9)
Immature Grans (Abs): 0.1 10*3/uL (ref 0.0–0.1)
Immature Granulocytes: 2 %
Lymphocytes Absolute: 0.3 10*3/uL — ABNORMAL LOW (ref 0.7–3.1)
Lymphs: 8 %
MCH: 34.2 pg — ABNORMAL HIGH (ref 26.6–33.0)
MCHC: 35.3 g/dL (ref 31.5–35.7)
MCV: 97 fL (ref 79–97)
Monocytes Absolute: 0.3 10*3/uL (ref 0.1–0.9)
Monocytes: 7 %
Neutrophils Absolute: 3.3 10*3/uL (ref 1.4–7.0)
Neutrophils: 82 %
Platelets: 135 10*3/uL — ABNORMAL LOW (ref 150–450)
RBC: 3.39 x10E6/uL — ABNORMAL LOW (ref 3.77–5.28)
RDW: 14.1 % (ref 11.7–15.4)
WBC: 4 10*3/uL (ref 3.4–10.8)

## 2022-02-05 LAB — COMPREHENSIVE METABOLIC PANEL
ALT: 41 IU/L — ABNORMAL HIGH (ref 0–32)
AST: 28 IU/L (ref 0–40)
Albumin/Globulin Ratio: 1.5 (ref 1.2–2.2)
Albumin: 3.4 g/dL — ABNORMAL LOW (ref 3.9–4.9)
Alkaline Phosphatase: 208 IU/L — ABNORMAL HIGH (ref 44–121)
BUN/Creatinine Ratio: 29 — ABNORMAL HIGH (ref 12–28)
BUN: 10 mg/dL (ref 8–27)
Bilirubin Total: 0.5 mg/dL (ref 0.0–1.2)
CO2: 24 mmol/L (ref 20–29)
Calcium: 8.2 mg/dL — ABNORMAL LOW (ref 8.7–10.3)
Chloride: 99 mmol/L (ref 96–106)
Creatinine, Ser: 0.35 mg/dL — ABNORMAL LOW (ref 0.57–1.00)
Globulin, Total: 2.3 g/dL (ref 1.5–4.5)
Glucose: 162 mg/dL — ABNORMAL HIGH (ref 70–99)
Potassium: 4.5 mmol/L (ref 3.5–5.2)
Sodium: 137 mmol/L (ref 134–144)
Total Protein: 5.7 g/dL — ABNORMAL LOW (ref 6.0–8.5)
eGFR: 113 mL/min/{1.73_m2} (ref >59)

## 2022-02-05 LAB — MAGNESIUM: Magnesium: 2.1 mg/dL (ref 1.6–2.3)

## 2022-02-06 ENCOUNTER — Telehealth: Payer: Self-pay | Admitting: *Deleted

## 2022-02-06 NOTE — Telephone Encounter (Signed)
Amy , PT from [facility] called for a verbal order for [facility] PT 1 time per week for 4 weeks. Also note a small stage 2 pressure ulcer in gluteal area, RN to assess.

## 2022-02-10 ENCOUNTER — Other Ambulatory Visit (HOSPITAL_COMMUNITY): Payer: Self-pay

## 2022-02-10 ENCOUNTER — Inpatient Hospital Stay (HOSPITAL_BASED_OUTPATIENT_CLINIC_OR_DEPARTMENT_OTHER): Payer: Medicare Other | Admitting: Nurse Practitioner

## 2022-02-10 ENCOUNTER — Encounter: Payer: Self-pay | Admitting: Nurse Practitioner

## 2022-02-10 ENCOUNTER — Other Ambulatory Visit: Payer: Self-pay

## 2022-02-10 ENCOUNTER — Telehealth: Payer: Self-pay | Admitting: Physician Assistant

## 2022-02-10 ENCOUNTER — Inpatient Hospital Stay: Payer: Medicare Other | Attending: Hematology | Admitting: Hematology

## 2022-02-10 VITALS — BP 124/52 | HR 89 | Temp 98.7°F | Resp 18

## 2022-02-10 DIAGNOSIS — E785 Hyperlipidemia, unspecified: Secondary | ICD-10-CM | POA: Diagnosis not present

## 2022-02-10 DIAGNOSIS — C3492 Malignant neoplasm of unspecified part of left bronchus or lung: Secondary | ICD-10-CM

## 2022-02-10 DIAGNOSIS — Z79899 Other long term (current) drug therapy: Secondary | ICD-10-CM | POA: Insufficient documentation

## 2022-02-10 DIAGNOSIS — Z515 Encounter for palliative care: Secondary | ICD-10-CM | POA: Diagnosis not present

## 2022-02-10 DIAGNOSIS — Z86718 Personal history of other venous thrombosis and embolism: Secondary | ICD-10-CM | POA: Diagnosis not present

## 2022-02-10 DIAGNOSIS — Z7901 Long term (current) use of anticoagulants: Secondary | ICD-10-CM | POA: Diagnosis not present

## 2022-02-10 DIAGNOSIS — C7951 Secondary malignant neoplasm of bone: Secondary | ICD-10-CM | POA: Diagnosis not present

## 2022-02-10 DIAGNOSIS — Z7189 Other specified counseling: Secondary | ICD-10-CM | POA: Diagnosis not present

## 2022-02-10 DIAGNOSIS — K5903 Drug induced constipation: Secondary | ICD-10-CM

## 2022-02-10 DIAGNOSIS — Z5111 Encounter for antineoplastic chemotherapy: Secondary | ICD-10-CM | POA: Diagnosis present

## 2022-02-10 DIAGNOSIS — M797 Fibromyalgia: Secondary | ICD-10-CM | POA: Insufficient documentation

## 2022-02-10 DIAGNOSIS — G893 Neoplasm related pain (acute) (chronic): Secondary | ICD-10-CM | POA: Diagnosis not present

## 2022-02-10 DIAGNOSIS — Z5112 Encounter for antineoplastic immunotherapy: Secondary | ICD-10-CM | POA: Diagnosis present

## 2022-02-10 DIAGNOSIS — C3432 Malignant neoplasm of lower lobe, left bronchus or lung: Secondary | ICD-10-CM | POA: Diagnosis present

## 2022-02-10 DIAGNOSIS — I10 Essential (primary) hypertension: Secondary | ICD-10-CM | POA: Insufficient documentation

## 2022-02-10 DIAGNOSIS — C771 Secondary and unspecified malignant neoplasm of intrathoracic lymph nodes: Secondary | ICD-10-CM | POA: Diagnosis not present

## 2022-02-10 MED ORDER — XTAMPZA ER 27 MG PO C12A
54.0000 mg | EXTENDED_RELEASE_CAPSULE | Freq: Two times a day (BID) | ORAL | 0 refills | Status: DC
  Filled 2022-02-11: qty 90, 23d supply, fill #0

## 2022-02-10 MED ORDER — OXYCODONE HCL 20 MG PO TABS: 20.0000 mg | ORAL_TABLET | ORAL | 0 refills | Status: DC | PRN

## 2022-02-10 NOTE — Progress Notes (Signed)
HEMATOLOGY/ONCOLOGY CONSULTATION NOTE  Date of Service: 02/10/2022  Patient Care Team: Mayer Masker, PA-C as PCP - General Dominica Severin, MD as Consulting Physician (Orthopedic Surgery) Cherlyn Roberts, MD as Consulting Physician (Dermatology) Dalton-Bethea, Ashok Croon, MD (Physical Medicine and Rehabilitation) Trey Sailors, MD as Attending Physician (Neurosurgery) John Giovanni, MD as Resident (Internal Medicine) Armbruster, Willaim Rayas, MD as Consulting Physician (Gastroenterology)  CHIEF COMPLAINTS/PURPOSE OF CONSULTATION:  Evaluation and management of stage IV metastatic non-small cell lung cancer, adenocarcinoma.  HISTORY OF PRESENTING ILLNESS:  Misty Decker is a wonderful 66 y.o. female who presented to the ED with significant pain in her left thigh and right hip. She presents in clinic today to follow up for evaluation and management of recently diagnosed stage IV metastatic non-small cell lung cancer, adenocarcinoma. She reports She is doing well with no new symptoms or concerns.  We discussed various treatment options in addition to the option for palliative care. The patient has expressed preference for outpatient treatment at this time.  She notes she is eating well.  No other new or acute focal symptoms.  Las done 02/04/2022 were reviewed in detail.  MEDICAL HISTORY:  Past Medical History:  Diagnosis Date   Complication of anesthesia    "Hard to sedate", per pt and slow to wake up   COVID 10/2020   Degenerative joint disease    s/p cervical and lumbar fusions   Depression    pt denies   Encounter for preventive health examination    Next mammogram in 07/2009. Next colonoscopy in 09/2017. DEXA 4/09>Normal   Encounter for screening colonoscopy 10/03/2007   Normal   Fatty liver    Fibromyalgia    GERD (gastroesophageal reflux disease)    Hemorrhoid    hx of   History of hiatal hernia    Hyperlipidemia    Hypertension    Sciatica     SURGICAL  HISTORY: Past Surgical History:  Procedure Laterality Date   ABDOMINAL HYSTERECTOMY N/A    [facility]rees[facility] 11/21/2019   APPENDECTOMY N/A    [facility] 11/21/2019   BRONCHIAL NEEDLE ASPIRATION BIOPSY  01/06/2022   Procedure: BRONCHIAL NEEDLE ASPIRATION BIOPSIES;  Surgeon: Josephine Igo, DO;  Location: [facility];  Service: Pulmonary;;   CERVICAL FUSION  2010   X 2   LUMBAR FUSION  1997   X 2/has plate and screws and cage   OVARIAN CYST SURGERY  1980   pelvic prolapse  1987   RECTAL PROLAPSE REPAIR     SPINE SURGERY N/A    [facility]ia 11/21/2019   TONSILLECTOMY     and adenoids   TOTAL ABDOMINAL HYSTERECTOMY  1987   VIDEO BRONCHOSCOPY WITH ENDOBRONCHIAL ULTRASOUND Left 01/06/2022   Procedure: VIDEO BRONCHOSCOPY WITH ENDOBRONCHIAL ULTRASOUND;  Surgeon: Josephine Igo, DO;  Location: [facility];  Service: Pulmonary;  Laterality: Left;    SOCIAL HISTORY: Social History   Socioeconomic History   Marital status: Married    Spouse name: Not on file   Number of children: Not on file   Years of education: Not on file   Highest education level: Not on file  Occupational History   Not on file  Tobacco Use   Smoking status: Former    Packs/day: 2.00    Years: 30.00    Total pack years: 60.00    Types: Cigarettes    Quit date: 04/03/2003    Years since quitting: 18.8   Smokeless tobacco: Never  Vaping Use   Vaping Use: Never used  Substance and Sexual Activity   Alcohol use: Not Currently    Alcohol/week: 0.0 standard drinks of alcohol   Drug use: No   Sexual activity: Not Currently  Other Topics Concern   Not on file  Social History Narrative   Not on file   Social Determinants of Health   Financial Resource Strain: Medium Risk (03/02/2021)   Overall Financial Resource Strain (CARDIA)    Difficulty of Paying Living Expenses: Somewhat hard  Food Insecurity: No Food Insecurity (03/02/2021)   Hunger Vital Sign    Worried About Running Out of Food in the Last Year: Never true     Ran Out of Food in the Last Year: Never true  Transportation Needs: No Transportation Needs (03/02/2021)   PRAPARE - Hydrologist (Medical): No    Lack of Transportation (Non-Medical): No  Physical Activity: Inactive (03/02/2021)   Exercise Vital Sign    Days of Exercise per Week: 0 days    Minutes of Exercise per Session: 0 min  Stress: Stress Concern Present (03/02/2021)   [place]    Feeling of Stress : To some extent  Social Connections: Moderately Integrated (03/02/2021)   Social Connection and Isolation Panel [NHANES]    Frequency of Communication with Friends and Family: More than three times a week    Frequency of Social Gatherings with Friends and Family: More than three times a week    Attends Religious Services: More than 4 times per year    Active Member of [employer] or Organizations: No    Attends Archivist Meetings: Never    Marital Status: Married  Human resources officer Violence: Not At Risk (03/02/2021)   Humiliation, Afraid, Rape, and Kick questionnaire    Fear of Current or Ex-Partner: No    Emotionally Abused: No    Physically Abused: No    Sexually Abused: No    FAMILY HISTORY: Family History  Problem Relation Age of Onset   Cancer Mother    Diabetes Mother    Breast cancer Mother    Heart attack Mother    Hyperlipidemia Mother    Hyperlipidemia Father    Hypertension Father    Aneurysm Father    Dementia Maternal Grandfather    Cancer Paternal Grandmother        intestinal    ALLERGIES:  is allergic to codeine, morphine, beef-derived products, doxycycline, ibuprofen, iodinated contrast media, latex, nsaids, penicillins, pork-derived products, tramadol, and gabapentin.  MEDICATIONS:  Current Outpatient Medications  Medication Sig Dispense Refill   ALPRAZolam (XANAX) 0.5 MG tablet Take 1 tablet (0.5 mg total) by mouth at bedtime. 15 tablet 0   apixaban  (ELIQUIS) 5 MG TABS tablet Take 1 tablet (5 mg total) by mouth 2 (two) times daily. 60 tablet 1   bisacodyl (DULCOLAX) 10 MG suppository Place 1 suppository (10 mg total) rectally daily as needed for severe constipation. 12 suppository 0   celecoxib (CELEBREX) 200 MG capsule Take 1 capsule (200 mg total) by mouth daily. 30 capsule 0   dexamethasone (DECADRON) 2 MG tablet Take 1 tablet (2 mg total) by mouth daily. 30 tablet 0   diphenhydrAMINE (BENADRYL) 25 MG tablet Take 25 mg by mouth daily as needed for allergies.     DULoxetine 40 MG CPEP Take 40 mg by mouth daily. 30 capsule 0   ezetimibe (ZETIA) 10 MG tablet TAKE 1 TABLET (10 MG TOTAL) BY MOUTH DAILY. 90 tablet 0  hydrochlorothiazide (HYDRODIURIL) 12.5 MG tablet TAKE 1 TABLET (12.5 MG TOTAL) BY MOUTH DAILY. 90 tablet 1   lidocaine (LIDODERM) 5 % Place 1 patch onto the skin daily. Remove & Discard patch within 12 hours or as directed by MD 30 patch 0   methocarbamol 1000 MG TABS Take 1,000 mg by mouth 3 (three) times daily. 90 tablet 0   Multiple Minerals-Vitamins (CAL MAG ZINC +D3 PO) Take 1 tablet by mouth 3 (three) times daily.     Multiple Vitamin (MULTIVITAMIN) capsule Take 1 capsule by mouth daily.     Nutritional Supplements (FEEDING SUPPLEMENT, KATE FARMS STANDARD 1.4,) LIQD liquid Take 325 mLs by mouth 2 (two) times daily between meals.     oxyCODONE ER (XTAMPZA ER) 27 MG C12A Take 2 capsules by mouth in the morning and at bedtime. 60 capsule 0   Oxycodone HCl 20 MG TABS Take 1-2 tablets (20-40 mg total) by mouth every 4 (four) hours as needed. 90 tablet 0   pantoprazole (PROTONIX) 20 MG tablet Take 1 tablet (20 mg total) by mouth 2 (two) times daily. 60 tablet 1   polyethylene glycol (MIRALAX / GLYCOLAX) 17 g packet Take 17 g by mouth 2 (two) times daily. 30 each 1   pregabalin (LYRICA) 50 MG capsule Take 1 capsule (50 mg total) by mouth 2 (two) times daily. 60 capsule 0   senna-docusate (SENOKOT-S) 8.6-50 MG tablet Take 2 tablets by  mouth 2 (two) times daily. 120 tablet 1   tamsulosin (FLOMAX) 0.4 MG CAPS capsule Take 1 capsule (0.4 mg total) by mouth daily after breakfast. 30 capsule 0   Vitamin D, Ergocalciferol, (DRISDOL) 1.25 MG (50000 UNIT) CAPS capsule Take 1 capsule (50,000 Units total) by mouth every 7 (seven) days. 5 capsule 0   No current facility-administered medications for this visit.    REVIEW OF SYSTEMS:    10 Point review of Systems was done is negative except as noted above.  PHYSICAL EXAMINATION: ECOG PERFORMANCE STATUS: 1 - Symptomatic but completely ambulatory  VS reviewed NAD GENERAL:alert, in no acute distress and comfortable SKIN: no acute rashes, no significant lesions EYES: conjunctiva are pink and non-injected, sclera anicteric NECK: supple, no JVD LYMPH:  no palpable lymphadenopathy in the cervical, axillary or inguinal regions LUNGS: clear to auscultation b/l with normal respiratory effort HEART: regular rate & rhythm ABDOMEN:  normoactive bowel sounds , non tender, not distended. Extremity: no pedal edema PSYCH: alert & oriented x 3 with fluent speech NEURO: no focal motor/sensory deficits  LABORATORY DATA:  I have reviewed the data as listed  .    Latest Ref Rng & Units 02/04/2022    2:20 PM 01/28/2022    6:49 AM 01/24/2022    5:58 AM  CBC  WBC 3.4 - 10.8 x10E3/uL 4.0  4.1  6.6   Hemoglobin 11.1 - 15.9 g/dL 11.6  12.0  13.2   Hematocrit 34.0 - 46.6 % 32.9  36.4  40.9   Platelets 150 - 450 x10E3/uL 135  215  262    .    Latest Ref Rng & Units 02/04/2022    2:20 PM 01/28/2022    6:49 AM 01/24/2022    5:58 AM  CMP  Glucose 70 - 99 mg/dL 162  127  110   BUN 8 - 27 mg/dL $Remove'10  17  22   'ietBVcF$ Creatinine 0.57 - 1.00 mg/dL 0.35  0.39  0.48   Sodium 134 - 144 mmol/L 137  138  136  Potassium 3.5 - 5.2 mmol/L 4.5  3.5  3.9   Chloride 96 - 106 mmol/L 99  104  98   CO2 20 - 29 mmol/L 24  27  32   Calcium 8.7 - 10.3 mg/dL 8.2  7.2  9.0   Total Protein 6.0 - 8.5 g/dL 5.7     Total  Bilirubin 0.0 - 1.2 mg/dL 0.5     Alkaline Phos 44 - 121 IU/L 208     AST 0 - 40 IU/L 28     ALT 0 - 32 IU/L 41      GYN CYTOLOGY - NON PAP  CASE: [id number]  PATIENT: Misty Decker  Non-Gynecological Cytology Report      Clinical History: Lung mass, adenopathy     FINAL MICROSCOPIC DIAGNOSIS:   A. LYMPH NODE, STATION 7, FINE NEEDLE ASPIRATION:  - Malignant cells consistent with adenocarcinoma, see comment       COMMENT:   Immunohistochemical stains show that the tumor cells are negative for  TTF-1 and cytokeratin AE1/AE3 (membranous staining); while they are  negative for synaptophysin, CD56, p63 and CK5/6.  This immunoprofile is  consistent with above interpretation.  Dr. Arby Barrette reviewed the case  and concurs with the above diagnosis.  Dr. Valeta Harms was notified on  01/09/2022.        RADIOGRAPHIC STUDIES: I have personally reviewed the radiological images as listed and agreed with the findings in the report. No results found.   ASSESSMENT & PLAN:   67 y.o. very pleasant female with  1. Stage IV metastatic non-small cell lung cancer, adenocarcinoma with extensive bone mets -12/26/2021 CT chest/abdomen/pelvis -left lower lobe mass measuring 5.3 x 4.1 x 4.5 cm, diffuse lytic and sclerotic lesions in the bones with possible pathologic fracture at L2 -01/05/2022 PET scan-left lower lobe lung mass intensely FDG avid, left upper lobe pulmonary nodule suspicious for pulmonary metastasis, FDG avid ipsilateral hilar, ipsilateral mediastinal, subcarinal, and contralateral right mediastinal nodal metastasis, widespread FDG avid lytic bone metastases with lytic lesion within the right proximal femur which may be of orthopedic significance. -01/08/2022 MRI brain with and without contrast-thrombus within the straight sinus, no evidence of intracranial metastatic disease.   2) posterior left iliac lesion/right distal inferior femoral neck, intertrochanteric, and lesser  trochanteric lytic lesion at risk for pathologic fracture -01/09/2022 CT femur right-diffuse lytic metastases seen throughout the pelvis and proximal right femur, dominant right distal inferior femoral neck, intertrochanteric, and lesser trochanteric lytic lesion-increase his susceptibility for risk of pathologic fracture -01/09/2022 CT femur left-numerous lytic metastases within the pelvis, dominant partially visualized posterior left iliac lesion measuring 2.5 x 5 cm, smaller superolateral left acetabular 1.9 cm lytic lesion which abuts the lateral cortex without definite cortical breakthrough   3) thrombus straight sinus -Seen by neurology who recommended full dose Lovenox x5 days followed by dabigatran 150 mg p.o. twice daily. -Patient has been started on apixaban now.  4) hypertension   5) hyperlipidemia   6) fibromyalgia   7) h/o cervical and lumbar spinal fusion with chronic back pain issues with L2 compression fracture  Plan -I discussed available labs and imaging studies in detail with the patient and her daughter. -Patient's chart reviewed in detail -We discussed the natural history, staging, initial evaluation, common symptoms, treatment options follow-up for her metastatic lung adenocarcinoma were discussed in details. 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 PDL1 level 0 -She will be started  on Carbo/alimta/pembrolizumab and Zometa in 7-10 days. -continue current cancer pain mx and will refer to outpatient palliative care for continued symptom management. -goals of care discussed.  Follow up: Chemo-education for carboplatin/Alimta/Pembrolizumab Port a cath placement by IR Plz schedule to start Carbo/alimta/pembrolizumab and Zometa in 7-10 days Palliative care referral to [facility]  .The total time spent in the appointment was 41 minutes* .  All of the patient's questions  were answered with apparent satisfaction. The patient knows to call the clinic with any problems, questions or concerns.   Sullivan Lone MD MS AAHIVMS [facility] Hematology/Oncology Physician [facility]  .*Total Encounter Time as defined by the Centers for Medicare and Medicaid Services includes, in addition to the face-to-face time of a patient visit (documented in the note above) non-face-to-face time: obtaining and reviewing outside history, ordering and reviewing medications, tests or procedures, care coordination (communications with other health care professionals or caregivers) and documentation in the medical record.   I, Melene Muller, am acting as scribe for Fabienne Bruns, MD. .I have reviewed the above documentation for accuracy and completeness, and I agree with the above. Brunetta Genera MD   .Brunetta Genera MD

## 2022-02-10 NOTE — Progress Notes (Signed)
[place]  [phone] [phone]) [phone]   Name: Misty Decker Date: 02/10/2022 MRN: [id number]  DOB: Mar 20, 1956  Patient Care Team: Lorrene Reid, PA-C as PCP - General Roseanne Kaufman, MD as Consulting Physician (Orthopedic Surgery) Druscilla Brownie, MD as Consulting Physician (Dermatology) Dalton-Bethea, Fabio Asa, MD (Physical Medicine and Rehabilitation) Glenna Fellows, MD as Attending Physician (Neurosurgery) Shela Leff, MD as Resident (Internal Medicine) Armbruster, Carlota Raspberry, MD as Consulting Physician (Gastroenterology)    REASON FOR CONSULTATION: Misty Decker is a 66 y.o. female with medical history including recent diagnosis of lung cancer with concern for spinal mets, hypertension, fibromyalgia, depression, and hyperlipidemia.  Palliative ask to see for symptom management and goals of care.    SOCIAL HISTORY:     reports that she quit smoking about 18 years ago. Her smoking use included cigarettes. She has a 60.00 pack-year smoking history. She has never used smokeless tobacco. She reports that she does not currently use alcohol. She reports that she does not use drugs.  ADVANCE DIRECTIVES:    CODE STATUS:   PAST MEDICAL HISTORY: Past Medical History:  Diagnosis Date   Complication of anesthesia    "Hard to sedate", per pt and slow to wake up   COVID 10/2020   Degenerative joint disease    s/p cervical and lumbar fusions   Depression    pt denies   Encounter for preventive health examination    Next mammogram in 07/2009. Next colonoscopy in 09/2017. DEXA 4/09>Normal   Encounter for screening colonoscopy 10/03/2007   Normal   Fatty liver    Fibromyalgia    GERD (gastroesophageal reflux disease)    Hemorrhoid    hx of   History of hiatal hernia    Hyperlipidemia    Hypertension    Sciatica     PAST SURGICAL HISTORY:  Past Surgical History:  Procedure Laterality Date   ABDOMINAL  HYSTERECTOMY N/A    [facility] 11/21/2019   APPENDECTOMY N/A    [facility] 11/21/2019   BRONCHIAL NEEDLE ASPIRATION BIOPSY  01/06/2022   Procedure: BRONCHIAL NEEDLE ASPIRATION BIOPSIES;  Surgeon: Garner Nash, DO;  Location: [facility];  Service: Pulmonary;;   CERVICAL FUSION  2010   X 2   LUMBAR FUSION  1997   X 2/has plate and screws and cage   OVARIAN CYST SURGERY  1980   pelvic prolapse  1987   RECTAL PROLAPSE REPAIR     SPINE SURGERY N/A    [facility] 11/21/2019   TONSILLECTOMY     and adenoids   TOTAL ABDOMINAL HYSTERECTOMY  1987   VIDEO BRONCHOSCOPY WITH ENDOBRONCHIAL ULTRASOUND Left 01/06/2022   Procedure: VIDEO BRONCHOSCOPY WITH ENDOBRONCHIAL ULTRASOUND;  Surgeon: Garner Nash, DO;  Location: [facility];  Service: Pulmonary;  Laterality: Left;    HEMATOLOGY/ONCOLOGY HISTORY:  Oncology History   No history exists.    ALLERGIES:  is allergic to codeine, morphine, beef-derived products, doxycycline, ibuprofen, iodinated contrast media, latex, nsaids, penicillins, pork-derived products, tramadol, and gabapentin.  MEDICATIONS:  Current Outpatient Medications  Medication Sig Dispense Refill   ALPRAZolam (XANAX) 0.5 MG tablet Take 1 tablet (0.5 mg total) by mouth at bedtime. 15 tablet 0   apixaban (ELIQUIS) 5 MG TABS tablet Take 1 tablet (5 mg total) by mouth 2 (two) times daily. 60 tablet 1   bisacodyl (DULCOLAX) 10 MG suppository Place 1 suppository (10 mg total) rectally daily as needed for severe constipation. 12 suppository 0   celecoxib (  CELEBREX) 200 MG capsule Take 1 capsule (200 mg total) by mouth daily. 30 capsule 0   dexamethasone (DECADRON) 2 MG tablet Take 1 tablet (2 mg total) by mouth daily. 30 tablet 0   diphenhydrAMINE (BENADRYL) 25 MG tablet Take 25 mg by mouth daily as needed for allergies.     DULoxetine 40 MG CPEP Take 40 mg by mouth daily. 30 capsule 0   ezetimibe (ZETIA) 10 MG tablet TAKE 1 TABLET (10 MG TOTAL) BY MOUTH DAILY. 90 tablet 0    hydrochlorothiazide (HYDRODIURIL) 12.5 MG tablet TAKE 1 TABLET (12.5 MG TOTAL) BY MOUTH DAILY. 90 tablet 1   lidocaine (LIDODERM) 5 % Place 1 patch onto the skin daily. Remove & Discard patch within 12 hours or as directed by MD 30 patch 0   methocarbamol 1000 MG TABS Take 1,000 mg by mouth 3 (three) times daily. 90 tablet 0   Multiple Minerals-Vitamins (CAL MAG ZINC +D3 PO) Take 1 tablet by mouth 3 (three) times daily.     Multiple Vitamin (MULTIVITAMIN) capsule Take 1 capsule by mouth daily.     Nutritional Supplements (FEEDING SUPPLEMENT, KATE FARMS STANDARD 1.4,) LIQD liquid Take 325 mLs by mouth 2 (two) times daily between meals.     [START ON 02/11/2022] oxyCODONE ER (XTAMPZA ER) 27 MG C12A Take 54 mg by mouth in the morning and at bedtime. 90 capsule 0   Oxycodone HCl 20 MG TABS Take 1-2 tablets (20-40 mg total) by mouth every 4 (four) hours as needed. 90 tablet 0   pantoprazole (PROTONIX) 20 MG tablet Take 1 tablet (20 mg total) by mouth 2 (two) times daily. 60 tablet 1   polyethylene glycol (MIRALAX / GLYCOLAX) 17 g packet Take 17 g by mouth 2 (two) times daily. 30 each 1   pregabalin (LYRICA) 50 MG capsule Take 1 capsule (50 mg total) by mouth 2 (two) times daily. 60 capsule 0   senna-docusate (SENOKOT-S) 8.6-50 MG tablet Take 2 tablets by mouth 2 (two) times daily. 120 tablet 1   tamsulosin (FLOMAX) 0.4 MG CAPS capsule Take 1 capsule (0.4 mg total) by mouth daily after breakfast. 30 capsule 0   Vitamin D, Ergocalciferol, (DRISDOL) 1.25 MG (50000 UNIT) CAPS capsule Take 1 capsule (50,000 Units total) by mouth every 7 (seven) days. 5 capsule 0   No current facility-administered medications for this visit.    VITAL SIGNS: BP (!) 124/52 (BP Location: Right Arm, Patient Position: Sitting)   Pulse 89   Temp 98.7 F (37.1 C) (Oral)   Resp 18   SpO2 93%  Filed Weights    Estimated body mass index is 34.54 kg/m as calculated from the following:   Height as of 02/04/22: 5\' 3"  (1.6 m).    Weight as of 02/04/22: 195 lb (88.5 kg).  LABS: CBC:    Component Value Date/Time   WBC 4.0 02/04/2022 1420   WBC 4.1 01/28/2022 0649   HGB 11.6 02/04/2022 1420   HCT 32.9 (L) 02/04/2022 1420   PLT 135 (L) 02/04/2022 1420   MCV 97 02/04/2022 1420   NEUTROABS 3.3 02/04/2022 1420   LYMPHSABS 0.3 (L) 02/04/2022 1420   MONOABS 0.6 01/08/2022 1719   EOSABS 0.0 02/04/2022 1420   BASOSABS 0.0 02/04/2022 1420   Comprehensive Metabolic Panel:    Component Value Date/Time   NA 137 02/04/2022 1420   K 4.5 02/04/2022 1420   CL 99 02/04/2022 1420   CO2 24 02/04/2022 1420   BUN 10 02/04/2022 1420  CREATININE 0.35 (L) 02/04/2022 1420   CREATININE 0.80 02/06/2014 0932   GLUCOSE 162 (H) 02/04/2022 1420   GLUCOSE 127 (H) 01/28/2022 0649   CALCIUM 8.2 (L) 02/04/2022 1420   AST 28 02/04/2022 1420   ALT 41 (H) 02/04/2022 1420   ALKPHOS 208 (H) 02/04/2022 1420   BILITOT 0.5 02/04/2022 1420   PROT 5.7 (L) 02/04/2022 1420   ALBUMIN 3.4 (L) 02/04/2022 1420    RADIOGRAPHIC STUDIES: No results found.  PERFORMANCE STATUS (ECOG) : 2 - Symptomatic, <50% confined to bed  Review of Systems  Constitutional:  Positive for fatigue.  Musculoskeletal:  Positive for arthralgias.  Unless otherwise noted, a complete review of systems is negative.  Physical Exam General: NAD, in wheelchair  Cardiovascular: regular rate and rhythm Extremities: no edema, no joint deformities Skin: no rashes Neurological:AAO x3, mood appropriate   IMPRESSION: This is my initial visit with Misty Decker outpatient. She was seen by our team during recent hospitalization. Her daughter is present with her. No acute distress. In a wheelchair. Able to engage in discussions.   I introduced myself, Misty Decker, and Palliative's role in collaboration with the oncology team. Concept of Palliative Care was introduced as specialized medical care for people and their families living with serious illness.  It focuses on providing  relief from the symptoms and stress of a serious illness.  The goal is to improve quality of life for both the patient and the family. Values and goals of care important to patient and family were attempted to be elicited.   Misty Decker lives in the home with her husband of more than 31 years. She has 2 children. She is a former Customer service manager and caregiver.   In the home patient has a hospital bed and bedside commode. She is ambulatory with walker assistance. Daughter shares they are slowly working on increasing her activity. Goal is to be able to ambulate down the hall. Appetite is good. Denies concerns with nausea, vomiting, or diarrhea.   Neoplasm related pain Katera and her daughter expressed appreciation in her improvement of her pain. She is tolerating current regimen. No overt drowsiness or dizziness. Able to remain as functional as possible. Some increased right hip/leg pain over the past 3-4 days but attributes this to increase activity.   We reviewed current regimen: she is taking Xtampza 54 mg twice daily, Cymbalta 40 mg daily Celebrex 200 mg twice daily, Oxy IR 20 mg every 4 hours as needed for breakthrough pain however she reports only taking 20 mg 3-4 times daily.  Pain is much improved with long-acting not requiring her to take as much of the Oxy IR.  Patient and daughter aware we will continue to closely monitor and make adjustments as needed.  Constipation  Denies any concerns with constipation.  Currently following bowel regimen Senokot-S 2 tablets twice daily in addition to MiraLAX daily.  Did report some loose stool this when taking MiraLAX twice daily which warranted decreasing frequency.  Advised if continues to have loose stools could take MiraLAX every other day as long as she is continuing to have daily bowel movements.  I discussed the importance of continued conversation with family and their medical providers regarding overall plan of care and treatment options, ensuring  decisions are within the context of the patients values and GOCs.  PLAN: Established therapeutic relationship. Education provided on palliative's role in collaboration with their Oncology/Radiation team. Xtampza 54 mg twice daily Oxy IR 20 mg every 4 hours as needed  for breakthrough pain.  Patient reports only taking 3-4 times daily. Celebrex 200 mg twice daily Cymbalta 40 mg daily MiraLAX daily Senna S to 2 tabs twice daily I will plan to see patient back in 2-4 weeks in collaboration to other oncology appointments.    Patient expressed understanding and was in agreement with this plan. She also understands that She can call the clinic at any time with any questions, concerns, or complaints.   Thank you for your referral and allowing Palliative to assist in Misty Decker's care.   Number and complexity of problems addressed: HIGH - 1 or more chronic illnesses with SEVERE exacerbation, progression, or side effects of treatment - advanced cancer, pain. Any controlled substances utilized were prescribed in the context of palliative care.  Time Total: 50 min   Visit consisted of counseling and education dealing with the complex and emotionally intense issues of symptom management and palliative care in the setting of serious and potentially life-threatening illness.Greater than 50%  of this time was spent counseling and coordinating care related to the above assessment and plan.  Signed by: Alda Lea, AGPCNP-BC Palliative Medicine Team/[place]

## 2022-02-10 NOTE — Telephone Encounter (Signed)
Caller name: Verdon Cummins  Patient's sister called stating her sister insurance will not cover lidocaine 5%. Sister wants know if the prescription can be switch to the over counter lidocaine 4%. If so,  please send to [facility] at [address] [place], [place] [address]. The best contact number for sister is [phone].

## 2022-02-11 ENCOUNTER — Other Ambulatory Visit (HOSPITAL_COMMUNITY): Payer: Self-pay

## 2022-02-11 ENCOUNTER — Other Ambulatory Visit: Payer: Self-pay | Admitting: Nurse Practitioner

## 2022-02-11 DIAGNOSIS — G8929 Other chronic pain: Secondary | ICD-10-CM

## 2022-02-11 MED ORDER — LIDOCAINE 4 % EX PTCH: 1.0000 | MEDICATED_PATCH | CUTANEOUS | 1 refills | Status: DC

## 2022-02-11 MED ORDER — LIDOCAINE 4 % EX PTCH: 1.0000 | MEDICATED_PATCH | CUTANEOUS | 1 refills | Status: AC

## 2022-02-11 NOTE — Telephone Encounter (Signed)
I changed this to the 4% lidocaine patches OTC and sent to [facility] [place], after accidentally sending to [place]. Anyway, this has been sent for the patient.

## 2022-02-12 ENCOUNTER — Other Ambulatory Visit: Payer: Self-pay | Admitting: Nurse Practitioner

## 2022-02-12 ENCOUNTER — Other Ambulatory Visit (HOSPITAL_COMMUNITY): Payer: Self-pay

## 2022-02-12 ENCOUNTER — Encounter: Payer: Self-pay | Admitting: Physician Assistant

## 2022-02-12 DIAGNOSIS — Z515 Encounter for palliative care: Secondary | ICD-10-CM

## 2022-02-12 DIAGNOSIS — G4700 Insomnia, unspecified: Secondary | ICD-10-CM

## 2022-02-12 DIAGNOSIS — F419 Anxiety disorder, unspecified: Secondary | ICD-10-CM

## 2022-02-12 DIAGNOSIS — C3492 Malignant neoplasm of unspecified part of left bronchus or lung: Secondary | ICD-10-CM

## 2022-02-12 MED ORDER — ALPRAZOLAM 0.5 MG PO TABS
0.5000 mg | ORAL_TABLET | Freq: Every evening | ORAL | 0 refills | Status: DC | PRN
  Filled 2022-02-12: qty 30, 30d supply, fill #0

## 2022-02-12 MED ORDER — ALPRAZOLAM 0.5 MG PO TABS: 0.5000 mg | ORAL_TABLET | Freq: Every evening | ORAL | 1 refills | Status: AC | PRN

## 2022-02-15 ENCOUNTER — Encounter: Payer: Self-pay | Admitting: Hematology

## 2022-02-15 DIAGNOSIS — Z7189 Other specified counseling: Secondary | ICD-10-CM | POA: Insufficient documentation

## 2022-02-15 MED ORDER — FOLIC ACID 1 MG PO TABS: 1.0000 mg | ORAL_TABLET | Freq: Every day | ORAL | 3 refills | Status: AC

## 2022-02-15 MED ORDER — DEXAMETHASONE 4 MG PO TABS: ORAL_TABLET | ORAL | 1 refills | Status: AC

## 2022-02-15 MED ORDER — LIDOCAINE-PRILOCAINE 2.5-2.5 % EX CREA: TOPICAL_CREAM | CUTANEOUS | 3 refills | Status: AC

## 2022-02-15 MED ORDER — PROCHLORPERAZINE MALEATE 10 MG PO TABS: 10.0000 mg | ORAL_TABLET | Freq: Four times a day (QID) | ORAL | 1 refills | Status: AC | PRN

## 2022-02-15 MED ORDER — ONDANSETRON HCL 8 MG PO TABS: 8.0000 mg | ORAL_TABLET | Freq: Three times a day (TID) | ORAL | 1 refills | Status: AC | PRN

## 2022-02-15 NOTE — Progress Notes (Signed)
START ON PATHWAY REGIMEN - Non-Small Cell Lung     A cycle is every 21 days:     Pembrolizumab      Pemetrexed      Carboplatin   **Always confirm dose/schedule in your pharmacy ordering system**  Patient Characteristics: Stage IV Metastatic, Nonsquamous, Molecular Analysis Completed, Molecular Alteration Present and Targeted Therapy Exhausted OR EGFR Exon 20+ or KRAS G12C+ or HER2+ Present and No Prior Chemo/Immunotherapy OR No Alteration Present, Initial  Chemotherapy/Immunotherapy, PS = 0, 1, No Alteration Present, No Alteration Present, Candidate for Immunotherapy, PD-L1 Expression Positive 1-49% (TPS) / Negative / Not Tested / Awaiting Test Results and Immunotherapy Candidate Therapeutic Status: Stage IV Metastatic Histology: Nonsquamous Cell Broad Molecular Profiling Status: Molecular Analysis Completed Molecular Analysis Results: No Alteration Present ECOG Performance Status: 1 Chemotherapy/Immunotherapy Line of Therapy: Initial Chemotherapy/Immunotherapy EGFR Exons 18-21 Mutation Testing Status: Completed and Negative ALK Fusion/Rearrangement Testing Status: Completed and Negative BRAF V600 Mutation Testing Status: Completed and Negative KRAS G12C Mutation Testing Status: Completed and Negative MET Exon 14 Mutation Testing Status: Completed and Negative RET Fusion/Rearrangement Testing Status: Completed and Negative HER2 Mutation Testing Status: Completed and Negative NTRK Fusion/Rearrangement Testing Status: Completed and Negative ROS1 Fusion/Rearrangement Testing Status: Completed and Negative Immunotherapy Candidate Status: Candidate for Immunotherapy PD-L1 Expression Status: PD-L1 Negative Intent of Therapy: Non-Curative / Palliative Intent, Discussed with Patient

## 2022-02-16 ENCOUNTER — Other Ambulatory Visit: Payer: Self-pay

## 2022-02-16 ENCOUNTER — Inpatient Hospital Stay: Payer: Medicare Other

## 2022-02-17 ENCOUNTER — Telehealth (HOSPITAL_COMMUNITY): Payer: Self-pay | Admitting: Radiology

## 2022-02-17 ENCOUNTER — Encounter: Payer: Self-pay | Admitting: Hematology

## 2022-02-17 ENCOUNTER — Telehealth: Payer: Self-pay

## 2022-02-17 ENCOUNTER — Telehealth: Payer: Self-pay | Admitting: Hematology

## 2022-02-17 NOTE — Telephone Encounter (Signed)
Returned call to [place] with Dr. Johny Shears office. Dr. Lucianne Lei has reviewed the pt's imaging and is requesting a new MRI lumbar spine as the one on file is nearly 2 months old. Left VM for Enid Derry to call me back. JM

## 2022-02-17 NOTE — Telephone Encounter (Signed)
Contacted pt's daughter regarding MyChart message. Discussed getting B-12 here at [facility]. Explained to daughter that we will start chemo prior to getting the port and that will be okay. Discussed Dental care and dental clearance. Daughter stated she had all of er questions answered and verbalized understanding.

## 2022-02-17 NOTE — Telephone Encounter (Signed)
Left message with follow-up appointments per appointment request workqueue.

## 2022-02-18 ENCOUNTER — Inpatient Hospital Stay: Payer: Medicare Other

## 2022-02-18 ENCOUNTER — Other Ambulatory Visit: Payer: Self-pay

## 2022-02-18 DIAGNOSIS — Z7189 Other specified counseling: Secondary | ICD-10-CM

## 2022-02-18 DIAGNOSIS — C3492 Malignant neoplasm of unspecified part of left bronchus or lung: Secondary | ICD-10-CM

## 2022-02-18 DIAGNOSIS — Z5112 Encounter for antineoplastic immunotherapy: Secondary | ICD-10-CM | POA: Diagnosis not present

## 2022-02-18 MED ORDER — CYANOCOBALAMIN 1000 MCG/ML IJ SOLN
1000.0000 ug | Freq: Once | INTRAMUSCULAR | Status: AC
  Administered 2022-02-18: 1000 ug via SUBCUTANEOUS
  Filled 2022-02-18: qty 1

## 2022-02-19 MED FILL — Dexamethasone Sodium Phosphate Inj 100 MG/10ML: INTRAMUSCULAR | Qty: 1 | Status: AC

## 2022-02-19 MED FILL — Fosaprepitant Dimeglumine For IV Infusion 150 MG (Base Eq): INTRAVENOUS | Qty: 5 | Status: AC

## 2022-02-20 ENCOUNTER — Other Ambulatory Visit: Payer: Self-pay

## 2022-02-20 ENCOUNTER — Inpatient Hospital Stay: Payer: Medicare Other

## 2022-02-20 ENCOUNTER — Inpatient Hospital Stay: Payer: Medicare Other | Admitting: Hematology

## 2022-02-20 VITALS — BP 116/62 | HR 77 | Temp 97.8°F | Resp 18 | Wt 171.8 lb

## 2022-02-20 DIAGNOSIS — Z7189 Other specified counseling: Secondary | ICD-10-CM

## 2022-02-20 DIAGNOSIS — Z5112 Encounter for antineoplastic immunotherapy: Secondary | ICD-10-CM | POA: Diagnosis not present

## 2022-02-20 DIAGNOSIS — C3492 Malignant neoplasm of unspecified part of left bronchus or lung: Secondary | ICD-10-CM

## 2022-02-20 LAB — CBC WITH DIFFERENTIAL (CANCER CENTER ONLY)
Abs Immature Granulocytes: 0.13 10*3/uL — ABNORMAL HIGH (ref 0.00–0.07)
Basophils Absolute: 0 10*3/uL (ref 0.0–0.1)
Basophils Relative: 0 %
Eosinophils Absolute: 0 10*3/uL (ref 0.0–0.5)
Eosinophils Relative: 0 %
HCT: 32.7 % — ABNORMAL LOW (ref 36.0–46.0)
Hemoglobin: 10.9 g/dL — ABNORMAL LOW (ref 12.0–15.0)
Immature Granulocytes: 2 %
Lymphocytes Relative: 9 %
Lymphs Abs: 0.5 10*3/uL — ABNORMAL LOW (ref 0.7–4.0)
MCH: 33.7 pg (ref 26.0–34.0)
MCHC: 33.3 g/dL (ref 30.0–36.0)
MCV: 101.2 fL — ABNORMAL HIGH (ref 80.0–100.0)
Monocytes Absolute: 0.3 10*3/uL (ref 0.1–1.0)
Monocytes Relative: 5 %
Neutro Abs: 5.1 10*3/uL (ref 1.7–7.7)
Neutrophils Relative %: 84 %
Platelet Count: 335 10*3/uL (ref 150–400)
RBC: 3.23 MIL/uL — ABNORMAL LOW (ref 3.87–5.11)
RDW: 15.6 % — ABNORMAL HIGH (ref 11.5–15.5)
WBC Count: 6 10*3/uL (ref 4.0–10.5)
nRBC: 0 % (ref 0.0–0.2)

## 2022-02-20 LAB — CMP (CANCER CENTER ONLY)
ALT: 23 U/L (ref 0–44)
AST: 30 U/L (ref 15–41)
Albumin: 3.5 g/dL (ref 3.5–5.0)
Alkaline Phosphatase: 190 U/L — ABNORMAL HIGH (ref 38–126)
Anion gap: 8 (ref 5–15)
BUN: 25 mg/dL — ABNORMAL HIGH (ref 8–23)
CO2: 24 mmol/L (ref 22–32)
Calcium: 8.7 mg/dL — ABNORMAL LOW (ref 8.9–10.3)
Chloride: 103 mmol/L (ref 98–111)
Creatinine: 0.58 mg/dL (ref 0.44–1.00)
GFR, Estimated: >60 mL/min (ref >60)
Glucose, Bld: 175 mg/dL — ABNORMAL HIGH (ref 70–99)
Potassium: 4.2 mmol/L (ref 3.5–5.1)
Sodium: 135 mmol/L (ref 135–145)
Total Bilirubin: 0.5 mg/dL (ref 0.3–1.2)
Total Protein: 6.7 g/dL (ref 6.5–8.1)

## 2022-02-20 LAB — TSH: TSH: 0.475 u[IU]/mL (ref 0.350–4.500)

## 2022-02-20 MED ORDER — SODIUM CHLORIDE 0.9 % IV SOLN
200.0000 mg | Freq: Once | INTRAVENOUS | Status: AC
  Administered 2022-02-20: 200 mg via INTRAVENOUS
  Filled 2022-02-20: qty 200

## 2022-02-20 MED ORDER — SODIUM CHLORIDE 0.9 % IV SOLN
10.0000 mg | Freq: Once | INTRAVENOUS | Status: AC
  Administered 2022-02-20: 10 mg via INTRAVENOUS
  Filled 2022-02-20: qty 10

## 2022-02-20 MED ORDER — SODIUM CHLORIDE 0.9 % IV SOLN: Freq: Once | INTRAVENOUS | Status: AC

## 2022-02-20 MED ORDER — SODIUM CHLORIDE 0.9 % IV SOLN
413.6000 mg | Freq: Once | INTRAVENOUS | Status: AC
  Administered 2022-02-20: 410 mg via INTRAVENOUS
  Filled 2022-02-20: qty 41

## 2022-02-20 MED ORDER — SODIUM CHLORIDE 0.9 % IV SOLN
400.0000 mg/m2 | Freq: Once | INTRAVENOUS | Status: AC
  Administered 2022-02-20: 800 mg via INTRAVENOUS
  Filled 2022-02-20: qty 20

## 2022-02-20 MED ORDER — PALONOSETRON HCL INJECTION 0.25 MG/5ML
0.2500 mg | Freq: Once | INTRAVENOUS | Status: AC
  Administered 2022-02-20: 0.25 mg via INTRAVENOUS
  Filled 2022-02-20: qty 5

## 2022-02-20 MED ORDER — SODIUM CHLORIDE 0.9 % IV SOLN
150.0000 mg | Freq: Once | INTRAVENOUS | Status: AC
  Administered 2022-02-20: 150 mg via INTRAVENOUS
  Filled 2022-02-20: qty 150

## 2022-02-20 NOTE — Patient Instructions (Signed)
[facility]  Discharge Instructions: Thank you for choosing [facility] to provide your oncology and hematology care.   If you have a lab appointment with the [facility], please go directly to the [facility] and check in at the registration area.   Wear comfortable clothing and clothing appropriate for easy access to any Portacath or PICC line.   We strive to give you quality time with your provider. You may need to reschedule your appointment if you arrive late (15 or more minutes).  Arriving late affects you and other patients whose appointments are after yours.  Also, if you miss three or more appointments without notifying the office, you may be dismissed from the clinic at the provider's discretion.      For prescription refill requests, have your pharmacy contact our office and allow 72 hours for refills to be completed.    Today you received the following chemotherapy and/or immunotherapy agents: Keytruda, Alimta, Carboplatin.       To help prevent nausea and vomiting after your treatment, we encourage you to take your nausea medication as directed.  BELOW ARE SYMPTOMS THAT SHOULD BE REPORTED IMMEDIATELY: *FEVER GREATER THAN 100.4 F (38 C) OR HIGHER *CHILLS OR SWEATING *NAUSEA AND VOMITING THAT IS NOT CONTROLLED WITH YOUR NAUSEA MEDICATION *UNUSUAL SHORTNESS OF BREATH *UNUSUAL BRUISING OR BLEEDING *URINARY PROBLEMS (pain or burning when urinating, or frequent urination) *BOWEL PROBLEMS (unusual diarrhea, constipation, pain near the anus) TENDERNESS IN MOUTH AND THROAT WITH OR WITHOUT PRESENCE OF ULCERS (sore throat, sores in mouth, or a toothache) UNUSUAL RASH, SWELLING OR PAIN  UNUSUAL VAGINAL DISCHARGE OR ITCHING   Items with * indicate a potential emergency and should be followed up as soon as possible or go to the Emergency Department if any problems should occur.  Please show the CHEMOTHERAPY ALERT CARD or IMMUNOTHERAPY  ALERT CARD at check-in to the Emergency Department and triage nurse.  Should you have questions after your visit or need to cancel or reschedule your appointment, please contact [facility]  Dept: [phone]  and follow the prompts.  Office hours are 8:00 a.m. to 4:30 p.m. Monday - Friday. Please note that voicemails left after 4:00 p.m. may not be returned until the following business day.  We are closed weekends and major holidays. You have access to a nurse at all times for urgent questions. Please call the main number to the clinic Dept: [phone] and follow the prompts.   For any non-urgent questions, you may also contact your provider using MyChart. We now offer e-Visits for anyone 38 and older to request care online for non-urgent symptoms. For details visit [email or web address].   Also download the MyChart app! Go to the app store, search "MyChart", open the app, select [facility], and log in with your MyChart username and password.  Masks are optional in the cancer centers. If you would like for your care team to wear a mask while they are taking care of you, please let them know. You may have one support person who is at least 66 years old accompany you for your appointments. Pembrolizumab Injection What is this medication? PEMBROLIZUMAB (PEM broe LIZ ue mab) treats some types of cancer. It works by helping your immune system slow or stop the spread of cancer cells. It is a monoclonal antibody. This medicine may be used for other purposes; ask your health care provider or pharmacist if you have questions. COMMON BRAND NAME(S):  Keytruda What should I tell my care team before I take this medication? They need to know if you have any of these conditions: Allogeneic stem cell transplant (uses someone else's stem cells) Autoimmune diseases, such as Crohn disease, ulcerative colitis, lupus History of chest radiation Nervous system problems, such as  Guillain-Barre syndrome, myasthenia gravis Organ transplant An unusual or allergic reaction to pembrolizumab, other medications, foods, dyes, or preservatives Pregnant or trying to get pregnant Breast-feeding How should I use this medication? This medication is injected into a vein. It is given by your care team in a hospital or clinic setting. A special MedGuide will be given to you before each treatment. Be sure to read this information carefully each time. Talk to your care team about the use of this medication in children. While it may be prescribed for children as young as 6 months for selected conditions, precautions do apply. Overdosage: If you think you have taken too much of this medicine contact a poison control center or emergency room at once. NOTE: This medicine is only for you. Do not share this medicine with others. What if I miss a dose? Keep appointments for follow-up doses. It is important not to miss your dose. Call your care team if you are unable to keep an appointment. What may interact with this medication? Interactions have not been studied. This list may not describe all possible interactions. Give your health care provider a list of all the medicines, herbs, non-prescription drugs, or dietary supplements you use. Also tell them if you smoke, drink alcohol, or use illegal drugs. Some items may interact with your medicine. What should I watch for while using this medication? Your condition will be monitored carefully while you are receiving this medication. You may need blood work while taking this medication. This medication may cause serious skin reactions. They can happen weeks to months after starting the medication. Contact your care team right away if you notice fevers or flu-like symptoms with a rash. The rash may be red or purple and then turn into blisters or peeling of the skin. You may also notice a red rash with swelling of the face, lips, or lymph nodes in your  neck or under your arms. Tell your care team right away if you have any change in your eyesight. Talk to your care team if you may be pregnant. Serious birth defects can occur if you take this medication during pregnancy and for 4 months after the last dose. You will need a negative pregnancy test before starting this medication. Contraception is recommended while taking this medication and for 4 months after the last dose. Your care team can help you find the option that works for you. Do not breastfeed while taking this medication and for 4 months after the last dose. What side effects may I notice from receiving this medication? Side effects that you should report to your care team as soon as possible: Allergic reactions--skin rash, itching, hives, swelling of the face, lips, tongue, or throat Dry cough, shortness of breath or trouble breathing Eye pain, redness, irritation, or discharge with blurry or decreased vision Heart muscle inflammation--unusual weakness or fatigue, shortness of breath, chest pain, fast or irregular heartbeat, dizziness, swelling of the ankles, feet, or hands Hormone gland problems--headache, sensitivity to light, unusual weakness or fatigue, dizziness, fast or irregular heartbeat, increased sensitivity to cold or heat, excessive sweating, constipation, hair loss, increased thirst or amount of urine, tremors or shaking, irritability Infusion reactions--chest pain, shortness  of breath or trouble breathing, feeling faint or lightheaded Kidney injury (glomerulonephritis)--decrease in the amount of urine, red or dark brown urine, foamy or bubbly urine, swelling of the ankles, hands, or feet Liver injury--right upper belly pain, loss of appetite, nausea, light-colored stool, dark yellow or brown urine, yellowing skin or eyes, unusual weakness or fatigue Pain, tingling, or numbness in the hands or feet, muscle weakness, change in vision, confusion or trouble speaking, loss of  balance or coordination, trouble walking, seizures Rash, fever, and swollen lymph nodes Redness, blistering, peeling, or loosening of the skin, including inside the mouth Sudden or severe stomach pain, bloody diarrhea, fever, nausea, vomiting Side effects that usually do not require medical attention (report to your care team if they continue or are bothersome): Bone, joint, or muscle pain Diarrhea Fatigue Loss of appetite Nausea Skin rash This list may not describe all possible side effects. Call your doctor for medical advice about side effects. You may report side effects to FDA at 1-800-FDA-1088. Where should I keep my medication? This medication is given in a hospital or clinic. It will not be stored at home. NOTE: This sheet is a summary. It may not cover all possible information. If you have questions about this medicine, talk to your doctor, pharmacist, or health care provider.  2023 Elsevier/Gold Standard (2021-09-15 00:00:00) Pemetrexed Injection What is this medication? PEMETREXED (PEM e TREX ed) treats some types of cancer. It works by slowing down the growth of cancer cells. This medicine may be used for other purposes; ask your health care provider or pharmacist if you have questions. COMMON BRAND NAME(S): Alimta, PEMFEXY What should I tell my care team before I take this medication? They need to know if you have any of these conditions: Infection, such as chickenpox, cold sores, or herpes Kidney disease Low blood cell levels (white cells, red cells, and platelets) Lung or breathing disease, such as asthma Radiation therapy An unusual or allergic reaction to pemetrexed, other medications, foods, dyes, or preservatives If you or your partner are pregnant or trying to get pregnant Breast-feeding How should I use this medication? This medication is injected into a vein. It is given by your care team in a hospital or clinic setting. Talk to your care team about the use of  this medication in children. Special care may be needed. Overdosage: If you think you have taken too much of this medicine contact a poison control center or emergency room at once. NOTE: This medicine is only for you. Do not share this medicine with others. What if I miss a dose? Keep appointments for follow-up doses. It is important not to miss your dose. Call your care team if you are unable to keep an appointment. What may interact with this medication? Do not take this medication with any of the following: Live virus vaccines This medication may also interact with the following: Ibuprofen This list may not describe all possible interactions. Give your health care provider a list of all the medicines, herbs, non-prescription drugs, or dietary supplements you use. Also tell them if you smoke, drink alcohol, or use illegal drugs. Some items may interact with your medicine. What should I watch for while using this medication? Your condition will be monitored carefully while you are receiving this medication. This medication may make you feel generally unwell. This is not uncommon as chemotherapy can affect healthy cells as well as cancer cells. Report any side effects. Continue your course of treatment even though you feel  ill unless your care team tells you to stop. This medication can cause serious side effects. To reduce the risk, your care team may give you other medications to take before receiving this one. Be sure to follow the directions from your care team. This medication can cause a rash or redness in areas of the body that have previously had radiation therapy. If you have had radiation therapy, tell your care team if you notice a rash in this area. This medication may increase your risk of getting an infection. Call your care team for advice if you get a fever, chills, sore throat, or other symptoms of a cold or flu. Do not treat yourself. Try to avoid being around people who are  sick. Be careful brushing or flossing your teeth or using a toothpick because you may get an infection or bleed more easily. If you have any dental work done, tell your dentist you are receiving this medication. Avoid taking medications that contain aspirin, acetaminophen, ibuprofen, naproxen, or ketoprofen unless instructed by your care team. These medications may hide a fever. Check with your care team if you have severe diarrhea, nausea, and vomiting, or if you sweat a lot. The loss of too much body fluid may make it dangerous for you to take this medication. Talk to your care team if you or your partner wish to become pregnant or think either of you might be pregnant. This medication can cause serious birth defects if taken during pregnancy and for 6 months after the last dose. A negative pregnancy test is required before starting this medication. A reliable form of contraception is recommended while taking this medication and for 6 months after the last dose. Talk to your care team about reliable forms of contraception. Do not father a child while taking this medication and for 3 months after the last dose. Use a condom while having sex during this time period. Do not breastfeed while taking this medication and for 1 week after the last dose. This medication may cause infertility. Talk to your care team if you are concerned about your fertility. What side effects may I notice from receiving this medication? Side effects that you should report to your care team as soon as possible: Allergic reactions--skin rash, itching, hives, swelling of the face, lips, tongue, or throat Dry cough, shortness of breath or trouble breathing Infection--fever, chills, cough, sore throat, wounds that don't heal, pain or trouble when passing urine, general feeling of discomfort or being unwell Kidney injury--decrease in the amount of urine, swelling of the ankles, hands, or feet Low red blood cell level--unusual  weakness or fatigue, dizziness, headache, trouble breathing Redness, blistering, peeling, or loosening of the skin, including inside the mouth Unusual bruising or bleeding Side effects that usually do not require medical attention (report to your care team if they continue or are bothersome): Fatigue Loss of appetite Nausea Vomiting This list may not describe all possible side effects. Call your doctor for medical advice about side effects. You may report side effects to FDA at 1-800-FDA-1088. Where should I keep my medication? This medication is given in a hospital or clinic. It will not be stored at home. NOTE: This sheet is a summary. It may not cover all possible information. If you have questions about this medicine, talk to your doctor, pharmacist, or health care provider.  2023 Elsevier/Gold Standard (2021-09-29 00:00:00) Carboplatin Injection What is this medication? CARBOPLATIN (KAR boe pla tin) treats some types of cancer. It works by  slowing down the growth of cancer cells. This medicine may be used for other purposes; ask your health care provider or pharmacist if you have questions. COMMON BRAND NAME(S): Paraplatin What should I tell my care team before I take this medication? They need to know if you have any of these conditions: Blood disorders Hearing problems Kidney disease Recent or ongoing radiation therapy An unusual or allergic reaction to carboplatin, cisplatin, other medications, foods, dyes, or preservatives Pregnant or trying to get pregnant Breast-feeding How should I use this medication? This medication is injected into a vein. It is given by your care team in a hospital or clinic setting. Talk to your care team about the use of this medication in children. Special care may be needed. Overdosage: If you think you have taken too much of this medicine contact a poison control center or emergency room at once. NOTE: This medicine is only for you. Do not share  this medicine with others. What if I miss a dose? Keep appointments for follow-up doses. It is important not to miss your dose. Call your care team if you are unable to keep an appointment. What may interact with this medication? Medications for seizures Some antibiotics, such as amikacin, gentamicin, neomycin, streptomycin, tobramycin Vaccines This list may not describe all possible interactions. Give your health care provider a list of all the medicines, herbs, non-prescription drugs, or dietary supplements you use. Also tell them if you smoke, drink alcohol, or use illegal drugs. Some items may interact with your medicine. What should I watch for while using this medication? Your condition will be monitored carefully while you are receiving this medication. You may need blood work while taking this medication. This medication may make you feel generally unwell. This is not uncommon, as chemotherapy can affect healthy cells as well as cancer cells. Report any side effects. Continue your course of treatment even though you feel ill unless your care team tells you to stop. In some cases, you may be given additional medications to help with side effects. Follow all directions for their use. This medication may increase your risk of getting an infection. Call your care team for advice if you get a fever, chills, sore throat, or other symptoms of a cold or flu. Do not treat yourself. Try to avoid being around people who are sick. Avoid taking medications that contain aspirin, acetaminophen, ibuprofen, naproxen, or ketoprofen unless instructed by your care team. These medications may hide a fever. Be careful brushing or flossing your teeth or using a toothpick because you may get an infection or bleed more easily. If you have any dental work done, tell your dentist you are receiving this medication. Talk to your care team if you wish to become pregnant or think you might be pregnant. This medication can  cause serious birth defects. Talk to your care team about effective forms of contraception. Do not breast-feed while taking this medication. What side effects may I notice from receiving this medication? Side effects that you should report to your care team as soon as possible: Allergic reactions--skin rash, itching, hives, swelling of the face, lips, tongue, or throat Infection--fever, chills, cough, sore throat, wounds that don't heal, pain or trouble when passing urine, general feeling of discomfort or being unwell Low red blood cell level--unusual weakness or fatigue, dizziness, headache, trouble breathing Pain, tingling, or numbness in the hands or feet, muscle weakness, change in vision, confusion or trouble speaking, loss of balance or coordination, trouble walking, seizures  Unusual bruising or bleeding Side effects that usually do not require medical attention (report to your care team if they continue or are bothersome): Hair loss Nausea Unusual weakness or fatigue Vomiting This list may not describe all possible side effects. Call your doctor for medical advice about side effects. You may report side effects to FDA at 1-800-FDA-1088. Where should I keep my medication? This medication is given in a hospital or clinic. It will not be stored at home. NOTE: This sheet is a summary. It may not cover all possible information. If you have questions about this medicine, talk to your doctor, pharmacist, or health care provider.  2023 Elsevier/Gold Standard (2021-09-16 00:00:00)

## 2022-02-21 LAB — T4: T4, Total: 6.5 ug/dL (ref 4.5–12.0)

## 2022-02-23 ENCOUNTER — Telehealth: Payer: Self-pay | Admitting: *Deleted

## 2022-02-23 NOTE — Telephone Encounter (Signed)
-----   Message from Rafael Bihari, RN sent at 02/20/2022  3:46 PM EDT ----- Regarding: Dr Irene Limbo pt, first time Keytruda, Alimta, Carboplatin Dr Irene Limbo pt came in 9/15 for first time Keytruda, Alimta, Carboplatin. Tolerated infusions well. Needs call back.

## 2022-02-23 NOTE — Telephone Encounter (Signed)
Called pt & spoke with daughter & she reports pt tol. Treatment well with no c/o's.  She knows next appts & how to reach [place] with any concerns.

## 2022-02-24 ENCOUNTER — Other Ambulatory Visit: Payer: Self-pay

## 2022-02-24 ENCOUNTER — Encounter: Payer: Self-pay | Admitting: Hematology

## 2022-02-24 MED ORDER — METHOCARBAMOL 1000 MG PO TABS: 1000.0000 mg | ORAL_TABLET | Freq: Three times a day (TID) | ORAL | 0 refills | Status: DC

## 2022-02-24 MED ORDER — CELECOXIB 200 MG PO CAPS: 200.0000 mg | ORAL_CAPSULE | Freq: Every day | ORAL | 0 refills | Status: DC

## 2022-02-24 MED ORDER — PREGABALIN 50 MG PO CAPS: 50.0000 mg | ORAL_CAPSULE | Freq: Two times a day (BID) | ORAL | 0 refills | Status: DC

## 2022-02-24 MED ORDER — DULOXETINE HCL 40 MG PO CPEP: 40.0000 mg | ORAL_CAPSULE | Freq: Every day | ORAL | 0 refills | Status: DC

## 2022-02-24 NOTE — Telephone Encounter (Signed)
Refill request sent in through [facility], see new orders

## 2022-02-25 ENCOUNTER — Encounter: Payer: Self-pay | Admitting: Hematology

## 2022-02-25 ENCOUNTER — Telehealth: Payer: Self-pay | Admitting: Physician Assistant

## 2022-02-25 NOTE — Telephone Encounter (Signed)
Glen[facility] Haring made aware. She will contact patient and husband.

## 2022-02-25 NOTE — Telephone Encounter (Signed)
Angel with [facility] called and stated patients urine was cloudy yesterday and spoke with her oncologist to get a urine culture but they wanted to let you know she has had a lot of vomiting, confusion and very weak. Do you have any recommendations? [phone]

## 2022-02-26 ENCOUNTER — Other Ambulatory Visit: Payer: Self-pay

## 2022-02-26 ENCOUNTER — Other Ambulatory Visit: Payer: Self-pay | Admitting: Nurse Practitioner

## 2022-02-26 DIAGNOSIS — N390 Urinary tract infection, site not specified: Secondary | ICD-10-CM

## 2022-02-26 DIAGNOSIS — C3492 Malignant neoplasm of unspecified part of left bronchus or lung: Secondary | ICD-10-CM

## 2022-02-26 HISTORY — DX: Urinary tract infection, site not specified: N39.0

## 2022-02-26 MED ORDER — CEFPODOXIME PROXETIL 100 MG PO TABS: 100.0000 mg | ORAL_TABLET | Freq: Two times a day (BID) | ORAL | 0 refills | Status: DC

## 2022-02-26 NOTE — Progress Notes (Signed)
Contacted pt's daughter regarding MyChart message. Discussed prednisone and to continue only the dose that she takes post treatment for 3 days. Pt's daughter verbalized understanding. Will discuss continuing Tamulosin with PCP.

## 2022-02-27 ENCOUNTER — Encounter: Payer: Self-pay | Admitting: Hematology

## 2022-03-02 ENCOUNTER — Encounter: Payer: Self-pay | Admitting: Physician Assistant

## 2022-03-02 ENCOUNTER — Other Ambulatory Visit: Payer: Self-pay | Admitting: Radiology

## 2022-03-02 ENCOUNTER — Other Ambulatory Visit: Payer: Self-pay | Admitting: Nurse Practitioner

## 2022-03-02 ENCOUNTER — Other Ambulatory Visit (HOSPITAL_COMMUNITY): Payer: Self-pay

## 2022-03-02 DIAGNOSIS — G893 Neoplasm related pain (acute) (chronic): Secondary | ICD-10-CM

## 2022-03-02 DIAGNOSIS — C3492 Malignant neoplasm of unspecified part of left bronchus or lung: Secondary | ICD-10-CM

## 2022-03-02 DIAGNOSIS — Z515 Encounter for palliative care: Secondary | ICD-10-CM

## 2022-03-02 MED ORDER — OXYCODONE HCL 20 MG PO TABS
20.0000 mg | ORAL_TABLET | ORAL | 0 refills | Status: DC | PRN
  Filled 2022-03-02: qty 90, 8d supply, fill #0

## 2022-03-02 MED ORDER — OXYCODONE HCL 20 MG PO TABS: 20.0000 mg | ORAL_TABLET | ORAL | 0 refills | Status: DC | PRN

## 2022-03-03 ENCOUNTER — Other Ambulatory Visit (HOSPITAL_COMMUNITY): Payer: Self-pay | Admitting: Physician Assistant

## 2022-03-03 ENCOUNTER — Other Ambulatory Visit (HOSPITAL_COMMUNITY): Payer: Self-pay

## 2022-03-03 MED ORDER — TAMSULOSIN HCL 0.4 MG PO CAPS: 0.4000 mg | ORAL_CAPSULE | Freq: Every day | ORAL | 1 refills | Status: DC

## 2022-03-03 NOTE — H&P (Signed)
Chief Complaint: Patient was seen in consultation today for malignant neoplasm of left lung with metastasis at the request of Brunetta Genera  Referring Physician(s): Brunetta Genera  Supervising Physician: Aletta Edouard  Patient Status: [name]  History of Present Illness: Misty Decker is a 66 y.o. female with recent diagnosis of lung cancer with concern for spinal mets.  Patient underwent MRI lumbar spine 12/17/2021 after complaining of low back pain that radiates to hip groin and leg bilaterally with leg weakness.  Imaging found constellation of findings highly suspicious for widespread osseous metastatic disease or leukemia/lymphoma.  Patient underwent CT CAP for further evaluation that found left lower lobe mass, diffuse lytic and sclerotic lesions in the bones with pathologic fracture at L2 suspicious for metastatic disease.  Patient is followed by oncology and has been referred to IR for tunneled catheter with port placement with moderate sedation.  Past Medical History:  Diagnosis Date   Complication of anesthesia    "Hard to sedate", per pt and slow to wake up   COVID 10/2020   Degenerative joint disease    s/p cervical and lumbar fusions   Depression    pt denies   Encounter for preventive health examination    Next mammogram in 07/2009. Next colonoscopy in 09/2017. DEXA 4/09>Normal   Encounter for screening colonoscopy 10/03/2007   Normal   Fatty liver    Fibromyalgia    GERD (gastroesophageal reflux disease)    Hemorrhoid    hx of   History of hiatal hernia    Hyperlipidemia    Hypertension    Sciatica     Past Surgical History:  Procedure Laterality Date   ABDOMINAL HYSTERECTOMY N/A    [facility] 11/21/2019   APPENDECTOMY N/A    [facility] 11/21/2019   BRONCHIAL NEEDLE ASPIRATION BIOPSY  01/06/2022   Procedure: BRONCHIAL NEEDLE ASPIRATION BIOPSIES;  Surgeon: Garner Nash, DO;  Location: [facility];  Service: Pulmonary;;   CERVICAL  FUSION  2010   X 2   LUMBAR FUSION  1997   X 2/has plate and screws and cage   OVARIAN CYST SURGERY  1980   pelvic prolapse  1987   RECTAL PROLAPSE REPAIR     SPINE SURGERY N/A    [facility] 11/21/2019   TONSILLECTOMY     and adenoids   TOTAL ABDOMINAL HYSTERECTOMY  1987   VIDEO BRONCHOSCOPY WITH ENDOBRONCHIAL ULTRASOUND Left 01/06/2022   Procedure: VIDEO BRONCHOSCOPY WITH ENDOBRONCHIAL ULTRASOUND;  Surgeon: Garner Nash, DO;  Location: [facility];  Service: Pulmonary;  Laterality: Left;    Allergies: Codeine, Morphine, Beef-derived products, Doxycycline, Ibuprofen, Iodinated contrast media, Latex, Nsaids, Penicillins, Pork-derived products, Tramadol, and Gabapentin  Medications: Prior to Admission medications   Medication Sig Start Date End Date Taking? Authorizing Provider  ALPRAZolam Duanne Moron) 0.5 MG tablet Take 1 tablet (0.5 mg total) by mouth at bedtime as needed for anxiety. 02/12/22   Ronnell Freshwater, NP  apixaban (ELIQUIS) 5 MG TABS tablet Take 1 tablet (5 mg total) by mouth 2 (two) times daily. 01/28/22   Hongalgi, Lenis Dickinson, MD  bisacodyl (DULCOLAX) 10 MG suppository Place 1 suppository (10 mg total) rectally daily as needed for severe constipation. 01/28/22   Hongalgi, Lenis Dickinson, MD  cefpodoxime (VANTIN) 100 MG tablet Take 1 tablet (100 mg total) by mouth 2 (two) times daily. Take for 7 days 02/26/22   Brunetta Genera, MD  celecoxib (CELEBREX) 200 MG capsule Take 1 capsule (200 mg total) by mouth daily.  02/28/22   Pickenpack-Cousar, Carlena Sax, NP  dexamethasone (DECADRON) 2 MG tablet Take 1 tablet (2 mg total) by mouth daily. 01/29/22   Hongalgi, Lenis Dickinson, MD  dexamethasone (DECADRON) 4 MG tablet Take 1 tab 2 times daily starting day before pemetrexed. Then take 2 tabs daily x 3 days starting day after carboplatin. Take with food. 02/15/22   Brunetta Genera, MD  diphenhydrAMINE (BENADRYL) 25 MG tablet Take 25 mg by mouth daily as needed for allergies.    [provider]   DULoxetine HCl 40 MG CPEP Take 40 mg by mouth daily. 02/28/22   Pickenpack-Cousar, Carlena Sax, NP  ezetimibe (ZETIA) 10 MG tablet TAKE 1 TABLET (10 MG TOTAL) BY MOUTH DAILY. 12/29/21   Lorrene Reid, PA-C  folic acid (FOLVITE) 1 MG tablet Take 1 tablet (1 mg total) by mouth daily. Start 7 days before pemetrexed chemotherapy. Continue until 21 days after pemetrexed completed. 02/15/22   Brunetta Genera, MD  hydrochlorothiazide (HYDRODIURIL) 12.5 MG tablet TAKE 1 TABLET (12.5 MG TOTAL) BY MOUTH DAILY. 01/07/22   Lorrene Reid, PA-C  lidocaine (SALONPAS PAIN RELIEVING) 4 % Place 1 patch onto the skin daily. 02/11/22   Ronnell Freshwater, NP  lidocaine-prilocaine (EMLA) cream Apply to affected area once 02/15/22   Brunetta Genera, MD  Methocarbamol 1000 MG TABS Take 1,000 mg by mouth 3 (three) times daily. 02/27/22   Pickenpack-Cousar, Carlena Sax, NP  Multiple Minerals-Vitamins (CAL MAG ZINC +D3 PO) Take 1 tablet by mouth 3 (three) times daily.    [provider]  Multiple Vitamin (MULTIVITAMIN) capsule Take 1 capsule by mouth daily.    [provider]  Nutritional Supplements (FEEDING SUPPLEMENT, KATE FARMS STANDARD 1.4,) LIQD liquid Take 325 mLs by mouth 2 (two) times daily between meals. 01/28/22   Hongalgi, Lenis Dickinson, MD  ondansetron (ZOFRAN) 8 MG tablet Take 1 tablet (8 mg total) by mouth every 8 (eight) hours as needed for nausea or vomiting. Start on the third day after carboplatin. 02/15/22   Brunetta Genera, MD  oxyCODONE ER [facility] ER) 27 MG C12A Take 2 capsules by mouth in the morning and at bedtime. 02/11/22   Pickenpack-Cousar, Carlena Sax, NP  Oxycodone HCl 20 MG TABS Take 1-2 tablets (20-40 mg total) by mouth every 4 (four) hours as needed. 03/02/22   Pickenpack-Cousar, Carlena Sax, NP  pantoprazole (PROTONIX) 20 MG tablet Take 1 tablet (20 mg total) by mouth 2 (two) times daily. 01/28/22   Hongalgi, Lenis Dickinson, MD  polyethylene glycol (MIRALAX / GLYCOLAX) 17 g packet Take 17 g by  mouth 2 (two) times daily. 01/28/22   Hongalgi, Lenis Dickinson, MD  pregabalin (LYRICA) 50 MG capsule TAKE 1 CAPSULE BY MOUTH 2 TIMES DAILY. 02/26/22   Pickenpack-Cousar, Carlena Sax, NP  prochlorperazine (COMPAZINE) 10 MG tablet Take 1 tablet (10 mg total) by mouth every 6 (six) hours as needed for nausea or vomiting. 02/15/22   Brunetta Genera, MD  senna-docusate (SENOKOT-S) 8.6-50 MG tablet Take 2 tablets by mouth 2 (two) times daily. 01/28/22   Hongalgi, Lenis Dickinson, MD  tamsulosin (FLOMAX) 0.4 MG CAPS capsule Take 1 capsule (0.4 mg total) by mouth daily after breakfast. 01/29/22   Hongalgi, Lenis Dickinson, MD  Vitamin D, Ergocalciferol, (DRISDOL) 1.25 MG (50000 UNIT) CAPS capsule Take 1 capsule (50,000 Units total) by mouth every 7 (seven) days. 02/03/22   Modena Jansky, MD     Family History  Problem Relation Age of Onset   Cancer  Mother    Diabetes Mother    Breast cancer Mother    Heart attack Mother    Hyperlipidemia Mother    Hyperlipidemia Father    Hypertension Father    Aneurysm Father    Dementia Maternal Grandfather    Cancer Paternal Grandmother        intestinal    Social History   Socioeconomic History   Marital status: Married    Spouse name: Not on file   Number of children: Not on file   Years of education: Not on file   Highest education level: Not on file  Occupational History   Not on file  Tobacco Use   Smoking status: Former    Packs/day: 2.00    Years: 30.00    Total pack years: 60.00    Types: Cigarettes    Quit date: 04/03/2003    Years since quitting: 18.9   Smokeless tobacco: Never  Vaping Use   Vaping Use: Never used  Substance and Sexual Activity   Alcohol use: Not Currently    Alcohol/week: 0.0 standard drinks of alcohol   Drug use: No   Sexual activity: Not Currently  Other Topics Concern   Not on file  Social History Narrative   Not on file   Social Determinants of Health   Financial Resource Strain: Medium Risk (03/02/2021)   Overall  Financial Resource Strain (CARDIA)    Difficulty of Paying Living Expenses: Somewhat hard  Food Insecurity: No Food Insecurity (03/02/2021)   Hunger Vital Sign    Worried About Running Out of Food in the Last Year: Never true    Friendship in the Last Year: Never true  Transportation Needs: No Transportation Needs (03/02/2021)   PRAPARE - Hydrologist (Medical): No    Lack of Transportation (Non-Medical): No  Physical Activity: Inactive (03/02/2021)   Exercise Vital Sign    Days of Exercise per Week: 0 days    Minutes of Exercise per Session: 0 min  Stress: Stress Concern Present (03/02/2021)   [place]    Feeling of Stress : To some extent  Social Connections: Moderately Integrated (03/02/2021)   Social Connection and Isolation Panel [NHANES]    Frequency of Communication with Friends and Family: More than three times a week    Frequency of Social Gatherings with Friends and Family: More than three times a week    Attends Religious Services: More than 4 times per year    Active Member of [employer] or Organizations: No    Attends Archivist Meetings: Never    Marital Status: Married    Review of Systems: A 12 point ROS discussed and pertinent positives are indicated in the HPI above.  All other systems are negative.  Review of Systems  Vital Signs: There were no vitals taken for this visit.   Physical Exam  Imaging: No results found.  Labs:  CBC: Recent Labs    01/24/22 0558 01/28/22 0649 02/04/22 1420 02/20/22 1118  WBC 6.6 4.1 4.0 6.0  HGB 13.2 12.0 11.6 10.9*  HCT 40.9 36.4 32.9* 32.7*  PLT 262 215 135* 335    COAGS: Recent Labs    01/08/22 1719  INR 1.0  APTT 28    BMP: Recent Labs    01/20/22 0805 01/24/22 0558 01/28/22 0649 02/04/22 1420 02/20/22 1118  NA 137 136 138 137 135  K 4.3 3.9 3.5 4.5 4.2  CL 102 98 104 99 103  CO2 27 32 27 24  24   GLUCOSE 126* 110* 127* 162* 175*  BUN 26* 22 17 10  25*  CALCIUM 9.2 9.0 7.2* 8.2* 8.7*  CREATININE 0.58 0.48 0.39* 0.35* 0.58  GFRNONAA >60 >60 >60  --  >60    LIVER FUNCTION TESTS: Recent Labs    07/07/21 1125 02/04/22 1420 02/20/22 1118  BILITOT 0.4 0.5 0.5  AST 34 28 30  ALT 21 41* 23  ALKPHOS 121 208* 190*  PROT 7.3 5.7* 6.7  ALBUMIN 4.7 3.4* 3.5    TUMOR MARKERS: No results for input(s): "AFPTM", "CEA", "CA199", "CHROMGRNA" in the last 8760 hours.  Assessment and Plan: 66 year old female with recent diagnosis of lung cancer presents for tunneled catheter with port placement at the request of Dr. Irene Limbo.  Risks and benefits of image guided tunneled catheter with port placement with moderate sedation was discussed with the patient including, but not limited to bleeding, infection, pneumothorax, or fibrin sheath development and need for additional procedures.  All of the patient's questions were answered, patient is agreeable to proceed. Consent signed and in chart.   Thank you for this interesting consult.  I greatly enjoyed meeting Misty Decker and look forward to participating in their care.  A copy of this report was sent to the requesting provider on this date.  Electronically Signed: Tyson Alias, NP 03/03/2022, 1:02 PM   I spent a total of {New SKAJ:[id number]} {New Out-Pt:[id number]}  {Established Out-Pt:[id number]} in face to face in clinical consultation, greater than 50% of which was counseling/coordinating care for metastatic lung cancer.

## 2022-03-04 ENCOUNTER — Encounter (HOSPITAL_COMMUNITY): Payer: Self-pay

## 2022-03-04 ENCOUNTER — Ambulatory Visit (HOSPITAL_COMMUNITY)
Admission: RE | Admit: 2022-03-04 | Discharge: 2022-03-04 | Disposition: A | Discharge status: Home / Self Care | Payer: Medicare Other | Source: Ambulatory Visit | Attending: Hematology | Admitting: Hematology

## 2022-03-04 ENCOUNTER — Other Ambulatory Visit: Payer: Self-pay | Admitting: Nurse Practitioner

## 2022-03-04 ENCOUNTER — Encounter: Payer: Self-pay | Admitting: Hematology

## 2022-03-04 DIAGNOSIS — C7951 Secondary malignant neoplasm of bone: Secondary | ICD-10-CM | POA: Diagnosis not present

## 2022-03-04 DIAGNOSIS — Z87891 Personal history of nicotine dependence: Secondary | ICD-10-CM | POA: Insufficient documentation

## 2022-03-04 DIAGNOSIS — Z515 Encounter for palliative care: Secondary | ICD-10-CM

## 2022-03-04 DIAGNOSIS — G893 Neoplasm related pain (acute) (chronic): Secondary | ICD-10-CM

## 2022-03-04 DIAGNOSIS — C3492 Malignant neoplasm of unspecified part of left bronchus or lung: Secondary | ICD-10-CM | POA: Diagnosis not present

## 2022-03-04 HISTORY — PX: IR IMAGING GUIDED PORT INSERTION: IMG5740

## 2022-03-04 MED ORDER — MIDAZOLAM HCL 2 MG/2ML IJ SOLN
INTRAMUSCULAR | Status: AC | PRN
  Administered 2022-03-04: .5 mg via INTRAVENOUS

## 2022-03-04 MED ORDER — LIDOCAINE-EPINEPHRINE 1 %-1:100000 IJ SOLN
INTRAMUSCULAR | Status: AC
  Filled 2022-03-04: qty 1

## 2022-03-04 MED ORDER — SODIUM CHLORIDE 0.9 % IV SOLN: INTRAVENOUS | Status: DC

## 2022-03-04 MED ORDER — MIDAZOLAM HCL 2 MG/2ML IJ SOLN
INTRAMUSCULAR | Status: AC | PRN
  Administered 2022-03-04: 1 mg via INTRAVENOUS

## 2022-03-04 MED ORDER — FENTANYL CITRATE (PF) 100 MCG/2ML IJ SOLN
INTRAMUSCULAR | Status: AC | PRN
  Administered 2022-03-04: 25 ug via INTRAVENOUS

## 2022-03-04 MED ORDER — LIDOCAINE-EPINEPHRINE 1 %-1:100000 IJ SOLN
INTRAMUSCULAR | Status: AC | PRN
  Administered 2022-03-04: 10 mL

## 2022-03-04 MED ORDER — HEPARIN SOD (PORK) LOCK FLUSH 100 UNIT/ML IV SOLN
INTRAVENOUS | Status: AC
  Filled 2022-03-04: qty 5

## 2022-03-04 MED ORDER — FENTANYL CITRATE (PF) 100 MCG/2ML IJ SOLN
INTRAMUSCULAR | Status: AC
  Filled 2022-03-04: qty 2

## 2022-03-04 MED ORDER — LIDOCAINE-EPINEPHRINE 1 %-1:100000 IJ SOLN
INTRAMUSCULAR | Status: AC | PRN
  Administered 2022-03-04: 10 mL via INTRADERMAL

## 2022-03-04 MED ORDER — MIDAZOLAM HCL 2 MG/2ML IJ SOLN
INTRAMUSCULAR | Status: AC
  Filled 2022-03-04: qty 4

## 2022-03-04 NOTE — Procedures (Signed)
Pre Procedure Dx: Poor venous access Post Procedural Dx: Same  Successful placement of right IJ approach port-a-cath with tip at the superior caval atrial junction. The catheter is ready for immediate use.  Estimated Blood Loss: Minimal  Complications: None immediate.  Jay Jalen Daluz, MD Pager #: [phone]   

## 2022-03-04 NOTE — Discharge Instructions (Signed)
Discharge Instructions:   Please call Interventional Radiology clinic [phone] with any questions or concerns.  You may remove your dressing and shower tomorrow.  Do not use EMLA / Lidocaine cream for 2 weeks post Port Insertion.  Implanted Port Insertion, Care After The following information offers guidance on how to care for yourself after your procedure. Your health care provider may also give you more specific instructions. If you have problems or questions, contact your health care provider. What can I expect after the procedure? After the procedure, it is common to have: Discomfort at the port insertion site. Bruising on the skin over the port. This should improve over 3-4 days. Follow these instructions at home: [facility] care After your port is placed, you will get a manufacturer's information card. The card has information about your port. Keep this card with you at all times. Take care of the port as told by your health care provider. Ask your health care provider if you or a family member can get training for taking care of the port at home. A home health care nurse will be be available to help care for the port. Make sure to remember what type of port you have. Incision care     Follow instructions from your health care provider about how to take care of your port insertion site. Make sure you: Wash your hands with soap and water for at least 20 seconds before and after you change your bandage (dressing). If soap and water are not available, use hand sanitizer. Change your dressing as told by your health care provider. Leave stitches (sutures), skin glue, or adhesive strips in place. These skin closures may need to stay in place for 2 weeks or longer. If adhesive strip edges start to loosen and curl up, you may trim the loose edges. Do not remove adhesive strips completely unless your health care provider tells you to do that. Check your port insertion site every day for signs  of infection. Check for: Redness, swelling, or pain. Fluid or blood. Warmth. Pus or a bad smell. Activity Return to your normal activities as told by your health care provider. Ask your health care provider what activities are safe for you. You may have to avoid lifting. Ask your health care provider how much you can safely lift. General instructions Take over-the-counter and prescription medicines only as told by your health care provider. Do not take baths, swim, or use a hot tub until your health care provider approves. Ask your health care provider if you may take showers. You may only be allowed to take sponge baths. If you were given a sedative during the procedure, it can affect you for several hours. Do not drive or operate machinery until your health care provider says that it is safe. Wear a medical alert bracelet in case of an emergency. This will tell any health care providers that you have a port. Keep all follow-up visits. This is important. Contact a health care provider if: You cannot flush your port with saline as directed, or you cannot draw blood from the port. You have a fever or chills. You have redness, swelling, or pain around your port insertion site. You have fluid or blood coming from your port insertion site. Your port insertion site feels warm to the touch. You have pus or a bad smell coming from the port insertion site. Get help right away if: You have chest pain or shortness of breath. You have bleeding from your port  that you cannot control. These symptoms may be an emergency. Get help right away. Call 911. Do not wait to see if the symptoms will go away. Do not drive yourself to the hospital. Summary Take care of the port as told by your health care provider. Keep the manufacturer's information card with you at all times. Change your dressing as told by your health care provider. Contact a health care provider if you have a fever or chills or if you have  redness, swelling, or pain around your port insertion site. Keep all follow-up visits. This information is not intended to replace advice given to you by your health care provider. Make sure you discuss any questions you have with your health care provider. Document Revised: 11/26/2020 Document Reviewed: 11/26/2020 Elsevier Patient Education  [place].    Moderate Conscious Sedation, Adult, Care After This sheet gives you information about how to care for yourself after your procedure. Your health care provider may also give you more specific instructions. If you have problems or questions, contact your health care provider. What can I expect after the procedure? After the procedure, it is common to have: Sleepiness for several hours. Impaired judgment for several hours. Difficulty with balance. Vomiting if you eat too soon. Follow these instructions at home: For the time period you were told by your health care provider:  Rest. Do not participate in activities where you could fall or become injured. Do not drive or use machinery. Do not drink alcohol. Do not take sleeping pills or medicines that cause drowsiness. Do not make important decisions or sign legal documents. Do not take care of children on your own. Eating and drinking  Follow the diet recommended by your health care provider. Drink enough fluid to keep your urine pale yellow. If you vomit: Drink water, juice, or soup when you can drink without vomiting. Make sure you have little or no nausea before eating solid foods. General instructions Take over-the-counter and prescription medicines only as told by your health care provider. Have a responsible adult stay with you for the time you are told. It is important to have someone help care for you until you are awake and alert. Do not smoke. Keep all follow-up visits as told by your health care provider. This is important. Contact a health care provider if: You  are still sleepy or having trouble with balance after 24 hours. You feel light-headed. You keep feeling nauseous or you keep vomiting. You develop a rash. You have a fever. You have redness or swelling around the IV site. Get help right away if: You have trouble breathing. You have new-onset confusion at home. Summary After the procedure, it is common to feel sleepy, have impaired judgment, or feel nauseous if you eat too soon. Rest after you get home. Know the things you should not do after the procedure. Follow the diet recommended by your health care provider and drink enough fluid to keep your urine pale yellow. Get help right away if you have trouble breathing or new-onset confusion at home. This information is not intended to replace advice given to you by your health care provider. Make sure you discuss any questions you have with your health care provider. Document Revised: 09/22/2019 Document Reviewed: 04/20/2019 Elsevier Patient Education  [place].

## 2022-03-05 ENCOUNTER — Other Ambulatory Visit (HOSPITAL_COMMUNITY): Payer: Self-pay

## 2022-03-05 ENCOUNTER — Other Ambulatory Visit: Payer: Self-pay | Admitting: Nurse Practitioner

## 2022-03-05 DIAGNOSIS — Z515 Encounter for palliative care: Secondary | ICD-10-CM

## 2022-03-05 DIAGNOSIS — C3492 Malignant neoplasm of unspecified part of left bronchus or lung: Secondary | ICD-10-CM

## 2022-03-05 DIAGNOSIS — G893 Neoplasm related pain (acute) (chronic): Secondary | ICD-10-CM

## 2022-03-05 MED ORDER — XTAMPZA ER 27 MG PO C12A
2.0000 | EXTENDED_RELEASE_CAPSULE | Freq: Two times a day (BID) | ORAL | 0 refills | Status: DC
  Filled 2022-03-05: qty 90, 23d supply, fill #0

## 2022-03-06 ENCOUNTER — Encounter: Payer: Self-pay | Admitting: Hematology

## 2022-03-06 ENCOUNTER — Other Ambulatory Visit (HOSPITAL_COMMUNITY): Payer: Self-pay

## 2022-03-06 DIAGNOSIS — C3492 Malignant neoplasm of unspecified part of left bronchus or lung: Secondary | ICD-10-CM

## 2022-03-06 DIAGNOSIS — Z515 Encounter for palliative care: Secondary | ICD-10-CM

## 2022-03-06 DIAGNOSIS — G893 Neoplasm related pain (acute) (chronic): Secondary | ICD-10-CM

## 2022-03-06 MED ORDER — OXYCODONE HCL 20 MG PO TABS
20.0000 mg | ORAL_TABLET | ORAL | 0 refills | Status: AC | PRN
  Filled 2022-03-07 – 2022-04-16 (×2): qty 90, 8d supply, fill #0

## 2022-03-07 ENCOUNTER — Other Ambulatory Visit (HOSPITAL_COMMUNITY): Payer: Self-pay

## 2022-03-09 ENCOUNTER — Other Ambulatory Visit (HOSPITAL_COMMUNITY): Payer: Self-pay

## 2022-03-09 ENCOUNTER — Other Ambulatory Visit: Payer: Self-pay | Admitting: Hematology

## 2022-03-09 DIAGNOSIS — Z7189 Other specified counseling: Secondary | ICD-10-CM

## 2022-03-09 DIAGNOSIS — C3492 Malignant neoplasm of unspecified part of left bronchus or lung: Secondary | ICD-10-CM

## 2022-03-10 ENCOUNTER — Encounter: Payer: Self-pay | Admitting: Nurse Practitioner

## 2022-03-10 ENCOUNTER — Other Ambulatory Visit: Payer: Self-pay

## 2022-03-10 ENCOUNTER — Inpatient Hospital Stay: Payer: Medicare Other | Attending: Hematology

## 2022-03-10 ENCOUNTER — Inpatient Hospital Stay (HOSPITAL_BASED_OUTPATIENT_CLINIC_OR_DEPARTMENT_OTHER): Payer: Medicare Other | Admitting: Hematology

## 2022-03-10 ENCOUNTER — Inpatient Hospital Stay (HOSPITAL_BASED_OUTPATIENT_CLINIC_OR_DEPARTMENT_OTHER): Payer: Medicare Other | Admitting: Nurse Practitioner

## 2022-03-10 DIAGNOSIS — Z5112 Encounter for antineoplastic immunotherapy: Secondary | ICD-10-CM | POA: Insufficient documentation

## 2022-03-10 DIAGNOSIS — I10 Essential (primary) hypertension: Secondary | ICD-10-CM | POA: Diagnosis not present

## 2022-03-10 DIAGNOSIS — F32A Depression, unspecified: Secondary | ICD-10-CM | POA: Insufficient documentation

## 2022-03-10 DIAGNOSIS — Z7189 Other specified counseling: Secondary | ICD-10-CM

## 2022-03-10 DIAGNOSIS — M797 Fibromyalgia: Secondary | ICD-10-CM | POA: Diagnosis not present

## 2022-03-10 DIAGNOSIS — Z7952 Long term (current) use of systemic steroids: Secondary | ICD-10-CM | POA: Diagnosis not present

## 2022-03-10 DIAGNOSIS — Z79899 Other long term (current) drug therapy: Secondary | ICD-10-CM | POA: Insufficient documentation

## 2022-03-10 DIAGNOSIS — C3432 Malignant neoplasm of lower lobe, left bronchus or lung: Secondary | ICD-10-CM | POA: Insufficient documentation

## 2022-03-10 DIAGNOSIS — Z515 Encounter for palliative care: Secondary | ICD-10-CM | POA: Diagnosis not present

## 2022-03-10 DIAGNOSIS — C3492 Malignant neoplasm of unspecified part of left bronchus or lung: Secondary | ICD-10-CM

## 2022-03-10 DIAGNOSIS — G893 Neoplasm related pain (acute) (chronic): Secondary | ICD-10-CM | POA: Diagnosis not present

## 2022-03-10 DIAGNOSIS — Z5111 Encounter for antineoplastic chemotherapy: Secondary | ICD-10-CM | POA: Insufficient documentation

## 2022-03-10 DIAGNOSIS — Z7901 Long term (current) use of anticoagulants: Secondary | ICD-10-CM | POA: Diagnosis not present

## 2022-03-10 DIAGNOSIS — E785 Hyperlipidemia, unspecified: Secondary | ICD-10-CM | POA: Insufficient documentation

## 2022-03-10 DIAGNOSIS — C7951 Secondary malignant neoplasm of bone: Secondary | ICD-10-CM | POA: Insufficient documentation

## 2022-03-10 DIAGNOSIS — R530 Neoplastic (malignant) related fatigue: Secondary | ICD-10-CM | POA: Diagnosis not present

## 2022-03-10 DIAGNOSIS — Z87891 Personal history of nicotine dependence: Secondary | ICD-10-CM | POA: Diagnosis not present

## 2022-03-10 LAB — CMP (CANCER CENTER ONLY)
ALT: 17 U/L (ref 0–44)
AST: 34 U/L (ref 15–41)
Albumin: 3.3 g/dL — ABNORMAL LOW (ref 3.5–5.0)
Alkaline Phosphatase: 226 U/L — ABNORMAL HIGH (ref 38–126)
Anion gap: 4 — ABNORMAL LOW (ref 5–15)
BUN: 10 mg/dL (ref 8–23)
CO2: 32 mmol/L (ref 22–32)
Calcium: 8.5 mg/dL — ABNORMAL LOW (ref 8.9–10.3)
Chloride: 99 mmol/L (ref 98–111)
Creatinine: 0.47 mg/dL (ref 0.44–1.00)
GFR, Estimated: >60 mL/min (ref >60)
Glucose, Bld: 127 mg/dL — ABNORMAL HIGH (ref 70–99)
Potassium: 3.9 mmol/L (ref 3.5–5.1)
Sodium: 135 mmol/L (ref 135–145)
Total Bilirubin: 0.5 mg/dL (ref 0.3–1.2)
Total Protein: 5.8 g/dL — ABNORMAL LOW (ref 6.5–8.1)

## 2022-03-10 LAB — CBC WITH DIFFERENTIAL (CANCER CENTER ONLY)
Abs Immature Granulocytes: 0.21 10*3/uL — ABNORMAL HIGH (ref 0.00–0.07)
Basophils Absolute: 0 10*3/uL (ref 0.0–0.1)
Basophils Relative: 1 %
Eosinophils Absolute: 0.1 10*3/uL (ref 0.0–0.5)
Eosinophils Relative: 1 %
HCT: 29.9 % — ABNORMAL LOW (ref 36.0–46.0)
Hemoglobin: 9.8 g/dL — ABNORMAL LOW (ref 12.0–15.0)
Immature Granulocytes: 6 %
Lymphocytes Relative: 23 %
Lymphs Abs: 0.9 10*3/uL (ref 0.7–4.0)
MCH: 32.9 pg (ref 26.0–34.0)
MCHC: 32.8 g/dL (ref 30.0–36.0)
MCV: 100.3 fL — ABNORMAL HIGH (ref 80.0–100.0)
Monocytes Absolute: 0.6 10*3/uL (ref 0.1–1.0)
Monocytes Relative: 17 %
Neutro Abs: 1.9 10*3/uL (ref 1.7–7.7)
Neutrophils Relative %: 52 %
Platelet Count: 445 10*3/uL — ABNORMAL HIGH (ref 150–400)
RBC: 2.98 MIL/uL — ABNORMAL LOW (ref 3.87–5.11)
RDW: 17 % — ABNORMAL HIGH (ref 11.5–15.5)
WBC Count: 3.7 10*3/uL — ABNORMAL LOW (ref 4.0–10.5)
nRBC: 0.8 % — ABNORMAL HIGH (ref 0.0–0.2)

## 2022-03-10 MED ORDER — SODIUM CHLORIDE 0.9% FLUSH
10.0000 mL | INTRAVENOUS | Status: DC | PRN
  Administered 2022-03-10: 10 mL via INTRAVENOUS

## 2022-03-10 MED ORDER — CEFPODOXIME PROXETIL 100 MG PO TABS: 100.0000 mg | ORAL_TABLET | Freq: Two times a day (BID) | ORAL | 0 refills | Status: AC

## 2022-03-10 NOTE — Progress Notes (Signed)
HEMATOLOGY/ONCOLOGY CLINIC NOTE  Date of Service: 03/10/2022  Patient Care Team: Lorrene Reid, PA-C as PCP - General Roseanne Kaufman, MD as Consulting Physician (Orthopedic Surgery) Druscilla Brownie, MD as Consulting Physician (Dermatology) Dalton-Bethea, Fabio Asa, MD (Physical Medicine and Rehabilitation) Glenna Fellows, MD as Attending Physician (Neurosurgery) Shela Leff, MD as Resident (Internal Medicine) Armbruster, Carlota Raspberry, MD as Consulting Physician (Gastroenterology) Pickenpack-Cousar, Carlena Sax, NP as Nurse Practitioner (Nurse Practitioner)  CHIEF COMPLAINTS/PURPOSE OF CONSULTATION:  Evaluation and management of stage IV metastatic non-small cell lung cancer, adenocarcinoma.  HISTORY OF PRESENTING ILLNESS:   Misty Decker is a wonderful 66 y.o. female who presented to the ED with significant pain in her left thigh and right hip. She presents in clinic today with her daughter  to follow up for evaluation and management of recently diagnosed stage IV metastatic non-small cell lung cancer, adenocarcinoma. She reports She is doing well with no new symptoms or concerns.  Her daughter reports that Misty Decker is tolerating treatment well with no prohibitive toxicities. She does mention some vomiting and grade 1 nausea while attempting to optimize her medications. She also notes her pain is well managed but does note some minor lower back pain. She reports some issues with cognition which is what prompted the interest in medication adjustment. We discussed her previous thyroid labs which indicated low T4 and TSH. We discussed getting them again today to trend them for further evaluation to which she is agreeable to.  She notes recent UTI. She took [place] for 7x days. We discussed continuing Vantin in the context of precaution while changing catheter and recent UTI.  Port placed successfully.  She notes she is eating well despite a weakened appetite. She does  note a change in taste.  No new change in bowel habits. No other new or acute focal symptoms.  Las done today were reviewed in detail.  MEDICAL HISTORY:  Past Medical History:  Diagnosis Date   Complication of anesthesia    "Hard to sedate", per pt and slow to wake up   COVID 10/2020   Degenerative joint disease    s/p cervical and lumbar fusions   Depression    pt denies   Encounter for preventive health examination    Next mammogram in 07/2009. Next colonoscopy in 09/2017. DEXA 4/09>Normal   Encounter for screening colonoscopy 10/03/2007   Normal   Fatty liver    Fibromyalgia    GERD (gastroesophageal reflux disease)    Hemorrhoid    hx of   History of hiatal hernia    Hyperlipidemia    Hypertension    Sciatica    Urinary tract infection 02/26/2022    SURGICAL HISTORY: Past Surgical History:  Procedure Laterality Date   ABDOMINAL HYSTERECTOMY N/A    [facility] 11/21/2019   APPENDECTOMY N/A    [facility] 11/21/2019   BRONCHIAL NEEDLE ASPIRATION BIOPSY  01/06/2022   Procedure: BRONCHIAL NEEDLE ASPIRATION BIOPSIES;  Surgeon: Garner Nash, DO;  Location: [facility];  Service: Pulmonary;;   CERVICAL FUSION  2010   X 2   IR IMAGING GUIDED PORT INSERTION  03/04/2022   LUMBAR FUSION  1997   X 2/has plate and screws and cage   OVARIAN CYST SURGERY  1980   pelvic prolapse  [facility] 11/21/2019   TONSILLECTOMY     and adenoids   TOTAL ABDOMINAL HYSTERECTOMY  1987  VIDEO BRONCHOSCOPY WITH ENDOBRONCHIAL ULTRASOUND Left 01/06/2022   Procedure: VIDEO BRONCHOSCOPY WITH ENDOBRONCHIAL ULTRASOUND;  Surgeon: Garner Nash, DO;  Location: [facility];  Service: Pulmonary;  Laterality: Left;    SOCIAL HISTORY: Social History   Socioeconomic History   Marital status: Married    Spouse name: Not on file   Number of children: Not on file   Years of education: Not on file   Highest education level: Not on file   Occupational History   Not on file  Tobacco Use   Smoking status: Former    Packs/day: 2.00    Years: 30.00    Total pack years: 60.00    Types: Cigarettes    Quit date: 04/03/2003    Years since quitting: 18.9   Smokeless tobacco: Never  Vaping Use   Vaping Use: Never used  Substance and Sexual Activity   Alcohol use: Not Currently    Alcohol/week: 0.0 standard drinks of alcohol   Drug use: No   Sexual activity: Not Currently  Other Topics Concern   Not on file  Social History Narrative   Not on file   Social Determinants of Health   Financial Resource Strain: Medium Risk (03/02/2021)   Overall Financial Resource Strain (CARDIA)    Difficulty of Paying Living Expenses: Somewhat hard  Food Insecurity: No Food Insecurity (03/02/2021)   Hunger Vital Sign    Worried About Running Out of Food in the Last Year: Never true    Ran Out of Food in the Last Year: Never true  Transportation Needs: No Transportation Needs (03/02/2021)   PRAPARE - Hydrologist (Medical): No    Lack of Transportation (Non-Medical): No  Physical Activity: Inactive (03/02/2021)   Exercise Vital Sign    Days of Exercise per Week: 0 days    Minutes of Exercise per Session: 0 min  Stress: Stress Concern Present (03/02/2021)   [place]    Feeling of Stress : To some extent  Social Connections: Moderately Integrated (03/02/2021)   Social Connection and Isolation Panel [NHANES]    Frequency of Communication with Friends and Family: More than three times a week    Frequency of Social Gatherings with Friends and Family: More than three times a week    Attends Religious Services: More than 4 times per year    Active Member of [employer] or Organizations: No    Attends Archivist Meetings: Never    Marital Status: Married  Human resources officer Violence: Not At Risk (03/02/2021)   Humiliation, Afraid, Rape, and Kick  questionnaire    Fear of Current or Ex-Partner: No    Emotionally Abused: No    Physically Abused: No    Sexually Abused: No    FAMILY HISTORY: Family History  Problem Relation Age of Onset   Cancer Mother    Diabetes Mother    Breast cancer Mother    Heart attack Mother    Hyperlipidemia Mother    Hyperlipidemia Father    Hypertension Father    Aneurysm Father    Dementia Maternal Grandfather    Cancer Paternal Grandmother        intestinal    ALLERGIES:  is allergic to codeine, morphine, beef-derived products, doxycycline, ibuprofen, iodinated contrast media, latex, nsaids, penicillins, pork-derived products, tramadol, and gabapentin.  MEDICATIONS:  Current Outpatient Medications  Medication Sig Dispense Refill   ALPRAZolam (XANAX) 0.5 MG tablet Take 1 tablet (0.5 mg  total) by mouth at bedtime as needed for anxiety. 30 tablet 1   apixaban (ELIQUIS) 5 MG TABS tablet Take 1 tablet (5 mg total) by mouth 2 (two) times daily. 60 tablet 1   bisacodyl (DULCOLAX) 10 MG suppository Place 1 suppository (10 mg total) rectally daily as needed for severe constipation. 12 suppository 0   cefpodoxime (VANTIN) 100 MG tablet Take 1 tablet (100 mg total) by mouth 2 (two) times daily. Take for 7 days 14 tablet 0   celecoxib (CELEBREX) 200 MG capsule Take 1 capsule (200 mg total) by mouth daily. 30 capsule 0   dexamethasone (DECADRON) 2 MG tablet Take 1 tablet (2 mg total) by mouth daily. 30 tablet 0   dexamethasone (DECADRON) 4 MG tablet Take 1 tab 2 times daily starting day before pemetrexed. Then take 2 tabs daily x 3 days starting day after carboplatin. Take with food. 30 tablet 1   diphenhydrAMINE (BENADRYL) 25 MG tablet Take 25 mg by mouth daily as needed for allergies.     DULoxetine HCl 40 MG CPEP Take 40 mg by mouth daily. 30 capsule 0   ezetimibe (ZETIA) 10 MG tablet TAKE 1 TABLET (10 MG TOTAL) BY MOUTH DAILY. 90 tablet 0   folic acid (FOLVITE) 1 MG tablet Take 1 tablet (1 mg total) by  mouth daily. Start 7 days before pemetrexed chemotherapy. Continue until 21 days after pemetrexed completed. 100 tablet 3   hydrochlorothiazide (HYDRODIURIL) 12.5 MG tablet TAKE 1 TABLET (12.5 MG TOTAL) BY MOUTH DAILY. 90 tablet 1   lidocaine (SALONPAS PAIN RELIEVING) 4 % Place 1 patch onto the skin daily. 30 patch 1   lidocaine-prilocaine (EMLA) cream Apply to affected area once 30 g 3   Methocarbamol 1000 MG TABS Take 1,000 mg by mouth 3 (three) times daily. 90 tablet 0   Multiple Minerals-Vitamins (CAL MAG ZINC +D3 PO) Take 1 tablet by mouth 3 (three) times daily.     Multiple Vitamin (MULTIVITAMIN) capsule Take 1 capsule by mouth daily.     Nutritional Supplements (FEEDING SUPPLEMENT, KATE FARMS STANDARD 1.4,) LIQD liquid Take 325 mLs by mouth 2 (two) times daily between meals.     ondansetron (ZOFRAN) 8 MG tablet Take 1 tablet (8 mg total) by mouth every 8 (eight) hours as needed for nausea or vomiting. Start on the third day after carboplatin. 30 tablet 1   oxyCODONE ER (XTAMPZA ER) 27 MG C12A Take 2 capsules by mouth in the morning and at bedtime. 90 capsule 0   Oxycodone HCl 20 MG TABS Take 1-2 tablets (20-40 mg total) by mouth every 4 (four) hours as needed. 90 tablet 0   pantoprazole (PROTONIX) 20 MG tablet Take 1 tablet (20 mg total) by mouth 2 (two) times daily. 60 tablet 1   polyethylene glycol (MIRALAX / GLYCOLAX) 17 g packet Take 17 g by mouth 2 (two) times daily. 30 each 1   pregabalin (LYRICA) 50 MG capsule TAKE 1 CAPSULE BY MOUTH 2 TIMES DAILY. 60 capsule 0   prochlorperazine (COMPAZINE) 10 MG tablet Take 1 tablet (10 mg total) by mouth every 6 (six) hours as needed for nausea or vomiting. 30 tablet 1   senna-docusate (SENOKOT-S) 8.6-50 MG tablet Take 2 tablets by mouth 2 (two) times daily. 120 tablet 1   tamsulosin (FLOMAX) 0.4 MG CAPS capsule Take 1 capsule (0.4 mg total) by mouth daily after breakfast. 30 capsule 1   Vitamin D, Ergocalciferol, (DRISDOL) 1.25 MG (50000 UNIT) CAPS  capsule Take 1  capsule (50,000 Units total) by mouth every 7 (seven) days. 5 capsule 0   No current facility-administered medications for this visit.    REVIEW OF SYSTEMS:   10 Point review of Systems was done is negative except as noted above.  PHYSICAL EXAMINATION: ECOG PERFORMANCE STATUS: 1 - Symptomatic but completely ambulatory  VS reviewed Vitals:   03/10/22 1238  BP: 110/64  Pulse: 75  Resp: 15  Temp: (!) 97.3 F (36.3 C)  SpO2: 92%   NAD GENERAL:alert, in no acute distress and comfortable SKIN: no acute rashes, no significant lesions EYES: conjunctiva are pink and non-injected, sclera anicteric NECK: supple, no JVD LYMPH:  no palpable lymphadenopathy in the cervical, axillary or inguinal regions LUNGS: clear to auscultation b/l with normal respiratory effort HEART: regular rate & rhythm ABDOMEN:  normoactive bowel sounds , non tender, not distended. Extremity: no pedal edema PSYCH: alert & oriented x 3 with fluent speech NEURO: no focal motor/sensory deficits  Exam performed in chair.  LABORATORY DATA:  I have reviewed the data as listed  .    Latest Ref Rng & Units 03/10/2022   12:19 PM 02/20/2022   11:18 AM 02/04/2022    2:20 PM  CBC  WBC 4.0 - 10.5 K/uL 3.7  6.0  4.0   Hemoglobin 12.0 - 15.0 g/dL 9.8  10.9  11.6   Hematocrit 36.0 - 46.0 % 29.9  32.7  32.9   Platelets 150 - 400 K/uL 445  335  135    .    Latest Ref Rng & Units 02/20/2022   11:18 AM 02/04/2022    2:20 PM 01/28/2022    6:49 AM  CMP  Glucose 70 - 99 mg/dL 175  162  127   BUN 8 - 23 mg/dL $Remove'25  10  17   'GfWwiBu$ Creatinine 0.44 - 1.00 mg/dL 0.58  0.35  0.39   Sodium 135 - 145 mmol/L 135  137  138   Potassium 3.5 - 5.1 mmol/L 4.2  4.5  3.5   Chloride 98 - 111 mmol/L 103  99  104   CO2 22 - 32 mmol/L $RemoveB'24  24  27   'XpesjqUT$ Calcium 8.9 - 10.3 mg/dL 8.7  8.2  7.2   Total Protein 6.5 - 8.1 g/dL 6.7  5.7    Total Bilirubin 0.3 - 1.2 mg/dL 0.5  0.5    Alkaline Phos 38 - 126 U/L 190  208    AST 15 - 41 U/L 30   28    ALT 0 - 44 U/L 23  41     GYN CYTOLOGY - NON PAP  CASE: [id number]  PATIENT: Devon Haig  Non-Gynecological Cytology Report      Clinical History: Lung mass, adenopathy     FINAL MICROSCOPIC DIAGNOSIS:   A. LYMPH NODE, STATION 7, FINE NEEDLE ASPIRATION:  - Malignant cells consistent with adenocarcinoma, see comment       COMMENT:   Immunohistochemical stains show that the tumor cells are negative for  TTF-1 and cytokeratin AE1/AE3 (membranous staining); while they are  negative for synaptophysin, CD56, p63 and CK5/6.  This immunoprofile is  consistent with above interpretation.  Dr. Arby Barrette reviewed the case  and concurs with the above diagnosis.  Dr. Valeta Harms was notified on  01/09/2022.        RADIOGRAPHIC STUDIES: I have personally reviewed the radiological images as listed and agreed with the findings in the report. IR IMAGING GUIDED PORT INSERTION  Result Date: 03/04/2022 INDICATION: History  of lung cancer. In need of durable intravenous access for chemotherapy administration. EXAM: IMPLANTED PORT A CATH PLACEMENT WITH ULTRASOUND AND FLUOROSCOPIC GUIDANCE COMPARISON:  PET-CT-01/05/2022 MEDICATIONS: None ANESTHESIA/SEDATION: Moderate (conscious) sedation was employed during this procedure as administered by the Interventional Radiology RN. A total of Versed 1.5 mg and Fentanyl 50 mcg was administered intravenously. Moderate Sedation Time: 25 minutes. The patient's level of consciousness and vital signs were monitored continuously by radiology nursing throughout the procedure under my direct supervision. CONTRAST:  None FLUOROSCOPY TIME:  54 seconds (3 mGy) COMPLICATIONS: None immediate. PROCEDURE: The procedure, risks, benefits, and alternatives were explained to the patient. Questions regarding the procedure were encouraged and answered. The patient understands and consents to the procedure. The right neck and chest were prepped with chlorhexidine in a sterile  fashion, and a sterile drape was applied covering the operative field. Maximum barrier sterile technique with sterile gowns and gloves were used for the procedure. A timeout was performed prior to the initiation of the procedure. Local anesthesia was provided with 1% lidocaine with epinephrine. After creating a small venotomy incision, a micropuncture kit was utilized to access the internal jugular vein. Real-time ultrasound guidance was utilized for vascular access including the acquisition of a permanent ultrasound image documenting patency of the accessed vessel. The microwire was utilized to measure appropriate catheter length. A subcutaneous port pocket was then created along the upper chest wall utilizing a combination of sharp and blunt dissection. The pocket was irrigated with sterile saline. A single lumen ISP sized power injectable port was chosen for placement. The 8 Fr catheter was tunneled from the port pocket site to the venotomy incision. The port was placed in the pocket. The external catheter was trimmed to appropriate length. At the venotomy, an 8 Fr peel-away sheath was placed over a guidewire under fluoroscopic guidance. The catheter was then placed through the sheath and the sheath was removed. Final catheter positioning was confirmed and documented with a fluoroscopic spot radiograph. The port was accessed with a Huber needle, aspirated and flushed with heparinized saline. The venotomy site was closed with an interrupted 4-0 Vicryl suture. The port pocket incision was closed with interrupted 2-0 Vicryl suture. Dermabond and Steri-strips were applied to both incisions. Dressings were applied. The patient tolerated the procedure well without immediate post procedural complication. FINDINGS: After catheter placement, the tip lies within the superior cavoatrial junction. The catheter aspirates and flushes normally and is ready for immediate use. IMPRESSION: Successful placement of a right internal  jugular approach power injectable Port-A-Cath. The catheter is ready for immediate use. Electronically Signed   By: Sandi Mariscal M.D.   On: 03/04/2022 16:06     ASSESSMENT & PLAN:   66 y.o. very pleasant female with  1. Stage IV metastatic non-small cell lung cancer, adenocarcinoma with extensive bone mets -12/26/2021 CT chest/abdomen/pelvis -left lower lobe mass measuring 5.3 x 4.1 x 4.5 cm, diffuse lytic and sclerotic lesions in the bones with possible pathologic fracture at L2 -01/05/2022 PET scan-left lower lobe lung mass intensely FDG avid, left upper lobe pulmonary nodule suspicious for pulmonary metastasis, FDG avid ipsilateral hilar, ipsilateral mediastinal, subcarinal, and contralateral right mediastinal nodal metastasis, widespread FDG avid lytic bone metastases with lytic lesion within the right proximal femur which may be of orthopedic significance. -01/08/2022 MRI brain with and without contrast-thrombus within the straight sinus, no evidence of intracranial metastatic disease.   2) posterior left iliac lesion/right distal inferior femoral neck, intertrochanteric, and lesser trochanteric lytic lesion at  risk for pathologic fracture -01/09/2022 CT femur right-diffuse lytic metastases seen throughout the pelvis and proximal right femur, dominant right distal inferior femoral neck, intertrochanteric, and lesser trochanteric lytic lesion-increase his susceptibility for risk of pathologic fracture -01/09/2022 CT femur left-numerous lytic metastases within the pelvis, dominant partially visualized posterior left iliac lesion measuring 2.5 x 5 cm, smaller superolateral left acetabular 1.9 cm lytic lesion which abuts the lateral cortex without definite cortical breakthrough   3) thrombus straight sinus -Seen by neurology who recommended full dose Lovenox x5 days followed by dabigatran 150 mg p.o. twice daily. -Patient continues to be on apixaban.  4) hypertension   5) hyperlipidemia   6)  fibromyalgia   7) h/o cervical and lumbar spinal fusion with chronic back pain issues with L2 compression fracture  Plan -I discussed available labs in detail with the patient and her daughter. CBC shows counts stable. WBC count is a little low at 3.7k, Hgb is 9.8, and platelets are 445k. -Patient's chart reviewed in detail -She has started Carbo/alimta/pembrolizumab and Zometa which she is tolerating well with no prohibitive toxicities. -continue current cancer pain mx -Continue to f/u with outpatient palliative care  -We discussed continuing Vantin in the context of precaution while changing catheter and recent UTI. Orders for Vantin signed. -Port placed successfully. -continuing Vantin in the context of precaution while changing catheter and recent UTI. -Will continue to trend TSH and T4 and evaluate further.  Follow up: F/u as per scheduled chemotherapy for C2 and C3  Zometa every 4 weeks starting this Friday.  .The total time spent in the appointment was 35 minutes* .  All of the patient's questions were answered with apparent satisfaction. The patient knows to call the clinic with any problems, questions or concerns.   Sullivan Lone MD MS AAHIVMS [facility] Hematology/Oncology Physician [facility]  .*Total Encounter Time as defined by the Centers for Medicare and Medicaid Services includes, in addition to the face-to-face time of a patient visit (documented in the note above) non-face-to-face time: obtaining and reviewing outside history, ordering and reviewing medications, tests or procedures, care coordination (communications with other health care professionals or caregivers) and documentation in the medical record.  I, Melene Muller, am acting as scribe for Dr. Sullivan Lone, MD.  .I have reviewed the above documentation for accuracy and completeness, and I agree with the above. Brunetta Genera MD

## 2022-03-10 NOTE — Progress Notes (Signed)
[place]  Telephone:([phone]) [phone] Fax:(336) [phone]   Name: Misty Decker Date: 03/10/2022 MRN: [id number]  DOB: [birth year]  Patient Care Team: Lorrene Reid, PA-C as PCP - General Roseanne Kaufman, MD as Consulting Physician (Orthopedic Surgery) Druscilla Brownie, MD as Consulting Physician (Dermatology) Dalton-Bethea, Fabio Asa, MD (Physical Medicine and Rehabilitation) Glenna Fellows, MD as Attending Physician (Neurosurgery) Shela Leff, MD as Resident (Internal Medicine) Armbruster, Carlota Raspberry, MD as Consulting Physician (Gastroenterology) Pickenpack-Cousar, Carlena Sax, NP as Nurse Practitioner (Nurse Practitioner)    INTERVAL HISTORY: Misty Decker is a 66 y.o. female with  medical history including recent diagnosis of lung cancer with concern for spinal mets, hypertension, fibromyalgia, depression, and hyperlipidemia.  Palliative ask to see for symptom management and goals of care.     SOCIAL HISTORY:     reports that she quit smoking about 18 years ago. Her smoking use included cigarettes. She has a 60.00 pack-year smoking history. She has never used smokeless tobacco. She reports that she does not currently use alcohol. She reports that she does not use drugs.  ADVANCE DIRECTIVES:    CODE STATUS:   PAST MEDICAL HISTORY: Past Medical History:  Diagnosis Date   Complication of anesthesia    "Hard to sedate", per pt and slow to wake up   COVID 10/2020   Degenerative joint disease    s/p cervical and lumbar fusions   Depression    pt denies   Encounter for preventive health examination    Next mammogram in 07/2009. Next colonoscopy in 09/2017. DEXA 4/09>Normal   Encounter for screening colonoscopy 10/03/2007   Normal   Fatty liver    Fibromyalgia    GERD (gastroesophageal reflux disease)    Hemorrhoid    hx of   History of hiatal hernia    Hyperlipidemia    Hypertension    Sciatica    Urinary tract infection  02/26/2022    ALLERGIES:  is allergic to codeine, morphine, beef-derived products, doxycycline, ibuprofen, iodinated contrast media, latex, nsaids, penicillins, pork-derived products, tramadol, and gabapentin.  MEDICATIONS:  Current Outpatient Medications  Medication Sig Dispense Refill   ALPRAZolam (XANAX) 0.5 MG tablet Take 1 tablet (0.5 mg total) by mouth at bedtime as needed for anxiety. 30 tablet 1   apixaban (ELIQUIS) 5 MG TABS tablet Take 1 tablet (5 mg total) by mouth 2 (two) times daily. 60 tablet 1   bisacodyl (DULCOLAX) 10 MG suppository Place 1 suppository (10 mg total) rectally daily as needed for severe constipation. 12 suppository 0   cefpodoxime (VANTIN) 100 MG tablet Take 1 tablet (100 mg total) by mouth 2 (two) times daily. Take for 7 days 14 tablet 0   celecoxib (CELEBREX) 200 MG capsule Take 1 capsule (200 mg total) by mouth daily. 30 capsule 0   dexamethasone (DECADRON) 2 MG tablet Take 1 tablet (2 mg total) by mouth daily. 30 tablet 0   dexamethasone (DECADRON) 4 MG tablet Take 1 tab 2 times daily starting day before pemetrexed. Then take 2 tabs daily x 3 days starting day after carboplatin. Take with food. 30 tablet 1   diphenhydrAMINE (BENADRYL) 25 MG tablet Take 25 mg by mouth daily as needed for allergies.     DULoxetine HCl 40 MG CPEP Take 40 mg by mouth daily. 30 capsule 0   ezetimibe (ZETIA) 10 MG tablet TAKE 1 TABLET (10 MG TOTAL) BY MOUTH DAILY. 90 tablet 0   folic acid (FOLVITE) 1 MG tablet  Take 1 tablet (1 mg total) by mouth daily. Start 7 days before pemetrexed chemotherapy. Continue until 21 days after pemetrexed completed. 100 tablet 3   hydrochlorothiazide (HYDRODIURIL) 12.5 MG tablet TAKE 1 TABLET (12.5 MG TOTAL) BY MOUTH DAILY. 90 tablet 1   lidocaine (SALONPAS PAIN RELIEVING) 4 % Place 1 patch onto the skin daily. 30 patch 1   lidocaine-prilocaine (EMLA) cream Apply to affected area once 30 g 3   Methocarbamol 1000 MG TABS Take 1,000 mg by mouth 3 (three)  times daily. 90 tablet 0   Multiple Minerals-Vitamins (CAL MAG ZINC +D3 PO) Take 1 tablet by mouth 3 (three) times daily.     Multiple Vitamin (MULTIVITAMIN) capsule Take 1 capsule by mouth daily.     Nutritional Supplements (FEEDING SUPPLEMENT, KATE FARMS STANDARD 1.4,) LIQD liquid Take 325 mLs by mouth 2 (two) times daily between meals.     ondansetron (ZOFRAN) 8 MG tablet Take 1 tablet (8 mg total) by mouth every 8 (eight) hours as needed for nausea or vomiting. Start on the third day after carboplatin. 30 tablet 1   oxyCODONE ER (XTAMPZA ER) 27 MG C12A Take 2 capsules by mouth in the morning and at bedtime. 90 capsule 0   Oxycodone HCl 20 MG TABS Take 1-2 tablets (20-40 mg total) by mouth every 4 (four) hours as needed. 90 tablet 0   pantoprazole (PROTONIX) 20 MG tablet Take 1 tablet (20 mg total) by mouth 2 (two) times daily. 60 tablet 1   polyethylene glycol (MIRALAX / GLYCOLAX) 17 g packet Take 17 g by mouth 2 (two) times daily. 30 each 1   pregabalin (LYRICA) 50 MG capsule TAKE 1 CAPSULE BY MOUTH 2 TIMES DAILY. 60 capsule 0   prochlorperazine (COMPAZINE) 10 MG tablet Take 1 tablet (10 mg total) by mouth every 6 (six) hours as needed for nausea or vomiting. 30 tablet 1   senna-docusate (SENOKOT-S) 8.6-50 MG tablet Take 2 tablets by mouth 2 (two) times daily. 120 tablet 1   tamsulosin (FLOMAX) 0.4 MG CAPS capsule Take 1 capsule (0.4 mg total) by mouth daily after breakfast. 30 capsule 1   Vitamin D, Ergocalciferol, (DRISDOL) 1.25 MG (50000 UNIT) CAPS capsule Take 1 capsule (50,000 Units total) by mouth every 7 (seven) days. 5 capsule 0   No current facility-administered medications for this visit.    VITAL SIGNS: There were no vitals taken for this visit. There were no vitals filed for this visit.  Estimated body mass index is 29.09 kg/m as calculated from the following:   Height as of 03/04/22: 5\' 3"  (1.6 m).   Weight as of an earlier encounter on 03/10/22: 164 lb 3.2 oz (74.5  kg).   PERFORMANCE STATUS (ECOG) : 2 - Symptomatic, <50% confined to bed   Physical Exam General: NAD, wheelchair  Cardiovascular: regular rate and rhythm Pulmonary: clear ant fields Extremities: no edema, no joint deformities Skin: no rashes Neurological: alert and oriented   IMPRESSION: Misty Decker presents to clinic today for symptom management follow-up. Her daughter, Estill Bamberg is present. Patient is awake and alert. Able to engage some in discussions.   Neoplasm related pain  Misty Decker and her daughter reports her pain has significantly improved. Unfortunately she was not able to obtain her pain medication for several days last week due to shortage. This did however allow for family to realize patient did not have to take as often.   I discussed current regimen at length with daughter. We will plan to  decrease use of Oxycodone IR to 20mg  every 8 hours as needed for pain. Xtampza dose decreased by half to 27 mg twice daily. We will plan to closely monitor. Continue lyrica and robaxin as prescribed. We discussed plans to continue weaning down pain medications if no increase in pain. I shared with Misty Decker and family the goal is to be on as least possible pain medications while allowing for good quality of life and functionality.   Constipation  She is taking Miralax twice daily. She had several days after treatment when she was constipated however since has had regular activity.   PLAN: Lyrica 50 mg twice daily Xtampza 27 mg twice daily (decreased from 54 mg) Oxycodone 20 mg every 8 hours as needed (decreased from 20-40mg ) Robaxin 1000mg  as needed Celebrex 200 mg daily  Senna daily Miralax daily  We will continue to closely monitor and adjust. If pain continues to be well managed on revised regimen will plan to continue weaning.  I will plan to see patient back in 4-6 weeks.    Patient expressed understanding and was in agreement with this plan. She also understands that She can  call the clinic at any time with any questions, concerns, or complaints.   Any controlled substances utilized were prescribed in the context of palliative care. PDMP has been reviewed.   Time Total: 45 min   Visit consisted of counseling and education dealing with the complex and emotionally intense issues of symptom management and palliative care in the setting of serious and potentially life-threatening illness.Greater than 50%  of this time was spent counseling and coordinating care related to the above assessment and plan.  Alda Lea, AGPCNP-BC  Palliative Medicine Team/[facility]

## 2022-03-11 ENCOUNTER — Encounter: Payer: Self-pay | Admitting: Urology

## 2022-03-11 NOTE — Progress Notes (Signed)
  Radiation Oncology         [phone]) [phone] ________________________________  Name: Misty Decker MRN: [id number]  Date: 03/12/2022  DOB: 12-01-1955  End of Treatment Note  Diagnosis:   66 year old female with poorly controlled back and left leg/hip pain associated with newly diagnosed widespread metastatic disease secondary to Stage IV NSCLC, adenocarcinoma.      Indication for treatment:  Palliation of pain       Radiation treatment dates:   01/12/22 - 01/23/22  Site/dose:   The painful sites of metastatic disease at T11, L2 and bilateral hips were treated to 30 Gy in 10 fractions of 3 Gy each.  Beams/energy:   A 3D field set-up was employed with 6 MV X-rays  Narrative: The patient tolerated radiation treatment relatively well and did note improvement in her pain towards the end of treatment.  Plan: The patient has completed radiation treatment. The patient will return to radiation oncology clinic for routine followup in one month. I advised her to call or return sooner if she has any questions or concerns related to her recovery or treatment. ________________________________  Sheral Apley. Tammi Klippel, M.D.

## 2022-03-11 NOTE — Progress Notes (Signed)
Radiation Oncology         [phone]) [phone] ________________________________  Name: Misty Decker MRN: [id number]  Date: 03/12/2022  DOB: 1956-05-29  Post Treatment Note  CC: Lorrene Reid, PA-C  Judith Part, MD  Diagnosis:   66 year old female with poorly controlled back and left leg/hip pain associated with newly diagnosed widespread metastatic disease secondary to Stage IV NSCLC, adenocarcinoma.      Interval Since Last Radiation:  7 weeks  01/12/22 - 01/23/22:   The painful sites of metastatic disease at T11, L2 and bilateral hips were treated to 30 Gy in 10 fractions of 3 Gy each.  Narrative:  I spoke with the patient to conduct her routine scheduled 53month follow up visit via telephone to spare the patient unnecessary potential exposure in the healthcare setting during the current COVID-19 pandemic.  The patient was notified in advance and gave permission to proceed with this visit format.  She tolerated radiation treatment relatively well and did note improvement in her pain towards the end of treatment.                               On review of systems, the patient states that she is doing well in general. Her pain is significantly improved after completing radiation, which she is thrilled with. She is currently without complaints and feels that she is tolerating the systemic therapy with Keytruda/Alimta/Carboplatin well. She is scheduled for cycle 2 on 03/13/22.  ALLERGIES:  is allergic to codeine, morphine, beef-derived products, doxycycline, ibuprofen, iodinated contrast media, latex, nsaids, penicillins, pork-derived products, tramadol, and gabapentin.  Meds: Current Outpatient Medications  Medication Sig Dispense Refill   ALPRAZolam (XANAX) 0.5 MG tablet Take 1 tablet (0.5 mg total) by mouth at bedtime as needed for anxiety. 30 tablet 1   apixaban (ELIQUIS) 5 MG TABS tablet Take 1 tablet (5 mg total) by mouth 2 (two) times daily. 60 tablet 1   bisacodyl (DULCOLAX) 10  MG suppository Place 1 suppository (10 mg total) rectally daily as needed for severe constipation. 12 suppository 0   cefpodoxime (VANTIN) 100 MG tablet Take 1 tablet (100 mg total) by mouth 2 (two) times daily. Take for 7 days 14 tablet 0   celecoxib (CELEBREX) 200 MG capsule Take 1 capsule (200 mg total) by mouth daily. 30 capsule 0   dexamethasone (DECADRON) 2 MG tablet Take 1 tablet (2 mg total) by mouth daily. 30 tablet 0   dexamethasone (DECADRON) 4 MG tablet Take 1 tab 2 times daily starting day before pemetrexed. Then take 2 tabs daily x 3 days starting day after carboplatin. Take with food. 30 tablet 1   diphenhydrAMINE (BENADRYL) 25 MG tablet Take 25 mg by mouth daily as needed for allergies.     DULoxetine HCl 40 MG CPEP Take 40 mg by mouth daily. 30 capsule 0   ezetimibe (ZETIA) 10 MG tablet TAKE 1 TABLET (10 MG TOTAL) BY MOUTH DAILY. 90 tablet 0   folic acid (FOLVITE) 1 MG tablet Take 1 tablet (1 mg total) by mouth daily. Start 7 days before pemetrexed chemotherapy. Continue until 21 days after pemetrexed completed. 100 tablet 3   hydrochlorothiazide (HYDRODIURIL) 12.5 MG tablet TAKE 1 TABLET (12.5 MG TOTAL) BY MOUTH DAILY. 90 tablet 1   lidocaine (SALONPAS PAIN RELIEVING) 4 % Place 1 patch onto the skin daily. 30 patch 1   lidocaine-prilocaine (EMLA) cream Apply to affected area once 30 g  3   Methocarbamol 1000 MG TABS Take 1,000 mg by mouth 3 (three) times daily. 90 tablet 0   Multiple Minerals-Vitamins (CAL MAG ZINC +D3 PO) Take 1 tablet by mouth 3 (three) times daily.     Multiple Vitamin (MULTIVITAMIN) capsule Take 1 capsule by mouth daily.     Nutritional Supplements (FEEDING SUPPLEMENT, KATE FARMS STANDARD 1.4,) LIQD liquid Take 325 mLs by mouth 2 (two) times daily between meals.     ondansetron (ZOFRAN) 8 MG tablet Take 1 tablet (8 mg total) by mouth every 8 (eight) hours as needed for nausea or vomiting. Start on the third day after carboplatin. 30 tablet 1   oxyCODONE ER  (XTAMPZA ER) 27 MG C12A Take 2 capsules by mouth in the morning and at bedtime. 90 capsule 0   Oxycodone HCl 20 MG TABS Take 1-2 tablets (20-40 mg total) by mouth every 4 (four) hours as needed. 90 tablet 0   pantoprazole (PROTONIX) 20 MG tablet Take 1 tablet (20 mg total) by mouth 2 (two) times daily. 60 tablet 1   polyethylene glycol (MIRALAX / GLYCOLAX) 17 g packet Take 17 g by mouth 2 (two) times daily. 30 each 1   pregabalin (LYRICA) 50 MG capsule TAKE 1 CAPSULE BY MOUTH 2 TIMES DAILY. 60 capsule 0   prochlorperazine (COMPAZINE) 10 MG tablet Take 1 tablet (10 mg total) by mouth every 6 (six) hours as needed for nausea or vomiting. 30 tablet 1   senna-docusate (SENOKOT-S) 8.6-50 MG tablet Take 2 tablets by mouth 2 (two) times daily. 120 tablet 1   tamsulosin (FLOMAX) 0.4 MG CAPS capsule Take 1 capsule (0.4 mg total) by mouth daily after breakfast. 30 capsule 1   Vitamin D, Ergocalciferol, (DRISDOL) 1.25 MG (50000 UNIT) CAPS capsule Take 1 capsule (50,000 Units total) by mouth every 7 (seven) days. 5 capsule 0   No current facility-administered medications for this visit.    Physical Findings:  vitals were not taken for this visit.   /10 Unable to assess due to telephone follow up visit format.  Lab Findings: Lab Results  Component Value Date   WBC 3.7 (L) 03/10/2022   HGB 9.8 (L) 03/10/2022   HCT 29.9 (L) 03/10/2022   MCV 100.3 (H) 03/10/2022   PLT 445 (H) 03/10/2022     Radiographic Findings: IR IMAGING GUIDED PORT INSERTION  Result Date: 03/04/2022 INDICATION: History of lung cancer. In need of durable intravenous access for chemotherapy administration. EXAM: IMPLANTED PORT A CATH PLACEMENT WITH ULTRASOUND AND FLUOROSCOPIC GUIDANCE COMPARISON:  PET-CT-01/05/2022 MEDICATIONS: None ANESTHESIA/SEDATION: Moderate (conscious) sedation was employed during this procedure as administered by the Interventional Radiology RN. A total of Versed 1.5 mg and Fentanyl 50 mcg was administered  intravenously. Moderate Sedation Time: 25 minutes. The patient's level of consciousness and vital signs were monitored continuously by radiology nursing throughout the procedure under my direct supervision. CONTRAST:  None FLUOROSCOPY TIME:  54 seconds (3 mGy) COMPLICATIONS: None immediate. PROCEDURE: The procedure, risks, benefits, and alternatives were explained to the patient. Questions regarding the procedure were encouraged and answered. The patient understands and consents to the procedure. The right neck and chest were prepped with chlorhexidine in a sterile fashion, and a sterile drape was applied covering the operative field. Maximum barrier sterile technique with sterile gowns and gloves were used for the procedure. A timeout was performed prior to the initiation of the procedure. Local anesthesia was provided with 1% lidocaine with epinephrine. After creating a small venotomy incision, a micropuncture  kit was utilized to access the internal jugular vein. Real-time ultrasound guidance was utilized for vascular access including the acquisition of a permanent ultrasound image documenting patency of the accessed vessel. The microwire was utilized to measure appropriate catheter length. A subcutaneous port pocket was then created along the upper chest wall utilizing a combination of sharp and blunt dissection. The pocket was irrigated with sterile saline. A single lumen ISP sized power injectable port was chosen for placement. The 8 Fr catheter was tunneled from the port pocket site to the venotomy incision. The port was placed in the pocket. The external catheter was trimmed to appropriate length. At the venotomy, an 8 Fr peel-away sheath was placed over a guidewire under fluoroscopic guidance. The catheter was then placed through the sheath and the sheath was removed. Final catheter positioning was confirmed and documented with a fluoroscopic spot radiograph. The port was accessed with a Huber needle,  aspirated and flushed with heparinized saline. The venotomy site was closed with an interrupted 4-0 Vicryl suture. The port pocket incision was closed with interrupted 2-0 Vicryl suture. Dermabond and Steri-strips were applied to both incisions. Dressings were applied. The patient tolerated the procedure well without immediate post procedural complication. FINDINGS: After catheter placement, the tip lies within the superior cavoatrial junction. The catheter aspirates and flushes normally and is ready for immediate use. IMPRESSION: Successful placement of a right internal jugular approach power injectable Port-A-Cath. The catheter is ready for immediate use. Electronically Signed   By: Sandi Mariscal M.D.   On: 03/04/2022 16:06    Impression/Plan: 66 year old female with poorly controlled back and left leg/hip pain associated with newly diagnosed widespread metastatic disease secondary to Stage IV NSCLC, adenocarcinoma.   She appears to have recovered well from the effects of her recent palliative radiation and has noticed improvement in her back and hip pain.  She is currently without complaints.  She is scheduled for cycle 2 of her systemic therapy with Keytruda/Alimta/carboplatin on 03/13/2022.  We discussed that while we are happy to continue to participate in her care if clinically indicated, at this point, we will plan to see her back on an as-needed basis.  She will continue in routine follow-up under the care of direction of Dr. Irene Limbo for continued management of her systemic disease.  She knows that she is welcome to call at anytime with any questions or concerns related to her previous radiation.   Nicholos Johns, PA-C

## 2022-03-11 NOTE — Progress Notes (Signed)
Telephone appointment. I spoke w/ patient's daughter Ms. Estill Bamberg May, verified her identity and began nursing interview. She reports her patient's pain is 2/10 and typically located in the RT foot/ leg and lower back (hips). No other issues reported at this time.  Meaningful use complete.  Patient is aware of her 2:00pm-03/12/22 telephone appointment w/ Ashlyn Bruning PA-C. I left my extension [phone] in case patient needs anything. Patient's daughter verbalized understanding.  Patient contact [phone]

## 2022-03-12 ENCOUNTER — Inpatient Hospital Stay: Payer: Medicare Other

## 2022-03-12 ENCOUNTER — Ambulatory Visit
Admission: RE | Admit: 2022-03-12 | Discharge: 2022-03-12 | Disposition: A | Discharge status: Home / Self Care | Payer: Medicare Other | Source: Ambulatory Visit | Attending: Urology | Admitting: Urology

## 2022-03-12 DIAGNOSIS — C7951 Secondary malignant neoplasm of bone: Secondary | ICD-10-CM

## 2022-03-12 MED FILL — Fosaprepitant Dimeglumine For IV Infusion 150 MG (Base Eq): INTRAVENOUS | Qty: 5 | Status: AC

## 2022-03-12 MED FILL — Dexamethasone Sodium Phosphate Inj 100 MG/10ML: INTRAMUSCULAR | Qty: 1 | Status: AC

## 2022-03-13 ENCOUNTER — Other Ambulatory Visit: Payer: Self-pay

## 2022-03-13 ENCOUNTER — Ambulatory Visit: Payer: Medicare Other

## 2022-03-13 ENCOUNTER — Inpatient Hospital Stay: Payer: Medicare Other

## 2022-03-13 VITALS — BP 118/56 | HR 85 | Temp 98.1°F | Resp 16

## 2022-03-13 DIAGNOSIS — Z5112 Encounter for antineoplastic immunotherapy: Secondary | ICD-10-CM | POA: Diagnosis not present

## 2022-03-13 DIAGNOSIS — C3492 Malignant neoplasm of unspecified part of left bronchus or lung: Secondary | ICD-10-CM

## 2022-03-13 DIAGNOSIS — Z7189 Other specified counseling: Secondary | ICD-10-CM

## 2022-03-13 DIAGNOSIS — C419 Malignant neoplasm of bone and articular cartilage, unspecified: Secondary | ICD-10-CM

## 2022-03-13 MED ORDER — SODIUM CHLORIDE 0.9 % IV SOLN
500.0000 mg/m2 | Freq: Once | INTRAVENOUS | Status: AC
  Administered 2022-03-13: 1000 mg via INTRAVENOUS
  Filled 2022-03-13: qty 40

## 2022-03-13 MED ORDER — SODIUM CHLORIDE 0.9% FLUSH
10.0000 mL | INTRAVENOUS | Status: DC | PRN
  Administered 2022-03-13: 10 mL

## 2022-03-13 MED ORDER — SODIUM CHLORIDE 0.9 % IV SOLN
450.0000 mg | Freq: Once | INTRAVENOUS | Status: AC
  Administered 2022-03-13: 450 mg via INTRAVENOUS
  Filled 2022-03-13: qty 45

## 2022-03-13 MED ORDER — ZOLEDRONIC ACID 4 MG/100ML IV SOLN
4.0000 mg | Freq: Once | INTRAVENOUS | Status: AC
  Administered 2022-03-13: 4 mg via INTRAVENOUS
  Filled 2022-03-13: qty 100

## 2022-03-13 MED ORDER — PALONOSETRON HCL INJECTION 0.25 MG/5ML
0.2500 mg | Freq: Once | INTRAVENOUS | Status: AC
  Administered 2022-03-13: 0.25 mg via INTRAVENOUS
  Filled 2022-03-13: qty 5

## 2022-03-13 MED ORDER — SODIUM CHLORIDE 0.9 % IV SOLN
200.0000 mg | Freq: Once | INTRAVENOUS | Status: AC
  Administered 2022-03-13: 200 mg via INTRAVENOUS
  Filled 2022-03-13: qty 200

## 2022-03-13 MED ORDER — SODIUM CHLORIDE 0.9 % IV SOLN
10.0000 mg | Freq: Once | INTRAVENOUS | Status: AC
  Administered 2022-03-13: 10 mg via INTRAVENOUS
  Filled 2022-03-13: qty 10

## 2022-03-13 MED ORDER — SODIUM CHLORIDE 0.9 % IV SOLN
150.0000 mg | Freq: Once | INTRAVENOUS | Status: AC
  Administered 2022-03-13: 150 mg via INTRAVENOUS
  Filled 2022-03-13: qty 150

## 2022-03-13 MED ORDER — SODIUM CHLORIDE 0.9 % IV SOLN: Freq: Once | INTRAVENOUS | Status: AC

## 2022-03-13 NOTE — Patient Instructions (Signed)
[facility]   Discharge Instructions: Thank you for choosing [facility] to provide your oncology and hematology care.   If you have a lab appointment with the [facility], please go directly to the [facility] and check in at the registration area.   Wear comfortable clothing and clothing appropriate for easy access to any Portacath or PICC line.   We strive to give you quality time with your provider. You may need to reschedule your appointment if you arrive late (15 or more minutes).  Arriving late affects you and other patients whose appointments are after yours.  Also, if you miss three or more appointments without notifying the office, you may be dismissed from the clinic at the provider's discretion.      For prescription refill requests, have your pharmacy contact our office and allow 72 hours for refills to be completed.    Today you received the following chemotherapy and/or immunotherapy agents: pembrolizumab, pemetrexed, carboplatin      To help prevent nausea and vomiting after your treatment, we encourage you to take your nausea medication as directed.  BELOW ARE SYMPTOMS THAT SHOULD BE REPORTED IMMEDIATELY: *FEVER GREATER THAN 100.4 F (38 C) OR HIGHER *CHILLS OR SWEATING *NAUSEA AND VOMITING THAT IS NOT CONTROLLED WITH YOUR NAUSEA MEDICATION *UNUSUAL SHORTNESS OF BREATH *UNUSUAL BRUISING OR BLEEDING *URINARY PROBLEMS (pain or burning when urinating, or frequent urination) *BOWEL PROBLEMS (unusual diarrhea, constipation, pain near the anus) TENDERNESS IN MOUTH AND THROAT WITH OR WITHOUT PRESENCE OF ULCERS (sore throat, sores in mouth, or a toothache) UNUSUAL RASH, SWELLING OR PAIN  UNUSUAL VAGINAL DISCHARGE OR ITCHING   Items with * indicate a potential emergency and should be followed up as soon as possible or go to the Emergency Department if any problems should occur.  Please show the CHEMOTHERAPY ALERT CARD or  IMMUNOTHERAPY ALERT CARD at check-in to the Emergency Department and triage nurse.  Should you have questions after your visit or need to cancel or reschedule your appointment, please contact [facility]  Dept: [phone]  and follow the prompts.  Office hours are 8:00 a.m. to 4:30 p.m. Monday - Friday. Please note that voicemails left after 4:00 p.m. may not be returned until the following business day.  We are closed weekends and major holidays. You have access to a nurse at all times for urgent questions. Please call the main number to the clinic Dept: [phone] and follow the prompts.   For any non-urgent questions, you may also contact your provider using MyChart. We now offer e-Visits for anyone 3 and older to request care online for non-urgent symptoms. For details visit [email or web address].   Also download the MyChart app! Go to the app store, search "MyChart", open the app, select [facility], and log in with your MyChart username and password.  Masks are optional in the cancer centers. If you would like for your care team to wear a mask while they are taking care of you, please let them know. You may have one support person who is at least 66 years old accompany you for your appointments.

## 2022-03-16 ENCOUNTER — Encounter: Payer: Self-pay | Admitting: Hematology

## 2022-03-26 ENCOUNTER — Other Ambulatory Visit: Payer: Self-pay | Admitting: Nurse Practitioner

## 2022-03-26 DIAGNOSIS — C3492 Malignant neoplasm of unspecified part of left bronchus or lung: Secondary | ICD-10-CM

## 2022-03-26 DIAGNOSIS — Z515 Encounter for palliative care: Secondary | ICD-10-CM

## 2022-03-26 DIAGNOSIS — G893 Neoplasm related pain (acute) (chronic): Secondary | ICD-10-CM

## 2022-03-26 MED ORDER — PREGABALIN 50 MG PO CAPS: 50.0000 mg | ORAL_CAPSULE | Freq: Two times a day (BID) | ORAL | 0 refills | Status: DC

## 2022-03-26 MED ORDER — METHOCARBAMOL 1000 MG PO TABS: 1000.0000 mg | ORAL_TABLET | Freq: Three times a day (TID) | ORAL | 0 refills | Status: DC

## 2022-03-26 MED ORDER — CELECOXIB 200 MG PO CAPS: 200.0000 mg | ORAL_CAPSULE | Freq: Every day | ORAL | 0 refills | Status: DC

## 2022-03-27 ENCOUNTER — Other Ambulatory Visit: Payer: Self-pay | Admitting: Physician Assistant

## 2022-03-27 DIAGNOSIS — E782 Mixed hyperlipidemia: Secondary | ICD-10-CM

## 2022-03-30 ENCOUNTER — Other Ambulatory Visit: Payer: Self-pay

## 2022-03-30 ENCOUNTER — Encounter: Payer: Self-pay | Admitting: Hematology

## 2022-03-31 ENCOUNTER — Encounter: Payer: Self-pay | Admitting: Physician Assistant

## 2022-03-31 ENCOUNTER — Encounter: Payer: Self-pay | Admitting: Hematology

## 2022-04-01 ENCOUNTER — Encounter: Payer: Self-pay | Admitting: Pulmonary Disease

## 2022-04-01 ENCOUNTER — Other Ambulatory Visit: Payer: Self-pay

## 2022-04-01 DIAGNOSIS — C3492 Malignant neoplasm of unspecified part of left bronchus or lung: Secondary | ICD-10-CM

## 2022-04-01 MED ORDER — APIXABAN 5 MG PO TABS: 5.0000 mg | ORAL_TABLET | Freq: Two times a day (BID) | ORAL | 11 refills | Status: AC

## 2022-04-01 MED ORDER — SENNOSIDES-DOCUSATE SODIUM 8.6-50 MG PO TABS: 2.0000 | ORAL_TABLET | Freq: Two times a day (BID) | ORAL | 1 refills | Status: AC

## 2022-04-01 MED ORDER — PANTOPRAZOLE SODIUM 20 MG PO TBEC: 20.0000 mg | DELAYED_RELEASE_TABLET | Freq: Two times a day (BID) | ORAL | 1 refills | Status: AC

## 2022-04-01 MED ORDER — APIXABAN 5 MG PO TABS: 5.0000 mg | ORAL_TABLET | Freq: Two times a day (BID) | ORAL | 1 refills | Status: AC

## 2022-04-01 NOTE — Telephone Encounter (Signed)
Dr Valeta Harms, please advise on who should be refilling her Eliquis, thanks!  I was put on Eliquis while at [facility] and I have run out of refills, and I am not getting a clear answer on who refills this medication.  Can you refill this medication or tell me who should be responsible for filling this prescription? Thanks. Messaging back is fine. Thanks.

## 2022-04-01 NOTE — Telephone Encounter (Signed)
Garner Nash, DO to Me  [facility] Triage Pool      04/01/22  3:36 PM Please send in refills  She will need to be on this medication indefinite   Thanks   [facility]   Garner Nash, DO  [facility]  04/01/2022 3:36 PM    I spoke with the pt's daughter and verified pharmacy. Rx sent for a year. Nothing further needed.

## 2022-04-02 ENCOUNTER — Encounter: Payer: Self-pay | Admitting: Hematology

## 2022-04-02 MED FILL — Dexamethasone Sodium Phosphate Inj 100 MG/10ML: INTRAMUSCULAR | Qty: 1 | Status: AC

## 2022-04-02 MED FILL — Fosaprepitant Dimeglumine For IV Infusion 150 MG (Base Eq): INTRAVENOUS | Qty: 5 | Status: AC

## 2022-04-02 NOTE — Progress Notes (Signed)
Called pt to introduce myself as her Arboriculturist and to discuss the [employer].  Pt was unavailable so I spoke to her daughter and went over what it covers and emailed her an expense sheet.  She would like to apply so she will provide proof of income.  If she qualifies I will approve her for the grant.

## 2022-04-03 ENCOUNTER — Inpatient Hospital Stay: Payer: Medicare Other

## 2022-04-03 ENCOUNTER — Encounter: Payer: Self-pay | Admitting: Hematology

## 2022-04-03 ENCOUNTER — Inpatient Hospital Stay (HOSPITAL_BASED_OUTPATIENT_CLINIC_OR_DEPARTMENT_OTHER): Payer: Medicare Other | Admitting: Hematology

## 2022-04-03 ENCOUNTER — Other Ambulatory Visit: Payer: Self-pay

## 2022-04-03 VITALS — BP 126/78 | HR 100 | Temp 98.0°F | Resp 18 | Wt 164.5 lb

## 2022-04-03 DIAGNOSIS — Z7189 Other specified counseling: Secondary | ICD-10-CM

## 2022-04-03 DIAGNOSIS — Z95828 Presence of other vascular implants and grafts: Secondary | ICD-10-CM

## 2022-04-03 DIAGNOSIS — C3492 Malignant neoplasm of unspecified part of left bronchus or lung: Secondary | ICD-10-CM

## 2022-04-03 DIAGNOSIS — Z5112 Encounter for antineoplastic immunotherapy: Secondary | ICD-10-CM | POA: Diagnosis not present

## 2022-04-03 LAB — CBC WITH DIFFERENTIAL (CANCER CENTER ONLY)
Abs Immature Granulocytes: 0.1 10*3/uL — ABNORMAL HIGH (ref 0.00–0.07)
Basophils Absolute: 0 10*3/uL (ref 0.0–0.1)
Basophils Relative: 1 %
Eosinophils Absolute: 0.1 10*3/uL (ref 0.0–0.5)
Eosinophils Relative: 2 %
HCT: 28 % — ABNORMAL LOW (ref 36.0–46.0)
Hemoglobin: 9.3 g/dL — ABNORMAL LOW (ref 12.0–15.0)
Immature Granulocytes: 3 %
Lymphocytes Relative: 20 %
Lymphs Abs: 0.7 10*3/uL (ref 0.7–4.0)
MCH: 33.1 pg (ref 26.0–34.0)
MCHC: 33.2 g/dL (ref 30.0–36.0)
MCV: 99.6 fL (ref 80.0–100.0)
Monocytes Absolute: 0.7 10*3/uL (ref 0.1–1.0)
Monocytes Relative: 18 %
Neutro Abs: 2.1 10*3/uL (ref 1.7–7.7)
Neutrophils Relative %: 56 %
Platelet Count: 324 10*3/uL (ref 150–400)
RBC: 2.81 MIL/uL — ABNORMAL LOW (ref 3.87–5.11)
RDW: 17.3 % — ABNORMAL HIGH (ref 11.5–15.5)
WBC Count: 3.7 10*3/uL — ABNORMAL LOW (ref 4.0–10.5)
nRBC: 0 % (ref 0.0–0.2)

## 2022-04-03 LAB — CMP (CANCER CENTER ONLY)
ALT: 13 U/L (ref 0–44)
AST: 44 U/L — ABNORMAL HIGH (ref 15–41)
Albumin: 3.2 g/dL — ABNORMAL LOW (ref 3.5–5.0)
Alkaline Phosphatase: 195 U/L — ABNORMAL HIGH (ref 38–126)
Anion gap: 6 (ref 5–15)
BUN: 11 mg/dL (ref 8–23)
CO2: 30 mmol/L (ref 22–32)
Calcium: 8.3 mg/dL — ABNORMAL LOW (ref 8.9–10.3)
Chloride: 101 mmol/L (ref 98–111)
Creatinine: 0.43 mg/dL — ABNORMAL LOW (ref 0.44–1.00)
GFR, Estimated: >60 mL/min (ref >60)
Glucose, Bld: 129 mg/dL — ABNORMAL HIGH (ref 70–99)
Potassium: 3.8 mmol/L (ref 3.5–5.1)
Sodium: 137 mmol/L (ref 135–145)
Total Bilirubin: 0.5 mg/dL (ref 0.3–1.2)
Total Protein: 6.4 g/dL — ABNORMAL LOW (ref 6.5–8.1)

## 2022-04-03 LAB — TSH: TSH: 2.892 u[IU]/mL (ref 0.350–4.500)

## 2022-04-03 MED ORDER — SODIUM CHLORIDE 0.9 % IV SOLN
150.0000 mg | Freq: Once | INTRAVENOUS | Status: AC
  Administered 2022-04-03: 150 mg via INTRAVENOUS
  Filled 2022-04-03: qty 150

## 2022-04-03 MED ORDER — SODIUM CHLORIDE 0.9 % IV SOLN
370.0000 mg | Freq: Once | INTRAVENOUS | Status: AC
  Administered 2022-04-03: 370 mg via INTRAVENOUS
  Filled 2022-04-03: qty 37

## 2022-04-03 MED ORDER — SODIUM CHLORIDE 0.9 % IV SOLN
500.0000 mg/m2 | Freq: Once | INTRAVENOUS | Status: AC
  Administered 2022-04-03: 900 mg via INTRAVENOUS
  Filled 2022-04-03: qty 20

## 2022-04-03 MED ORDER — CYANOCOBALAMIN 1000 MCG/ML IJ SOLN
1000.0000 ug | Freq: Once | INTRAMUSCULAR | Status: AC
  Administered 2022-04-03: 1000 ug via SUBCUTANEOUS
  Filled 2022-04-03: qty 1

## 2022-04-03 MED ORDER — SODIUM CHLORIDE 0.9 % IV SOLN
200.0000 mg | Freq: Once | INTRAVENOUS | Status: AC
  Administered 2022-04-03: 200 mg via INTRAVENOUS
  Filled 2022-04-03: qty 200

## 2022-04-03 MED ORDER — SODIUM CHLORIDE 0.9 % IV SOLN: Freq: Once | INTRAVENOUS | Status: AC

## 2022-04-03 MED ORDER — SODIUM CHLORIDE 0.9 % IV SOLN
10.0000 mg | Freq: Once | INTRAVENOUS | Status: AC
  Administered 2022-04-03: 10 mg via INTRAVENOUS
  Filled 2022-04-03: qty 10

## 2022-04-03 MED ORDER — PALONOSETRON HCL INJECTION 0.25 MG/5ML
0.2500 mg | Freq: Once | INTRAVENOUS | Status: AC
  Administered 2022-04-03: 0.25 mg via INTRAVENOUS
  Filled 2022-04-03: qty 5

## 2022-04-03 MED ORDER — SODIUM CHLORIDE 0.9% FLUSH
10.0000 mL | INTRAVENOUS | Status: AC | PRN
  Administered 2022-04-03: 10 mL

## 2022-04-03 MED ORDER — SODIUM CHLORIDE 0.9% FLUSH
10.0000 mL | INTRAVENOUS | Status: DC | PRN
  Administered 2022-04-03: 10 mL

## 2022-04-03 NOTE — Patient Instructions (Signed)
[facility]   Discharge Instructions: Thank you for choosing [facility] to provide your oncology and hematology care.   If you have a lab appointment with the [facility], please go directly to the [facility] and check in at the registration area.   Wear comfortable clothing and clothing appropriate for easy access to any Portacath or PICC line.   We strive to give you quality time with your provider. You may need to reschedule your appointment if you arrive late (15 or more minutes).  Arriving late affects you and other patients whose appointments are after yours.  Also, if you miss three or more appointments without notifying the office, you may be dismissed from the clinic at the provider's discretion.      For prescription refill requests, have your pharmacy contact our office and allow 72 hours for refills to be completed.    Today you received the following chemotherapy and/or immunotherapy agents: pembrolizumab, pemetrexed, carboplatin      To help prevent nausea and vomiting after your treatment, we encourage you to take your nausea medication as directed.  BELOW ARE SYMPTOMS THAT SHOULD BE REPORTED IMMEDIATELY: *FEVER GREATER THAN 100.4 F (38 C) OR HIGHER *CHILLS OR SWEATING *NAUSEA AND VOMITING THAT IS NOT CONTROLLED WITH YOUR NAUSEA MEDICATION *UNUSUAL SHORTNESS OF BREATH *UNUSUAL BRUISING OR BLEEDING *URINARY PROBLEMS (pain or burning when urinating, or frequent urination) *BOWEL PROBLEMS (unusual diarrhea, constipation, pain near the anus) TENDERNESS IN MOUTH AND THROAT WITH OR WITHOUT PRESENCE OF ULCERS (sore throat, sores in mouth, or a toothache) UNUSUAL RASH, SWELLING OR PAIN  UNUSUAL VAGINAL DISCHARGE OR ITCHING   Items with * indicate a potential emergency and should be followed up as soon as possible or go to the Emergency Department if any problems should occur.  Please show the CHEMOTHERAPY ALERT CARD or  IMMUNOTHERAPY ALERT CARD at check-in to the Emergency Department and triage nurse.  Should you have questions after your visit or need to cancel or reschedule your appointment, please contact [facility]  Dept: [phone]  and follow the prompts.  Office hours are 8:00 a.m. to 4:30 p.m. Monday - Friday. Please note that voicemails left after 4:00 p.m. may not be returned until the following business day.  We are closed weekends and major holidays. You have access to a nurse at all times for urgent questions. Please call the main number to the clinic Dept: [phone] and follow the prompts.   For any non-urgent questions, you may also contact your provider using MyChart. We now offer e-Visits for anyone 59 and older to request care online for non-urgent symptoms. For details visit [email or web address].   Also download the MyChart app! Go to the app store, search "MyChart", open the app, select [facility], and log in with your MyChart username and password.  Masks are optional in the cancer centers. If you would like for your care team to wear a mask while they are taking care of you, please let them know. You may have one support person who is at least 65 years old accompany you for your appointments.

## 2022-04-03 NOTE — Progress Notes (Signed)
Pt is approved for the $1000 [employer] grant.  

## 2022-04-03 NOTE — Progress Notes (Signed)
Spoke w/ MD Irene Limbo and he would like for pharmacy to adjust doses of Alimta and carboplatin based on updated weight & BSA.   Larene Beach, PharmD

## 2022-04-05 LAB — T4: T4, Total: 9.4 ug/dL (ref 4.5–12.0)

## 2022-04-06 ENCOUNTER — Encounter: Payer: Self-pay | Admitting: Neurology

## 2022-04-06 ENCOUNTER — Ambulatory Visit (INDEPENDENT_AMBULATORY_CARE_PROVIDER_SITE_OTHER): Payer: Medicare Other | Admitting: Neurology

## 2022-04-06 VITALS — BP 125/78 | HR 83 | Ht 63.0 in

## 2022-04-06 DIAGNOSIS — G3184 Mild cognitive impairment, so stated: Secondary | ICD-10-CM

## 2022-04-06 DIAGNOSIS — G08 Intracranial and intraspinal phlebitis and thrombophlebitis: Secondary | ICD-10-CM | POA: Diagnosis not present

## 2022-04-06 DIAGNOSIS — C3490 Malignant neoplasm of unspecified part of unspecified bronchus or lung: Secondary | ICD-10-CM

## 2022-04-06 DIAGNOSIS — R413 Other amnesia: Secondary | ICD-10-CM

## 2022-04-06 DIAGNOSIS — R2681 Unsteadiness on feet: Secondary | ICD-10-CM

## 2022-04-06 NOTE — Patient Instructions (Signed)
I had a long discussion with the patient and her sister regarding her cerebral venous. 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  I recommend she continue long-term anticoagulation with Eliquis he seems to be tolerating well without bleeding or side effects.  I also encouraged her to maintain good hydration and drink at least 8-10 glasses of liquids/fluids a day.  I also MDQ memory panel labs and to look for reversible causes mild cognitive impairment which is likely related to chemotherapy effect and encouraged her to do memory compensation activities and participate in cognitively challenging.  Activities like solving crossword puzzles, playing bridge and sudoku.  She will return for follow-up in 6 months with my nurse practitioner or call earlier if necessary.  Memory Compensation Strategies  Use "WARM" strategy.  W= write it down  A= associate it  R= repeat it  M= make a mental note  2.   You can keep a Social worker.  Use a 3-ring notebook with sections for the following: calendar, important names and phone numbers,  medications, doctors' names/phone numbers, lists/reminders, and a section to journal what you did  each day.   3.    Use a calendar to write appointments down.  4.    Write yourself a schedule for the day.  This can be placed on the calendar or in a separate section of the Memory Notebook.  Keeping a  regular schedule can help memory.  5.    Use medication organizer with sections for each day or morning/evening pills.  You may need help loading it  6.    Keep a basket, or pegboard by the door.  Place items that you need to take out with you in the basket or on the pegboard.  You may also want to  include a message board for reminders.  7.    Use sticky notes.  Place sticky notes with reminders in a place where the task is performed.  For example: " turn off the  stove" placed by the stove, "lock the  door" placed on the door at eye level, " take your medications" on  the bathroom mirror or by the place where you normally take your medications.  8.    Use alarms/timers.  Use while cooking to remind yourself to check on food or as a reminder to take your medicine, or as a  reminder to make a call, or as a reminder to perform another task, etc.

## 2022-04-06 NOTE — Progress Notes (Signed)
[facility] [address] [place]. [place] [address] [phone]       OFFICE FOLLOW-UP NOTE  [name] Date of Birth:  April 21, 1956 Medical Record Number:  [id number]   HPI: [name] is a pleasant 66 year old Caucasian lady seen today for postoperative follow-up visit following hospital consultation 23.  She is accompanied by her older sister.  History is obtained from them and review of electronic medical records.  I personally reviewed pertinent available imaging films in PACS.  She presented on 01/08/2022 with  PMH significant for GERD, fibromyalgia, hyperlipidemia, hypertension, recently discovered bronchogenic carcinoma with known lumbar osseous mets. She had MRI Brain outpatient to evaluate for brain mets and was found to have thrombus within proximal to mid straight sinus.  He reported mild tension type headache for the last few weeks/bandlike around her head intolerable.  She did not take any medication for these.  Right thigh pain and low back.  Neurological exam was exacerbated by back and leg pain otherwise nonfocal.  CT head shows mild disease.  MRI scan showed thrombosis of the straight sinus without significant cytotoxic edema,.  She was recently been diagnosed with non-small cell lung cancer stage IV with bony multiple metastasis.  Sees oncologist Dr. Irene Limbo who has titrated her on the chemotherapy regime carboplatin,Pembrolizumab and Pemetrexol every 21 days.  Patient was discharged from the hospital on Lovenox bridging with warfarin but since then she has been switched to Eliquis which she is tolerating well without bruising or bleeding.  He has back and leg pain and walking and balance difficulties and uses a walker.  He has had a few falls.  Is a wheelchair for long distances.  Patient has noticed short-term memory as well as cognitive issues and occasional confusion which may be from starting her chemotherapy.  ROS:   14 system review of systems is  positive for memory loss, confusion, gait imbalance, falls, tremor all other systems negative  PMH:  Past Medical History:  Diagnosis Date   Complication of anesthesia    "Hard to sedate", per pt and slow to wake up   COVID 10/2020   Degenerative joint disease    s/p cervical and lumbar fusions   Depression    pt denies   Encounter for preventive health examination    Next mammogram in 07/2009. Next colonoscopy in 09/2017. DEXA 4/09>Normal   Encounter for screening colonoscopy 10/03/2007   Normal   Fatty liver    Fibromyalgia    GERD (gastroesophageal reflux disease)    Hemorrhoid    hx of   History of hiatal hernia    Hyperlipidemia    Hypertension    Sciatica    Urinary tract infection 02/26/2022    Social History:  Social History   Socioeconomic History   Marital status: Married    Spouse name: Not on file   Number of children: Not on file   Years of education: Not on file   Highest education level: Not on file  Occupational History   Not on file  Tobacco Use   Smoking status: Former    Packs/day: 2.00    Years: 30.00    Total pack years: 60.00    Types: Cigarettes    Quit date: 04/03/2003    Years since quitting: 19.0   Smokeless tobacco: Never  Vaping Use   Vaping Use: Never used  Substance and Sexual Activity   Alcohol use: Not Currently    Alcohol/week: 0.0 standard drinks of alcohol  Drug use: No   Sexual activity: Not Currently  Other Topics Concern   Not on file  Social History Narrative   Not on file   Social Determinants of Health   Financial Resource Strain: Medium Risk (03/02/2021)   Overall Financial Resource Strain (CARDIA)    Difficulty of Paying Living Expenses: Somewhat hard  Food Insecurity: No Food Insecurity (03/02/2021)   Hunger Vital Sign    Worried About Running Out of Food in the Last Year: Never true    Ran Out of Food in the Last Year: Never true  Transportation Needs: No Transportation Needs (03/02/2021)   PRAPARE -  Hydrologist (Medical): No    Lack of Transportation (Non-Medical): No  Physical Activity: Inactive (03/02/2021)   Exercise Vital Sign    Days of Exercise per Week: 0 days    Minutes of Exercise per Session: 0 min  Stress: Stress Concern Present (03/02/2021)   [place]    Feeling of Stress : To some extent  Social Connections: Moderately Integrated (03/02/2021)   Social Connection and Isolation Panel [NHANES]    Frequency of Communication with Friends and Family: More than three times a week    Frequency of Social Gatherings with Friends and Family: More than three times a week    Attends Religious Services: More than 4 times per year    Active Member of [employer] or Organizations: No    Attends Archivist Meetings: Never    Marital Status: Married  Human resources officer Violence: Not At Risk (03/02/2021)   Humiliation, Afraid, Rape, and Kick questionnaire    Fear of Current or Ex-Partner: No    Emotionally Abused: No    Physically Abused: No    Sexually Abused: No    Medications:   Current Outpatient Medications on File Prior to Visit  Medication Sig Dispense Refill   ALPRAZolam (XANAX) 0.5 MG tablet Take 1 tablet (0.5 mg total) by mouth at bedtime as needed for anxiety. 30 tablet 1   apixaban (ELIQUIS) 5 MG TABS tablet Take 1 tablet (5 mg total) by mouth 2 (two) times daily. 60 tablet 11   apixaban (ELIQUIS) 5 MG TABS tablet Take 1 tablet (5 mg total) by mouth 2 (two) times daily. 60 tablet 1   bisacodyl (DULCOLAX) 10 MG suppository Place 1 suppository (10 mg total) rectally daily as needed for severe constipation. 12 suppository 0   cefpodoxime (VANTIN) 100 MG tablet Take 1 tablet (100 mg total) by mouth 2 (two) times daily. Take for 7 days 14 tablet 0   celecoxib (CELEBREX) 200 MG capsule Take 1 capsule (200 mg total) by mouth daily. 30 capsule 0   dexamethasone (DECADRON) 2 MG tablet  Take 1 tablet (2 mg total) by mouth daily. 30 tablet 0   dexamethasone (DECADRON) 4 MG tablet Take 1 tab 2 times daily starting day before pemetrexed. Then take 2 tabs daily x 3 days starting day after carboplatin. Take with food. 30 tablet 1   diphenhydrAMINE (BENADRYL) 25 MG tablet Take 25 mg by mouth daily as needed for allergies.     DULoxetine HCl 40 MG CPEP Take 40 mg by mouth daily. 30 capsule 0   ezetimibe (ZETIA) 10 MG tablet TAKE 1 TABLET (10 MG TOTAL) BY MOUTH DAILY. 90 tablet 0   folic acid (FOLVITE) 1 MG tablet Take 1 tablet (1 mg total) by mouth daily. Start 7 days before pemetrexed chemotherapy.  Continue until 21 days after pemetrexed completed. 100 tablet 3   hydrochlorothiazide (HYDRODIURIL) 12.5 MG tablet TAKE 1 TABLET (12.5 MG TOTAL) BY MOUTH DAILY. 90 tablet 1   lidocaine (SALONPAS PAIN RELIEVING) 4 % Place 1 patch onto the skin daily. 30 patch 1   lidocaine-prilocaine (EMLA) cream Apply to affected area once 30 g 3   Methocarbamol 1000 MG TABS Take 1,000 mg by mouth 3 (three) times daily. 90 tablet 0   Multiple Minerals-Vitamins (CAL MAG ZINC +D3 PO) Take 1 tablet by mouth 3 (three) times daily.     Multiple Vitamin (MULTIVITAMIN) capsule Take 1 capsule by mouth daily.     Nutritional Supplements (FEEDING SUPPLEMENT, KATE FARMS STANDARD 1.4,) LIQD liquid Take 325 mLs by mouth 2 (two) times daily between meals.     ondansetron (ZOFRAN) 8 MG tablet Take 1 tablet (8 mg total) by mouth every 8 (eight) hours as needed for nausea or vomiting. Start on the third day after carboplatin. 30 tablet 1   oxyCODONE ER (XTAMPZA ER) 27 MG C12A Take 2 capsules by mouth in the morning and at bedtime. 90 capsule 0   Oxycodone HCl 20 MG TABS Take 1-2 tablets (20-40 mg total) by mouth every 4 (four) hours as needed. 90 tablet 0   pantoprazole (PROTONIX) 20 MG tablet Take 1 tablet (20 mg total) by mouth 2 (two) times daily. 60 tablet 1   polyethylene glycol (MIRALAX / GLYCOLAX) 17 g packet Take 17 g  by mouth 2 (two) times daily. 30 each 1   pregabalin (LYRICA) 50 MG capsule Take 1 capsule (50 mg total) by mouth 2 (two) times daily. 60 capsule 0   prochlorperazine (COMPAZINE) 10 MG tablet Take 1 tablet (10 mg total) by mouth every 6 (six) hours as needed for nausea or vomiting. 30 tablet 1   senna-docusate (SENOKOT-S) 8.6-50 MG tablet Take 2 tablets by mouth 2 (two) times daily. 120 tablet 1   tamsulosin (FLOMAX) 0.4 MG CAPS capsule Take 1 capsule (0.4 mg total) by mouth daily after breakfast. 30 capsule 1   Vitamin D, Ergocalciferol, (DRISDOL) 1.25 MG (50000 UNIT) CAPS capsule Take 1 capsule (50,000 Units total) by mouth every 7 (seven) days. 5 capsule 0   No current facility-administered medications on file prior to visit.    Allergies:   Allergies  Allergen Reactions   Codeine     Nausea and vomiting, hallucinations, bad dreams    Morphine Shortness Of Breath    B/P dropped!   Beef-Derived Products Nausea And Vomiting    Alpha Gal   Doxycycline Nausea And Vomiting   Ibuprofen     Stomach Ulcers, stomach scarred    Iodinated Contrast Media Nausea And Vomiting    Pt reports n/v only. No other reaction noted. No need for 13 hr prep.    Latex Rash   Nsaids Other (See Comments)    PT has hx of ulcers.   Penicillins Nausea And Vomiting    hives   Pork-Derived Products Nausea And Vomiting   Tramadol     Caused RLS   Gabapentin Other (See Comments)    Pt states this made her feel like she was always depressed and not caring about anything    Physical Exam General: well developed, well nourished pleasant middle-aged Caucasian lady, seated, in no evident distress Head: head normocephalic and atraumatic.  Neck: supple with no carotid or supraclavicular bruits Cardiovascular: regular rate and rhythm, no murmurs Musculoskeletal: no deformity Skin:  no rash/petichiae  Vascular:  Normal pulses all extremities Vitals:   04/06/22 0942  BP: 125/78  Pulse: 83   Neurologic  Exam Mental Status: Awake and fully alert. Oriented to place and time. Recent and remote memory poor attention span, concentration and fund of knowledge diminished mood and affect appropriate.  Cranial Nerves: Fundoscopic exam reveals sharp disc margins. Pupils equal, briskly reactive to light. Extraocular movements full without nystagmus. Visual fields full to confrontation. Hearing intact. Facial sensation intact. Face, tongue, palate moves normally and symmetrically.  Clock drawing 3/4.  Geriatric depression scale scored 5 which is borderline for depression Motor: Normal bulk and tone. Normal strength in all tested extremity muscles.  Intermittent left lower extremity fine tremor which can be supressed voluntarily Sensory.: intact to touch ,pinprick .position and vibratory sensation.  Coordination: Rapid alternating movements normal in all extremities. Finger-to-nose and heel-to-shin performed accurately bilaterally. Gait and Station: Deferred as patient did not get her walker Reflexes: 2+ and symmetric. Toes downgoing.   NIHSS  2 Modified Rankin  3     04/06/2022   10:08 AM  MMSE - Mini Mental State Exam  Orientation to time 0  Orientation to Place 4  Registration 3  Attention/ Calculation 0  Recall 1  Language- name 2 objects 2  Language- repeat 1  Language- follow 3 step command 3  Language- read & follow direction 1  Write a sentence 1  Copy design 1  Total score 17     ASSESSMENT: 66 year old Caucasian lady with cerebral straight sinus thrombosis in August 2023 likely from hypercoagulability from stage IV non-small cell lung cancer.  He has residual gait ataxia as well as normal neuro memory loss and cognitive impairment likely from chemotherapy side effect     PLAN: I had a long discussion with the patient and her sister regarding her cerebral venous. 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  I  recommend she continue long-term anticoagulation with Eliquis he seems to be tolerating well without bleeding or side effects.  I also encouraged her to maintain good hydration and drink at least 8-10 glasses of liquids/fluids a day.  I also MDQ memory panel labs and to look for reversible causes mild cognitive impairment which is likely related to chemotherapy effect and encouraged her to do memory compensation activities and participate in cognitively challenging.  Activities like solving crossword puzzles, playing bridge and sudoku.  She was also advised to use a cane at all times and fall safety precautions.  She will return for follow-up in 6 months with my nurse practitioner or call earlier if necessary. Greater than 50% of time during this 45 minute visit was spent on counseling,explanation of diagnosis straight sinus thrombosis, memory loss and mild cognitive impairment, planning of further management, discussion with patient and family and coordination of care Antony Contras, MD Note: This document was prepared with digital dictation and possible smart phrase technology. Any transcriptional errors that result from this process are unintentional

## 2022-04-08 ENCOUNTER — Other Ambulatory Visit: Payer: Self-pay | Admitting: Nurse Practitioner

## 2022-04-08 MED ORDER — DULOXETINE HCL 40 MG PO CPEP: 40.0000 mg | ORAL_CAPSULE | Freq: Every day | ORAL | 2 refills | Status: AC

## 2022-04-10 ENCOUNTER — Encounter: Payer: Self-pay | Admitting: Hematology

## 2022-04-10 NOTE — Progress Notes (Addendum)
HEMATOLOGY/ONCOLOGY CLINIC NOTE  Date of Service: 04/03/2022   Patient Care Team: Lorrene Reid, PA-C as PCP - General Roseanne Kaufman, MD as Consulting Physician (Orthopedic Surgery) Druscilla Brownie, MD as Consulting Physician (Dermatology) Dalton-Bethea, Fabio Asa, MD (Physical Medicine and Rehabilitation) Glenna Fellows, MD as Attending Physician (Neurosurgery) Shela Leff, MD as Resident (Internal Medicine) Armbruster, Carlota Raspberry, MD as Consulting Physician (Gastroenterology) Pickenpack-Cousar, Carlena Sax, NP as Nurse Practitioner (Nurse Practitioner)  CHIEF COMPLAINTS/PURPOSE OF CONSULTATION:  Follow-up for continued evaluation and management of stage IV metastatic non-small cell lung cancer, adenocarcinoma.  HISTORY OF PRESENTING ILLNESS: Please see previous note for details on initial presentation   Interval history  Misty Decker is here for continued evaluation and management of metastatic non-small cell lung cancer. She is here for her third cycle of carboplatin Alimta pembrolizumab chemoimmunotherapy. She is accompanied by her daughter who notes that the patient has been doing fairly well.  Some intermittent confusion which has improved as her need for p.o. pain medications is improved.. Fevers no chills no night sweats.  Overall her bone pains have been better.  No chest pain or shortness of breath. Labs done today were discussed in detail with the patient and her daughter.  MEDICAL HISTORY:  Past Medical History:  Diagnosis Date   Complication of anesthesia    "Hard to sedate", per pt and slow to wake up   COVID 10/2020   Degenerative joint disease    s/p cervical and lumbar fusions   Depression    pt denies   Encounter for preventive health examination    Next mammogram in 07/2009. Next colonoscopy in 09/2017. DEXA 4/09>Normal   Encounter for screening colonoscopy 10/03/2007   Normal   Fatty liver    Fibromyalgia    GERD (gastroesophageal reflux  disease)    Hemorrhoid    hx of   History of hiatal hernia    Hyperlipidemia    Hypertension    Sciatica    Urinary tract infection 02/26/2022    SURGICAL HISTORY: Past Surgical History:  Procedure Laterality Date   ABDOMINAL HYSTERECTOMY N/A    [facility] 11/21/2019   APPENDECTOMY N/A    [facility] 11/21/2019   BRONCHIAL NEEDLE ASPIRATION BIOPSY  01/06/2022   Procedure: BRONCHIAL NEEDLE ASPIRATION BIOPSIES;  Surgeon: Garner Nash, DO;  Location: [facility];  Service: Pulmonary;;   CERVICAL FUSION  2010   X 2   IR IMAGING GUIDED PORT INSERTION  03/04/2022   LUMBAR FUSION  1997   X 2/has plate and screws and cage   OVARIAN CYST SURGERY  1980   pelvic prolapse  1987   RECTAL PROLAPSE REPAIR     SPINE SURGERY N/A    [facility] 11/21/2019   TONSILLECTOMY     and adenoids   TOTAL ABDOMINAL HYSTERECTOMY  1987   VIDEO BRONCHOSCOPY WITH ENDOBRONCHIAL ULTRASOUND Left 01/06/2022   Procedure: VIDEO BRONCHOSCOPY WITH ENDOBRONCHIAL ULTRASOUND;  Surgeon: Garner Nash, DO;  Location: [facility];  Service: Pulmonary;  Laterality: Left;    SOCIAL HISTORY: Social History   Socioeconomic History   Marital status: Married    Spouse name: Not on file   Number of children: Not on file   Years of education: Not on file   Highest education level: Not on file  Occupational History   Not on file  Tobacco Use   Smoking status: Former    Packs/day: 2.00    Years: 30.00    Total pack years: 60.00  Types: Cigarettes    Quit date: 04/03/2003    Years since quitting: 19.0   Smokeless tobacco: Never  Vaping Use   Vaping Use: Never used  Substance and Sexual Activity   Alcohol use: Not Currently    Alcohol/week: 0.0 standard drinks of alcohol   Drug use: No   Sexual activity: Not Currently  Other Topics Concern   Not on file  Social History Narrative   Not on file   Social Determinants of Health   Financial Resource Strain: Medium Risk (03/02/2021)   Overall Financial Resource  Strain (CARDIA)    Difficulty of Paying Living Expenses: Somewhat hard  Food Insecurity: No Food Insecurity (03/02/2021)   Hunger Vital Sign    Worried About Running Out of Food in the Last Year: Never true    Ran Out of Food in the Last Year: Never true  Transportation Needs: No Transportation Needs (03/02/2021)   PRAPARE - Hydrologist (Medical): No    Lack of Transportation (Non-Medical): No  Physical Activity: Inactive (03/02/2021)   Exercise Vital Sign    Days of Exercise per Week: 0 days    Minutes of Exercise per Session: 0 min  Stress: Stress Concern Present (03/02/2021)   [place]    Feeling of Stress : To some extent  Social Connections: Moderately Integrated (03/02/2021)   Social Connection and Isolation Panel [NHANES]    Frequency of Communication with Friends and Family: More than three times a week    Frequency of Social Gatherings with Friends and Family: More than three times a week    Attends Religious Services: More than 4 times per year    Active Member of [employer] or Organizations: No    Attends Archivist Meetings: Never    Marital Status: Married  Human resources officer Violence: Not At Risk (03/02/2021)   Humiliation, Afraid, Rape, and Kick questionnaire    Fear of Current or Ex-Partner: No    Emotionally Abused: No    Physically Abused: No    Sexually Abused: No    FAMILY HISTORY: Family History  Problem Relation Age of Onset   Cancer Mother    Diabetes Mother    Breast cancer Mother    Heart attack Mother    Hyperlipidemia Mother    Hyperlipidemia Father    Hypertension Father    Aneurysm Father    Dementia Maternal Grandfather    Cancer Paternal Grandmother        intestinal    ALLERGIES:  is allergic to codeine, morphine, beef-derived products, doxycycline, ibuprofen, iodinated contrast media, latex, nsaids, penicillins, pork-derived products,  tramadol, and gabapentin.  MEDICATIONS:  Current Outpatient Medications  Medication Sig Dispense Refill   ALPRAZolam (XANAX) 0.5 MG tablet Take 1 tablet (0.5 mg total) by mouth at bedtime as needed for anxiety. 30 tablet 1   apixaban (ELIQUIS) 5 MG TABS tablet Take 1 tablet (5 mg total) by mouth 2 (two) times daily. 60 tablet 11   apixaban (ELIQUIS) 5 MG TABS tablet Take 1 tablet (5 mg total) by mouth 2 (two) times daily. 60 tablet 1   bisacodyl (DULCOLAX) 10 MG suppository Place 1 suppository (10 mg total) rectally daily as needed for severe constipation. 12 suppository 0   cefpodoxime (VANTIN) 100 MG tablet Take 1 tablet (100 mg total) by mouth 2 (two) times daily. Take for 7 days 14 tablet 0   celecoxib (CELEBREX) 200 MG capsule Take  1 capsule (200 mg total) by mouth daily. 30 capsule 0   dexamethasone (DECADRON) 2 MG tablet Take 1 tablet (2 mg total) by mouth daily. 30 tablet 0   dexamethasone (DECADRON) 4 MG tablet Take 1 tab 2 times daily starting day before pemetrexed. Then take 2 tabs daily x 3 days starting day after carboplatin. Take with food. 30 tablet 1   diphenhydrAMINE (BENADRYL) 25 MG tablet Take 25 mg by mouth daily as needed for allergies.     DULoxetine HCl 40 MG CPEP Take 40 mg by mouth daily. 30 capsule 2   ezetimibe (ZETIA) 10 MG tablet TAKE 1 TABLET (10 MG TOTAL) BY MOUTH DAILY. 90 tablet 0   folic acid (FOLVITE) 1 MG tablet Take 1 tablet (1 mg total) by mouth daily. Start 7 days before pemetrexed chemotherapy. Continue until 21 days after pemetrexed completed. 100 tablet 3   hydrochlorothiazide (HYDRODIURIL) 12.5 MG tablet TAKE 1 TABLET (12.5 MG TOTAL) BY MOUTH DAILY. 90 tablet 1   lidocaine (SALONPAS PAIN RELIEVING) 4 % Place 1 patch onto the skin daily. 30 patch 1   lidocaine-prilocaine (EMLA) cream Apply to affected area once 30 g 3   Methocarbamol 1000 MG TABS Take 1,000 mg by mouth 3 (three) times daily. 90 tablet 0   Multiple Minerals-Vitamins (CAL MAG ZINC +D3 PO)  Take 1 tablet by mouth 3 (three) times daily.     Multiple Vitamin (MULTIVITAMIN) capsule Take 1 capsule by mouth daily.     Nutritional Supplements (FEEDING SUPPLEMENT, KATE FARMS STANDARD 1.4,) LIQD liquid Take 325 mLs by mouth 2 (two) times daily between meals.     ondansetron (ZOFRAN) 8 MG tablet Take 1 tablet (8 mg total) by mouth every 8 (eight) hours as needed for nausea or vomiting. Start on the third day after carboplatin. 30 tablet 1   oxyCODONE ER (XTAMPZA ER) 27 MG C12A Take 2 capsules by mouth in the morning and at bedtime. 90 capsule 0   Oxycodone HCl 20 MG TABS Take 1-2 tablets (20-40 mg total) by mouth every 4 (four) hours as needed. 90 tablet 0   pantoprazole (PROTONIX) 20 MG tablet Take 1 tablet (20 mg total) by mouth 2 (two) times daily. 60 tablet 1   polyethylene glycol (MIRALAX / GLYCOLAX) 17 g packet Take 17 g by mouth 2 (two) times daily. 30 each 1   pregabalin (LYRICA) 50 MG capsule Take 1 capsule (50 mg total) by mouth 2 (two) times daily. 60 capsule 0   prochlorperazine (COMPAZINE) 10 MG tablet Take 1 tablet (10 mg total) by mouth every 6 (six) hours as needed for nausea or vomiting. 30 tablet 1   senna-docusate (SENOKOT-S) 8.6-50 MG tablet Take 2 tablets by mouth 2 (two) times daily. 120 tablet 1   tamsulosin (FLOMAX) 0.4 MG CAPS capsule Take 1 capsule (0.4 mg total) by mouth daily after breakfast. 30 capsule 1   Vitamin D, Ergocalciferol, (DRISDOL) 1.25 MG (50000 UNIT) CAPS capsule Take 1 capsule (50,000 Units total) by mouth every 7 (seven) days. 5 capsule 0   No current facility-administered medications for this visit.    REVIEW OF SYSTEMS:   10 Point review of Systems was done is negative except as noted above.  PHYSICAL EXAMINATION: ECOG PERFORMANCE STATUS: 1 - Symptomatic but completely ambulatory  Vital signs reviewed NAD GENERAL:alert, in no acute distress and comfortable SKIN: no acute rashes, no significant lesions EYES: conjunctiva are pink and  non-injected, sclera anicteric OROPHARYNX: MMM, no exudates, no oropharyngeal  erythema or ulceration NECK: supple, no JVD LYMPH:  no palpable lymphadenopathy in the cervical, axillary or inguinal regions LUNGS: clear to auscultation b/l with normal respiratory effort HEART: regular rate & rhythm ABDOMEN:  normoactive bowel sounds , non tender, not distended. Extremity: no pedal edema PSYCH: alert & oriented x 3 with fluent speech NEURO: no focal motor/sensory deficits  LABORATORY DATA:  I have reviewed the data as listed  .    Latest Ref Rng & Units 04/03/2022   11:25 AM 03/10/2022   12:19 PM 02/20/2022   11:18 AM  CBC  WBC 4.0 - 10.5 K/uL 3.7  3.7  6.0   Hemoglobin 12.0 - 15.0 g/dL 9.3  9.8  10.9   Hematocrit 36.0 - 46.0 % 28.0  29.9  32.7   Platelets 150 - 400 K/uL 324  445  335    .    Latest Ref Rng & Units 04/03/2022   11:25 AM 03/10/2022   12:19 PM 02/20/2022   11:18 AM  CMP  Glucose 70 - 99 mg/dL 129  127  175   BUN 8 - 23 mg/dL 11  10  25    Creatinine 0.44 - 1.00 mg/dL 0.43  0.47  0.58   Sodium 135 - 145 mmol/L 137  135  135   Potassium 3.5 - 5.1 mmol/L 3.8  3.9  4.2   Chloride 98 - 111 mmol/L 101  99  103   CO2 22 - 32 mmol/L 30  32  24   Calcium 8.9 - 10.3 mg/dL 8.3  8.5  8.7   Total Protein 6.5 - 8.1 g/dL 6.4  5.8  6.7   Total Bilirubin 0.3 - 1.2 mg/dL 0.5  0.5  0.5   Alkaline Phos 38 - 126 U/L 195  226  190   AST 15 - 41 U/L 44  34  30   ALT 0 - 44 U/L 13  17  23     GYN CYTOLOGY - NON PAP  CASE: [id number]  PATIENT: Misty Decker  Non-Gynecological Cytology Report      Clinical History: Lung mass, adenopathy     FINAL MICROSCOPIC DIAGNOSIS:   A. LYMPH NODE, STATION 7, FINE NEEDLE ASPIRATION:  - Malignant cells consistent with adenocarcinoma, see comment       COMMENT:   Immunohistochemical stains show that the tumor cells are negative for  TTF-1 and cytokeratin AE1/AE3 (membranous staining); while they are  negative for  synaptophysin, CD56, p63 and CK5/6.  This immunoprofile is  consistent with above interpretation.  Dr. Arby Barrette reviewed the case  and concurs with the above diagnosis.  Dr. Valeta Harms was notified on  01/09/2022.        RADIOGRAPHIC STUDIES: I have personally reviewed the radiological images as listed and agreed with the findings in the report. No results found.   ASSESSMENT & PLAN:   66 y.o. very pleasant female with  1. Stage IV metastatic non-small cell lung cancer, adenocarcinoma with extensive bone mets -12/26/2021 CT chest/abdomen/pelvis -left lower lobe mass measuring 5.3 x 4.1 x 4.5 cm, diffuse lytic and sclerotic lesions in the bones with possible pathologic fracture at L2 -01/05/2022 PET scan-left lower lobe lung mass intensely FDG avid, left upper lobe pulmonary nodule suspicious for pulmonary metastasis, FDG avid ipsilateral hilar, ipsilateral mediastinal, subcarinal, and contralateral right mediastinal nodal metastasis, widespread FDG avid lytic bone metastases with lytic lesion within the right proximal femur which may be of orthopedic significance. -01/08/2022 MRI brain with and without contrast-thrombus within  the straight sinus, no evidence of intracranial metastatic disease.   2) posterior left iliac lesion/right distal inferior femoral neck, intertrochanteric, and lesser trochanteric lytic lesion at risk for pathologic fracture -01/09/2022 CT femur right-diffuse lytic metastases seen throughout the pelvis and proximal right femur, dominant right distal inferior femoral neck, intertrochanteric, and lesser trochanteric lytic lesion-increase his susceptibility for risk of pathologic fracture -01/09/2022 CT femur left-numerous lytic metastases within the pelvis, dominant partially visualized posterior left iliac lesion measuring 2.5 x 5 cm, smaller superolateral left acetabular 1.9 cm lytic lesion which abuts the lateral cortex without definite cortical breakthrough   3) thrombus straight  sinus -Seen by neurology who recommended full dose Lovenox x5 days followed by dabigatran 150 mg p.o. twice daily. -Patient continues to be on apixaban.  4) hypertension   5) hyperlipidemia   6) fibromyalgia   7) h/o cervical and lumbar spinal fusion with chronic back pain issues with L2 compression fracture  Plan Results from today were discussed in detail with the patient and her daughter Patient has some chemotherapy-induced anemia with a hemoglobin of 9.3 platelets are normal at 324k WBC count of 3.7k with ANC of 2.1k CMP stable Patient with no prohibitive toxicities related to her current chemotherapy She is appropriate to proceed with her cycle 3 of carboplatin/Alimta/pembrolizumab chemoimmunotherapy. Chemoimmunotherapy orders reviewed and placed. Continue Zometa monthly for bone metastasis. Encouraged to drink at least 2 L of water daily. Is following with outpatient palliative care for symptom management. -Frequent changes of her urinary catheter and infection precautions.  Follow up: Follow-up and subsequent treatments as per integrated scheduling   The total time spent in the appointment was 30 minutes*.  All of the patient's questions were answered with apparent satisfaction. The patient knows to call the clinic with any problems, questions or concerns.   Sullivan Lone MD MS AAHIVMS [place] [facility] Hematology/Oncology Physician [facility]  .*Total Encounter Time as defined by the Centers for Medicare and Medicaid Services includes, in addition to the face-to-face time of a patient visit (documented in the note above) non-face-to-face time: obtaining and reviewing outside history, ordering and reviewing medications, tests or procedures, care coordination (communications with other health care professionals or caregivers) and documentation in the medical record.

## 2022-04-14 ENCOUNTER — Ambulatory Visit (INDEPENDENT_AMBULATORY_CARE_PROVIDER_SITE_OTHER): Payer: Medicare Other | Admitting: Neurology

## 2022-04-14 DIAGNOSIS — R413 Other amnesia: Secondary | ICD-10-CM

## 2022-04-14 DIAGNOSIS — R4182 Altered mental status, unspecified: Secondary | ICD-10-CM

## 2022-04-16 ENCOUNTER — Other Ambulatory Visit: Payer: Self-pay | Admitting: Nurse Practitioner

## 2022-04-16 ENCOUNTER — Encounter: Payer: Self-pay | Admitting: Hematology

## 2022-04-16 ENCOUNTER — Other Ambulatory Visit (HOSPITAL_COMMUNITY): Payer: Self-pay

## 2022-04-16 DIAGNOSIS — Z515 Encounter for palliative care: Secondary | ICD-10-CM

## 2022-04-16 DIAGNOSIS — C3492 Malignant neoplasm of unspecified part of left bronchus or lung: Secondary | ICD-10-CM

## 2022-04-16 DIAGNOSIS — G893 Neoplasm related pain (acute) (chronic): Secondary | ICD-10-CM

## 2022-04-16 MED ORDER — XTAMPZA ER 13.5 MG PO C12A
13.5000 mg | EXTENDED_RELEASE_CAPSULE | Freq: Two times a day (BID) | ORAL | 0 refills | Status: AC
  Filled 2022-04-16: qty 60, 30d supply, fill #0

## 2022-04-22 ENCOUNTER — Other Ambulatory Visit (HOSPITAL_COMMUNITY): Payer: Self-pay

## 2022-04-23 NOTE — Progress Notes (Signed)
Kindly inform the patient that EEG or brainwave study showed mild slowing of the brainwave activity which is a nonspecific finding seen in variety of conditions including memory loss.  No definite seizure activity or worrisome finding noted

## 2022-04-24 ENCOUNTER — Other Ambulatory Visit: Payer: Self-pay | Admitting: Hematology

## 2022-04-24 ENCOUNTER — Inpatient Hospital Stay (HOSPITAL_BASED_OUTPATIENT_CLINIC_OR_DEPARTMENT_OTHER): Payer: Medicare Other | Admitting: Hematology

## 2022-04-24 ENCOUNTER — Inpatient Hospital Stay: Payer: Medicare Other

## 2022-04-24 ENCOUNTER — Inpatient Hospital Stay (HOSPITAL_BASED_OUTPATIENT_CLINIC_OR_DEPARTMENT_OTHER): Payer: Medicare Other | Admitting: Nurse Practitioner

## 2022-04-24 ENCOUNTER — Other Ambulatory Visit: Payer: Self-pay

## 2022-04-24 ENCOUNTER — Encounter: Payer: Self-pay | Admitting: Nurse Practitioner

## 2022-04-24 ENCOUNTER — Inpatient Hospital Stay: Payer: Medicare Other | Attending: Hematology

## 2022-04-24 VITALS — BP 134/72 | HR 92 | Temp 98.2°F | Resp 17 | Wt 164.0 lb

## 2022-04-24 VITALS — BP 137/71 | HR 83 | Resp 17

## 2022-04-24 DIAGNOSIS — C3492 Malignant neoplasm of unspecified part of left bronchus or lung: Secondary | ICD-10-CM

## 2022-04-24 DIAGNOSIS — M797 Fibromyalgia: Secondary | ICD-10-CM | POA: Insufficient documentation

## 2022-04-24 DIAGNOSIS — I10 Essential (primary) hypertension: Secondary | ICD-10-CM | POA: Diagnosis not present

## 2022-04-24 DIAGNOSIS — Z5111 Encounter for antineoplastic chemotherapy: Secondary | ICD-10-CM | POA: Diagnosis present

## 2022-04-24 DIAGNOSIS — Z87891 Personal history of nicotine dependence: Secondary | ICD-10-CM | POA: Insufficient documentation

## 2022-04-24 DIAGNOSIS — C419 Malignant neoplasm of bone and articular cartilage, unspecified: Secondary | ICD-10-CM | POA: Diagnosis not present

## 2022-04-24 DIAGNOSIS — Z5112 Encounter for antineoplastic immunotherapy: Secondary | ICD-10-CM | POA: Insufficient documentation

## 2022-04-24 DIAGNOSIS — K5903 Drug induced constipation: Secondary | ICD-10-CM | POA: Diagnosis not present

## 2022-04-24 DIAGNOSIS — E785 Hyperlipidemia, unspecified: Secondary | ICD-10-CM | POA: Insufficient documentation

## 2022-04-24 DIAGNOSIS — F32A Depression, unspecified: Secondary | ICD-10-CM | POA: Insufficient documentation

## 2022-04-24 DIAGNOSIS — Z7189 Other specified counseling: Secondary | ICD-10-CM

## 2022-04-24 DIAGNOSIS — K5900 Constipation, unspecified: Secondary | ICD-10-CM | POA: Diagnosis not present

## 2022-04-24 DIAGNOSIS — Z95828 Presence of other vascular implants and grafts: Secondary | ICD-10-CM

## 2022-04-24 DIAGNOSIS — R918 Other nonspecific abnormal finding of lung field: Secondary | ICD-10-CM

## 2022-04-24 DIAGNOSIS — Z515 Encounter for palliative care: Secondary | ICD-10-CM

## 2022-04-24 DIAGNOSIS — C3432 Malignant neoplasm of lower lobe, left bronchus or lung: Secondary | ICD-10-CM | POA: Insufficient documentation

## 2022-04-24 DIAGNOSIS — G893 Neoplasm related pain (acute) (chronic): Secondary | ICD-10-CM | POA: Insufficient documentation

## 2022-04-24 DIAGNOSIS — R530 Neoplastic (malignant) related fatigue: Secondary | ICD-10-CM

## 2022-04-24 LAB — CBC WITH DIFFERENTIAL (CANCER CENTER ONLY)
Abs Immature Granulocytes: 0.14 10*3/uL — ABNORMAL HIGH (ref 0.00–0.07)
Basophils Absolute: 0 10*3/uL (ref 0.0–0.1)
Basophils Relative: 0 %
Eosinophils Absolute: 0 10*3/uL (ref 0.0–0.5)
Eosinophils Relative: 0 %
HCT: 30.4 % — ABNORMAL LOW (ref 36.0–46.0)
Hemoglobin: 9.9 g/dL — ABNORMAL LOW (ref 12.0–15.0)
Immature Granulocytes: 4 %
Lymphocytes Relative: 18 %
Lymphs Abs: 0.6 10*3/uL — ABNORMAL LOW (ref 0.7–4.0)
MCH: 32.5 pg (ref 26.0–34.0)
MCHC: 32.6 g/dL (ref 30.0–36.0)
MCV: 99.7 fL (ref 80.0–100.0)
Monocytes Absolute: 0.5 10*3/uL (ref 0.1–1.0)
Monocytes Relative: 14 %
Neutro Abs: 2.3 10*3/uL (ref 1.7–7.7)
Neutrophils Relative %: 64 %
Platelet Count: 522 10*3/uL — ABNORMAL HIGH (ref 150–400)
RBC: 3.05 MIL/uL — ABNORMAL LOW (ref 3.87–5.11)
RDW: 17.4 % — ABNORMAL HIGH (ref 11.5–15.5)
WBC Count: 3.5 10*3/uL — ABNORMAL LOW (ref 4.0–10.5)
nRBC: 0 % (ref 0.0–0.2)

## 2022-04-24 LAB — CMP (CANCER CENTER ONLY)
ALT: 13 U/L (ref 0–44)
AST: 40 U/L (ref 15–41)
Albumin: 3.6 g/dL (ref 3.5–5.0)
Alkaline Phosphatase: 213 U/L — ABNORMAL HIGH (ref 38–126)
Anion gap: 6 (ref 5–15)
BUN: 8 mg/dL (ref 8–23)
CO2: 31 mmol/L (ref 22–32)
Calcium: 8.8 mg/dL — ABNORMAL LOW (ref 8.9–10.3)
Chloride: 101 mmol/L (ref 98–111)
Creatinine: 0.38 mg/dL — ABNORMAL LOW (ref 0.44–1.00)
GFR, Estimated: >60 mL/min (ref >60)
Glucose, Bld: 161 mg/dL — ABNORMAL HIGH (ref 70–99)
Potassium: 3.7 mmol/L (ref 3.5–5.1)
Sodium: 138 mmol/L (ref 135–145)
Total Bilirubin: 0.4 mg/dL (ref 0.3–1.2)
Total Protein: 6.9 g/dL (ref 6.5–8.1)

## 2022-04-24 MED ORDER — PALONOSETRON HCL INJECTION 0.25 MG/5ML
0.2500 mg | Freq: Once | INTRAVENOUS | Status: AC
  Administered 2022-04-24: 0.25 mg via INTRAVENOUS
  Filled 2022-04-24: qty 5

## 2022-04-24 MED ORDER — SODIUM CHLORIDE 0.9 % IV SOLN: Freq: Once | INTRAVENOUS | Status: AC

## 2022-04-24 MED ORDER — SODIUM CHLORIDE 0.9% FLUSH
10.0000 mL | INTRAVENOUS | Status: DC | PRN
  Administered 2022-04-24: 10 mL

## 2022-04-24 MED ORDER — SODIUM CHLORIDE 0.9 % IV SOLN
150.0000 mg | Freq: Once | INTRAVENOUS | Status: AC
  Administered 2022-04-24: 150 mg via INTRAVENOUS
  Filled 2022-04-24: qty 150

## 2022-04-24 MED ORDER — SODIUM CHLORIDE 0.9% FLUSH
10.0000 mL | INTRAVENOUS | Status: DC | PRN
  Administered 2022-04-24: 10 mL via INTRAVENOUS

## 2022-04-24 MED ORDER — HEPARIN SOD (PORK) LOCK FLUSH 100 UNIT/ML IV SOLN
500.0000 [IU] | Freq: Once | INTRAVENOUS | Status: AC | PRN
  Administered 2022-04-24: 500 [IU]

## 2022-04-24 MED ORDER — SODIUM CHLORIDE 0.9 % IV SOLN
500.0000 mg/m2 | Freq: Once | INTRAVENOUS | Status: AC
  Administered 2022-04-24: 900 mg via INTRAVENOUS
  Filled 2022-04-24: qty 20

## 2022-04-24 MED ORDER — SODIUM CHLORIDE 0.9 % IV SOLN
10.0000 mg | Freq: Once | INTRAVENOUS | Status: AC
  Administered 2022-04-24: 10 mg via INTRAVENOUS
  Filled 2022-04-24: qty 10

## 2022-04-24 MED ORDER — SODIUM CHLORIDE 0.9 % IV SOLN
200.0000 mg | Freq: Once | INTRAVENOUS | Status: AC
  Administered 2022-04-24: 200 mg via INTRAVENOUS
  Filled 2022-04-24: qty 200

## 2022-04-24 MED ORDER — SODIUM CHLORIDE 0.9 % IV SOLN
364.4000 mg | Freq: Once | INTRAVENOUS | Status: AC
  Administered 2022-04-24: 360 mg via INTRAVENOUS
  Filled 2022-04-24: qty 36

## 2022-04-24 NOTE — Progress Notes (Signed)
[place]  Telephone:(336) [phone] Fax:(336) [phone]   Name: Aneta May Dollins Date: 04/24/2022 MRN: [id number]  DOB: 1956/01/09  Patient Care Team: Lorrene Reid, PA-C as PCP - General Roseanne Kaufman, MD as Consulting Physician (Orthopedic Surgery) Druscilla Brownie, MD as Consulting Physician (Dermatology) Dalton-Bethea, Fabio Asa, MD (Physical Medicine and Rehabilitation) Glenna Fellows, MD as Attending Physician (Neurosurgery) Shela Leff, MD as Resident (Internal Medicine) Armbruster, Carlota Raspberry, MD as Consulting Physician (Gastroenterology) Pickenpack-Cousar, Carlena Sax, NP as Nurse Practitioner (Nurse Practitioner)    INTERVAL HISTORY: Yocelyn May Gene is a 66 y.o. female with  medical history including recent diagnosis of lung cancer with concern for spinal mets, hypertension, fibromyalgia, depression, and hyperlipidemia.  Palliative ask to see for symptom management and goals of care.     SOCIAL HISTORY:     reports that she quit smoking about 19 years ago. Her smoking use included cigarettes. She has a 60.00 pack-year smoking history. She has never used smokeless tobacco. She reports that she does not currently use alcohol. She reports that she does not use drugs.  ADVANCE DIRECTIVES:    CODE STATUS:   PAST MEDICAL HISTORY: Past Medical History:  Diagnosis Date   Complication of anesthesia    "Hard to sedate", per pt and slow to wake up   COVID 10/2020   Degenerative joint disease    s/p cervical and lumbar fusions   Depression    pt denies   Encounter for preventive health examination    Next mammogram in 07/2009. Next colonoscopy in 09/2017. DEXA 4/09>Normal   Encounter for screening colonoscopy 10/03/2007   Normal   Fatty liver    Fibromyalgia    GERD (gastroesophageal reflux disease)    Hemorrhoid    hx of   History of hiatal hernia    Hyperlipidemia    Hypertension    Sciatica    Urinary tract infection  02/26/2022    ALLERGIES:  is allergic to codeine, morphine, beef-derived products, doxycycline, ibuprofen, iodinated contrast media, latex, nsaids, penicillins, pork-derived products, tramadol, and gabapentin.  MEDICATIONS:  Current Outpatient Medications  Medication Sig Dispense Refill   ALPRAZolam (XANAX) 0.5 MG tablet Take 1 tablet (0.5 mg total) by mouth at bedtime as needed for anxiety. 30 tablet 1   apixaban (ELIQUIS) 5 MG TABS tablet Take 1 tablet (5 mg total) by mouth 2 (two) times daily. 60 tablet 11   apixaban (ELIQUIS) 5 MG TABS tablet Take 1 tablet (5 mg total) by mouth 2 (two) times daily. 60 tablet 1   bisacodyl (DULCOLAX) 10 MG suppository Place 1 suppository (10 mg total) rectally daily as needed for severe constipation. 12 suppository 0   cefpodoxime (VANTIN) 100 MG tablet Take 1 tablet (100 mg total) by mouth 2 (two) times daily. Take for 7 days 14 tablet 0   celecoxib (CELEBREX) 200 MG capsule Take 1 capsule (200 mg total) by mouth daily. 30 capsule 0   dexamethasone (DECADRON) 2 MG tablet Take 1 tablet (2 mg total) by mouth daily. 30 tablet 0   dexamethasone (DECADRON) 4 MG tablet Take 1 tab 2 times daily starting day before pemetrexed. Then take 2 tabs daily x 3 days starting day after carboplatin. Take with food. 30 tablet 1   diphenhydrAMINE (BENADRYL) 25 MG tablet Take 25 mg by mouth daily as needed for allergies.     DULoxetine HCl 40 MG CPEP Take 40 mg by mouth daily. 30 capsule 2   ezetimibe (ZETIA)  10 MG tablet TAKE 1 TABLET (10 MG TOTAL) BY MOUTH DAILY. 90 tablet 0   folic acid (FOLVITE) 1 MG tablet Take 1 tablet (1 mg total) by mouth daily. Start 7 days before pemetrexed chemotherapy. Continue until 21 days after pemetrexed completed. 100 tablet 3   hydrochlorothiazide (HYDRODIURIL) 12.5 MG tablet TAKE 1 TABLET (12.5 MG TOTAL) BY MOUTH DAILY. 90 tablet 1   lidocaine (SALONPAS PAIN RELIEVING) 4 % Place 1 patch onto the skin daily. 30 patch 1   lidocaine-prilocaine  (EMLA) cream Apply to affected area once 30 g 3   Methocarbamol 1000 MG TABS Take 1,000 mg by mouth 3 (three) times daily. 90 tablet 0   Multiple Minerals-Vitamins (CAL MAG ZINC +D3 PO) Take 1 tablet by mouth 3 (three) times daily.     Multiple Vitamin (MULTIVITAMIN) capsule Take 1 capsule by mouth daily.     Nutritional Supplements (FEEDING SUPPLEMENT, KATE FARMS STANDARD 1.4,) LIQD liquid Take 325 mLs by mouth 2 (two) times daily between meals.     ondansetron (ZOFRAN) 8 MG tablet Take 1 tablet (8 mg total) by mouth every 8 (eight) hours as needed for nausea or vomiting. Start on the third day after carboplatin. 30 tablet 1   oxyCODONE ER (XTAMPZA ER) 13.5 MG C12A Take 1 tablet (13.5 mg) by mouth every 12 (twelve) hours. 60 capsule 0   Oxycodone HCl 20 MG TABS Take 1 - 2 tablets (20-40 mg total) by mouth every 4 hours as needed. 90 tablet 0   pantoprazole (PROTONIX) 20 MG tablet Take 1 tablet (20 mg total) by mouth 2 (two) times daily. 60 tablet 1   polyethylene glycol (MIRALAX / GLYCOLAX) 17 g packet Take 17 g by mouth 2 (two) times daily. 30 each 1   pregabalin (LYRICA) 50 MG capsule Take 1 capsule (50 mg total) by mouth 2 (two) times daily. 60 capsule 0   prochlorperazine (COMPAZINE) 10 MG tablet Take 1 tablet (10 mg total) by mouth every 6 (six) hours as needed for nausea or vomiting. 30 tablet 1   senna-docusate (SENOKOT-S) 8.6-50 MG tablet Take 2 tablets by mouth 2 (two) times daily. 120 tablet 1   tamsulosin (FLOMAX) 0.4 MG CAPS capsule Take 1 capsule (0.4 mg total) by mouth daily after breakfast. 30 capsule 1   Vitamin D, Ergocalciferol, (DRISDOL) 1.25 MG (50000 UNIT) CAPS capsule Take 1 capsule (50,000 Units total) by mouth every 7 (seven) days. 5 capsule 0   No current facility-administered medications for this visit.   Facility-Administered Medications Ordered in Other Visits  Medication Dose Route Frequency Provider Last Rate Last Admin   sodium chloride flush (NS) 0.9 % injection  10 mL  10 mL Intracatheter PRN Brunetta Genera, MD   10 mL at 04/24/22 1322    VITAL SIGNS: There were no vitals taken for this visit. There were no vitals filed for this visit.  Estimated body mass index is 29.05 kg/m as calculated from the following:   Height as of 04/06/22: 5\' 3"  (1.6 m).   Weight as of an earlier encounter on 04/24/22: 164 lb (74.4 kg).   PERFORMANCE STATUS (ECOG) : 2 - Symptomatic, <50% confined to bed   Physical Exam General: NAD, in recliner  Cardiovascular: regular rate and rhythm Pulmonary: normal breathing pattern  Extremities: no edema, no joint deformities Skin: no rashes Neurological: alert and oriented   IMPRESSION: I saw Ms. Myung during her infusion. No acute distress. Her daughter, Estill Bamberg is present. She engages in  and out during daughter and I discussions. Estill Bamberg shares patient has been much more alert and oriented over past several days. She was able to identify a plant in the infusion room that she also has at home.    Neoplasm related pain  Ms. Klammer's pain continues to improve and be well controlled. Daughter was concerned with patient's somnolence at times and need for medication. We decreased her Xtampa to 13.7 on 11/9.  We have been able to wean patient down from 54 mcg over the past 1-2 months.  She is tolerating well. Is taking Oxycodone IR 20 mg as needed. Daughter states she made attempts to decrease oxycodone however patient began complaining of pain.   Daughter understands we will continue to closely monitor.  No medication adjustments at this time.  and her daughter reports her pain has significantly improved. Unfortunately she was not able to obtain her pain medication for several days last week due to shortage. This did however allow for family to realize patient did not have to take as often.   Constipation  She is taking Miralax twice daily.  No concerns for constipation.  PLAN: Lyrica 50 mg twice daily Xtampza 13.7  mg every 12 hours Oxycodone 20 mg every 8 hours as needed (decreased from 20-40mg ) Robaxin 1000mg  as needed Celebrex 200 mg daily  Senna daily Miralax daily  We will continue to closely monitor and adjust. If pain continues to be well managed on revised regimen will plan to continue weaning.  I will plan to see patient back in 4-6 weeks.    Patient expressed understanding and was in agreement with this plan. She also understands that She can call the clinic at any time with any questions, concerns, or complaints.      Any controlled substances utilized were prescribed in the context of palliative care. PDMP has been reviewed.    Time Total: 35 min  Visit consisted of counseling and education dealing with the complex and emotionally intense issues of symptom management and palliative care in the setting of serious and potentially life-threatening illness.Greater than 50%  of this time was spent counseling and coordinating care related to the above assessment and plan.  Alda Lea, AGPCNP-BC  Palliative Medicine Team/[facility]

## 2022-04-24 NOTE — Patient Instructions (Signed)
[facility]   Discharge Instructions: Thank you for choosing [facility] to provide your oncology and hematology care.   If you have a lab appointment with the [facility], please go directly to the [facility] and check in at the registration area.   Wear comfortable clothing and clothing appropriate for easy access to any Portacath or PICC line.   We strive to give you quality time with your provider. You may need to reschedule your appointment if you arrive late (15 or more minutes).  Arriving late affects you and other patients whose appointments are after yours.  Also, if you miss three or more appointments without notifying the office, you may be dismissed from the clinic at the provider's discretion.      For prescription refill requests, have your pharmacy contact our office and allow 72 hours for refills to be completed.    Today you received the following chemotherapy and/or immunotherapy agents: pembrolizumab, pemetrexed, carboplatin      To help prevent nausea and vomiting after your treatment, we encourage you to take your nausea medication as directed.  BELOW ARE SYMPTOMS THAT SHOULD BE REPORTED IMMEDIATELY: *FEVER GREATER THAN 100.4 F (38 C) OR HIGHER *CHILLS OR SWEATING *NAUSEA AND VOMITING THAT IS NOT CONTROLLED WITH YOUR NAUSEA MEDICATION *UNUSUAL SHORTNESS OF BREATH *UNUSUAL BRUISING OR BLEEDING *URINARY PROBLEMS (pain or burning when urinating, or frequent urination) *BOWEL PROBLEMS (unusual diarrhea, constipation, pain near the anus) TENDERNESS IN MOUTH AND THROAT WITH OR WITHOUT PRESENCE OF ULCERS (sore throat, sores in mouth, or a toothache) UNUSUAL RASH, SWELLING OR PAIN  UNUSUAL VAGINAL DISCHARGE OR ITCHING   Items with * indicate a potential emergency and should be followed up as soon as possible or go to the Emergency Department if any problems should occur.  Please show the CHEMOTHERAPY ALERT CARD or  IMMUNOTHERAPY ALERT CARD at check-in to the Emergency Department and triage nurse.  Should you have questions after your visit or need to cancel or reschedule your appointment, please contact [facility]  Dept: [phone]  and follow the prompts.  Office hours are 8:00 a.m. to 4:30 p.m. Monday - Friday. Please note that voicemails left after 4:00 p.m. may not be returned until the following business day.  We are closed weekends and major holidays. You have access to a nurse at all times for urgent questions. Please call the main number to the clinic Dept: [phone] and follow the prompts.   For any non-urgent questions, you may also contact your provider using MyChart. We now offer e-Visits for anyone 37 and older to request care online for non-urgent symptoms. For details visit [email or web address].   Also download the MyChart app! Go to the app store, search "MyChart", open the app, select [facility], and log in with your MyChart username and password.  Masks are optional in the cancer centers. If you would like for your care team to wear a mask while they are taking care of you, please let them know. You may have one support person who is at least 66 years old accompany you for your appointments.

## 2022-04-28 ENCOUNTER — Other Ambulatory Visit: Payer: Self-pay | Admitting: Nurse Practitioner

## 2022-04-28 ENCOUNTER — Other Ambulatory Visit: Payer: Self-pay | Admitting: Physician Assistant

## 2022-04-28 DIAGNOSIS — Z515 Encounter for palliative care: Secondary | ICD-10-CM

## 2022-04-28 DIAGNOSIS — C3492 Malignant neoplasm of unspecified part of left bronchus or lung: Secondary | ICD-10-CM

## 2022-04-28 DIAGNOSIS — G893 Neoplasm related pain (acute) (chronic): Secondary | ICD-10-CM

## 2022-04-28 MED ORDER — PREGABALIN 50 MG PO CAPS: 50.0000 mg | ORAL_CAPSULE | Freq: Two times a day (BID) | ORAL | 0 refills | Status: AC

## 2022-04-28 MED ORDER — CELECOXIB 200 MG PO CAPS: 200.0000 mg | ORAL_CAPSULE | Freq: Every day | ORAL | 0 refills | Status: AC

## 2022-04-28 MED ORDER — METHOCARBAMOL 1000 MG PO TABS: 1000.0000 mg | ORAL_TABLET | Freq: Three times a day (TID) | ORAL | 0 refills | Status: AC

## 2022-04-28 NOTE — Telephone Encounter (Signed)
From: Kimberly May Gilardi To: Jobe Gibbon, NP Sent: 04/28/2022 6:35 AM EST Subject: Medication Renewal Request  Refills have been requested for the following medications:   celecoxib (CELEBREX) 200 MG capsule Misty Decker]   Methocarbamol 1000 MG TABS [Misty Decker]   pregabalin (LYRICA) 50 MG capsule Misty Decker]  Preferred pharmacy: [facility], [place] Delivery method: [employer]

## 2022-04-28 NOTE — Telephone Encounter (Signed)
Office visit required for future refills

## 2022-05-01 ENCOUNTER — Encounter: Payer: Self-pay | Admitting: Hematology

## 2022-05-01 NOTE — Progress Notes (Addendum)
HEMATOLOGY/ONCOLOGY CLINIC NOTE  Date of Service: 04/24/2022   Patient Care Team: Lorrene Reid, PA-C as PCP - General Roseanne Kaufman, MD as Consulting Physician (Orthopedic Surgery) Druscilla Brownie, MD as Consulting Physician (Dermatology) Dalton-Bethea, Fabio Asa, MD (Physical Medicine and Rehabilitation) Glenna Fellows, MD as Attending Physician (Neurosurgery) Shela Leff, MD as Resident (Internal Medicine) Armbruster, Carlota Raspberry, MD as Consulting Physician (Gastroenterology) Pickenpack-Cousar, Carlena Sax, NP as Nurse Practitioner (Nurse Practitioner)  CHIEF COMPLAINTS/PURPOSE OF CONSULTATION:  For continued evaluation and management of stage IV metastatic non-small cell lung cancer.   HISTORY OF PRESENTING ILLNESS: Please see previous note for details on initial presentation   Interval history  Misty Decker is here for continued evaluation and management of metastatic lung adenocarcinoma.  Patient's daughter is present for the clinic visit as well. She notes the patient has been eating better and has been complaining of less pain.  Also notes less confusion. Weight has been stable since her last clinic visit. No fevers no chills no night sweats no unexpected weight loss.. No new chest pain or shortness of breath.  No uncontrolled cough. Medication refill sent as requested. Labs done today were discussed in detail with the patient and her daughter.  MEDICAL HISTORY:  Past Medical History:  Diagnosis Date   Complication of anesthesia    "Hard to sedate", per pt and slow to wake up   COVID 10/2020   Degenerative joint disease    s/p cervical and lumbar fusions   Depression    pt denies   Encounter for preventive health examination    Next mammogram in 07/2009. Next colonoscopy in 09/2017. DEXA 4/09>Normal   Encounter for screening colonoscopy 10/03/2007   Normal   Fatty liver    Fibromyalgia    GERD (gastroesophageal reflux disease)    Hemorrhoid    hx  of   History of hiatal hernia    Hyperlipidemia    Hypertension    Sciatica    Urinary tract infection 02/26/2022    SURGICAL HISTORY: Past Surgical History:  Procedure Laterality Date   ABDOMINAL HYSTERECTOMY N/A    [facility] 11/21/2019   APPENDECTOMY N/A    [facility] 11/21/2019   BRONCHIAL NEEDLE ASPIRATION BIOPSY  01/06/2022   Procedure: BRONCHIAL NEEDLE ASPIRATION BIOPSIES;  Surgeon: Garner Nash, DO;  Location: [facility];  Service: Pulmonary;;   CERVICAL FUSION  2010   X 2   IR IMAGING GUIDED PORT INSERTION  03/04/2022   LUMBAR FUSION  1997   X 2/has plate and screws and cage   OVARIAN CYST SURGERY  1980   pelvic prolapse  1987   RECTAL PROLAPSE REPAIR     SPINE SURGERY N/A    [facility] 11/21/2019   TONSILLECTOMY     and adenoids   TOTAL ABDOMINAL HYSTERECTOMY  1987   VIDEO BRONCHOSCOPY WITH ENDOBRONCHIAL ULTRASOUND Left 01/06/2022   Procedure: VIDEO BRONCHOSCOPY WITH ENDOBRONCHIAL ULTRASOUND;  Surgeon: Garner Nash, DO;  Location: [facility];  Service: Pulmonary;  Laterality: Left;    SOCIAL HISTORY: Social History   Socioeconomic History   Marital status: Married    Spouse name: Not on file   Number of children: Not on file   Years of education: Not on file   Highest education level: Not on file  Occupational History   Not on file  Tobacco Use   Smoking status: Former    Packs/day: 2.00    Years: 30.00    Total pack years: 60.00    Types:  Cigarettes    Quit date: 04/03/2003    Years since quitting: 19.0   Smokeless tobacco: Never  Vaping Use   Vaping Use: Never used  Substance and Sexual Activity   Alcohol use: Not Currently    Alcohol/week: 0.0 standard drinks of alcohol   Drug use: No   Sexual activity: Not Currently  Other Topics Concern   Not on file  Social History Narrative   Not on file   Social Determinants of Health   Financial Resource Strain: Medium Risk (03/02/2021)   Overall Financial Resource Strain (CARDIA)    Difficulty  of Paying Living Expenses: Somewhat hard  Food Insecurity: No Food Insecurity (03/02/2021)   Hunger Vital Sign    Worried About Running Out of Food in the Last Year: Never true    Ran Out of Food in the Last Year: Never true  Transportation Needs: No Transportation Needs (03/02/2021)   PRAPARE - Hydrologist (Medical): No    Lack of Transportation (Non-Medical): No  Physical Activity: Inactive (03/02/2021)   Exercise Vital Sign    Days of Exercise per Week: 0 days    Minutes of Exercise per Session: 0 min  Stress: Stress Concern Present (03/02/2021)   [place]    Feeling of Stress : To some extent  Social Connections: Moderately Integrated (03/02/2021)   Social Connection and Isolation Panel [NHANES]    Frequency of Communication with Friends and Family: More than three times a week    Frequency of Social Gatherings with Friends and Family: More than three times a week    Attends Religious Services: More than 4 times per year    Active Member of [employer] or Organizations: No    Attends Archivist Meetings: Never    Marital Status: Married  Human resources officer Violence: Not At Risk (03/02/2021)   Humiliation, Afraid, Rape, and Kick questionnaire    Fear of Current or Ex-Partner: No    Emotionally Abused: No    Physically Abused: No    Sexually Abused: No    FAMILY HISTORY: Family History  Problem Relation Age of Onset   Cancer Mother    Diabetes Mother    Breast cancer Mother    Heart attack Mother    Hyperlipidemia Mother    Hyperlipidemia Father    Hypertension Father    Aneurysm Father    Dementia Maternal Grandfather    Cancer Paternal Grandmother        intestinal    ALLERGIES:  is allergic to codeine, morphine, beef-derived products, doxycycline, ibuprofen, iodinated contrast media, latex, nsaids, penicillins, pork-derived products, tramadol, and  gabapentin.  MEDICATIONS:  Current Outpatient Medications  Medication Sig Dispense Refill   ALPRAZolam (XANAX) 0.5 MG tablet Take 1 tablet (0.5 mg total) by mouth at bedtime as needed for anxiety. 30 tablet 1   apixaban (ELIQUIS) 5 MG TABS tablet Take 1 tablet (5 mg total) by mouth 2 (two) times daily. 60 tablet 11   apixaban (ELIQUIS) 5 MG TABS tablet Take 1 tablet (5 mg total) by mouth 2 (two) times daily. 60 tablet 1   bisacodyl (DULCOLAX) 10 MG suppository Place 1 suppository (10 mg total) rectally daily as needed for severe constipation. 12 suppository 0   cefpodoxime (VANTIN) 100 MG tablet Take 1 tablet (100 mg total) by mouth 2 (two) times daily. Take for 7 days 14 tablet 0   celecoxib (CELEBREX) 200 MG capsule Take 1  capsule (200 mg total) by mouth daily. 30 capsule 0   dexamethasone (DECADRON) 2 MG tablet Take 1 tablet (2 mg total) by mouth daily. 30 tablet 0   dexamethasone (DECADRON) 4 MG tablet Take 1 tab 2 times daily starting day before pemetrexed. Then take 2 tabs daily x 3 days starting day after carboplatin. Take with food. 30 tablet 1   diphenhydrAMINE (BENADRYL) 25 MG tablet Take 25 mg by mouth daily as needed for allergies.     DULoxetine HCl 40 MG CPEP Take 40 mg by mouth daily. 30 capsule 2   ezetimibe (ZETIA) 10 MG tablet TAKE 1 TABLET (10 MG TOTAL) BY MOUTH DAILY. 90 tablet 0   folic acid (FOLVITE) 1 MG tablet Take 1 tablet (1 mg total) by mouth daily. Start 7 days before pemetrexed chemotherapy. Continue until 21 days after pemetrexed completed. 100 tablet 3   hydrochlorothiazide (HYDRODIURIL) 12.5 MG tablet TAKE 1 TABLET (12.5 MG TOTAL) BY MOUTH DAILY. 90 tablet 1   lidocaine (SALONPAS PAIN RELIEVING) 4 % Place 1 patch onto the skin daily. 30 patch 1   lidocaine-prilocaine (EMLA) cream Apply to affected area once 30 g 3   Methocarbamol 1000 MG TABS Take 1,000 mg by mouth 3 (three) times daily. 90 tablet 0   Multiple Minerals-Vitamins (CAL MAG ZINC +D3 PO) Take 1 tablet  by mouth 3 (three) times daily.     Multiple Vitamin (MULTIVITAMIN) capsule Take 1 capsule by mouth daily.     Nutritional Supplements (FEEDING SUPPLEMENT, KATE FARMS STANDARD 1.4,) LIQD liquid Take 325 mLs by mouth 2 (two) times daily between meals.     ondansetron (ZOFRAN) 8 MG tablet Take 1 tablet (8 mg total) by mouth every 8 (eight) hours as needed for nausea or vomiting. Start on the third day after carboplatin. 30 tablet 1   oxyCODONE ER (XTAMPZA ER) 13.5 MG C12A Take 1 tablet (13.5 mg) by mouth every 12 (twelve) hours. 60 capsule 0   Oxycodone HCl 20 MG TABS Take 1 - 2 tablets (20-40 mg total) by mouth every 4 hours as needed. 90 tablet 0   pantoprazole (PROTONIX) 20 MG tablet Take 1 tablet (20 mg total) by mouth 2 (two) times daily. 60 tablet 1   polyethylene glycol (MIRALAX / GLYCOLAX) 17 g packet Take 17 g by mouth 2 (two) times daily. 30 each 1   pregabalin (LYRICA) 50 MG capsule Take 1 capsule (50 mg total) by mouth 2 (two) times daily. 60 capsule 0   prochlorperazine (COMPAZINE) 10 MG tablet Take 1 tablet (10 mg total) by mouth every 6 (six) hours as needed for nausea or vomiting. 30 tablet 1   senna-docusate (SENOKOT-S) 8.6-50 MG tablet Take 2 tablets by mouth 2 (two) times daily. 120 tablet 1   tamsulosin (FLOMAX) 0.4 MG CAPS capsule TAKE 1 CAPSULE BY MOUTH DAILY AFTER BREAKFAST. 30 capsule 0   Vitamin D, Ergocalciferol, (DRISDOL) 1.25 MG (50000 UNIT) CAPS capsule Take 1 capsule (50,000 Units total) by mouth every 7 (seven) days. 5 capsule 0   No current facility-administered medications for this visit.    REVIEW OF SYSTEMS:   10 Point review of Systems was done is negative except as noted above.  PHYSICAL EXAMINATION: ECOG PERFORMANCE STATUS: 1 - Symptomatic but completely ambulatory .BP 134/72 (BP Location: Right Arm, Patient Position: Sitting)   Pulse 92   Temp 98.2 F (36.8 C) (Temporal)   Resp 17   Wt 164 lb (74.4 kg)   SpO2 98%  BMI 29.05 kg/m   NAD GENERAL:alert, in no acute distress and comfortable SKIN: no acute rashes, no significant lesions EYES: conjunctiva are pink and non-injected, sclera anicteric OROPHARYNX: MMM, no exudates, no oropharyngeal erythema or ulceration NECK: supple, no JVD LYMPH:  no palpable lymphadenopathy in the cervical, axillary or inguinal regions LUNGS: clear to auscultation b/l with normal respiratory effort HEART: regular rate & rhythm ABDOMEN:  normoactive bowel sounds , non tender, not distended. Extremity: no pedal edema PSYCH: alert & oriented x 3 with fluent speech NEURO: no focal motor/sensory deficits   LABORATORY DATA:  I have reviewed the data as listed  .    Latest Ref Rng & Units 04/24/2022    9:57 AM 04/03/2022   11:25 AM 03/10/2022   12:19 PM  CBC  WBC 4.0 - 10.5 K/uL 3.5  3.7  3.7   Hemoglobin 12.0 - 15.0 g/dL 9.9  9.3  9.8   Hematocrit 36.0 - 46.0 % 30.4  28.0  29.9   Platelets 150 - 400 K/uL 522  324  445    .    Latest Ref Rng & Units 04/24/2022    9:57 AM 04/03/2022   11:25 AM 03/10/2022   12:19 PM  CMP  Glucose 70 - 99 mg/dL 161  129  127   BUN 8 - 23 mg/dL 8  11  10    Creatinine 0.44 - 1.00 mg/dL 0.38  0.43  0.47   Sodium 135 - 145 mmol/L 138  137  135   Potassium 3.5 - 5.1 mmol/L 3.7  3.8  3.9   Chloride 98 - 111 mmol/L 101  101  99   CO2 22 - 32 mmol/L 31  30  32   Calcium 8.9 - 10.3 mg/dL 8.8  8.3  8.5   Total Protein 6.5 - 8.1 g/dL 6.9  6.4  5.8   Total Bilirubin 0.3 - 1.2 mg/dL 0.4  0.5  0.5   Alkaline Phos 38 - 126 U/L 213  195  226   AST 15 - 41 U/L 40  44  34   ALT 0 - 44 U/L 13  13  17     GYN CYTOLOGY - NON PAP  CASE: [id number]  PATIENT: Hayleigh Pund  Non-Gynecological Cytology Report      Clinical History: Lung mass, adenopathy     FINAL MICROSCOPIC DIAGNOSIS:   A. LYMPH NODE, STATION 7, FINE NEEDLE ASPIRATION:  - Malignant cells consistent with adenocarcinoma, see comment       COMMENT:   Immunohistochemical stains  show that the tumor cells are negative for  TTF-1 and cytokeratin AE1/AE3 (membranous staining); while they are  negative for synaptophysin, CD56, p63 and CK5/6.  This immunoprofile is  consistent with above interpretation.  Dr. Arby Barrette reviewed the case  and concurs with the above diagnosis.  Dr. Valeta Harms was notified on  01/09/2022.        RADIOGRAPHIC STUDIES: I have personally reviewed the radiological images as listed and agreed with the findings in the report. EEG adult  Result Date: 04/20/2022       [facility] [place]. [place] [id number] [phone]      Electroencephalogram Procedure Note Ms. Vaughan Basta May Vankirk Date of Birth:  Jul 28, 1955 Medical Record Number:  [id number] Indications: Diagnostic Date of Procedure 04/14/2022 Medications: none Clinical history : 66 year old patient being evaluated for memory loss Technical Description This study was performed using 17 channel digital electroencephalographic recording equipment. International 10-20 electrode placement was  used. The record was obtained with the patient awake, drowsy, and asleep.  The record is of fair technical quality for purposes of interpretation. Activation Procedures:  photic stimulation . EEG Description Awake: Alpha Activity: The waking state record contains a well-defined bi-occipital alpha rhythm of  low amplitude with a dominant frequency of 7-8 Hz. Reactivity is uncertain. No paroxsymal activity, spikes, or sharp waves are noted.  Intermittent 6 hertz symmetric rhythmic theta range slowing is throughout the recording Technical component of study is adequate. EKG tracing shows regular sinus rhythm Length of this recording is 24 minutes and 23 seconds Sleep: With drowsiness, there is attenuation of the background alpha activity. As the patient enters into light sleep, vertex waves and symmetrical spindles are noted. K complexes are noted in sleep. Transition to the waking state is unremarkable.  Result of Activation Procedures: Hyperventilation: N/A. Photo Stimulation: No photic driving response is noted. Summary This is a mildly abnormal EEG due to the presence of bihemispheric slowing which is a nonspecific finding seen in a variety of conditions including degenerative, toxic, metabolic, ischemic etiologies are noted.  No definite epileptiform activity was noted.     ASSESSMENT & PLAN:   66 y.o. very pleasant female with  1. Stage IV metastatic non-small cell lung cancer, adenocarcinoma with extensive bone mets -12/26/2021 CT chest/abdomen/pelvis -left lower lobe mass measuring 5.3 x 4.1 x 4.5 cm, diffuse lytic and sclerotic lesions in the bones with possible pathologic fracture at L2 -01/05/2022 PET scan-left lower lobe lung mass intensely FDG avid, left upper lobe pulmonary nodule suspicious for pulmonary metastasis, FDG avid ipsilateral hilar, ipsilateral mediastinal, subcarinal, and contralateral right mediastinal nodal metastasis, widespread FDG avid lytic bone metastases with lytic lesion within the right proximal femur which may be of orthopedic significance. -01/08/2022 MRI brain with and without contrast-thrombus within the straight sinus, no evidence of intracranial metastatic disease.   2) posterior left iliac lesion/right distal inferior femoral neck, intertrochanteric, and lesser trochanteric lytic lesion at risk for pathologic fracture -01/09/2022 CT femur right-diffuse lytic metastases seen throughout the pelvis and proximal right femur, dominant right distal inferior femoral neck, intertrochanteric, and lesser trochanteric lytic lesion-increase his susceptibility for risk of pathologic fracture -01/09/2022 CT femur left-numerous lytic metastases within the pelvis, dominant partially visualized posterior left iliac lesion measuring 2.5 x 5 cm, smaller superolateral left acetabular 1.9 cm lytic lesion which abuts the lateral cortex without definite cortical breakthrough   3) thrombus  straight sinus -Seen by neurology who recommended full dose Lovenox x5 days followed by dabigatran 150 mg p.o. twice daily. -Patient continues to be on apixaban.  4) hypertension   5) hyperlipidemia   6) fibromyalgia   7) h/o cervical and lumbar spinal fusion with chronic back pain issues with L2 compression fracture  Plan Labs done today were discussed in detail with the patient CBC shows mild anemia with a hemoglobin of 9.9 with WBC count of 3.5k and platelets of 522k. CMP stable Patient reports no significant toxicities from cycle 3 of chemo-immunotherapy. Patient is appropriate to proceed with cycle 4 of carboplatin/Alimta/pembrolizumab chemoimmunotherapy Will continue same supportive medications. Continue Zometa monthly for bone metastasis. Chemoimmunotherapy and Zometa orders reviewed and placed..  Follow up: Follow-up and subsequent treatments as per integrated scheduling  The total time spent in the appointment was 32 minutes*.  All of the patient's questions were answered with apparent satisfaction. The patient knows to call the clinic with any problems, questions or concerns.   Sullivan Lone MD [place] AAHIVMS [facility]  [facility] Hematology/Oncology Physician [facility]  .*Total Encounter Time as defined by the Centers for Medicare and Medicaid Services includes, in addition to the face-to-face time of a patient visit (documented in the note above) non-face-to-face time: obtaining and reviewing outside history, ordering and reviewing medications, tests or procedures, care coordination (communications with other health care professionals or caregivers) and documentation in the medical record.

## 2022-05-14 ENCOUNTER — Other Ambulatory Visit (HOSPITAL_COMMUNITY): Payer: Medicare Other

## 2022-05-15 ENCOUNTER — Inpatient Hospital Stay (HOSPITAL_BASED_OUTPATIENT_CLINIC_OR_DEPARTMENT_OTHER): Payer: Medicare Other | Admitting: Nurse Practitioner

## 2022-05-15 ENCOUNTER — Other Ambulatory Visit: Payer: Self-pay | Admitting: Hematology

## 2022-05-15 ENCOUNTER — Encounter: Payer: Self-pay | Admitting: Nurse Practitioner

## 2022-05-15 ENCOUNTER — Inpatient Hospital Stay (HOSPITAL_BASED_OUTPATIENT_CLINIC_OR_DEPARTMENT_OTHER): Payer: Medicare Other | Admitting: Hematology

## 2022-05-15 ENCOUNTER — Other Ambulatory Visit: Payer: Self-pay

## 2022-05-15 ENCOUNTER — Inpatient Hospital Stay: Payer: Medicare Other | Attending: Hematology

## 2022-05-15 ENCOUNTER — Inpatient Hospital Stay: Payer: Medicare Other

## 2022-05-15 VITALS — BP 112/64 | HR 82 | Temp 98.1°F | Resp 17 | Ht 63.0 in | Wt 150.2 lb

## 2022-05-15 VITALS — BP 129/75 | HR 90 | Resp 18

## 2022-05-15 DIAGNOSIS — K5900 Constipation, unspecified: Secondary | ICD-10-CM

## 2022-05-15 DIAGNOSIS — Z79899 Other long term (current) drug therapy: Secondary | ICD-10-CM | POA: Insufficient documentation

## 2022-05-15 DIAGNOSIS — G893 Neoplasm related pain (acute) (chronic): Secondary | ICD-10-CM | POA: Diagnosis not present

## 2022-05-15 DIAGNOSIS — R530 Neoplastic (malignant) related fatigue: Secondary | ICD-10-CM | POA: Diagnosis not present

## 2022-05-15 DIAGNOSIS — R519 Headache, unspecified: Secondary | ICD-10-CM | POA: Diagnosis not present

## 2022-05-15 DIAGNOSIS — Z5112 Encounter for antineoplastic immunotherapy: Secondary | ICD-10-CM | POA: Diagnosis present

## 2022-05-15 DIAGNOSIS — Z7189 Other specified counseling: Secondary | ICD-10-CM

## 2022-05-15 DIAGNOSIS — F32A Depression, unspecified: Secondary | ICD-10-CM | POA: Diagnosis not present

## 2022-05-15 DIAGNOSIS — C3492 Malignant neoplasm of unspecified part of left bronchus or lung: Secondary | ICD-10-CM

## 2022-05-15 DIAGNOSIS — Z515 Encounter for palliative care: Secondary | ICD-10-CM | POA: Diagnosis not present

## 2022-05-15 DIAGNOSIS — Z5111 Encounter for antineoplastic chemotherapy: Secondary | ICD-10-CM | POA: Insufficient documentation

## 2022-05-15 DIAGNOSIS — Z7901 Long term (current) use of anticoagulants: Secondary | ICD-10-CM | POA: Diagnosis not present

## 2022-05-15 DIAGNOSIS — C7951 Secondary malignant neoplasm of bone: Secondary | ICD-10-CM | POA: Diagnosis not present

## 2022-05-15 DIAGNOSIS — M797 Fibromyalgia: Secondary | ICD-10-CM | POA: Diagnosis not present

## 2022-05-15 DIAGNOSIS — E785 Hyperlipidemia, unspecified: Secondary | ICD-10-CM | POA: Diagnosis not present

## 2022-05-15 DIAGNOSIS — C3432 Malignant neoplasm of lower lobe, left bronchus or lung: Secondary | ICD-10-CM | POA: Diagnosis present

## 2022-05-15 DIAGNOSIS — I10 Essential (primary) hypertension: Secondary | ICD-10-CM | POA: Insufficient documentation

## 2022-05-15 DIAGNOSIS — Z87891 Personal history of nicotine dependence: Secondary | ICD-10-CM | POA: Diagnosis not present

## 2022-05-15 DIAGNOSIS — C419 Malignant neoplasm of bone and articular cartilage, unspecified: Secondary | ICD-10-CM

## 2022-05-15 DIAGNOSIS — M792 Neuralgia and neuritis, unspecified: Secondary | ICD-10-CM

## 2022-05-15 DIAGNOSIS — Z95828 Presence of other vascular implants and grafts: Secondary | ICD-10-CM

## 2022-05-15 LAB — COMPREHENSIVE METABOLIC PANEL
ALT: 11 U/L (ref 0–44)
AST: 43 U/L — ABNORMAL HIGH (ref 15–41)
Albumin: 3.2 g/dL — ABNORMAL LOW (ref 3.5–5.0)
Alkaline Phosphatase: 198 U/L — ABNORMAL HIGH (ref 38–126)
Anion gap: 7 (ref 5–15)
BUN: 12 mg/dL (ref 8–23)
CO2: 29 mmol/L (ref 22–32)
Calcium: 8.7 mg/dL — ABNORMAL LOW (ref 8.9–10.3)
Chloride: 102 mmol/L (ref 98–111)
Creatinine, Ser: 0.45 mg/dL (ref 0.44–1.00)
GFR, Estimated: >60 mL/min (ref >60)
Glucose, Bld: 157 mg/dL — ABNORMAL HIGH (ref 70–99)
Potassium: 3.7 mmol/L (ref 3.5–5.1)
Sodium: 138 mmol/L (ref 135–145)
Total Bilirubin: 0.4 mg/dL (ref 0.3–1.2)
Total Protein: 5.9 g/dL — ABNORMAL LOW (ref 6.5–8.1)

## 2022-05-15 LAB — CBC WITH DIFFERENTIAL/PLATELET
Abs Immature Granulocytes: 0.25 10*3/uL — ABNORMAL HIGH (ref 0.00–0.07)
Basophils Absolute: 0 10*3/uL (ref 0.0–0.1)
Basophils Relative: 1 %
Eosinophils Absolute: 0 10*3/uL (ref 0.0–0.5)
Eosinophils Relative: 0 %
HCT: 28.2 % — ABNORMAL LOW (ref 36.0–46.0)
Hemoglobin: 8.7 g/dL — ABNORMAL LOW (ref 12.0–15.0)
Immature Granulocytes: 6 %
Lymphocytes Relative: 20 %
Lymphs Abs: 0.8 10*3/uL (ref 0.7–4.0)
MCH: 31.6 pg (ref 26.0–34.0)
MCHC: 30.9 g/dL (ref 30.0–36.0)
MCV: 102.5 fL — ABNORMAL HIGH (ref 80.0–100.0)
Monocytes Absolute: 0.7 10*3/uL (ref 0.1–1.0)
Monocytes Relative: 18 %
Neutro Abs: 2.3 10*3/uL (ref 1.7–7.7)
Neutrophils Relative %: 55 %
Platelets: 432 10*3/uL — ABNORMAL HIGH (ref 150–400)
RBC: 2.75 MIL/uL — ABNORMAL LOW (ref 3.87–5.11)
RDW: 18.2 % — ABNORMAL HIGH (ref 11.5–15.5)
Smear Review: NORMAL
WBC: 4.1 10*3/uL (ref 4.0–10.5)
nRBC: 0.7 % — ABNORMAL HIGH (ref 0.0–0.2)

## 2022-05-15 MED ORDER — SODIUM CHLORIDE 0.9% FLUSH
10.0000 mL | INTRAVENOUS | Status: DC | PRN
  Administered 2022-05-15: 10 mL via INTRAVENOUS

## 2022-05-15 MED ORDER — SODIUM CHLORIDE 0.9 % IV SOLN
500.0000 mg/m2 | Freq: Once | INTRAVENOUS | Status: AC
  Administered 2022-05-15: 900 mg via INTRAVENOUS
  Filled 2022-05-15: qty 20

## 2022-05-15 MED ORDER — PROCHLORPERAZINE MALEATE 10 MG PO TABS
10.0000 mg | ORAL_TABLET | Freq: Once | ORAL | Status: AC
  Administered 2022-05-15: 10 mg via ORAL
  Filled 2022-05-15: qty 1

## 2022-05-15 MED ORDER — SODIUM CHLORIDE 0.9% FLUSH
10.0000 mL | INTRAVENOUS | Status: DC | PRN
  Administered 2022-05-15: 10 mL

## 2022-05-15 MED ORDER — ZOLEDRONIC ACID 4 MG/100ML IV SOLN
4.0000 mg | Freq: Once | INTRAVENOUS | Status: AC
  Administered 2022-05-15: 4 mg via INTRAVENOUS
  Filled 2022-05-15: qty 100

## 2022-05-15 MED ORDER — SODIUM CHLORIDE 0.9 % IV SOLN: Freq: Once | INTRAVENOUS | Status: AC

## 2022-05-15 MED ORDER — SODIUM CHLORIDE 0.9 % IV SOLN
200.0000 mg | Freq: Once | INTRAVENOUS | Status: AC
  Administered 2022-05-15: 200 mg via INTRAVENOUS
  Filled 2022-05-15: qty 200

## 2022-05-15 NOTE — Progress Notes (Signed)
[place]  [phone] [phone]   Name: [name] Date: 05/15/2022 MRN: [id number]  DOB: 1956-02-21  Patient Care Team: Lorrene Reid, PA-C as PCP - General Roseanne Kaufman, MD as Consulting Physician (Orthopedic Surgery) Druscilla Brownie, MD as Consulting Physician (Dermatology) Dalton-Bethea, Fabio Asa, MD (Physical Medicine and Rehabilitation) Glenna Fellows, MD as Attending Physician (Neurosurgery) Shela Leff, MD as Resident (Internal Medicine) Armbruster, Carlota Raspberry, MD as Consulting Physician (Gastroenterology) Pickenpack-Cousar, Carlena Sax, NP as Nurse Practitioner (Nurse Practitioner)    INTERVAL HISTORY: Misty Decker is a 66 y.o. female with  medical history including recent diagnosis of lung cancer with concern for spinal mets, hypertension, fibromyalgia, depression, and hyperlipidemia.  Palliative ask to see for symptom management and goals of care.     SOCIAL HISTORY:     reports that she quit smoking about 19 years ago. Her smoking use included cigarettes. She has a 60.00 pack-year smoking history. She has never used smokeless tobacco. She reports that she does not currently use alcohol. She reports that she does not use drugs.  ADVANCE DIRECTIVES:    CODE STATUS:   PAST MEDICAL HISTORY: Past Medical History:  Diagnosis Date   Complication of anesthesia    "Hard to sedate", per pt and slow to wake up   COVID 10/2020   Degenerative joint disease    s/p cervical and lumbar fusions   Depression    pt denies   Encounter for preventive health examination    Next mammogram in 07/2009. Next colonoscopy in 09/2017. DEXA 4/09>Normal   Encounter for screening colonoscopy 10/03/2007   Normal   Fatty liver    Fibromyalgia    GERD (gastroesophageal reflux disease)    Hemorrhoid    hx of   History of hiatal hernia    Hyperlipidemia    Hypertension    Sciatica    Urinary tract infection  02/26/2022    ALLERGIES:  is allergic to codeine, morphine, beef-derived products, doxycycline, ibuprofen, iodinated contrast media, latex, nsaids, penicillins, pork-derived products, tramadol, and gabapentin.  MEDICATIONS:  Current Outpatient Medications  Medication Sig Dispense Refill   ALPRAZolam (XANAX) 0.5 MG tablet Take 1 tablet (0.5 mg total) by mouth at bedtime as needed for anxiety. 30 tablet 1   apixaban (ELIQUIS) 5 MG TABS tablet Take 1 tablet (5 mg total) by mouth 2 (two) times daily. 60 tablet 11   apixaban (ELIQUIS) 5 MG TABS tablet Take 1 tablet (5 mg total) by mouth 2 (two) times daily. 60 tablet 1   bisacodyl (DULCOLAX) 10 MG suppository Place 1 suppository (10 mg total) rectally daily as needed for severe constipation. 12 suppository 0   cefpodoxime (VANTIN) 100 MG tablet Take 1 tablet (100 mg total) by mouth 2 (two) times daily. Take for 7 days 14 tablet 0   celecoxib (CELEBREX) 200 MG capsule Take 1 capsule (200 mg total) by mouth daily. 30 capsule 0   dexamethasone (DECADRON) 2 MG tablet Take 1 tablet (2 mg total) by mouth daily. 30 tablet 0   dexamethasone (DECADRON) 4 MG tablet Take 1 tab 2 times daily starting day before pemetrexed. Then take 2 tabs daily x 3 days starting day after carboplatin. Take with food. 30 tablet 1   diphenhydrAMINE (BENADRYL) 25 MG tablet Take 25 mg by mouth daily as needed for allergies.     DULoxetine HCl 40 MG CPEP Take 40 mg by mouth daily. 30 capsule 2   ezetimibe (ZETIA)  10 MG tablet TAKE 1 TABLET (10 MG TOTAL) BY MOUTH DAILY. 90 tablet 0   folic acid (FOLVITE) 1 MG tablet Take 1 tablet (1 mg total) by mouth daily. Start 7 days before pemetrexed chemotherapy. Continue until 21 days after pemetrexed completed. 100 tablet 3   hydrochlorothiazide (HYDRODIURIL) 12.5 MG tablet TAKE 1 TABLET (12.5 MG TOTAL) BY MOUTH DAILY. 90 tablet 1   lidocaine (SALONPAS PAIN RELIEVING) 4 % Place 1 patch onto the skin daily. 30 patch 1   lidocaine-prilocaine  (EMLA) cream Apply to affected area once 30 g 3   Methocarbamol 1000 MG TABS Take 1,000 mg by mouth 3 (three) times daily. 90 tablet 0   Multiple Minerals-Vitamins (CAL MAG ZINC +D3 PO) Take 1 tablet by mouth 3 (three) times daily.     Multiple Vitamin (MULTIVITAMIN) capsule Take 1 capsule by mouth daily.     Nutritional Supplements (FEEDING SUPPLEMENT, KATE FARMS STANDARD 1.4,) LIQD liquid Take 325 mLs by mouth 2 (two) times daily between meals.     ondansetron (ZOFRAN) 8 MG tablet Take 1 tablet (8 mg total) by mouth every 8 (eight) hours as needed for nausea or vomiting. Start on the third day after carboplatin. 30 tablet 1   oxyCODONE ER (XTAMPZA ER) 13.5 MG C12A Take 1 tablet (13.5 mg) by mouth every 12 (twelve) hours. 60 capsule 0   Oxycodone HCl 20 MG TABS Take 1 - 2 tablets (20-40 mg total) by mouth every 4 hours as needed. 90 tablet 0   pantoprazole (PROTONIX) 20 MG tablet Take 1 tablet (20 mg total) by mouth 2 (two) times daily. 60 tablet 1   polyethylene glycol (MIRALAX / GLYCOLAX) 17 g packet Take 17 g by mouth 2 (two) times daily. 30 each 1   pregabalin (LYRICA) 50 MG capsule Take 1 capsule (50 mg total) by mouth 2 (two) times daily. 60 capsule 0   prochlorperazine (COMPAZINE) 10 MG tablet Take 1 tablet (10 mg total) by mouth every 6 (six) hours as needed for nausea or vomiting. 30 tablet 1   senna-docusate (SENOKOT-S) 8.6-50 MG tablet Take 2 tablets by mouth 2 (two) times daily. 120 tablet 1   tamsulosin (FLOMAX) 0.4 MG CAPS capsule TAKE 1 CAPSULE BY MOUTH DAILY AFTER BREAKFAST. 30 capsule 0   Vitamin D, Ergocalciferol, (DRISDOL) 1.25 MG (50000 UNIT) CAPS capsule Take 1 capsule (50,000 Units total) by mouth every 7 (seven) days. 5 capsule 0   No current facility-administered medications for this visit.   Facility-Administered Medications Ordered in Other Visits  Medication Dose Route Frequency Provider Last Rate Last Admin   pembrolizumab (KEYTRUDA) 200 mg in sodium chloride 0.9 % 50  mL chemo infusion  200 mg Intravenous Once Brunetta Genera, MD       PEMEtrexed (ALIMTA) 900 mg in sodium chloride 0.9 % 100 mL chemo infusion  500 mg/m2 (Treatment Plan Recorded) Intravenous Once Brunetta Genera, MD       prochlorperazine (COMPAZINE) tablet 10 mg  10 mg Oral Once Brunetta Genera, MD       sodium chloride flush (NS) 0.9 % injection 10 mL  10 mL Intracatheter PRN Brunetta Genera, MD       Zoledronic Acid (ZOMETA) IVPB 4 mg  4 mg Intravenous Once Brunetta Genera, MD        VITAL SIGNS: There were no vitals taken for this visit. There were no vitals filed for this visit.  Estimated body mass index is 26.61 kg/m as  calculated from the following:   Height as of an earlier encounter on 05/15/22: 5\' 3"  (1.6 m).   Weight as of an earlier encounter on 05/15/22: 150 lb 3.2 oz (68.1 kg).   PERFORMANCE STATUS (ECOG) : 2 - Symptomatic, <50% confined to bed   Physical Exam General: NAD, in wheelchair  Cardiovascular: regular rate and rhythm Pulmonary: normal breathing pattern  Extremities: no edema, no joint deformities Skin: no rashes Neurological: alert and oriented   IMPRESSION:  Misty Decker presents to clinic today for follow-up. Her daughters are with her. No acute distress. Today is her birthday and she is looking forward to having dinner with her family after her treatment today. She continues to do as well as expected which she and her family is appreciative of.   Neoplasm related pain  Ms. Brawley's pain continues to improve and be well controlled. We decreased her Xtampa to 13.7 twice daily on 11/9. Given improvement she has been able to wean down to at bedtime only. Daughter states she has not complained of increased pain since making changes. We have been able to wean patient down from 54 mcg over the past 1-2 months.  She is tolerating well. Is taking Oxycodone IR 20 mg as needed. Daughter states she made attempts to decrease oxycodone however  patient began complaining of pain. Taking 1 tablet daily.   Her robaxin has been weaned down to 500 mg as needed. She is taking three times daily. Education provided to family to decrease down to once daily removing midday dose given need is greater at bedtime and first thing in the mornings.   Daughter understands we will continue to closely monitor.  No medication adjustments at this time.   Constipation  She is taking Miralax twice daily.  Constipation also much improved. Her appetite fluctuates as her taste buds have changed with treatment. Family is preparing meals that she would enjoy.   PLAN: Lyrica 50 mg twice daily Xtampza 13.7 mg at bedtime. Decreased from twice daily.  Oxycodone 20 mg every 8 hours as needed (decreased from 20-40mg ). Taking 1 tab daily.  Robaxin decreased to 500 mg as needed.  Celebrex 200 mg daily  Senna daily Miralax daily  We will continue to closely monitor and adjust. If pain continues to be well managed on revised regimen will plan to continue weaning.  I will plan to see patient back in 4-6 weeks.    Patient expressed understanding and was in agreement with this plan. She also understands that She can call the clinic at any time with any questions, concerns, or complaints.     Any controlled substances utilized were prescribed in the context of palliative care. PDMP has been reviewed.    Time Total: 25 min   Visit consisted of counseling and education dealing with the complex and emotionally intense issues of symptom management and palliative care in the setting of serious and potentially life-threatening illness.Greater than 50%  of this time was spent counseling and coordinating care related to the above assessment and plan.  Alda Lea, AGPCNP-BC  Palliative Medicine Team/[facility]

## 2022-05-15 NOTE — Progress Notes (Signed)
HEMATOLOGY/ONCOLOGY CLINIC NOTE  Date of Service: 05/15/22    Patient Care Team: Lorrene Reid, PA-C as PCP - General Roseanne Kaufman, MD as Consulting Physician (Orthopedic Surgery) Druscilla Brownie, MD as Consulting Physician (Dermatology) Dalton-Bethea, Fabio Asa, MD (Physical Medicine and Rehabilitation) Glenna Fellows, MD as Attending Physician (Neurosurgery) Shela Leff, MD as Resident (Internal Medicine) Armbruster, Carlota Raspberry, MD as Consulting Physician (Gastroenterology) Pickenpack-Cousar, Carlena Sax, NP as Nurse Practitioner (Nurse Practitioner)  CHIEF COMPLAINTS/PURPOSE OF CONSULTATION:  For continued evaluation and management of stage IV metastatic non-small cell lung cancer.   HISTORY OF PRESENTING ILLNESS: Please see previous note for details on initial presentation   [place] is here for continued evaluation and management of metastatic lung adenocarcinoma.    She was doing better at her last visit with me on 04/24/22. She noted improved appetite and less pain and confusion at that time.  She is due for cycle 5 of carboplatin/Alimta/pembrolizumab chemoimmunotherapy today. She continues Zometa monthly for bone metastasis. No significant toxicities.  Patient's daughters are present for the clinic visit as well. Her fatigue waxes and wanes. She feels her worst for the first 5-10 days after treatment but will bounce back in the week prior to next treatment. She feels that she did better with her last treatment than previous cycles.  Pt's daughter states that the pt pulled her foley cath out accidentally while changing positions in bed, so she has been using diapers. She denies any urinary changes or retention. There was no injury or bleeding with this.  Pt has been experiencing severe frontal headaches every other day for the last week. Her pain typically starts in the evening an hour or so before she is due for her medications. She denies  any sinus pressure/pain or nasal congestion. She notes occasional eye pain. Her daughter notes that the pt rarely wears her glasses. She has some discomfort with palpation of frontal sinuses. Pt denies any history of migraines. She denies any new visual changes or focal weakness.  She reports having 1 pressure sore on each ankle. Her family suspects that these are pressure sores. She does have home health care.  She states that she continues to have aching pain in her left hip and legs. She has reduced her Oxycodone use due to her daughter's concern for her confusion. She does have persistent mild to moderate pain but her confusion has significantly improve.  She reports minimal appetite. She is drinking 3 boosts as day if she does not eat anything else that day. Otherwise, she will drink 2 boosts a day if she does eat. Her weight has decreased 14 pounds over last 1 month .  She denies signs of infection such as sore throat, sinus drainage, or cough. She denies any bowel changes. She denies fevers or recurrent chills. She denies nausea, vomiting, chest pain, dyspnea or cough.   She uses her walker for walking short distances and her wheelchair   Labs done today were discussed in detail with the patient and her daughters.  MEDICAL HISTORY:  Past Medical History:  Diagnosis Date   Complication of anesthesia    "Hard to sedate", per pt and slow to wake up   COVID 10/2020   Degenerative joint disease    s/p cervical and lumbar fusions   Depression    pt denies   Encounter for preventive health examination    Next mammogram in 07/2009. Next colonoscopy in 09/2017. DEXA 4/09>Normal   Encounter for  screening colonoscopy 10/03/2007   Normal   Fatty liver    Fibromyalgia    GERD (gastroesophageal reflux disease)    Hemorrhoid    hx of   History of hiatal hernia    Hyperlipidemia    Hypertension    Sciatica    Urinary tract infection 02/26/2022    SURGICAL HISTORY: Past Surgical  History:  Procedure Laterality Date   ABDOMINAL HYSTERECTOMY N/A    [facility] 11/21/2019   APPENDECTOMY N/A    [facility] 11/21/2019   BRONCHIAL NEEDLE ASPIRATION BIOPSY  01/06/2022   Procedure: BRONCHIAL NEEDLE ASPIRATION BIOPSIES;  Surgeon: Garner Nash, DO;  Location: [facility];  Service: Pulmonary;;   CERVICAL FUSION  2010   X 2   IR IMAGING GUIDED PORT INSERTION  03/04/2022   LUMBAR FUSION  1997   X 2/has plate and screws and cage   OVARIAN CYST SURGERY  1980   pelvic prolapse  1987   RECTAL PROLAPSE REPAIR     SPINE SURGERY N/A    [facility] 11/21/2019   TONSILLECTOMY     and adenoids   TOTAL ABDOMINAL HYSTERECTOMY  1987   VIDEO BRONCHOSCOPY WITH ENDOBRONCHIAL ULTRASOUND Left 01/06/2022   Procedure: VIDEO BRONCHOSCOPY WITH ENDOBRONCHIAL ULTRASOUND;  Surgeon: Garner Nash, DO;  Location: [facility];  Service: Pulmonary;  Laterality: Left;    SOCIAL HISTORY: Social History   Socioeconomic History   Marital status: Married    Spouse name: Not on file   Number of children: Not on file   Years of education: Not on file   Highest education level: Not on file  Occupational History   Not on file  Tobacco Use   Smoking status: Former    Packs/day: 2.00    Years: 30.00    Total pack years: 60.00    Types: Cigarettes    Quit date: 04/03/2003    Years since quitting: 19.1   Smokeless tobacco: Never  Vaping Use   Vaping Use: Never used  Substance and Sexual Activity   Alcohol use: Not Currently    Alcohol/week: 0.0 standard drinks of alcohol   Drug use: No   Sexual activity: Not Currently  Other Topics Concern   Not on file  Social History Narrative   Not on file   Social Determinants of Health   Financial Resource Strain: Medium Risk (03/02/2021)   Overall Financial Resource Strain (CARDIA)    Difficulty of Paying Living Expenses: Somewhat hard  Food Insecurity: No Food Insecurity (03/02/2021)   Hunger Vital Sign    Worried About Running Out of Food in the  Last Year: Never true    Ran Out of Food in the Last Year: Never true  Transportation Needs: No Transportation Needs (03/02/2021)   PRAPARE - Hydrologist (Medical): No    Lack of Transportation (Non-Medical): No  Physical Activity: Inactive (03/02/2021)   Exercise Vital Sign    Days of Exercise per Week: 0 days    Minutes of Exercise per Session: 0 min  Stress: Stress Concern Present (03/02/2021)   [place]    Feeling of Stress : To some extent  Social Connections: Moderately Integrated (03/02/2021)   Social Connection and Isolation Panel [NHANES]    Frequency of Communication with Friends and Family: More than three times a week    Frequency of Social Gatherings with Friends and Family: More than three times a week    Attends Religious Services:  More than 4 times per year    Active Member of Clubs or Organizations: No    Attends Archivist Meetings: Never    Marital Status: Married  Human resources officer Violence: Not At Risk (03/02/2021)   Humiliation, Afraid, Rape, and Kick questionnaire    Fear of Current or Ex-Partner: No    Emotionally Abused: No    Physically Abused: No    Sexually Abused: No    FAMILY HISTORY: Family History  Problem Relation Age of Onset   Cancer Mother    Diabetes Mother    Breast cancer Mother    Heart attack Mother    Hyperlipidemia Mother    Hyperlipidemia Father    Hypertension Father    Aneurysm Father    Dementia Maternal Grandfather    Cancer Paternal Grandmother        intestinal    ALLERGIES:  is allergic to codeine, morphine, beef-derived products, doxycycline, ibuprofen, iodinated contrast media, latex, nsaids, penicillins, pork-derived products, tramadol, and gabapentin.  MEDICATIONS:  Current Outpatient Medications  Medication Sig Dispense Refill   ALPRAZolam (XANAX) 0.5 MG tablet Take 1 tablet (0.5 mg total) by mouth at bedtime as  needed for anxiety. 30 tablet 1   apixaban (ELIQUIS) 5 MG TABS tablet Take 1 tablet (5 mg total) by mouth 2 (two) times daily. 60 tablet 11   apixaban (ELIQUIS) 5 MG TABS tablet Take 1 tablet (5 mg total) by mouth 2 (two) times daily. 60 tablet 1   bisacodyl (DULCOLAX) 10 MG suppository Place 1 suppository (10 mg total) rectally daily as needed for severe constipation. 12 suppository 0   cefpodoxime (VANTIN) 100 MG tablet Take 1 tablet (100 mg total) by mouth 2 (two) times daily. Take for 7 days 14 tablet 0   celecoxib (CELEBREX) 200 MG capsule Take 1 capsule (200 mg total) by mouth daily. 30 capsule 0   dexamethasone (DECADRON) 2 MG tablet Take 1 tablet (2 mg total) by mouth daily. 30 tablet 0   dexamethasone (DECADRON) 4 MG tablet Take 1 tab 2 times daily starting day before pemetrexed. Then take 2 tabs daily x 3 days starting day after carboplatin. Take with food. 30 tablet 1   diphenhydrAMINE (BENADRYL) 25 MG tablet Take 25 mg by mouth daily as needed for allergies.     DULoxetine HCl 40 MG CPEP Take 40 mg by mouth daily. 30 capsule 2   ezetimibe (ZETIA) 10 MG tablet TAKE 1 TABLET (10 MG TOTAL) BY MOUTH DAILY. 90 tablet 0   folic acid (FOLVITE) 1 MG tablet Take 1 tablet (1 mg total) by mouth daily. Start 7 days before pemetrexed chemotherapy. Continue until 21 days after pemetrexed completed. 100 tablet 3   hydrochlorothiazide (HYDRODIURIL) 12.5 MG tablet TAKE 1 TABLET (12.5 MG TOTAL) BY MOUTH DAILY. 90 tablet 1   lidocaine (SALONPAS PAIN RELIEVING) 4 % Place 1 patch onto the skin daily. 30 patch 1   lidocaine-prilocaine (EMLA) cream Apply to affected area once 30 g 3   Methocarbamol 1000 MG TABS Take 1,000 mg by mouth 3 (three) times daily. 90 tablet 0   Multiple Minerals-Vitamins (CAL MAG ZINC +D3 PO) Take 1 tablet by mouth 3 (three) times daily.     Multiple Vitamin (MULTIVITAMIN) capsule Take 1 capsule by mouth daily.     Nutritional Supplements (FEEDING SUPPLEMENT, KATE FARMS STANDARD  1.4,) LIQD liquid Take 325 mLs by mouth 2 (two) times daily between meals.     ondansetron (ZOFRAN) 8 MG tablet Take  1 tablet (8 mg total) by mouth every 8 (eight) hours as needed for nausea or vomiting. Start on the third day after carboplatin. 30 tablet 1   oxyCODONE ER (XTAMPZA ER) 13.5 MG C12A Take 1 tablet (13.5 mg) by mouth every 12 (twelve) hours. 60 capsule 0   Oxycodone HCl 20 MG TABS Take 1 - 2 tablets (20-40 mg total) by mouth every 4 hours as needed. 90 tablet 0   pantoprazole (PROTONIX) 20 MG tablet Take 1 tablet (20 mg total) by mouth 2 (two) times daily. 60 tablet 1   polyethylene glycol (MIRALAX / GLYCOLAX) 17 g packet Take 17 g by mouth 2 (two) times daily. 30 each 1   pregabalin (LYRICA) 50 MG capsule Take 1 capsule (50 mg total) by mouth 2 (two) times daily. 60 capsule 0   prochlorperazine (COMPAZINE) 10 MG tablet Take 1 tablet (10 mg total) by mouth every 6 (six) hours as needed for nausea or vomiting. 30 tablet 1   senna-docusate (SENOKOT-S) 8.6-50 MG tablet Take 2 tablets by mouth 2 (two) times daily. 120 tablet 1   tamsulosin (FLOMAX) 0.4 MG CAPS capsule TAKE 1 CAPSULE BY MOUTH DAILY AFTER BREAKFAST. 30 capsule 0   Vitamin D, Ergocalciferol, (DRISDOL) 1.25 MG (50000 UNIT) CAPS capsule Take 1 capsule (50,000 Units total) by mouth every 7 (seven) days. 5 capsule 0   No current facility-administered medications for this visit.    REVIEW OF SYSTEMS:   10 Point review of Systems was done is negative except as noted above.  PHYSICAL EXAMINATION: ECOG PERFORMANCE STATUS: 1 - Symptomatic but completely ambulatory .BP 112/64 (BP Location: Left Arm, Patient Position: Sitting)   Pulse 82   Temp 98.1 F (36.7 C) (Temporal)   Resp 17   Ht 5\' 3"  (1.6 m)   Wt 150 lb 3.2 oz (68.1 kg)   SpO2 96%   BMI 26.61 kg/m  NAD GENERAL:alert, in no acute distress and comfortable SKIN: no acute rashes, no significant lesions EYES: conjunctiva are pink and non-injected, sclera  anicteric OROPHARYNX: MMM, no exudates, no oropharyngeal erythema or ulceration NECK: supple, no JVD LYMPH:  no palpable lymphadenopathy in the cervical, axillary or inguinal regions LUNGS: clear to auscultation b/l with normal respiratory effort HEART: regular rate & rhythm ABDOMEN:  normoactive bowel sounds , non tender, not distended. Extremity: no pedal edema PSYCH: alert & oriented x 3 with fluent speech NEURO: no focal motor/sensory deficits   LABORATORY DATA:  I have reviewed the data as listed  .    Latest Ref Rng & Units 05/15/2022   11:12 AM 04/24/2022    9:57 AM 04/03/2022   11:25 AM  CBC  WBC 4.0 - 10.5 K/uL 4.1  3.5  3.7   Hemoglobin 12.0 - 15.0 g/dL 8.7  9.9  9.3   Hematocrit 36.0 - 46.0 % 28.2  30.4  28.0   Platelets 150 - 400 K/uL 432  522  324    .    Latest Ref Rng & Units 04/24/2022    9:57 AM 04/03/2022   11:25 AM 03/10/2022   12:19 PM  CMP  Glucose 70 - 99 mg/dL 161  129  127   BUN 8 - 23 mg/dL 8  11  10    Creatinine 0.44 - 1.00 mg/dL 0.38  0.43  0.47   Sodium 135 - 145 mmol/L 138  137  135   Potassium 3.5 - 5.1 mmol/L 3.7  3.8  3.9   Chloride 98 - 111  mmol/L 101  101  99   CO2 22 - 32 mmol/L 31  30  32   Calcium 8.9 - 10.3 mg/dL 8.8  8.3  8.5   Total Protein 6.5 - 8.1 g/dL 6.9  6.4  5.8   Total Bilirubin 0.3 - 1.2 mg/dL 0.4  0.5  0.5   Alkaline Phos 38 - 126 U/L 213  195  226   AST 15 - 41 U/L 40  44  34   ALT 0 - 44 U/L 13  13  17     GYN CYTOLOGY - NON PAP  CASE: [id number]  PATIENT: Margaree Cicero  Non-Gynecological Cytology Report      Clinical History: Lung mass, adenopathy     FINAL MICROSCOPIC DIAGNOSIS:   A. LYMPH NODE, STATION 7, FINE NEEDLE ASPIRATION:  - Malignant cells consistent with adenocarcinoma, see comment       COMMENT:   Immunohistochemical stains show that the tumor cells are negative for  TTF-1 and cytokeratin AE1/AE3 (membranous staining); while they are  negative for synaptophysin, CD56, p63 and  CK5/6.  This immunoprofile is  consistent with above interpretation.  Dr. Arby Barrette reviewed the case  and concurs with the above diagnosis.  Dr. Valeta Harms was notified on  01/09/2022.        RADIOGRAPHIC STUDIES: I have personally reviewed the radiological images as listed and agreed with the findings in the report. EEG adult  Result Date: 04/20/2022       [facility] [place] [place] [address] [phone]      Electroencephalogram Procedure Note Ms. Vaughan Basta May Sparacino Date of Birth:  [birth year] Medical Record Number:  [id number] Indications: Diagnostic Date of Procedure 04/14/2022 Medications: none Clinical history : 66 year old patient being evaluated for memory loss Technical Description This study was performed using 17 channel digital electroencephalographic recording equipment. International 10-20 electrode placement was used. The record was obtained with the patient awake, drowsy, and asleep.  The record is of fair technical quality for purposes of interpretation. Activation Procedures:  photic stimulation . EEG Description Awake: Alpha Activity: The waking state record contains a well-defined bi-occipital alpha rhythm of  low amplitude with a dominant frequency of 7-8 Hz. Reactivity is uncertain. No paroxsymal activity, spikes, or sharp waves are noted.  Intermittent 6 hertz symmetric rhythmic theta range slowing is throughout the recording Technical component of study is adequate. EKG tracing shows regular sinus rhythm Length of this recording is 24 minutes and 23 seconds Sleep: With drowsiness, there is attenuation of the background alpha activity. As the patient enters into light sleep, vertex waves and symmetrical spindles are noted. K complexes are noted in sleep. Transition to the waking state is unremarkable. Result of Activation Procedures: Hyperventilation: N/A. Photo Stimulation: No photic driving response is noted. Summary This is a mildly abnormal EEG due to  the presence of bihemispheric slowing which is a nonspecific finding seen in a variety of conditions including degenerative, toxic, metabolic, ischemic etiologies are noted.  No definite epileptiform activity was noted.    MRI brain 01/08/2022: IMPRESSION: Thrombus within the straight sinus. No evidence of intracranial metastatic disease.  NM PET 01/05/22: IMPRESSION: 1. Left lower lobe lung mass is intensely FDG avid compatible with primary bronchogenic carcinoma. 2. Left upper lobe pulmonary nodule suspicious for pulmonary metastasis. 3. FDG avid ipsilateral hilar, ipsilateral mediastinal, subcarinal, and contralateral right mediastinal nodal metastasis. 4. Widespread FDG avid lytic bone metastasis. Lytic lesion within the proximal right femur may be of orthopedic significance.  5. Part solid nodule with low level FDG uptake is identified within the apical segment of the left upper lobe. Cannot exclude indolent pulmonary neoplasm such as adenocarcinoma. 6. Tiny nodule in the left adrenal gland is FDG avid. Early left adrenal metastasis cannot be excluded.  ASSESSMENT & PLAN:   66 y.o. very pleasant female with  1. Stage IV metastatic non-small cell lung cancer, adenocarcinoma with extensive bone mets -12/26/2021 CT chest/abdomen/pelvis -left lower lobe mass measuring 5.3 x 4.1 x 4.5 cm, diffuse lytic and sclerotic lesions in the bones with possible pathologic fracture at L2 -01/05/2022 PET scan-left lower lobe lung mass intensely FDG avid, left upper lobe pulmonary nodule suspicious for pulmonary metastasis, FDG avid ipsilateral hilar, ipsilateral mediastinal, subcarinal, and contralateral right mediastinal nodal metastasis, widespread FDG avid lytic bone metastases with lytic lesion within the right proximal femur which may be of orthopedic significance. -01/08/2022 MRI brain with and without contrast-thrombus within the straight sinus, no evidence of intracranial metastatic disease.   2)  posterior left iliac lesion/right distal inferior femoral neck, intertrochanteric, and lesser trochanteric lytic lesion at risk for pathologic fracture -01/09/2022 CT femur right-diffuse lytic metastases seen throughout the pelvis and proximal right femur, dominant right distal inferior femoral neck, intertrochanteric, and lesser trochanteric lytic lesion-increase his susceptibility for risk of pathologic fracture -01/09/2022 CT femur left-numerous lytic metastases within the pelvis, dominant partially visualized posterior left iliac lesion measuring 2.5 x 5 cm, smaller superolateral left acetabular 1.9 cm lytic lesion which abuts the lateral cortex without definite cortical breakthrough   3) thrombus straight sinus -Seen by neurology who recommended full dose Lovenox x5 days followed by dabigatran 150 mg p.o. twice daily. -Patient continues to be on apixaban.  4) hypertension   5) hyperlipidemia   6) fibromyalgia   7) h/o cervical and lumbar spinal fusion with chronic back pain issues with L2 compression fracture  PLAN: -Labs done today were discussed in detail with the patient CBC shows mild anemia with a hemoglobin of 8.7 with WBC count of 4.1K and platelets of 432K. CMP stable. -Patient reports no significant toxicities from cycle 4 of chemo-immunotherapy. -Patient is appropriate to proceed with cycle 5 of carboplatin/Alimta/pembrolizumab chemoimmunotherapy -Will continue same supportive medications. -Continue Zometa monthly for bone metastasis. -Chemoimmunotherapy and Zometa orders reviewed and placed. -Will order CT CAP and NM PET. -MRI from 01/2022 showed thrombus within the straight sinus. She is on chronic anticoagulation. -Given her new headaches, we will also repeat a MRI brain.  FOLLOW UP: Follow-up and subsequent treatments as per integrated scheduling  The total time spent in the appointment was 30 minutes* .  All of the patient's questions were answered with apparent  satisfaction. The patient knows to call the clinic with any problems, questions or concerns.  I,Alexis Herring,acting as a Education administrator for Sullivan Lone, MD.,have documented all relevant documentation on the behalf of Sullivan Lone, MD,as directed by  Sullivan Lone, MD while in the presence of Sullivan Lone, MD.  .I have reviewed the above documentation for accuracy and completeness, and I agree with the above.   Sullivan Lone MD MS AAHIVMS [facility] Hematology/Oncology Physician [facility]  .*Total Encounter Time as defined by the Centers for Medicare and Medicaid Services includes, in addition to the face-to-face time of a patient visit (documented in the note above) non-face-to-face time: obtaining and reviewing outside history, ordering and reviewing medications, tests or procedures, care coordination (communications with other health care professionals or caregivers) and documentation in the medical record.

## 2022-05-15 NOTE — Patient Instructions (Signed)
[facility] ONCOLOGY  Discharge Instructions: Thank you for choosing [facility] to provide your oncology and hematology care.   If you have a lab appointment with the [facility], please go directly to the [facility] and check in at the registration area.   Wear comfortable clothing and clothing appropriate for easy access to any Portacath or PICC line.   We strive to give you quality time with your provider. You may need to reschedule your appointment if you arrive late (15 or more minutes).  Arriving late affects you and other patients whose appointments are after yours.  Also, if you miss three or more appointments without notifying the office, you may be dismissed from the clinic at the provider's discretion.      For prescription refill requests, have your pharmacy contact our office and allow 72 hours for refills to be completed.    Today you received the following chemotherapy and/or immunotherapy agents: pembrolizumab and pemetrexed      To help prevent nausea and vomiting after your treatment, we encourage you to take your nausea medication as directed.  BELOW ARE SYMPTOMS THAT SHOULD BE REPORTED IMMEDIATELY: *FEVER GREATER THAN 100.4 F (38 C) OR HIGHER *CHILLS OR SWEATING *NAUSEA AND VOMITING THAT IS NOT CONTROLLED WITH YOUR NAUSEA MEDICATION *UNUSUAL SHORTNESS OF BREATH *UNUSUAL BRUISING OR BLEEDING *URINARY PROBLEMS (pain or burning when urinating, or frequent urination) *BOWEL PROBLEMS (unusual diarrhea, constipation, pain near the anus) TENDERNESS IN MOUTH AND THROAT WITH OR WITHOUT PRESENCE OF ULCERS (sore throat, sores in mouth, or a toothache) UNUSUAL RASH, SWELLING OR PAIN  UNUSUAL VAGINAL DISCHARGE OR ITCHING   Items with * indicate a potential emergency and should be followed up as soon as possible or go to the Emergency Department if any problems should occur.  Please show the CHEMOTHERAPY ALERT CARD or IMMUNOTHERAPY ALERT  CARD at check-in to the Emergency Department and triage nurse.  Should you have questions after your visit or need to cancel or reschedule your appointment, please contact [facility]  Dept: [phone]  and follow the prompts.  Office hours are 8:00 a.m. to 4:30 p.m. Monday - Friday. Please note that voicemails left after 4:00 p.m. may not be returned until the following business day.  We are closed weekends and major holidays. You have access to a nurse at all times for urgent questions. Please call the main number to the clinic Dept: [phone] and follow the prompts.   For any non-urgent questions, you may also contact your provider using MyChart. We now offer e-Visits for anyone 70 and older to request care online for non-urgent symptoms. For details visit [email or web address].   Also download the MyChart app! Go to the app store, search "MyChart", open the app, select , and log in with your MyChart username and password.  Masks are optional in the cancer centers. If you would like for your care team to wear a mask while they are taking care of you, please let them know. You may have one support person who is at least 66 years old accompany you for your appointments.

## 2022-05-15 NOTE — Progress Notes (Signed)
Patient seen by MD today  Vitals are within treatment parameters.  Labs reviewed: and are within treatment parameters."  Per physician team, patient is ready for treatment and there are NO modifications to the treatment plan.

## 2022-05-22 ENCOUNTER — Encounter: Payer: Self-pay | Admitting: Hematology

## 2022-05-28 ENCOUNTER — Ambulatory Visit (HOSPITAL_COMMUNITY): Admission: RE | Admit: 2022-05-28 | Payer: Medicare Other | Source: Ambulatory Visit

## 2022-06-05 ENCOUNTER — Inpatient Hospital Stay: Payer: Medicare Other

## 2022-06-05 ENCOUNTER — Inpatient Hospital Stay: Payer: Medicare Other | Admitting: Hematology

## 2022-06-08 DEATH — deceased

## 2022-06-26 ENCOUNTER — Ambulatory Visit: Payer: Medicare Other

## 2022-06-26 ENCOUNTER — Other Ambulatory Visit: Payer: Medicare Other

## 2022-06-26 ENCOUNTER — Ambulatory Visit: Payer: Medicare Other | Admitting: Hematology

## 2022-07-17 ENCOUNTER — Other Ambulatory Visit: Payer: Medicare Other

## 2022-07-17 ENCOUNTER — Ambulatory Visit: Payer: Medicare Other | Admitting: Hematology

## 2022-07-17 ENCOUNTER — Ambulatory Visit: Payer: Medicare Other

## 2022-08-07 ENCOUNTER — Other Ambulatory Visit: Payer: Medicare Other

## 2022-08-07 ENCOUNTER — Ambulatory Visit: Payer: Medicare Other | Admitting: Hematology

## 2022-08-07 ENCOUNTER — Ambulatory Visit: Payer: Medicare Other

## 2022-10-15 ENCOUNTER — Ambulatory Visit: Payer: Medicare Other | Admitting: Neurology

## 2024-04-21 ENCOUNTER — Other Ambulatory Visit (HOSPITAL_COMMUNITY): Payer: Self-pay
